# Patient Record
Sex: Female | Born: 1950 | Race: Black or African American | Hispanic: No | Smoking: Never smoker
Health system: Southern US, Community
[De-identification: ages and names within clinical notes are randomized; demographics above are authoritative.]

## PROBLEM LIST (undated history)

## (undated) DIAGNOSIS — E7800 Pure hypercholesterolemia, unspecified: Secondary | ICD-10-CM

## (undated) DIAGNOSIS — D219 Benign neoplasm of connective and other soft tissue, unspecified: Secondary | ICD-10-CM

## (undated) DIAGNOSIS — I10 Essential (primary) hypertension: Secondary | ICD-10-CM

## (undated) DIAGNOSIS — R7303 Prediabetes: Secondary | ICD-10-CM

## (undated) DIAGNOSIS — N950 Postmenopausal bleeding: Secondary | ICD-10-CM

## (undated) DIAGNOSIS — M1990 Unspecified osteoarthritis, unspecified site: Secondary | ICD-10-CM

## (undated) DIAGNOSIS — C801 Malignant (primary) neoplasm, unspecified: Secondary | ICD-10-CM

## (undated) DIAGNOSIS — C539 Malignant neoplasm of cervix uteri, unspecified: Secondary | ICD-10-CM

## (undated) HISTORY — PX: TUBAL LIGATION: SHX77

## (undated) HISTORY — DX: Pure hypercholesterolemia, unspecified: E78.00

## (undated) HISTORY — DX: Benign neoplasm of connective and other soft tissue, unspecified: D21.9

## (undated) HISTORY — DX: Malignant neoplasm of cervix uteri, unspecified: C53.9

## (undated) HISTORY — DX: Essential (primary) hypertension: I10

## (undated) HISTORY — DX: Postmenopausal bleeding: N95.0

## (undated) HISTORY — PX: REPLACEMENT TOTAL KNEE BILATERAL: SUR1225

## (undated) MED FILL — Fosaprepitant Dimeglumine For IV Infusion 150 MG (Base Eq): INTRAVENOUS | Qty: 5 | Status: AC

---

## 2008-12-03 ENCOUNTER — Emergency Department: Payer: Self-pay | Admitting: Unknown Physician Specialty

## 2008-12-04 ENCOUNTER — Ambulatory Visit: Payer: Self-pay

## 2008-12-21 ENCOUNTER — Ambulatory Visit: Payer: Self-pay

## 2009-02-20 ENCOUNTER — Ambulatory Visit: Payer: Self-pay | Admitting: Gastroenterology

## 2010-12-01 IMAGING — CR DG LUMBAR SPINE AP/LAT/OBLIQUES W/ FLEX AND EXT
1 series · 5 of 5 positions shown · non-contrast
Comparison: none

REASON FOR EXAM: low back pain
COMMENTS:

[Series 1: view not recorded · 0.17mm/px · 5 of 5 slices shown]
[im 1/5]
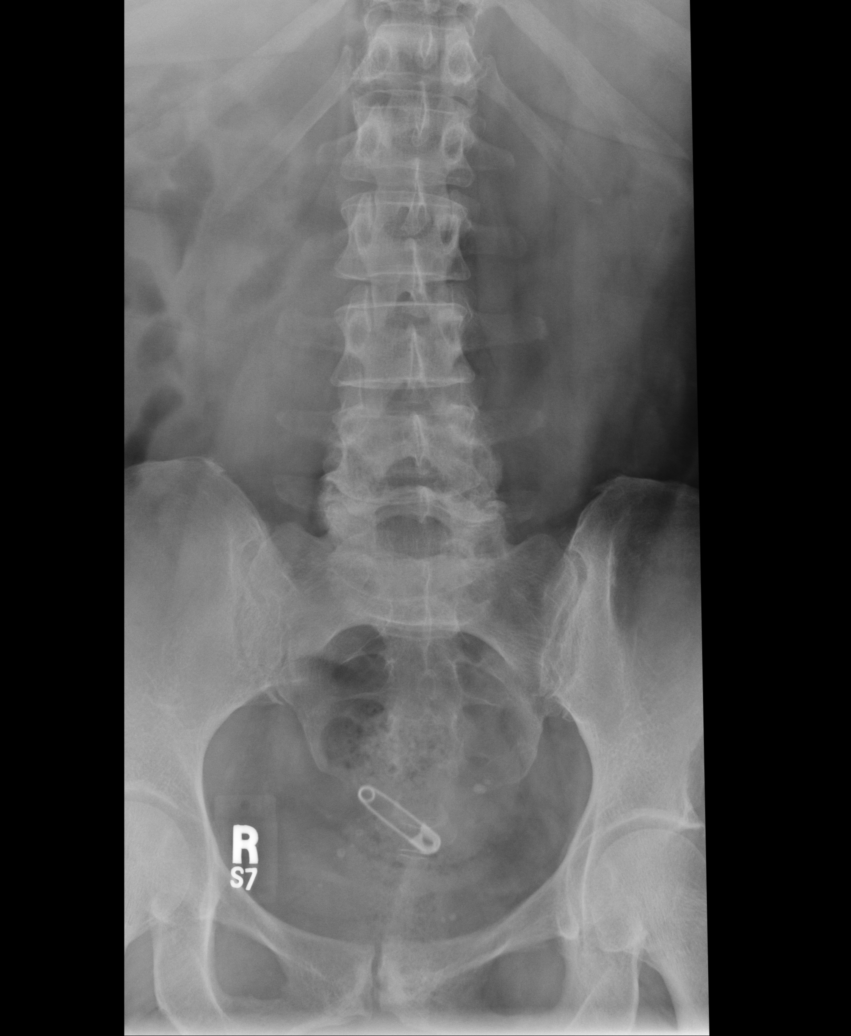
[im 2/5]
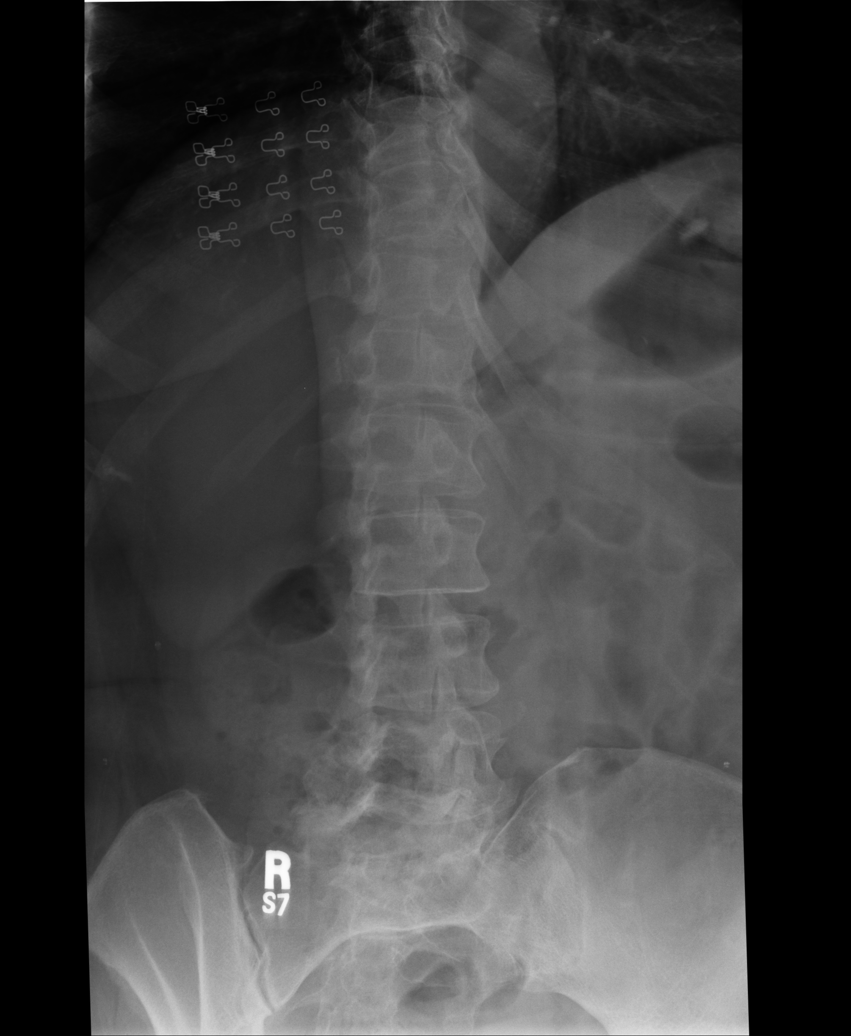
[im 3/5]
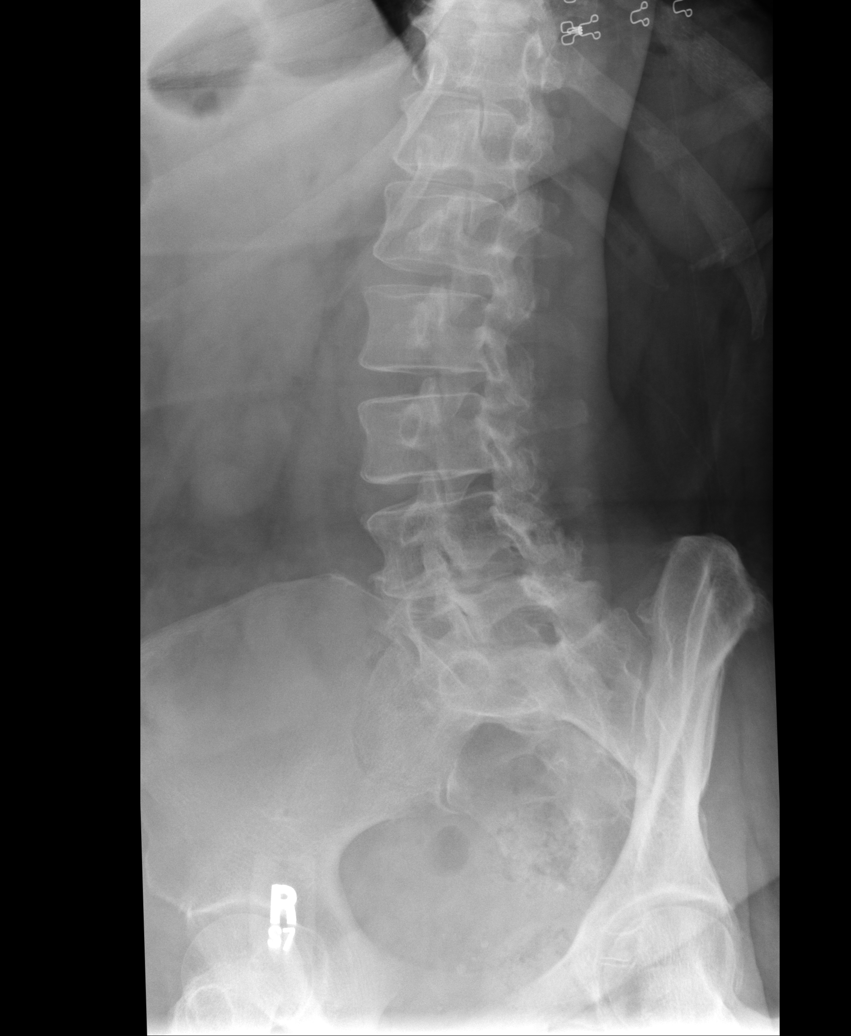
[im 4/5]
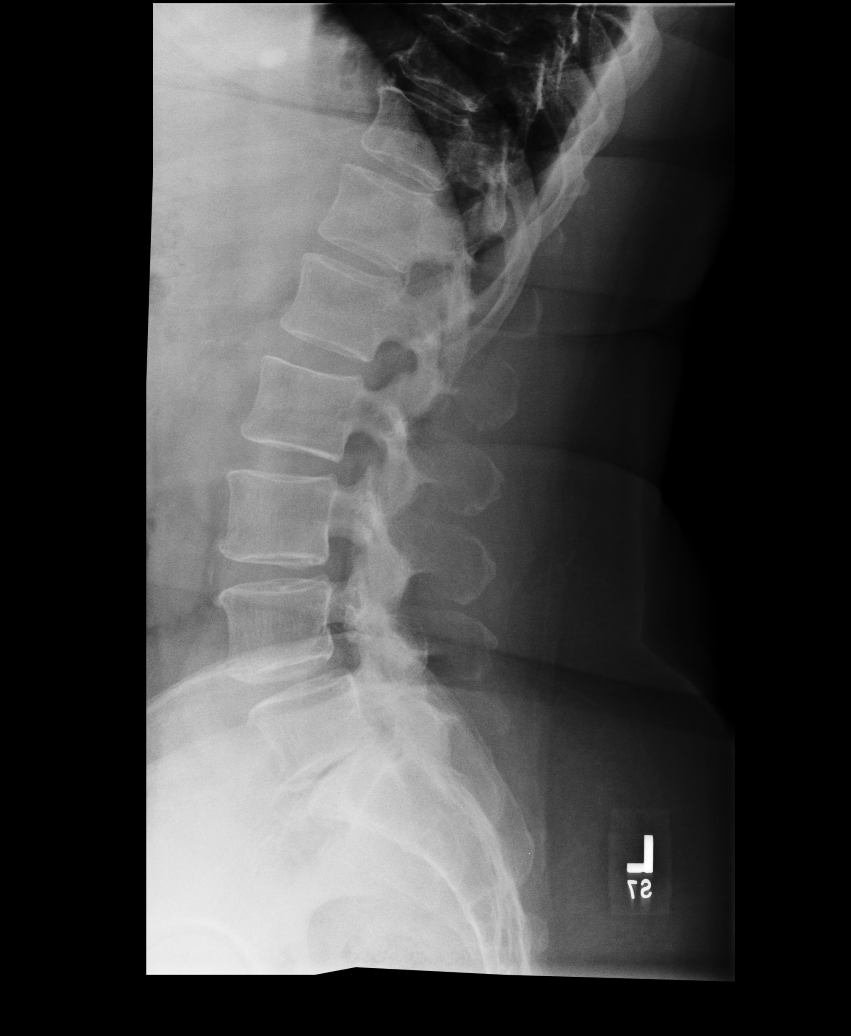
[im 5/5]
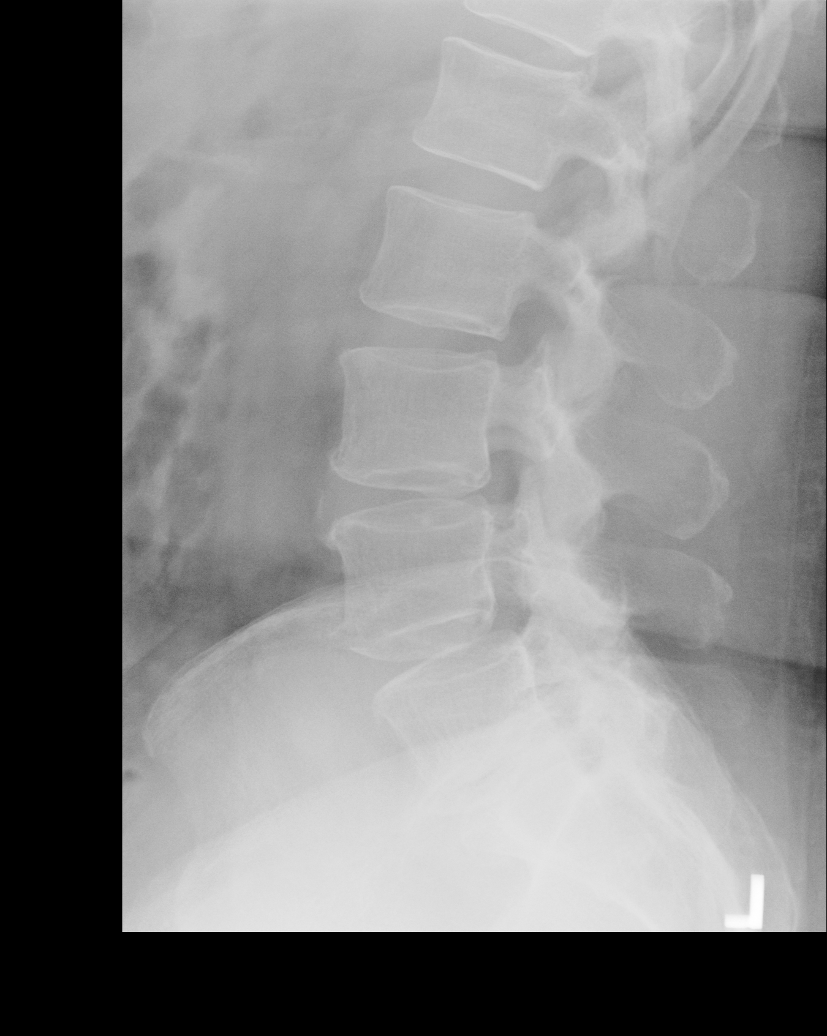

[5 of 5 positions shown; findings below may reference images not displayed]

PROCEDURE:     DXR - DXR LUMBAR SPINE WITH OBLIQUES  - December 04, 2008 [DATE]

RESULT:     The lumbar vertebral bodies are preserved in height. The lumbar
intervertebral disc space heights are reasonably well-maintained. There is
no evidence of spondylolisthesis. The pedicles and transverse processes
appear intact. As best as can be determined no pars defects are present.
Mild degenerative facet joint change at L4-L5 and L5-S1 is present. Mild
narrowing of the L5-S1 disc space is present.
IMPRESSION: I do not see objective evidence of an acute fracture of the
lumbar spine.

## 2017-09-02 DIAGNOSIS — M1711 Unilateral primary osteoarthritis, right knee: Secondary | ICD-10-CM | POA: Insufficient documentation

## 2017-09-14 DIAGNOSIS — T8484XA Pain due to internal orthopedic prosthetic devices, implants and grafts, initial encounter: Secondary | ICD-10-CM | POA: Insufficient documentation

## 2018-03-29 DIAGNOSIS — M1A0710 Idiopathic chronic gout, right ankle and foot, without tophus (tophi): Secondary | ICD-10-CM | POA: Insufficient documentation

## 2018-03-29 DIAGNOSIS — I10 Essential (primary) hypertension: Secondary | ICD-10-CM | POA: Insufficient documentation

## 2020-02-10 DIAGNOSIS — Z9289 Personal history of other medical treatment: Secondary | ICD-10-CM

## 2020-02-10 HISTORY — DX: Personal history of other medical treatment: Z92.89

## 2020-11-08 DIAGNOSIS — E7800 Pure hypercholesterolemia, unspecified: Secondary | ICD-10-CM | POA: Insufficient documentation

## 2020-11-08 DIAGNOSIS — R7303 Prediabetes: Secondary | ICD-10-CM | POA: Insufficient documentation

## 2020-12-23 DIAGNOSIS — Z1211 Encounter for screening for malignant neoplasm of colon: Secondary | ICD-10-CM | POA: Insufficient documentation

## 2021-08-11 HISTORY — PX: HYSTEROSCOPY WITH D & C: SHX1775

## 2021-08-11 HISTORY — PX: EXAMINATION UNDER ANESTHESIA: SHX1540

## 2021-08-19 ENCOUNTER — Telehealth: Payer: Self-pay | Admitting: *Deleted

## 2021-08-19 NOTE — Telephone Encounter (Signed)
Spoke with the patient's daughter and gave the appt date/time for 8/4. Explained that the office will call and move her appt up to either 7/13 or the week of 7/17.    Explained to the patient the the doctor will perform a pelvic exam at this visit. Patient given the policy that no visitors under the 16 yrs are a loud in the [place]. Patient given the address/phone number for the clinic and that the center offers free valet service.

## 2021-08-20 ENCOUNTER — Telehealth: Payer: Self-pay

## 2021-08-20 ENCOUNTER — Encounter: Payer: Self-pay | Admitting: Gynecologic Oncology

## 2021-08-20 NOTE — Telephone Encounter (Signed)
Received phone call from patients daughter confirming her upcoming appointment. Advised her that there is an appointment available at 08:15am tomorrow 08/21/21 with Dr. Berline Lopes. She is in agreement of appointment date and time. She has office address and phone and instructed to call with any needs.

## 2021-08-21 ENCOUNTER — Ambulatory Visit: Payer: Self-pay | Admitting: Gynecologic Oncology

## 2021-08-21 NOTE — H&P (View-Only) (Signed)
GYNECOLOGIC ONCOLOGY NEW PATIENT CONSULTATION   Patient Name: Sarah Wu  Patient Age: 71 y.o. Date of Service: 08/22/2021 Referring Provider: Luanne Bras [address] [place],  [place] [address]   Primary Care Provider: Tilden Fossa, MD Consulting Provider: Jeral Pinch, MD   Assessment/Plan:  Postmenopausal patient with carcinosarcoma.  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  Exam findings today are consistent with a prolapsing mass through the cervix.  We reviewed the nature of endometrial cancer and its recommended surgical staging, including total hysterectomy, bilateral salpingo-oophorectomy, and lymph node assessment. The patient is a suitable candidate for staging via a minimally invasive approach to surgery.  We reviewed that robotic assistance would be used to complete the surgery.   We discussed that most endometrial cancer is detected early, however, we reviewed that adjuvant therapy will likely be recommended based on the patient's biopsy, however, we will defer to final pathology results.    Given her high risk histology, I recommend CT scan preoperatively to rule out metastatic disease.  Given her difficult exam, I have also recommended pelvic ultrasound to better assess the size of the uterus and this prolapsing mass.  We reviewed the sentinel lymph node technique. Risks and benefits of sentinel lymph node biopsy was reviewed. We reviewed the technique and ICG dye. The patient DOES NOT have an iodine allergy or known liver dysfunction. We reviewed the false negative rate (0.4%), and that 3% of patients with metastatic disease will not have it detected by SLN biopsy in endometrial cancer. A low risk of allergic reaction to the dye, <0.2% for ICG, has been reported. We also discussed that in the case of failed mapping, which occurs 40% of the time, a bilateral or unilateral lymphadenectomy will be performed at the  surgeon's discretion.   Potential benefits of sentinel nodes including a higher detection rate for metastasis due to ultrastaging and potential reduction in operative morbidity. However, there remains uncertainty as to the role for treatment of micrometastatic disease. Further, the benefit of operative morbidity associated with the SLN technique in endometrial cancer is not yet completely known. In other patient populations (e.g. the cervical cancer population) there has been observed reductions in morbidity with SLN biopsy compared to pelvic lymphadenectomy. Lymphedema, nerve dysfunction and lymphocysts are all potential risks with the SLN technique as with complete lymphadenectomy. Additional risks to the patient include the risk of damage to an internal organ while operating in an altered view (e.g. the black and white image of the robotic fluorescence imaging mode).   We discussed the plan for a robotic assisted hysterectomy, bilateral salpingo-oophorectomy, sentinel lymph node evaluation, possible lymph node dissection, possible laparotomy. The risks of surgery were discussed in detail and she understands these to include infection; wound separation; hernia; vaginal cuff separation, injury to adjacent organs such as bowel, bladder, blood vessels, ureters and nerves; bleeding which may require blood transfusion; anesthesia risk; thromboembolic events; possible death; unforeseen complications; possible need for re-exploration; medical complications such as heart attack, stroke, pleural effusion and pneumonia; and, if full lymphadenectomy is performed the risk of lymphedema and lymphocyst. The patient will receive DVT and antibiotic prophylaxis as indicated. She voiced a clear understanding. She had the opportunity to ask questions. Perioperative instructions were reviewed with her. Prescriptions for post-op medications were sent to her pharmacy of choice.  A copy of this note was sent to the patient's  referring provider.   65 minutes of total time was spent for this patient encounter,  including preparation, face-to-face counseling with the patient and coordination of care, and documentation of the encounter.   Jeral Pinch, MD  Division of Gynecologic Oncology  Department of Obstetrics and Gynecology  [facility]  ___________________________________________  Chief Complaint: Chief Complaint  Patient presents with   Malignant neoplasm of cervix, unspecified site [facility])    History of Present Illness:  Sarah Wu is a 71 y.o. y.o. female who is seen in consultation at the request of Luanne Bras for an evaluation of carcinosarcoma.  Patient reports that menses stopped at some point for a period of time.  She is unsure when this happened or how long her menses stopped.  About a year ago, she began having bleeding again that she describes as monthly bleeding lasting for 6 or 7 days.  Last month, she bled for almost the entire month.  Her bleeding stopped after the exam under anesthesia.  She wears a medium size pad and changes it twice a day.  Per her report, it is not fully saturated.  She denies any associated cramping.  She does note some intermittent stomach pain which she describes as bloating.  Her appetite has been decreased for the last year and a half since her daughter passed away.  She denies any nausea or emesis.  She denies early satiety.  Reports normal bowel function.  Thinks she has some increased urinary frequency.  Patient initially was seen on 07/30/2021 in the setting of postmenopausal bleeding.  She reported having been seen a year previously and having a biopsy that was negative.  On exam in clinic, she was noted to have a mass present within the vagina.  This was thought to be a prolapsing fibroid and plan was made for exam under anesthesia, removal prolapsing fibroid with hysteroscopy and D&C.  FSH was 11.8 and estradiol  24.1.  Patient was taken to the operating room on 08/11/2021 for pelvic exam under anesthesia and cervical mass biopsy.  Patient was noted to have a large cervical mass extending almost into the vagina.  Mass extended into the endocervix on bimanual exam, uterus noted to be normal in size. Biopsy of cervical mass on 08/11/2021 consistent with carcinosarcoma.  Patient's grandson is currently living with her.  Medical history is notable for hypertension, hyperlipidemia, and prediabetes.  Her last hemoglobin A1c was 5.3%.  She has chronic gout as well as arthritis.  Surgical history notable for tubal ligation.  PAST MEDICAL HISTORY:  Past Medical History:  Diagnosis Date   Fibroid tumor    Hx of mammogram 2022   [place]   Hypercholesteremia    Hypertension    Post-menopausal bleeding      PAST SURGICAL HISTORY:  Past Surgical History:  Procedure Laterality Date   EXAMINATION UNDER ANESTHESIA  08/11/2021   with cervical mass biopsies   REPLACEMENT TOTAL KNEE BILATERAL     TUBAL LIGATION      OB/GYN HISTORY:  OB History  Gravida Para Term Preterm AB Living  '6 6       5  '$ SAB IAB Ectopic Multiple Live Births               # Outcome Date GA Lbr Len/2nd Weight Sex Delivery Anes PTL Lv  6 Para           5 Para           4 Para           3 Para  2 Para           1 Para             No LMP recorded.  Age at menarche: 62 Age at menopause: Unsure, see HPI Hx of HRT: Denies Hx of STDs: Denies Last pap: 2022 - normal per patient History of abnormal pap smears: Denies  SCREENING STUDIES:  Last mammogram: 2022  Last colonoscopy: 2023 Last bone mineral density: 2023  MEDICATIONS: Outpatient Encounter Medications as of 08/22/2021  Medication Sig   allopurinol (ZYLOPRIM) 300 MG tablet Take 300 mg by mouth daily.   amLODipine (NORVASC) 5 MG tablet Take 5 mg by mouth daily.   atenolol (TENORMIN) 50 MG tablet Take 50 mg by mouth daily.   atorvastatin (LIPITOR) 10 MG  tablet Take 10 mg by mouth daily.   cholecalciferol (VITAMIN D3) 25 MCG (1000 UNIT) tablet Take 1,000 Units by mouth daily.   hydrochlorothiazide (HYDRODIURIL) 12.5 MG tablet Take 12.5 mg by mouth daily.   meloxicam (MOBIC) 7.5 MG tablet Take 7.5 mg by mouth daily.   potassium chloride (KLOR-CON) 10 MEQ tablet Take 10 mEq by mouth daily.   No facility-administered encounter medications on file as of 08/22/2021.    ALLERGIES:  No Known Allergies   FAMILY HISTORY:  Family History  Problem Relation Age of Onset   Breast cancer Sister        half-sister     SOCIAL HISTORY:  Social Connections: Not on file    REVIEW OF SYSTEMS:  + Urinary frequency, vaginal bleeding Denies appetite changes, fevers, chills, fatigue, unexplained weight changes. Denies hearing loss, neck lumps or masses, mouth sores, ringing in ears or voice changes. Denies cough or wheezing.  Denies shortness of breath. Denies chest pain or palpitations. Denies leg swelling. Denies abdominal distention, pain, blood in stools, constipation, diarrhea, nausea, vomiting, or early satiety. Denies pain with intercourse, dysuria, hematuria or incontinence. Denies hot flashes, pelvic pain or vaginal discharge.   Denies joint pain, back pain or muscle pain/cramps. Denies itching, rash, or wounds. Denies dizziness, headaches, numbness or seizures. Denies swollen lymph nodes or glands, denies easy bruising or bleeding. Denies anxiety, depression, confusion, or decreased concentration.  Physical Exam:  Vital Signs for this encounter:  Blood pressure 128/85, pulse 81, temperature 97.9 F (36.6 C), temperature source Oral, resp. rate 16, height '5\' 8"'$  (1.727 m), weight 220 lb 6.4 oz (100 kg), SpO2 98 %. Body mass index is 33.51 kg/m. General: Alert, oriented, no acute distress.  HEENT: Normocephalic, atraumatic. Sclera anicteric.  Chest: Clear to auscultation bilaterally. No wheezes, rhonchi, or rales. Cardiovascular: Regular  rate and rhythm, no murmurs, rubs, or gallops.  Abdomen: Obese. Normoactive bowel sounds. Soft, nondistended, nontender to palpation. No masses or hepatosplenomegaly appreciated. No palpable fluid wave.  Extremities: Grossly normal range of motion. Warm, well perfused. No edema bilaterally.  Skin: No rashes or lesions.  Lymphatics: No cervical, supraclavicular, or inguinal adenopathy.  GU:  Normal external female genitalia. No lesions. No discharge or bleeding.             Bladder/urethra:  No lesions or masses, well supported bladder             Vagina: Mildly atrophic, somewhat redundant vaginal tissue.             Cervix: Normal appearing, no lesions.  Visually dilated with prolapsing mass seen within the os.  Bimanual exam, cervix itself is normal, dilated approximately 2 cm with soft tissue appreciated within  the os.             Uterus: Somewhat limited by body habitus, minimally-moderately mobile, 8 cm, no parametrial involvement or nodularity.             Adnexa: No masses appreciated.  Rectal: Deferred.  LABORATORY AND RADIOLOGIC DATA:  Outside medical records were reviewed to synthesize the above history, along with the history and physical obtained during the visit.   No results found for: "WBC", "HGB", "HCT", "PLT", "GLUCOSE", "CHOL", "TRIG", "HDL", "LDLDIRECT", "[place]", "ALT", "AST", "NA", "K", "CL", "CREATININE", "BUN", "CO2", "TSH", "PSA", "INR", "GLUF", "HGBA1C", "MICROALBUR"

## 2021-08-21 NOTE — Progress Notes (Signed)
GYNECOLOGIC ONCOLOGY NEW PATIENT CONSULTATION   Patient Name: Sarah Wu  Patient Age: 71 y.o. Date of Service: 08/22/2021 Referring Provider: Luanne Wu [address] [place],  [place] [address]   Primary Care Provider: Tilden Fossa, MD Consulting Provider: Jeral Pinch, MD   Assessment/Plan:  Postmenopausal patient with carcinosarcoma.  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  Exam findings today are consistent with a prolapsing mass through the cervix.  We reviewed the nature of endometrial cancer and its recommended surgical staging, including total hysterectomy, bilateral salpingo-oophorectomy, and lymph node assessment. The patient is a suitable candidate for staging via a minimally invasive approach to surgery.  We reviewed that robotic assistance would be used to complete the surgery.   We discussed that most endometrial cancer is detected early, however, we reviewed that adjuvant therapy will likely be recommended based on the patient's biopsy, however, we will defer to final pathology results.    Given her high risk histology, I recommend CT scan preoperatively to rule out metastatic disease.  Given her difficult exam, I have also recommended pelvic ultrasound to better assess the size of the uterus and this prolapsing mass.  We reviewed the sentinel lymph node technique. Risks and benefits of sentinel lymph node biopsy was reviewed. We reviewed the technique and ICG dye. The patient DOES NOT have an iodine allergy or known liver dysfunction. We reviewed the false negative rate (0.4%), and that 3% of patients with metastatic disease will not have it detected by SLN biopsy in endometrial cancer. A low risk of allergic reaction to the dye, <0.2% for ICG, has been reported. We also discussed that in the case of failed mapping, which occurs 40% of the time, a bilateral or unilateral lymphadenectomy will be performed at the  surgeon's discretion.   Potential benefits of sentinel nodes including a higher detection rate for metastasis due to ultrastaging and potential reduction in operative morbidity. However, there remains uncertainty as to the role for treatment of micrometastatic disease. Further, the benefit of operative morbidity associated with the SLN technique in endometrial cancer is not yet completely known. In other patient populations (e.g. the cervical cancer population) there has been observed reductions in morbidity with SLN biopsy compared to pelvic lymphadenectomy. Lymphedema, nerve dysfunction and lymphocysts are all potential risks with the SLN technique as with complete lymphadenectomy. Additional risks to the patient include the risk of damage to an internal organ while operating in an altered view (e.g. the black and white image of the robotic fluorescence imaging mode).   We discussed the plan for a robotic assisted hysterectomy, bilateral salpingo-oophorectomy, sentinel lymph node evaluation, possible lymph node dissection, possible laparotomy. The risks of surgery were discussed in detail and she understands these to include infection; wound separation; hernia; vaginal cuff separation, injury to adjacent organs such as bowel, bladder, blood vessels, ureters and nerves; bleeding which may require blood transfusion; anesthesia risk; thromboembolic events; possible death; unforeseen complications; possible need for re-exploration; medical complications such as heart attack, stroke, pleural effusion and pneumonia; and, if full lymphadenectomy is performed the risk of lymphedema and lymphocyst. The patient will receive DVT and antibiotic prophylaxis as indicated. She voiced a clear understanding. She had the opportunity to ask questions. Perioperative instructions were reviewed with her. Prescriptions for post-op medications were sent to her pharmacy of choice.  A copy of this note was sent to the patient's  referring provider.   65 minutes of total time was spent for this patient encounter,  including preparation, face-to-face counseling with the patient and coordination of care, and documentation of the encounter.   Sarah Pinch, MD  Division of Gynecologic Oncology  Department of Obstetrics and Gynecology  [facility]  ___________________________________________  Chief Complaint: Chief Complaint  Patient presents with   Malignant neoplasm of cervix, unspecified site [facility])    History of Present Illness:  Sarah Wu is a 71 y.o. y.o. female who is seen in consultation at the request of Sarah Wu for an evaluation of carcinosarcoma.  Patient reports that menses stopped at some point for a period of time.  She is unsure when this happened or how long her menses stopped.  About a year ago, she began having bleeding again that she describes as monthly bleeding lasting for 6 or 7 days.  Last month, she bled for almost the entire month.  Her bleeding stopped after the exam under anesthesia.  She wears a medium size pad and changes it twice a day.  Per her report, it is not fully saturated.  She denies any associated cramping.  She does note some intermittent stomach pain which she describes as bloating.  Her appetite has been decreased for the last year and a half since her daughter passed away.  She denies any nausea or emesis.  She denies early satiety.  Reports normal bowel function.  Thinks she has some increased urinary frequency.  Patient initially was seen on 07/30/2021 in the setting of postmenopausal bleeding.  She reported having been seen a year previously and having a biopsy that was negative.  On exam in clinic, she was noted to have a mass present within the vagina.  This was thought to be a prolapsing fibroid and plan was made for exam under anesthesia, removal prolapsing fibroid with hysteroscopy and D&C.  FSH was 11.8 and estradiol  24.1.  Patient was taken to the operating room on 08/11/2021 for pelvic exam under anesthesia and cervical mass biopsy.  Patient was noted to have a large cervical mass extending almost into the vagina.  Mass extended into the endocervix on bimanual exam, uterus noted to be normal in size. Biopsy of cervical mass on 08/11/2021 consistent with carcinosarcoma.  Patient's grandson is currently living with her.  Medical history is notable for hypertension, hyperlipidemia, and prediabetes.  Her last hemoglobin A1c was 5.3%.  She has chronic gout as well as arthritis.  Surgical history notable for tubal ligation.  PAST MEDICAL HISTORY:  Past Medical History:  Diagnosis Date   Fibroid tumor    Hx of mammogram 2022   [place]   Hypercholesteremia    Hypertension    Post-menopausal bleeding      PAST SURGICAL HISTORY:  Past Surgical History:  Procedure Laterality Date   EXAMINATION UNDER ANESTHESIA  08/11/2021   with cervical mass biopsies   REPLACEMENT TOTAL KNEE BILATERAL     TUBAL LIGATION      OB/GYN HISTORY:  OB History  Gravida Para Term Preterm AB Living  '6 6       5  '$ SAB IAB Ectopic Multiple Live Births               # Outcome Date GA Lbr Len/2nd Weight Sex Delivery Anes PTL Lv  6 Para           5 Para           4 Para           3 Para  2 Para           1 Para             No LMP recorded.  Age at menarche: 49 Age at menopause: Unsure, see HPI Hx of HRT: Denies Hx of STDs: Denies Last pap: 2022 - normal per patient History of abnormal pap smears: Denies  SCREENING STUDIES:  Last mammogram: 2022  Last colonoscopy: 2023 Last bone mineral density: 2023  MEDICATIONS: Outpatient Encounter Medications as of 08/22/2021  Medication Sig   allopurinol (ZYLOPRIM) 300 MG tablet Take 300 mg by mouth daily.   amLODipine (NORVASC) 5 MG tablet Take 5 mg by mouth daily.   atenolol (TENORMIN) 50 MG tablet Take 50 mg by mouth daily.   atorvastatin (LIPITOR) 10 MG  tablet Take 10 mg by mouth daily.   cholecalciferol (VITAMIN D3) 25 MCG (1000 UNIT) tablet Take 1,000 Units by mouth daily.   hydrochlorothiazide (HYDRODIURIL) 12.5 MG tablet Take 12.5 mg by mouth daily.   meloxicam (MOBIC) 7.5 MG tablet Take 7.5 mg by mouth daily.   potassium chloride (KLOR-CON) 10 MEQ tablet Take 10 mEq by mouth daily.   No facility-administered encounter medications on file as of 08/22/2021.    ALLERGIES:  No Known Allergies   FAMILY HISTORY:  Family History  Problem Relation Age of Onset   Breast cancer Sister        half-sister     SOCIAL HISTORY:  Social Connections: Not on file    REVIEW OF SYSTEMS:  + Urinary frequency, vaginal bleeding Denies appetite changes, fevers, chills, fatigue, unexplained weight changes. Denies hearing loss, neck lumps or masses, mouth sores, ringing in ears or voice changes. Denies cough or wheezing.  Denies shortness of breath. Denies chest pain or palpitations. Denies leg swelling. Denies abdominal distention, pain, blood in stools, constipation, diarrhea, nausea, vomiting, or early satiety. Denies pain with intercourse, dysuria, hematuria or incontinence. Denies hot flashes, pelvic pain or vaginal discharge.   Denies joint pain, back pain or muscle pain/cramps. Denies itching, rash, or wounds. Denies dizziness, headaches, numbness or seizures. Denies swollen lymph nodes or glands, denies easy bruising or bleeding. Denies anxiety, depression, confusion, or decreased concentration.  Physical Exam:  Vital Signs for this encounter:  Blood pressure 128/85, pulse 81, temperature 97.9 F (36.6 C), temperature source Oral, resp. rate 16, height '5\' 8"'$  (1.727 m), weight 220 lb 6.4 oz (100 kg), SpO2 98 %. Body mass index is 33.51 kg/m. General: Alert, oriented, no acute distress.  HEENT: Normocephalic, atraumatic. Sclera anicteric.  Chest: Clear to auscultation bilaterally. No wheezes, rhonchi, or rales. Cardiovascular: Regular  rate and rhythm, no murmurs, rubs, or gallops.  Abdomen: Obese. Normoactive bowel sounds. Soft, nondistended, nontender to palpation. No masses or hepatosplenomegaly appreciated. No palpable fluid wave.  Extremities: Grossly normal range of motion. Warm, well perfused. No edema bilaterally.  Skin: No rashes or lesions.  Lymphatics: No cervical, supraclavicular, or inguinal adenopathy.  GU:  Normal external female genitalia. No lesions. No discharge or bleeding.             Bladder/urethra:  No lesions or masses, well supported bladder             Vagina: Mildly atrophic, somewhat redundant vaginal tissue.             Cervix: Normal appearing, no lesions.  Visually dilated with prolapsing mass seen within the os.  Bimanual exam, cervix itself is normal, dilated approximately 2 cm with soft tissue appreciated within  the os.             Uterus: Somewhat limited by body habitus, minimally-moderately mobile, 8 cm, no parametrial involvement or nodularity.             Adnexa: No masses appreciated.  Rectal: Deferred.  LABORATORY AND RADIOLOGIC DATA:  Outside medical records were reviewed to synthesize the above history, along with the history and physical obtained during the visit.   No results found for: "WBC", "HGB", "HCT", "PLT", "GLUCOSE", "CHOL", "TRIG", "HDL", "LDLDIRECT", "[place]", "ALT", "AST", "NA", "K", "CL", "CREATININE", "BUN", "CO2", "TSH", "PSA", "INR", "GLUF", "HGBA1C", "MICROALBUR"

## 2021-08-22 ENCOUNTER — Encounter: Payer: Self-pay | Admitting: Gynecologic Oncology

## 2021-08-22 ENCOUNTER — Inpatient Hospital Stay: Payer: Medicare Other | Attending: Gynecologic Oncology | Admitting: Gynecologic Oncology

## 2021-08-22 ENCOUNTER — Inpatient Hospital Stay (HOSPITAL_BASED_OUTPATIENT_CLINIC_OR_DEPARTMENT_OTHER): Payer: Medicare Other | Admitting: Gynecologic Oncology

## 2021-08-22 ENCOUNTER — Other Ambulatory Visit: Payer: Self-pay

## 2021-08-22 ENCOUNTER — Inpatient Hospital Stay: Payer: Medicare Other

## 2021-08-22 ENCOUNTER — Telehealth: Payer: Self-pay | Admitting: *Deleted

## 2021-08-22 VITALS — BP 128/85 | HR 81 | Temp 97.9°F | Resp 16 | Ht 68.0 in | Wt 220.4 lb

## 2021-08-22 DIAGNOSIS — Z79899 Other long term (current) drug therapy: Secondary | ICD-10-CM | POA: Diagnosis not present

## 2021-08-22 DIAGNOSIS — I10 Essential (primary) hypertension: Secondary | ICD-10-CM | POA: Diagnosis not present

## 2021-08-22 DIAGNOSIS — C539 Malignant neoplasm of cervix uteri, unspecified: Secondary | ICD-10-CM | POA: Diagnosis not present

## 2021-08-22 DIAGNOSIS — C55 Malignant neoplasm of uterus, part unspecified: Secondary | ICD-10-CM | POA: Diagnosis not present

## 2021-08-22 DIAGNOSIS — C801 Malignant (primary) neoplasm, unspecified: Secondary | ICD-10-CM

## 2021-08-22 DIAGNOSIS — Z791 Long term (current) use of non-steroidal anti-inflammatories (NSAID): Secondary | ICD-10-CM | POA: Insufficient documentation

## 2021-08-22 DIAGNOSIS — M1990 Unspecified osteoarthritis, unspecified site: Secondary | ICD-10-CM | POA: Diagnosis not present

## 2021-08-22 DIAGNOSIS — N939 Abnormal uterine and vaginal bleeding, unspecified: Secondary | ICD-10-CM | POA: Insufficient documentation

## 2021-08-22 DIAGNOSIS — Z6833 Body mass index (BMI) 33.0-33.9, adult: Secondary | ICD-10-CM

## 2021-08-22 DIAGNOSIS — M1A9XX0 Chronic gout, unspecified, without tophus (tophi): Secondary | ICD-10-CM | POA: Insufficient documentation

## 2021-08-22 DIAGNOSIS — E7800 Pure hypercholesterolemia, unspecified: Secondary | ICD-10-CM | POA: Insufficient documentation

## 2021-08-22 DIAGNOSIS — R350 Frequency of micturition: Secondary | ICD-10-CM | POA: Diagnosis not present

## 2021-08-22 DIAGNOSIS — E669 Obesity, unspecified: Secondary | ICD-10-CM | POA: Insufficient documentation

## 2021-08-22 LAB — BASIC METABOLIC PANEL - CANCER CENTER ONLY
Anion gap: 8 (ref 5–15)
BUN: 11 mg/dL (ref 8–23)
CO2: 29 mmol/L (ref 22–32)
Calcium: 9.4 mg/dL (ref 8.9–10.3)
Chloride: 102 mmol/L (ref 98–111)
Creatinine: 0.9 mg/dL (ref 0.44–1.00)
GFR, Estimated: >60 mL/min (ref >60)
Glucose, Bld: 109 mg/dL — ABNORMAL HIGH (ref 70–99)
Potassium: 3.4 mmol/L — ABNORMAL LOW (ref 3.5–5.1)
Sodium: 139 mmol/L (ref 135–145)

## 2021-08-22 MED ORDER — TRAMADOL HCL 50 MG PO TABS: 50.0000 mg | ORAL_TABLET | Freq: Four times a day (QID) | ORAL | 0 refills | Status: DC | PRN

## 2021-08-22 MED ORDER — SENNOSIDES-DOCUSATE SODIUM 8.6-50 MG PO TABS: 2.0000 | ORAL_TABLET | Freq: Every day | ORAL | 0 refills | Status: DC

## 2021-08-22 NOTE — Patient Instructions (Addendum)
Plan to have a CT scan and an ultrasound prior to having surgery. You will be contacted with the results.  Preparing for your Surgery  Plan for surgery on September 10, 2021 with Dr. Jeral Pinch at [place] will be scheduled for robotic assisted total laparoscopic hysterectomy (removal of the uterus and cervix), bilateral salpingo-oophorectomy (removal of both ovaries and fallopian tubes), sentinel lymph node biopsy, possible lymph node dissection, possible laparotomy (larger incision on your abdomen if needed).   If we get a sooner opening, we will contact you about moving your surgery to a closer date but we will need to make sure you have had your scans before surgery.   Pre-operative Testing -You will receive a phone call from presurgical testing at [facility] to arrange for a pre-operative appointment and lab work.  -Bring your insurance card, copy of an advanced directive if applicable, medication list  -At that visit, you will be asked to sign a consent for a possible blood transfusion in case a transfusion becomes necessary during surgery.  The need for a blood transfusion is rare but having consent is a necessary part of your care.     -You should not be taking blood thinners or aspirin at least ten days prior to surgery unless instructed by your surgeon. YOU WILL NEED TO STOP TAKING MOBIC AT LEAST 10 DAYS BEFORE SURGERY. IT IS SAFE TO USE TYLENOL IF NEEDED.  -Do not take supplements such as fish oil (omega 3), red yeast rice, turmeric before your surgery. You want to avoid medications with aspirin in them including headache powders such as BC or Goody's), Excedrin migraine.  Day Before Surgery at [place] will be asked to take in a light diet the day before surgery. You will be advised you can have clear liquids up until 3 hours before your surgery.    Eat a light diet the day before surgery.  Examples including soups, broths, toast, yogurt, mashed  potatoes.  AVOID GAS PRODUCING FOODS. Things to avoid include carbonated beverages (fizzy beverages, sodas), raw fruits and raw vegetables (uncooked), or beans.   If your bowels are filled with gas, your surgeon will have difficulty visualizing your pelvic organs which increases your surgical risks.  Your role in recovery Your role is to become active as soon as directed by your doctor, while still giving yourself time to heal.  Rest when you feel tired. You will be asked to do the following in order to speed your recovery:  - Cough and breathe deeply. This helps to clear and expand your lungs and can prevent pneumonia after surgery.  - [place]. Do mild physical activity. Walking or moving your legs help your circulation and body functions return to normal. Do not try to get up or walk alone the first time after surgery.   -If you develop swelling on one leg or the other, pain in the back of your leg, redness/warmth in one of your legs, please call the office or go to the Emergency Room to have a doppler to rule out a blood clot. For shortness of breath, chest pain-seek care in the Emergency Room as soon as possible. - Actively manage your pain. Managing your pain lets you move in comfort. We will ask you to rate your pain on a scale of zero to 10. It is your responsibility to tell your doctor or nurse where and how much you hurt so your pain can be  treated.  Special Considerations -If you are diabetic, you may be placed on insulin after surgery to have closer control over your blood sugars to promote healing and recovery.  This does not mean that you will be discharged on insulin.  If applicable, your oral antidiabetics will be resumed when you are tolerating a solid diet.  -Your final pathology results from surgery should be available around one week after surgery and the results will be relayed to you when available.  -FMLA forms can be faxed to [phone] and please allow  5-7 business days for completion.  Pain Management After Surgery -You have been prescribed your pain medication and bowel regimen medications before surgery so that you can have these available when you are discharged from the hospital. The pain medication is for use ONLY AFTER surgery and a new prescription will not be given.   -Make sure that you have Tylenol and Ibuprofen (OR MOBIC, DO NOT TAKE TOGETHER) IF YOU ARE ABLE TO TAKE THESE MEDICATIONS at home to use on a regular basis after surgery for pain control. We recommend alternating the medications every hour to six hours since they work differently and are processed in the body differently for pain relief.  -Review the attached handout on narcotic use and their risks and side effects.   Bowel Regimen -You have been prescribed Sennakot-S to take nightly to prevent constipation especially if you are taking the narcotic pain medication intermittently.  It is important to prevent constipation and drink adequate amounts of liquids. You can stop taking this medication when you are not taking pain medication and you are back on your normal bowel routine.  Risks of Surgery Risks of surgery are low but include bleeding, infection, damage to surrounding structures, re-operation, blood clots, and very rarely death.   Blood Transfusion Information (For the consent to be signed before surgery)  We will be checking your blood type before surgery so in case of emergencies, we will know what type of blood you would need.                                            WHAT IS A BLOOD TRANSFUSION?  A transfusion is the replacement of blood or some of its parts. Blood is made up of multiple cells which provide different functions. Red blood cells carry oxygen and are used for blood loss replacement. White blood cells fight against infection. Platelets control bleeding. Plasma helps clot blood. Other blood products are available for specialized needs, such  as hemophilia or other clotting disorders. BEFORE THE TRANSFUSION  Who gives blood for transfusions?  You may be able to donate blood to be used at a later date on yourself (autologous donation). Relatives can be asked to donate blood. This is generally not any safer than if you have received blood from a stranger. The same precautions are taken to ensure safety when a relative's blood is donated. Healthy volunteers who are fully evaluated to make sure their blood is safe. This is blood bank blood. Transfusion therapy is the safest it has ever been in the practice of medicine. Before blood is taken from a donor, a complete history is taken to make sure that person has no history of diseases nor engages in risky social behavior (examples are intravenous drug use or sexual activity with multiple partners). The donor's travel history is screened to  minimize risk of transmitting infections, such as malaria. The donated blood is tested for signs of infectious diseases, such as HIV and hepatitis. The blood is then tested to be sure it is compatible with you in order to minimize the chance of a transfusion reaction. If you or a relative donates blood, this is often done in anticipation of surgery and is not appropriate for emergency situations. It takes many days to process the donated blood. RISKS AND COMPLICATIONS Although transfusion therapy is very safe and saves many lives, the main dangers of transfusion include:  Getting an infectious disease. Developing a transfusion reaction. This is an allergic reaction to something in the blood you were given. Every precaution is taken to prevent this. The decision to have a blood transfusion has been considered carefully by your caregiver before blood is given. Blood is not given unless the benefits outweigh the risks.  AFTER SURGERY INSTRUCTIONS  Return to work: 4-6 weeks if applicable  Activity: 1. Be up and out of the bed during the day.  Take a nap if  needed.  You may walk up steps but be careful and use the hand rail.  Stair climbing will tire you more than you think, you may need to stop part way and rest.   2. No lifting or straining for 6 weeks over 10 pounds. No pushing, pulling, straining for 6 weeks.  3. No driving for around 1 week(s).  Do not drive if you are taking narcotic pain medicine and make sure that your reaction time has returned.   4. You can shower as soon as the next day after surgery. Shower daily.  Use your regular soap and water (not directly on the incision) and pat your incision(s) dry afterwards; don't rub.  No tub baths or submerging your body in water until cleared by your surgeon. If you have the soap that was given to you by pre-surgical testing that was used before surgery, you do not need to use it afterwards because this can irritate your incisions.   5. No sexual activity and nothing in the vagina for 8 weeks.  6. You may experience a small amount of clear drainage from your incisions, which is normal.  If the drainage persists, increases, or changes color please call the office.  7. Do not use creams, lotions, or ointments such as neosporin on your incisions after surgery until advised by your surgeon because they can cause removal of the dermabond glue on your incisions.    8. You may experience vaginal spotting after surgery or around the 6-8 week mark from surgery when the stitches at the top of the vagina begin to dissolve.  The spotting is normal but if you experience heavy bleeding, call our office.  9. Take Tylenol or ibuprofen (OR MOBIC, DO NOT TAKE TOGETHER) first for pain if you are able to take these medications and only use narcotic pain medication for severe pain not relieved by the Tylenol or Ibuprofen.  Monitor your Tylenol intake to a max of 4,000 mg in a 24 hour period. You can alternate these medications after surgery.  Diet: 1. Low sodium Heart Healthy Diet is recommended but you are  cleared to resume your normal (before surgery) diet after your procedure.  2. It is safe to use a laxative, such as Miralax or Colace, if you have difficulty moving your bowels. You have been prescribed Sennakot-S to take at bedtime every evening after surgery to keep bowel movements regular and to  prevent constipation.    Wound Care: 1. Keep clean and dry.  Shower daily.  Reasons to call the Doctor: Fever - Oral temperature greater than 100.4 degrees Fahrenheit Foul-smelling vaginal discharge Difficulty urinating Nausea and vomiting Increased pain at the site of the incision that is unrelieved with pain medicine. Difficulty breathing with or without chest pain New calf pain especially if only on one side Sudden, continuing increased vaginal bleeding with or without clots.   Contacts: For questions or concerns you should contact:  Dr. Jeral Pinch at [phone]  Joylene John, NP at [phone]  After Hours: call [phone] and have the GYN Oncologist paged/contacted (after 5 pm or on the weekends).  Messages sent via mychart are for non-urgent matters and are not responded to after hours so for urgent needs, please call the after hours number.

## 2021-08-22 NOTE — Patient Instructions (Signed)
Plan to have a CT scan and an ultrasound prior to having surgery. You will be contacted with the results.   Preparing for your Surgery   Plan for surgery on September 10, 2021 with Dr. Jeral Pinch at [place] will be scheduled for robotic assisted total laparoscopic hysterectomy (removal of the uterus and cervix), bilateral salpingo-oophorectomy (removal of both ovaries and fallopian tubes), sentinel lymph node biopsy, possible lymph node dissection, possible laparotomy (larger incision on your abdomen if needed).    If we get a sooner opening, we will contact you about moving your surgery to a closer date but we will need to make sure you have had your scans before surgery.    Pre-operative Testing -You will receive a phone call from presurgical testing at [facility] to arrange for a pre-operative appointment and lab work.   -Bring your insurance card, copy of an advanced directive if applicable, medication list   -At that visit, you will be asked to sign a consent for a possible blood transfusion in case a transfusion becomes necessary during surgery.  The need for a blood transfusion is rare but having consent is a necessary part of your care.      -You should not be taking blood thinners or aspirin at least ten days prior to surgery unless instructed by your surgeon. YOU WILL NEED TO STOP TAKING MOBIC AT LEAST 10 DAYS BEFORE SURGERY. IT IS SAFE TO USE TYLENOL IF NEEDED.   -Do not take supplements such as fish oil (omega 3), red yeast rice, turmeric before your surgery. You want to avoid medications with aspirin in them including headache powders such as BC or Goody's), Excedrin migraine.   Day Before Surgery at [place] will be asked to take in a light diet the day before surgery. You will be advised you can have clear liquids up until 3 hours before your surgery.     Eat a light diet the day before surgery.  Examples including soups, broths, toast, yogurt,  mashed potatoes.  AVOID GAS PRODUCING FOODS. Things to avoid include carbonated beverages (fizzy beverages, sodas), raw fruits and raw vegetables (uncooked), or beans.    If your bowels are filled with gas, your surgeon will have difficulty visualizing your pelvic organs which increases your surgical risks.   Your role in recovery Your role is to become active as soon as directed by your doctor, while still giving yourself time to heal.  Rest when you feel tired. You will be asked to do the following in order to speed your recovery:   - Cough and breathe deeply. This helps to clear and expand your lungs and can prevent pneumonia after surgery.  - [place]. Do mild physical activity. Walking or moving your legs help your circulation and body functions return to normal. Do not try to get up or walk alone the first time after surgery.   -If you develop swelling on one leg or the other, pain in the back of your leg, redness/warmth in one of your legs, please call the office or go to the Emergency Room to have a doppler to rule out a blood clot. For shortness of breath, chest pain-seek care in the Emergency Room as soon as possible. - Actively manage your pain. Managing your pain lets you move in comfort. We will ask you to rate your pain on a scale of zero to 10. It is your responsibility to tell your doctor  or nurse where and how much you hurt so your pain can be treated.   Special Considerations -If you are diabetic, you may be placed on insulin after surgery to have closer control over your blood sugars to promote healing and recovery.  This does not mean that you will be discharged on insulin.  If applicable, your oral antidiabetics will be resumed when you are tolerating a solid diet.   -Your final pathology results from surgery should be available around one week after surgery and the results will be relayed to you when available.   -FMLA forms can be faxed to [phone] and  please allow 5-7 business days for completion.   Pain Management After Surgery -You have been prescribed your pain medication and bowel regimen medications before surgery so that you can have these available when you are discharged from the hospital. The pain medication is for use ONLY AFTER surgery and a new prescription will not be given.    -Make sure that you have Tylenol and Ibuprofen (OR MOBIC, DO NOT TAKE TOGETHER) IF YOU ARE ABLE TO TAKE THESE MEDICATIONS at home to use on a regular basis after surgery for pain control. We recommend alternating the medications every hour to six hours since they work differently and are processed in the body differently for pain relief.   -Review the attached handout on narcotic use and their risks and side effects.    Bowel Regimen -You have been prescribed Sennakot-S to take nightly to prevent constipation especially if you are taking the narcotic pain medication intermittently.  It is important to prevent constipation and drink adequate amounts of liquids. You can stop taking this medication when you are not taking pain medication and you are back on your normal bowel routine.   Risks of Surgery Risks of surgery are low but include bleeding, infection, damage to surrounding structures, re-operation, blood clots, and very rarely death.     Blood Transfusion Information (For the consent to be signed before surgery)   We will be checking your blood type before surgery so in case of emergencies, we will know what type of blood you would need.                                             WHAT IS A BLOOD TRANSFUSION?   A transfusion is the replacement of blood or some of its parts. Blood is made up of multiple cells which provide different functions. Red blood cells carry oxygen and are used for blood loss replacement. White blood cells fight against infection. Platelets control bleeding. Plasma helps clot blood. Other blood products are available for  specialized needs, such as hemophilia or other clotting disorders. BEFORE THE TRANSFUSION  Who gives blood for transfusions?  You may be able to donate blood to be used at a later date on yourself (autologous donation). Relatives can be asked to donate blood. This is generally not any safer than if you have received blood from a stranger. The same precautions are taken to ensure safety when a relative's blood is donated. Healthy volunteers who are fully evaluated to make sure their blood is safe. This is blood bank blood. Transfusion therapy is the safest it has ever been in the practice of medicine. Before blood is taken from a donor, a complete history is taken to make sure that person has no history  of diseases nor engages in risky social behavior (examples are intravenous drug use or sexual activity with multiple partners). The donor's travel history is screened to minimize risk of transmitting infections, such as malaria. The donated blood is tested for signs of infectious diseases, such as HIV and hepatitis. The blood is then tested to be sure it is compatible with you in order to minimize the chance of a transfusion reaction. If you or a relative donates blood, this is often done in anticipation of surgery and is not appropriate for emergency situations. It takes many days to process the donated blood. RISKS AND COMPLICATIONS Although transfusion therapy is very safe and saves many lives, the main dangers of transfusion include:  Getting an infectious disease. Developing a transfusion reaction. This is an allergic reaction to something in the blood you were given. Every precaution is taken to prevent this. The decision to have a blood transfusion has been considered carefully by your caregiver before blood is given. Blood is not given unless the benefits outweigh the risks.   AFTER SURGERY INSTRUCTIONS   Return to work: 4-6 weeks if applicable   Activity: 1. Be up and out of the bed during  the day.  Take a nap if needed.  You may walk up steps but be careful and use the hand rail.  Stair climbing will tire you more than you think, you may need to stop part way and rest.    2. No lifting or straining for 6 weeks over 10 pounds. No pushing, pulling, straining for 6 weeks.   3. No driving for around 1 week(s).  Do not drive if you are taking narcotic pain medicine and make sure that your reaction time has returned.    4. You can shower as soon as the next day after surgery. Shower daily.  Use your regular soap and water (not directly on the incision) and pat your incision(s) dry afterwards; don't rub.  No tub baths or submerging your body in water until cleared by your surgeon. If you have the soap that was given to you by pre-surgical testing that was used before surgery, you do not need to use it afterwards because this can irritate your incisions.    5. No sexual activity and nothing in the vagina for 8 weeks.   6. You may experience a small amount of clear drainage from your incisions, which is normal.  If the drainage persists, increases, or changes color please call the office.   7. Do not use creams, lotions, or ointments such as neosporin on your incisions after surgery until advised by your surgeon because they can cause removal of the dermabond glue on your incisions.     8. You may experience vaginal spotting after surgery or around the 6-8 week mark from surgery when the stitches at the top of the vagina begin to dissolve.  The spotting is normal but if you experience heavy bleeding, call our office.   9. Take Tylenol or ibuprofen (OR MOBIC, DO NOT TAKE TOGETHER) first for pain if you are able to take these medications and only use narcotic pain medication for severe pain not relieved by the Tylenol or Ibuprofen.  Monitor your Tylenol intake to a max of 4,000 mg in a 24 hour period. You can alternate these medications after surgery.   Diet: 1. Low sodium Heart Healthy Diet  is recommended but you are cleared to resume your normal (before surgery) diet after your procedure.   2.  It is safe to use a laxative, such as Miralax or Colace, if you have difficulty moving your bowels. You have been prescribed Sennakot-S to take at bedtime every evening after surgery to keep bowel movements regular and to prevent constipation.     Wound Care: 1. Keep clean and dry.  Shower daily.   Reasons to call the Doctor: Fever - Oral temperature greater than 100.4 degrees Fahrenheit Foul-smelling vaginal discharge Difficulty urinating Nausea and vomiting Increased pain at the site of the incision that is unrelieved with pain medicine. Difficulty breathing with or without chest pain New calf pain especially if only on one side Sudden, continuing increased vaginal bleeding with or without clots.   Contacts: For questions or concerns you should contact:   Dr. Jeral Pinch at [phone]   Joylene John, NP at [phone]   After Hours: call [phone] and have the GYN Oncologist paged/contacted (after 5 pm or on the weekends).   Messages sent via mychart are for non-urgent matters and are not responded to after hours so for urgent needs, please call the after hours number.

## 2021-08-22 NOTE — Telephone Encounter (Signed)
Per Dr Berline Lopes fax records to the referring office

## 2021-08-22 NOTE — Progress Notes (Signed)
Patient here for new patient consultation with Dr. Jeral Pinch and for a pre-operative discussion prior to her scheduled surgery on September 10, 2021. She is scheduled for robotic assisted total laparoscopic hysterectomy, bilateral salpingo-oophorectomy, sentinel lymph node biopsy, possible lymph node dissection, possible laparotomy. The surgery was discussed in detail.  See after visit summary for additional details. Visual aids used to discuss items related to surgery including sequential compression stockings, foley catheter, IV pump, multi-modal pain regimen including tylenol, photo of the surgical robot, female reproductive system to discuss surgery in detail.      Discussed post-op pain management in detail including the aspects of the enhanced recovery pathway.  Advised her that a new prescription would be sent in for tramadol and it is only to be used for after her upcoming surgery.  We discussed the use of tylenol post-op and to monitor for a maximum of 4,000 mg in a 24 hour period.  Also prescribed sennakot to be used after surgery and to hold if having loose stools.  Discussed bowel regimen in detail.     Discussed the use of SCDs and measures to take at home to prevent DVT including frequent mobility.  Reportable signs and symptoms of DVT discussed. Post-operative instructions discussed and expectations for after surgery. Incisional care discussed as well including reportable signs and symptoms including erythema, drainage, wound separation.     10 minutes spent with the patient.  Verbalizing understanding of material discussed. No needs or concerns voiced at the end of the visit.   Advised patient and family to call for any needs.  Advised that her post-operative medications had been prescribed and could be picked up at any time. Upcoming appts discussed. Contrast given for CT scan.   This appointment is included in the global surgical bundle as pre-operative teaching and has no charge.

## 2021-08-27 ENCOUNTER — Telehealth: Payer: Self-pay | Admitting: *Deleted

## 2021-08-27 NOTE — Telephone Encounter (Signed)
Fax FMLA paperwork for patient's for both the patient's daughters

## 2021-08-29 NOTE — Patient Instructions (Signed)
DUE TO SPACE LIMITATIONS, ONLY TWO VISITORS  (aged 73 and older) ARE ALLOWED TO COME WITH YOU AND STAY IN THE WAITING ROOM DURING YOUR PRE OP AND PROCEDURE.   **NO VISITORS ARE ALLOWED IN THE SHORT STAY AREA OR RECOVERY ROOM!!**   You are not required to quarantine at this time prior to your surgery. However, you must do this: Hand Hygiene often Do NOT share personal items Notify your provider if you are in close contact with someone who has COVID or you develop fever 100.4 or greater, new onset of sneezing, cough, sore throat, shortness of breath or body aches.       Your procedure is scheduled on:  Wednesday  September 10, 2021  Report to [facility] Main Entrance.  Report to admitting at:  09:15   AM  +++++Call this number if you have any questions or problems the morning of surgery [phone]  Do not eat food :After Midnight the night prior to your surgery/procedure.  After Midnight you may have the following liquids until  08:30 AM DAY OF SURGERY  Clear Liquid Diet Water Black Coffee (sugar ok, NO MILK/CREAM OR CREAMERS)  Tea (sugar ok, NO MILK/CREAM OR CREAMERS) regular and decaf                             Plain Jell-O (NO RED)                                           Fruit ices (not with fruit pulp, NO RED)                                     Popsicles (NO RED)                                                                  Juice: apple, WHITE grape, WHITE cranberry Sports drinks like Gatorade (NO RED)                  FOLLOW BOWEL PREP AND ANY ADDITIONAL PRE OP INSTRUCTIONS YOU RECEIVED FROM YOUR SURGEON'S OFFICE!!!   Oral Hygiene is also important to reduce your risk of infection.        Remember - BRUSH YOUR TEETH THE MORNING OF SURGERY WITH YOUR REGULAR TOOTHPASTE   Take ONLY these medicines the morning of surgery with A SIP OF WATER: Atenolol, Amlodipine and Allopurinol.                 You may not have any metal on your body including hair pins,  jewelry, and body piercing  Do not wear make-up, lotions, powders, perfumes or deodorant  Do not wear nail polish including gel and S&S, artificial / acrylic nails, or any other type of covering on natural nails including finger and toenails. If you have artificial nails, gel coating, etc., that needs to be removed by a nail salon, Please have this removed prior to surgery. Not doing so may mean that your surgery could be cancelled or delayed if  the Surgeon or anesthesia staff feels like they are unable to monitor you safely.   Do not shave 48 hours prior to surgery to avoid nicks in your skin which may contribute to postoperative infections.   Contacts, Hearing Aids, dentures or bridgework may not be worn into surgery.   DO NOT [place]. PHARMACY WILL DISPENSE MEDICATIONS LISTED ON YOUR MEDICATION LIST TO YOU DURING YOUR ADMISSION [place]!   Patients discharged on the day of surgery will not be allowed to drive home.  Someone NEEDS to stay with you for the first 24 hours after anesthesia.  Special Instructions: Bring a copy of your healthcare power of attorney and living will documents the day of surgery, if you wish to have them scanned into your [facility] Records- EPIC  Please read over the following fact sheets you were given: IF YOU HAVE QUESTIONS ABOUT YOUR PRE-OP INSTRUCTIONS, PLEASE CALL [phone]  ([place])   [facility] - Preparing for Surgery Before surgery, you can play an important role.  Because skin is not sterile, your skin needs to be as free of germs as possible.  You can reduce the number of germs on your skin by washing with CHG (chlorahexidine gluconate) soap before surgery.  CHG is an antiseptic cleaner which kills germs and bonds with the skin to continue killing germs even after washing. Please DO NOT use if you have an allergy to CHG or antibacterial soaps.  If your skin becomes reddened/irritated stop using the CHG and  inform your nurse when you arrive at Short Stay. Do not shave (including legs and underarms) for at least 48 hours prior to the first CHG shower.  You may shave your face/neck.  Please follow these instructions carefully:  1.  Shower with CHG Soap the night before surgery and the  morning of surgery.  2.  If you choose to wash your hair, wash your hair first as usual with your normal  shampoo.  3.  After you shampoo, rinse your hair and body thoroughly to remove the shampoo.                             4.  Use CHG as you would any other liquid soap.  You can apply chg directly to the skin and wash.  Gently with a scrungie or clean washcloth.  5.  Apply the CHG Soap to your body ONLY FROM THE NECK DOWN.   Do not use on face/ open                           Wound or open sores. Avoid contact with eyes, ears mouth and genitals (private parts).                       Wash face,  Genitals (private parts) with your normal soap.             6.  Wash thoroughly, paying special attention to the area where your  surgery  will be performed.  7.  Thoroughly rinse your body with warm water from the neck down.  8.  DO NOT shower/wash with your normal soap after using and rinsing off the CHG Soap.            9.  Pat yourself dry with a clean towel.  10.  Wear clean pajamas.            11.  Place clean sheets on your bed the night of your first shower and do not  sleep with pets.  ON THE DAY OF SURGERY : Do not apply any lotions/deodorants the morning of surgery.  Please wear clean clothes to the hospital/surgery center.    FAILURE TO FOLLOW THESE INSTRUCTIONS MAY RESULT IN THE CANCELLATION OF YOUR SURGERY  PATIENT SIGNATURE_________________________________  NURSE SIGNATURE__________________________________  ________________________________________________________________________

## 2021-08-29 NOTE — Progress Notes (Addendum)
COVID Vaccine received:  '[]'$  No '[x]'$  Yes  Date of any COVID positive Test in last 90 days:  PCP - [name], DO  Chest x-ray -  EKG -  09-01-21  at PST appt Stress Test -  ECHO -  Cardiac Cath -   Pacemaker/ICD device last checked: Date:       '[]'$  N/A Spinal Cord Stimulator:'[]'$  No '[]'$  Yes   Other Implants:   Bowel Prep -   History of Sleep Apnea? '[]'$  No '[]'$  Yes  '[]'$  unknown Sleep Study Date:   CPAP used?- '[]'$  No '[]'$  Yes  (Instruct to bring their mask & Tubing)  Does the patient monitor blood sugar? '[]'$  No '[]'$  Yes  '[]'$  N/A Does patient have a [employer] or Dexacom? '[]'$  No '[]'$  Yes   Fasting Blood Sugar Ranges-  Checks Blood Sugar _____ times a day  Blood Thinner Instructions: Aspirin Instructions: Last Dose:  Activity level:  Can go up a flight of stairs and perform activities of daily living without stopping and       without symptoms of chest pain or shortness of breath.'[]'$  No '[]'$  Yes  '[]'$  N/A  Able to do exercises without symptoms '[]'$  No '[]'$  Yes  '[]'$  N/A  Patient able to complete ADLs without assistance '[]'$  No '[]'$  Yes  '[]'$  N/A    Comments:   Anesthesia review:   Patient denies shortness of breath, fever, cough and chest pain at PAT appointment  Patient verbalized understanding and agreement to the Pre-Surgical Instructions that were given to them at this PAT appointment. Patient was also educated of the need to review these PAT instructions again prior to his/her surgery.I reviewed the appropriate phone numbers to call if they have any and questions or concerns.

## 2021-09-01 ENCOUNTER — Ambulatory Visit (HOSPITAL_COMMUNITY)
Admission: RE | Admit: 2021-09-01 | Discharge: 2021-09-01 | Disposition: A | Discharge status: Home / Self Care | Payer: Medicare Other | Source: Ambulatory Visit | Attending: Gynecologic Oncology | Admitting: Gynecologic Oncology

## 2021-09-01 ENCOUNTER — Encounter (HOSPITAL_COMMUNITY)
Admission: RE | Admit: 2021-09-01 | Discharge: 2021-09-01 | Disposition: A | Discharge status: Home / Self Care | Payer: Medicare Other | Source: Ambulatory Visit | Attending: Gynecologic Oncology | Admitting: Gynecologic Oncology

## 2021-09-01 ENCOUNTER — Encounter (HOSPITAL_COMMUNITY): Payer: Self-pay | Admitting: *Deleted

## 2021-09-01 ENCOUNTER — Other Ambulatory Visit: Payer: Self-pay

## 2021-09-01 VITALS — BP 110/66 | HR 72 | Temp 98.6°F | Resp 16 | Ht 68.0 in

## 2021-09-01 DIAGNOSIS — Z01818 Encounter for other preprocedural examination: Secondary | ICD-10-CM

## 2021-09-01 DIAGNOSIS — C801 Malignant (primary) neoplasm, unspecified: Secondary | ICD-10-CM | POA: Diagnosis present

## 2021-09-01 DIAGNOSIS — R590 Localized enlarged lymph nodes: Secondary | ICD-10-CM | POA: Diagnosis not present

## 2021-09-01 DIAGNOSIS — K5730 Diverticulosis of large intestine without perforation or abscess without bleeding: Secondary | ICD-10-CM | POA: Diagnosis not present

## 2021-09-01 DIAGNOSIS — R7303 Prediabetes: Secondary | ICD-10-CM | POA: Diagnosis present

## 2021-09-01 DIAGNOSIS — K8020 Calculus of gallbladder without cholecystitis without obstruction: Secondary | ICD-10-CM | POA: Diagnosis not present

## 2021-09-01 HISTORY — DX: Unspecified osteoarthritis, unspecified site: M19.90

## 2021-09-01 HISTORY — DX: Prediabetes: R73.03

## 2021-09-01 LAB — COMPREHENSIVE METABOLIC PANEL
ALT: 10 U/L (ref 0–44)
AST: 19 U/L (ref 15–41)
Albumin: 3.2 g/dL — ABNORMAL LOW (ref 3.5–5.0)
Alkaline Phosphatase: 59 U/L (ref 38–126)
Anion gap: 9 (ref 5–15)
BUN: 10 mg/dL (ref 8–23)
CO2: 26 mmol/L (ref 22–32)
Calcium: 9.3 mg/dL (ref 8.9–10.3)
Chloride: 102 mmol/L (ref 98–111)
Creatinine, Ser: 1.01 mg/dL — ABNORMAL HIGH (ref 0.44–1.00)
GFR, Estimated: 59 mL/min — ABNORMAL LOW (ref >60)
Glucose, Bld: 104 mg/dL — ABNORMAL HIGH (ref 70–99)
Potassium: 4.3 mmol/L (ref 3.5–5.1)
Sodium: 137 mmol/L (ref 135–145)
Total Bilirubin: 0.7 mg/dL (ref 0.3–1.2)
Total Protein: 7.9 g/dL (ref 6.5–8.1)

## 2021-09-01 LAB — GLUCOSE, CAPILLARY: Glucose-Capillary: 84 mg/dL (ref 70–99)

## 2021-09-01 LAB — CBC
HCT: 31.9 % — ABNORMAL LOW (ref 36.0–46.0)
Hemoglobin: 9.7 g/dL — ABNORMAL LOW (ref 12.0–15.0)
MCH: 26.1 pg (ref 26.0–34.0)
MCHC: 30.4 g/dL (ref 30.0–36.0)
MCV: 86 fL (ref 80.0–100.0)
Platelets: 401 10*3/uL — ABNORMAL HIGH (ref 150–400)
RBC: 3.71 MIL/uL — ABNORMAL LOW (ref 3.87–5.11)
RDW: 14.5 % (ref 11.5–15.5)
WBC: 9.7 10*3/uL (ref 4.0–10.5)
nRBC: 0 % (ref 0.0–0.2)

## 2021-09-01 LAB — HEMOGLOBIN A1C
Hgb A1c MFr Bld: 6.1 % — ABNORMAL HIGH (ref 4.8–5.6)
Mean Plasma Glucose: 128.37 mg/dL

## 2021-09-01 MED ORDER — SODIUM CHLORIDE (PF) 0.9 % IJ SOLN
INTRAMUSCULAR | Status: AC
  Filled 2021-09-01: qty 50

## 2021-09-01 MED ORDER — IOHEXOL 300 MG/ML  SOLN
100.0000 mL | Freq: Once | INTRAMUSCULAR | Status: AC | PRN
  Administered 2021-09-01: 100 mL via INTRAVENOUS

## 2021-09-02 ENCOUNTER — Telehealth: Payer: Self-pay | Admitting: *Deleted

## 2021-09-02 NOTE — Telephone Encounter (Signed)
Patient's daughter called and wanted to know the time length of the surgery; per Dr Charisse March surgery sheet time will be 3 hours. Daughter advised that the time for surgery is 3 hours

## 2021-09-03 ENCOUNTER — Telehealth: Payer: Self-pay

## 2021-09-03 ENCOUNTER — Telehealth: Payer: Self-pay | Admitting: Gynecologic Oncology

## 2021-09-03 NOTE — Telephone Encounter (Signed)
Received call from patients daughter Heath Lark. She reports after discussion they would like to proceed with surgery as scheduled on 09/10/21. Advised that Dr. Berline Lopes will be notified.

## 2021-09-03 NOTE — Telephone Encounter (Signed)
Called patient and her daughter. Reviewed CT images which was concerning for metastatic disease. Discussed two possible options including moving forward with surgery as scheduled (plan for TRH/Bso, mini-lap, lymphadenectomy, and any other indicated procedures including possible bowel surgery) versus starting with NACT with consideration for surgery based on response. Discussed that surgery now will require at least a mini-lap, possible bowel surgery. The family asked appropriate questions, would like some time to discuss options. I've asked them to call the office to let me know if they have further questions and after they'd have time to discuss.  Jeral Pinch MD Gynecologic Oncology

## 2021-09-09 ENCOUNTER — Telehealth: Payer: Self-pay

## 2021-09-09 NOTE — Telephone Encounter (Signed)
Telephone call to check on pre-operative status.  Patient compliant with pre-operative instructions.  Reinforced nothing to eat after midnight. Clear liquids until 0830. Patient to arrive at 0915.  No questions or concerns voiced.  Instructed to call for any needs. 

## 2021-09-10 ENCOUNTER — Encounter (HOSPITAL_COMMUNITY): Payer: Self-pay | Admitting: Gynecologic Oncology

## 2021-09-10 ENCOUNTER — Other Ambulatory Visit: Payer: Self-pay

## 2021-09-10 ENCOUNTER — Ambulatory Visit (HOSPITAL_COMMUNITY): Payer: Medicare Other | Admitting: Physician Assistant

## 2021-09-10 ENCOUNTER — Ambulatory Visit (HOSPITAL_COMMUNITY)
Admission: RE | Admit: 2021-09-10 | Discharge: 2021-09-10 | Disposition: A | Discharge status: Home / Self Care | Payer: Medicare Other | Source: Ambulatory Visit | Attending: Gynecologic Oncology | Admitting: Gynecologic Oncology

## 2021-09-10 ENCOUNTER — Encounter (HOSPITAL_COMMUNITY)
Admission: RE | Disposition: A | Discharge status: Home / Self Care | Payer: Self-pay | Source: Ambulatory Visit | Attending: Gynecologic Oncology

## 2021-09-10 ENCOUNTER — Telehealth: Payer: Self-pay

## 2021-09-10 ENCOUNTER — Ambulatory Visit (HOSPITAL_BASED_OUTPATIENT_CLINIC_OR_DEPARTMENT_OTHER): Payer: Medicare Other | Admitting: Anesthesiology

## 2021-09-10 DIAGNOSIS — I10 Essential (primary) hypertension: Secondary | ICD-10-CM | POA: Insufficient documentation

## 2021-09-10 DIAGNOSIS — C541 Malignant neoplasm of endometrium: Secondary | ICD-10-CM | POA: Insufficient documentation

## 2021-09-10 DIAGNOSIS — R599 Enlarged lymph nodes, unspecified: Secondary | ICD-10-CM | POA: Diagnosis not present

## 2021-09-10 DIAGNOSIS — K660 Peritoneal adhesions (postprocedural) (postinfection): Secondary | ICD-10-CM

## 2021-09-10 DIAGNOSIS — C55 Malignant neoplasm of uterus, part unspecified: Secondary | ICD-10-CM | POA: Diagnosis not present

## 2021-09-10 DIAGNOSIS — C801 Malignant (primary) neoplasm, unspecified: Secondary | ICD-10-CM

## 2021-09-10 DIAGNOSIS — M1990 Unspecified osteoarthritis, unspecified site: Secondary | ICD-10-CM | POA: Diagnosis not present

## 2021-09-10 DIAGNOSIS — Z79899 Other long term (current) drug therapy: Secondary | ICD-10-CM | POA: Insufficient documentation

## 2021-09-10 DIAGNOSIS — R7303 Prediabetes: Secondary | ICD-10-CM

## 2021-09-10 HISTORY — PX: LAPAROSCOPY: SHX197

## 2021-09-10 LAB — TYPE AND SCREEN
ABO/RH(D): A POS
Antibody Screen: NEGATIVE

## 2021-09-10 LAB — ABO/RH: ABO/RH(D): A POS

## 2021-09-10 SURGERY — LAPAROSCOPY, DIAGNOSTIC
Anesthesia: General

## 2021-09-10 MED ORDER — LIDOCAINE HCL (PF) 2 % IJ SOLN
INTRAMUSCULAR | Status: AC
  Filled 2021-09-10: qty 25

## 2021-09-10 MED ORDER — ACETAMINOPHEN 10 MG/ML IV SOLN: 1000.0000 mg | Freq: Once | INTRAVENOUS | Status: DC | PRN

## 2021-09-10 MED ORDER — ROCURONIUM BROMIDE 10 MG/ML (PF) SYRINGE
PREFILLED_SYRINGE | INTRAVENOUS | Status: DC | PRN
  Administered 2021-09-10: 80 mg via INTRAVENOUS

## 2021-09-10 MED ORDER — ACETAMINOPHEN 500 MG PO TABS
1000.0000 mg | ORAL_TABLET | ORAL | Status: AC
  Administered 2021-09-10: 1000 mg via ORAL
  Filled 2021-09-10: qty 2

## 2021-09-10 MED ORDER — TRAMADOL HCL 50 MG PO TABS: 50.0000 mg | ORAL_TABLET | Freq: Once | ORAL | Status: DC | PRN

## 2021-09-10 MED ORDER — PROPOFOL 10 MG/ML IV BOLUS
INTRAVENOUS | Status: DC | PRN
  Administered 2021-09-10: 10 ug/kg/min via INTRAVENOUS
  Administered 2021-09-10: 20 mg via INTRAVENOUS
  Administered 2021-09-10: 120 mg via INTRAVENOUS

## 2021-09-10 MED ORDER — BUPIVACAINE HCL 0.25 % IJ SOLN
INTRAMUSCULAR | Status: AC
  Filled 2021-09-10: qty 1

## 2021-09-10 MED ORDER — SUGAMMADEX SODIUM 500 MG/5ML IV SOLN
INTRAVENOUS | Status: AC
  Filled 2021-09-10: qty 5

## 2021-09-10 MED ORDER — BUPIVACAINE HCL 0.25 % IJ SOLN
INTRAMUSCULAR | Status: DC | PRN
  Administered 2021-09-10: 15 mL

## 2021-09-10 MED ORDER — DEXAMETHASONE SODIUM PHOSPHATE 10 MG/ML IJ SOLN
INTRAMUSCULAR | Status: AC
  Filled 2021-09-10: qty 1

## 2021-09-10 MED ORDER — ROCURONIUM BROMIDE 10 MG/ML (PF) SYRINGE
PREFILLED_SYRINGE | INTRAVENOUS | Status: AC
  Filled 2021-09-10: qty 10

## 2021-09-10 MED ORDER — ONDANSETRON HCL 4 MG/2ML IJ SOLN
INTRAMUSCULAR | Status: AC
Start: 2021-09-10 — End: ?
  Filled 2021-09-10: qty 2

## 2021-09-10 MED ORDER — CEFAZOLIN SODIUM-DEXTROSE 2-4 GM/100ML-% IV SOLN
2.0000 g | INTRAVENOUS | Status: AC
  Administered 2021-09-10: 2 g via INTRAVENOUS
  Filled 2021-09-10: qty 100

## 2021-09-10 MED ORDER — FENTANYL CITRATE (PF) 250 MCG/5ML IJ SOLN
INTRAMUSCULAR | Status: AC
  Filled 2021-09-10: qty 5

## 2021-09-10 MED ORDER — HEPARIN SODIUM (PORCINE) 5000 UNIT/ML IJ SOLN
5000.0000 [IU] | INTRAMUSCULAR | Status: AC
  Administered 2021-09-10: 5000 [IU] via SUBCUTANEOUS
  Filled 2021-09-10: qty 1

## 2021-09-10 MED ORDER — LIDOCAINE 2% (20 MG/ML) 5 ML SYRINGE
INTRAMUSCULAR | Status: DC | PRN
  Administered 2021-09-10: 1.5 mg/kg/h via INTRAVENOUS
  Administered 2021-09-10: 60 mg via INTRAVENOUS

## 2021-09-10 MED ORDER — MIDAZOLAM HCL 2 MG/2ML IJ SOLN
INTRAMUSCULAR | Status: DC | PRN
  Administered 2021-09-10: 2 mg via INTRAVENOUS

## 2021-09-10 MED ORDER — FENTANYL CITRATE (PF) 250 MCG/5ML IJ SOLN
INTRAMUSCULAR | Status: DC | PRN
  Administered 2021-09-10: 100 ug via INTRAVENOUS

## 2021-09-10 MED ORDER — DEXAMETHASONE SODIUM PHOSPHATE 10 MG/ML IJ SOLN
INTRAMUSCULAR | Status: DC | PRN
  Administered 2021-09-10: 10 mg via INTRAVENOUS

## 2021-09-10 MED ORDER — ORAL CARE MOUTH RINSE: 15.0000 mL | Freq: Once | OROMUCOSAL | Status: AC

## 2021-09-10 MED ORDER — FENTANYL CITRATE PF 50 MCG/ML IJ SOSY: 25.0000 ug | PREFILLED_SYRINGE | INTRAMUSCULAR | Status: DC | PRN

## 2021-09-10 MED ORDER — DEXAMETHASONE SODIUM PHOSPHATE 4 MG/ML IJ SOLN: 4.0000 mg | INTRAMUSCULAR | Status: DC

## 2021-09-10 MED ORDER — LACTATED RINGERS IV SOLN: INTRAVENOUS | Status: DC

## 2021-09-10 MED ORDER — ACETAMINOPHEN 160 MG/5ML PO SOLN: 1000.0000 mg | Freq: Once | ORAL | Status: DC | PRN

## 2021-09-10 MED ORDER — PROPOFOL 10 MG/ML IV BOLUS
INTRAVENOUS | Status: AC
  Filled 2021-09-10: qty 20

## 2021-09-10 MED ORDER — MIDAZOLAM HCL 2 MG/2ML IJ SOLN
INTRAMUSCULAR | Status: AC
  Filled 2021-09-10: qty 2

## 2021-09-10 MED ORDER — SUGAMMADEX SODIUM 500 MG/5ML IV SOLN
INTRAVENOUS | Status: DC | PRN
  Administered 2021-09-10: 400 mg via INTRAVENOUS

## 2021-09-10 MED ORDER — STERILE WATER FOR IRRIGATION IR SOLN
Status: DC | PRN
  Administered 2021-09-10: 1000 mL

## 2021-09-10 MED ORDER — LACTATED RINGERS IR SOLN
Status: DC | PRN
  Administered 2021-09-10: 1000 mL

## 2021-09-10 MED ORDER — ACETAMINOPHEN 500 MG PO TABS: 1000.0000 mg | ORAL_TABLET | Freq: Once | ORAL | Status: DC | PRN

## 2021-09-10 MED ORDER — ONDANSETRON HCL 4 MG/2ML IJ SOLN
INTRAMUSCULAR | Status: DC | PRN
  Administered 2021-09-10: 4 mg via INTRAVENOUS

## 2021-09-10 MED ORDER — CHLORHEXIDINE GLUCONATE 0.12 % MT SOLN
15.0000 mL | Freq: Once | OROMUCOSAL | Status: AC
  Administered 2021-09-10: 15 mL via OROMUCOSAL

## 2021-09-10 SURGICAL SUPPLY — 71 items
APPLICATOR SURGIFLO ENDO (HEMOSTASIS) IMPLANT
BAG LAPAROSCOPIC 12 15 PORT 16 (BASKET) IMPLANT
BAG RETRIEVAL 12/15 (BASKET)
BLADE SURG SZ10 CARB STEEL (BLADE) IMPLANT
COVER BACK TABLE 60X90IN (DRAPES) ×3 IMPLANT
COVER TIP SHEARS 8 DVNC (MISCELLANEOUS) ×2 IMPLANT
COVER TIP SHEARS 8MM DA VINCI (MISCELLANEOUS) ×1
DERMABOND ADVANCED (GAUZE/BANDAGES/DRESSINGS) ×1
DERMABOND ADVANCED .7 DNX12 (GAUZE/BANDAGES/DRESSINGS) ×2 IMPLANT
DRAPE ARM DVNC X/XI (DISPOSABLE) ×8 IMPLANT
DRAPE COLUMN DVNC XI (DISPOSABLE) ×2 IMPLANT
DRAPE DA VINCI XI ARM (DISPOSABLE) ×4
DRAPE DA VINCI XI COLUMN (DISPOSABLE) ×1
DRAPE SHEET LG 3/4 BI-LAMINATE (DRAPES) ×3 IMPLANT
DRAPE SURG IRRIG POUCH 19X23 (DRAPES) ×3 IMPLANT
DRSG OPSITE POSTOP 4X6 (GAUZE/BANDAGES/DRESSINGS) IMPLANT
DRSG OPSITE POSTOP 4X8 (GAUZE/BANDAGES/DRESSINGS) IMPLANT
ELECT PENCIL ROCKER SW 15FT (MISCELLANEOUS) IMPLANT
ELECT REM PT RETURN 15FT ADLT (MISCELLANEOUS) ×3 IMPLANT
GAUZE 4X4 16PLY ~~LOC~~+RFID DBL (SPONGE) ×6 IMPLANT
GLOVE BIO SURGEON STRL SZ 6 (GLOVE) ×12 IMPLANT
GLOVE BIO SURGEON STRL SZ 6.5 (GLOVE) IMPLANT
GOWN STRL REUS W/ TWL LRG LVL3 (GOWN DISPOSABLE) ×8 IMPLANT
GOWN STRL REUS W/TWL LRG LVL3 (GOWN DISPOSABLE) ×4
HOLDER FOLEY CATH W/STRAP (MISCELLANEOUS) IMPLANT
IRRIG SUCT STRYKERFLOW 2 WTIP (MISCELLANEOUS) ×3
IRRIGATION SUCT STRKRFLW 2 WTP (MISCELLANEOUS) ×2 IMPLANT
KIT PROCEDURE DA VINCI SI (MISCELLANEOUS)
KIT PROCEDURE DVNC SI (MISCELLANEOUS) IMPLANT
KIT TURNOVER KIT A (KITS) IMPLANT
LIGASURE IMPACT 36 18CM CVD LR (INSTRUMENTS) IMPLANT
MANIPULATOR ADVINCU DEL 3.0 PL (MISCELLANEOUS) IMPLANT
MANIPULATOR ADVINCU DEL 3.5 PL (MISCELLANEOUS) IMPLANT
MANIPULATOR UTERINE 4.5 ZUMI (MISCELLANEOUS) IMPLANT
NDL HYPO 21X1.5 SAFETY (NEEDLE) ×1 IMPLANT
NDL SPNL 18GX3.5 QUINCKE PK (NEEDLE) IMPLANT
NEEDLE HYPO 21X1.5 SAFETY (NEEDLE) ×3 IMPLANT
NEEDLE SPNL 18GX3.5 QUINCKE PK (NEEDLE) IMPLANT
OBTURATOR OPTICAL STANDARD 8MM (TROCAR) ×1
OBTURATOR OPTICAL STND 8 DVNC (TROCAR) ×2
OBTURATOR OPTICALSTD 8 DVNC (TROCAR) ×2 IMPLANT
PACK ROBOT GYN CUSTOM WL (TRAY / TRAY PROCEDURE) ×3 IMPLANT
PAD POSITIONING PINK XL (MISCELLANEOUS) ×3 IMPLANT
PORT ACCESS TROCAR AIRSEAL 12 (TROCAR) ×2 IMPLANT
PORT ACCESS TROCAR AIRSEAL 5M (TROCAR) ×1
SCRUB CHG 4% DYNA-HEX 4OZ (MISCELLANEOUS) ×6 IMPLANT
SEAL CANN UNIV 5-8 DVNC XI (MISCELLANEOUS) ×8 IMPLANT
SEAL XI 5MM-8MM UNIVERSAL (MISCELLANEOUS) ×4
SET TRI-LUMEN FLTR TB AIRSEAL (TUBING) ×3 IMPLANT
SPIKE FLUID TRANSFER (MISCELLANEOUS) ×3 IMPLANT
SPONGE T-LAP 18X18 ~~LOC~~+RFID (SPONGE) IMPLANT
SURGIFLO W/THROMBIN 8M KIT (HEMOSTASIS) IMPLANT
SUT MNCRL AB 4-0 PS2 18 (SUTURE) IMPLANT
SUT PDS AB 1 TP1 96 (SUTURE) IMPLANT
SUT VIC AB 0 CT1 27 (SUTURE)
SUT VIC AB 0 CT1 27XBRD ANTBC (SUTURE) IMPLANT
SUT VIC AB 2-0 CT1 27 (SUTURE)
SUT VIC AB 2-0 CT1 TAPERPNT 27 (SUTURE) IMPLANT
SUT VIC AB 4-0 PS2 18 (SUTURE) ×6 IMPLANT
SYR 10ML LL (SYRINGE) IMPLANT
SYS BAG RETRIEVAL 10MM (BASKET)
SYS WOUND ALEXIS 18CM MED (MISCELLANEOUS)
SYSTEM BAG RETRIEVAL 10MM (BASKET) IMPLANT
SYSTEM WOUND ALEXIS 18CM MED (MISCELLANEOUS) IMPLANT
TOWEL OR NON WOVEN STRL DISP B (DISPOSABLE) IMPLANT
TRAP SPECIMEN MUCUS 40CC (MISCELLANEOUS) IMPLANT
TRAY FOLEY MTR SLVR 16FR STAT (SET/KITS/TRAYS/PACK) ×3 IMPLANT
TROCAR Z-THREAD FIOS 5X100MM (TROCAR) IMPLANT
UNDERPAD 30X36 HEAVY ABSORB (UNDERPADS AND DIAPERS) ×6 IMPLANT
WATER STERILE IRR 1000ML POUR (IV SOLUTION) ×3 IMPLANT
YANKAUER SUCT BULB TIP 10FT TU (MISCELLANEOUS) IMPLANT

## 2021-09-10 NOTE — Op Note (Signed)
OPERATIVE NOTE  Pre-operative Diagnosis: carcinosarcoma of the uterus, adenopathy on imaging  Post-operative Diagnosis: same, stage IVB carcinosarcoma of the uterus  Operation: Diagnostic laparoscopy   Surgeon: Jeral Pinch MD  Assistant Surgeon: Lahoma Crocker MD (an MD assistant was necessary for tissue manipulation, management of robotic instrumentation, retraction and positioning due to the complexity of the case and hospital policies).   Anesthesia: GET  Urine Output: 100cc  Operative Findings: On EUA, cervix 2 cm dilated with tumor noted within the os. Cervix otherwise normal appearing. Uterus enlarged, 12 cm, with limited mobility secondary to weight. Compression of the rectum from uterus although no direct invasion appreciated. On intra-abdominal inspection, adhesions noted between the liver and the anterior abdominal wall. Evidence of carcinomatosis involving liver surface, diaphragm, omentum, anterior abdominal wall. Surface of the uterus with obvious tumor infiltration. Small bowel free from uterus. Posteriorly, sigmoid densely adherent to the posterior uterus. Even with manipulator, unable to move the uterus anteriorly. Minimal ascites.   Given findings of stage IV disease and what would require large bowel resection and end ostomy, decision made to abort surgery in favor of neoadjuvant chemotherapy. I had discussed this as a distinct possibility when we had gotten her CT results as well as this morning prior to the surgery.   Estimated Blood Loss:  75 cc (vaginally)     Total IV Fluids: see I&O flowsheet         Specimens: none         Complications:  None apparent; patient tolerated the procedure well.         Disposition: PACU - hemodynamically stable.  Procedure Details  The patient was seen in the Holding Room. The risks, benefits, complications, treatment options, and expected outcomes were discussed with the patient.  The patient concurred with the proposed  plan, giving informed consent.  The site of surgery properly noted/marked. The patient was identified as Sarah Wu and the procedure verified as a Robotic-assisted hysterectomy with bilateral salpingo oophorectomy, lymph node dissection, other indicated procedures.   After induction of anesthesia, the patient was draped and prepped in the usual sterile manner. Patient was placed in supine position after anesthesia and draped and prepped in the usual sterile manner as follows: Her arms were tucked to her side with all appropriate precautions.  The shoulders were stabilized with padded shoulder blocks applied to the acromium processes.  The patient was placed in the semi-lithotomy position in [place].  The perineum and vagina were prepped with CholoraPrep. The patient was draped after the CholoraPrep had been allowed to dry for 3 minutes.  A Time Out was held and the above information confirmed.  The urethra was prepped with Betadine. Foley catheter was placed.  A sterile speculum was placed in the vagina.  The cervix was grasped with a single-tooth tenaculum. The cervix was dilated with Kennon Rounds dilators.  The ZUMI uterine manipulator with a medium colpotomizer ring was placed without difficulty.  A pneum occluder balloon was placed over the manipulator.  OG tube placement was confirmed and to suction.   Next, a 10 mm skin incision was made 1 cm below the subcostal margin in the midclavicular line.  The 5 mm Optiview port and scope was used for direct entry.  Opening pressure was under 10 mm CO2.  The abdomen was insufflated and the findings were noted as above.   Patient was placed in Trendelenburg for pelvic assessment.  The subcuticular tissue was closed with 4-0 Vicryl and the  skin was closed with 4-0 Monocryl in a subcuticular manner.  Dermabond was applied.    The vagina was packed after the manipulator was removed. Foley catheter was removed. Packing was removed approximately 3 minute  later with no active bleeding noted. All sponge, lap and needle counts were correct x  3.   The patient was transferred to the recovery room in stable condition.  I spoke with the patient's daughters after the procedure. I offered referred to medical oncology here versus in [place]. Their preference is for treatment closer to patient's home.   Jeral Pinch, MD

## 2021-09-10 NOTE — Interval H&P Note (Signed)
History and Physical Interval Note:  09/10/2021 11:06 AM  Sarah Wu  has presented today for surgery, with the diagnosis of CARCINOSARCOMA OF THE UTERUS.  The various methods of treatment have been discussed with the patient and family. After consideration of risks, benefits and other options for treatment, the patient has consented to  Procedure(s): XI ROBOTIC ASSISTED TOTAL HYSTERECTOMY WITH BILATERAL SALPINGO OOPHORECTOMY, POSSIBLE LAPAROTOMY (Bilateral) SENTINEL NODE BIOPSY (N/A) POSSIBLE LYMPH NODE DISSECTION (N/A) as a surgical intervention.  The patient's history has been reviewed, patient examined, no change in status, stable for surgery.  I have reviewed the patient's chart and labs.  Questions were answered to the patient's satisfaction.     Lafonda Mosses

## 2021-09-10 NOTE — Anesthesia Postprocedure Evaluation (Signed)
Anesthesia Post Note  Patient: Sarah Wu  Procedure(s) Performed: DIAGNOSTIC LAPAROSCOPY     Patient location during evaluation: PACU Anesthesia Type: General Level of consciousness: awake and alert Pain management: pain level controlled Vital Signs Assessment: post-procedure vital signs reviewed and stable Respiratory status: spontaneous breathing, nonlabored ventilation, respiratory function stable and patient connected to nasal cannula oxygen Cardiovascular status: blood pressure returned to baseline and stable Postop Assessment: no apparent nausea or vomiting Anesthetic complications: no   No notable events documented.  Last Vitals:  Vitals:   09/10/21 1355 09/10/21 1446  BP: (!) 147/79 131/73  Pulse: 69 60  Resp: 15 15  Temp: 36.6 C 36.7 C  SpO2: 96% 97%    Last Pain:  Vitals:   09/10/21 1446  TempSrc:   PainSc: 0-No pain                 Javiel Canepa

## 2021-09-10 NOTE — Transfer of Care (Signed)
Immediate Anesthesia Transfer of Care Note  Patient: Sarah Wu  Procedure(s) Performed: DIAGNOSTIC LAPAROSCOPY  Patient Location: PACU  Anesthesia Type:General  Level of Consciousness: awake  Airway & Oxygen Therapy: Patient Spontanous Breathing and Patient connected to face mask oxygen  Post-op Assessment: Report given to RN and Post -op Vital signs reviewed and stable  Post vital signs: Reviewed and stable  Last Vitals:  Vitals Value Taken Time  BP 141/81 09/10/21 1315  Temp    Pulse 78 09/10/21 1315  Resp 17 09/10/21 1315  SpO2 100 % 09/10/21 1315  Vitals shown include unvalidated device data.  Last Pain:  Vitals:   09/10/21 0914  TempSrc:   PainSc: 0-No pain         Complications: No notable events documented.

## 2021-09-10 NOTE — Anesthesia Preprocedure Evaluation (Signed)
Anesthesia Evaluation  Patient identified by MRN, date of birth, ID band Patient awake    Reviewed: Allergy & Precautions, NPO status , Patient's Chart, lab work & pertinent test results  History of Anesthesia Complications Negative for: history of anesthetic complications  Airway Mallampati: II  TM Distance: >3 FB Neck ROM: Full    Dental  (+) Edentulous Upper, Missing,    Pulmonary neg pulmonary ROS, neg shortness of breath, neg COPD, neg recent URI,    breath sounds clear to auscultation       Cardiovascular hypertension, Pt. on medications (-) angina(-) Past MI and (-) CHF  Rhythm:Regular     Neuro/Psych negative neurological ROS     GI/Hepatic negative GI ROS, Neg liver ROS,   Endo/Other  Lab Results      Component                Value               Date                      HGBA1C                   6.1 (H)             09/01/2021             Renal/GU negative Renal ROSLab Results      Component                Value               Date                      CREATININE               1.01 (H)            09/01/2021                Musculoskeletal  (+) Arthritis ,   Abdominal   Peds  Hematology  (+) Blood dyscrasia, anemia , Lab Results      Component                Value               Date                      WBC                      9.7                 09/01/2021                HGB                      9.7 (L)             09/01/2021                HCT                      31.9 (L)            09/01/2021                MCV  86.0                09/01/2021                PLT                      401 (H)             09/01/2021              Anesthesia Other Findings   Reproductive/Obstetrics                             Anesthesia Physical Anesthesia Plan  ASA: 2  Anesthesia Plan: General   Post-op Pain Management: Tylenol PO (pre-op)*   Induction:  Intravenous  PONV Risk Score and Plan: 3 and Ondansetron, Dexamethasone and Propofol infusion  Airway Management Planned: Oral ETT  Additional Equipment: None  Intra-op Plan:   Post-operative Plan: Extubation in OR  Informed Consent: I have reviewed the patients History and Physical, chart, labs and discussed the procedure including the risks, benefits and alternatives for the proposed anesthesia with the patient or authorized representative who has indicated his/her understanding and acceptance.     Dental advisory given  Plan Discussed with: CRNA  Anesthesia Plan Comments:         Anesthesia Quick Evaluation

## 2021-09-10 NOTE — Anesthesia Procedure Notes (Signed)
Procedure Name: Intubation Date/Time: 09/10/2021 12:04 PM  Performed by: Sharlette Dense, CRNAPre-anesthesia Checklist: Patient identified, Emergency Drugs available, Suction available and Patient being monitored Patient Re-evaluated:Patient Re-evaluated prior to induction Oxygen Delivery Method: Circle system utilized Preoxygenation: Pre-oxygenation with 100% oxygen Induction Type: IV induction Ventilation: Mask ventilation without difficulty and Oral airway inserted - appropriate to patient size Laryngoscope Size: Sabra Heck and 2 Grade View: Grade I Tube type: Oral Tube size: 7.5 mm Number of attempts: 1 Airway Equipment and Method: Stylet Placement Confirmation: ETT inserted through vocal cords under direct vision, positive ETCO2 and breath sounds checked- equal and bilateral Secured at: 22 cm Tube secured with: Tape Dental Injury: Teeth and Oropharynx as per pre-operative assessment

## 2021-09-10 NOTE — Telephone Encounter (Signed)
Per Dr.Tucker, pt needs referred to [facility] in [place] for a port placement.   Records faxed to medical records, (Fax number: [phone]). I spoke to [place] who states Tameka, referral contact, is out of the office today, but will call us tomorrow once pt is scheduled. Courtney aware this is an urgent request.

## 2021-09-11 ENCOUNTER — Telehealth: Payer: Self-pay

## 2021-09-11 ENCOUNTER — Encounter (HOSPITAL_COMMUNITY): Payer: Self-pay | Admitting: Gynecologic Oncology

## 2021-09-11 NOTE — Telephone Encounter (Signed)
[facility] called. Pt is scheduled for initial appointment tomorrow 09/12/21 @ 1:30.  Dr.Tucker notified

## 2021-09-11 NOTE — Telephone Encounter (Signed)
Spoke with Ms. Gabay and her daughter this afternoon. She states she is eating, drinking and urinating well. She has not had a BM yet and is not passing gas. She is taking senokot as prescribed and encouraged her to drink plenty of water. She denies fever or chills. Incisions are dry and intact. She rates her pain 0/10. She has not needed to take any tylenol or mobic.   Patients daughter reports she has an appointment with [facility] tomorrow 09/12/21 at 1pm.  Instructed to call office with any fever, chills, purulent drainage, uncontrolled pain or any other questions or concerns. Patient verbalizes understanding.   Pt aware of post op appointments as well as the office number [phone] and after hours number [phone] to call if she has any questions or concerns

## 2021-09-11 NOTE — Telephone Encounter (Signed)
Pt is scheduled tomorrow 8/4 @ 1:30 with Med Oncology for continuation of care. She will be seeing Dr. Antionette Poles Dr.Tucker aware

## 2021-09-12 ENCOUNTER — Other Ambulatory Visit: Payer: Self-pay | Admitting: Gynecologic Oncology

## 2021-09-12 ENCOUNTER — Ambulatory Visit: Payer: Self-pay | Admitting: Gynecologic Oncology

## 2021-09-12 ENCOUNTER — Telehealth: Payer: Self-pay | Admitting: *Deleted

## 2021-09-12 DIAGNOSIS — C801 Malignant (primary) neoplasm, unspecified: Secondary | ICD-10-CM

## 2021-09-12 NOTE — Telephone Encounter (Signed)
Patient's daughter Sarah Wu called and stated "We just finished the appt with Dr Antionette Poles. He wanted to a couple of things. The first is does Dr Berline Lopes want the port placed there in [place] since you all have all her information? The second is after the third round of treatment would Dr Berline Lopes want the CT scan done there in [place] so that she can see the images? The last was was there any specific drugs that she wanted her to have?"   Explained that Dr Berline Lopes is clinic today and the message would be given to her the office would call her back later today.

## 2021-09-12 NOTE — Progress Notes (Signed)
I returned the patient's daughter call after they met with medical oncology in [place].  They found out that the patient's insurance will no longer be excepted there at the end of the month.  They would like to plan to have the port placed here and after talking about things, would like to undergo primary treatment here.  Port insertion order placed.  We will reach out to Dr. Alvy Bimler about seeing the patient hopefully next week.  Jeral Pinch MD Gynecologic Oncology

## 2021-09-15 ENCOUNTER — Telehealth: Payer: Self-pay | Admitting: *Deleted

## 2021-09-15 ENCOUNTER — Encounter: Payer: Self-pay | Admitting: Hematology and Oncology

## 2021-09-15 NOTE — Telephone Encounter (Signed)
Spoke with the patient regarding the referral to Med oncology. Patient scheduled as new patient with Dr Alvy Bimler on 8/9 at 10:30 am. Patient given an arrival time of 10 am.  Patient given the policy that no visitors under the 16 yrs are a loud in the [place]. Patient given the address/phone number for the clinic and that the center offers free valet service.

## 2021-09-17 ENCOUNTER — Ambulatory Visit: Payer: Medicare Other | Admitting: Gynecologic Oncology

## 2021-09-17 ENCOUNTER — Encounter: Payer: Self-pay | Admitting: Hematology and Oncology

## 2021-09-17 ENCOUNTER — Other Ambulatory Visit: Payer: Self-pay

## 2021-09-17 ENCOUNTER — Other Ambulatory Visit (HOSPITAL_COMMUNITY): Payer: Self-pay

## 2021-09-17 ENCOUNTER — Inpatient Hospital Stay: Payer: Medicare Other | Admitting: Gynecologic Oncology

## 2021-09-17 ENCOUNTER — Inpatient Hospital Stay: Payer: Medicare Other | Attending: Gynecologic Oncology | Admitting: Hematology and Oncology

## 2021-09-17 ENCOUNTER — Other Ambulatory Visit: Payer: Self-pay | Admitting: Radiology

## 2021-09-17 VITALS — BP 131/76 | HR 74 | Temp 97.7°F | Resp 16 | Ht 68.0 in | Wt 225.4 lb

## 2021-09-17 DIAGNOSIS — K5909 Other constipation: Secondary | ICD-10-CM | POA: Diagnosis not present

## 2021-09-17 DIAGNOSIS — C549 Malignant neoplasm of corpus uteri, unspecified: Secondary | ICD-10-CM

## 2021-09-17 DIAGNOSIS — D638 Anemia in other chronic diseases classified elsewhere: Secondary | ICD-10-CM

## 2021-09-17 DIAGNOSIS — Z79899 Other long term (current) drug therapy: Secondary | ICD-10-CM | POA: Diagnosis not present

## 2021-09-17 DIAGNOSIS — D61818 Other pancytopenia: Secondary | ICD-10-CM | POA: Insufficient documentation

## 2021-09-17 DIAGNOSIS — Z5111 Encounter for antineoplastic chemotherapy: Secondary | ICD-10-CM | POA: Diagnosis not present

## 2021-09-17 DIAGNOSIS — C55 Malignant neoplasm of uterus, part unspecified: Secondary | ICD-10-CM | POA: Diagnosis present

## 2021-09-17 DIAGNOSIS — Z6833 Body mass index (BMI) 33.0-33.9, adult: Secondary | ICD-10-CM | POA: Diagnosis not present

## 2021-09-17 DIAGNOSIS — R600 Localized edema: Secondary | ICD-10-CM | POA: Diagnosis not present

## 2021-09-17 DIAGNOSIS — I10 Essential (primary) hypertension: Secondary | ICD-10-CM | POA: Diagnosis not present

## 2021-09-17 DIAGNOSIS — C801 Malignant (primary) neoplasm, unspecified: Secondary | ICD-10-CM

## 2021-09-17 MED ORDER — DEXAMETHASONE 4 MG PO TABS
ORAL_TABLET | ORAL | 6 refills | Status: DC
  Filled 2021-09-17: qty 36, 18d supply, fill #0

## 2021-09-17 MED ORDER — LIDOCAINE-PRILOCAINE 2.5-2.5 % EX CREA
TOPICAL_CREAM | CUTANEOUS | 3 refills | Status: DC
  Filled 2021-09-17: qty 30, 1d supply, fill #0
  Filled 2021-10-20 – 2021-11-17 (×2): qty 30, 1d supply, fill #1

## 2021-09-17 MED ORDER — ONDANSETRON HCL 8 MG PO TABS
8.0000 mg | ORAL_TABLET | Freq: Three times a day (TID) | ORAL | 1 refills | Status: DC | PRN
  Filled 2021-09-17: qty 30, 10d supply, fill #0

## 2021-09-17 MED ORDER — PROCHLORPERAZINE MALEATE 10 MG PO TABS
10.0000 mg | ORAL_TABLET | Freq: Four times a day (QID) | ORAL | 1 refills | Status: DC | PRN
  Filled 2021-09-17: qty 30, 8d supply, fill #0

## 2021-09-17 NOTE — Progress Notes (Signed)
[name] NOTE  Patient Care Team: Lafonda Mosses, MD as PCP - General (Gynecologic Oncology)  ASSESSMENT & PLAN:  Uterine cancer [name]) I have reviewed imaging studies with the patient and her daughters We discussed the role of chemotherapy  We reviewed the NCCN guidelines I recommend treatment based on publication as follows:  Dostarlimab for Primary Advanced or Recurrent Endometrial Cancer  [place] Reola Mosher, M.D., Edwinna Areola. Cheri Rous, M.D., Virl Cagey. Slomovitz, M.D., Ren Loraine Maple, Ph.D., Suzie Portela, Ph.D., Para Skeans, M.D., Moises Blood, M.D., Annye Rusk, M.D., Verneda Skill, M.D., Vilinda Blanks. Carmin Muskrat, M.D., Ph.D., Aloha Gell. Romero Belling, M.D., Evlyn Clines, M.D., et al., for the [place] [phone] DOI: 10.1056/[id number]  BACKGROUND Dostarlimab is an immune-checkpoint inhibitor that targets the programmed cell death 1 receptor. The combination of chemotherapy and immunotherapy may have synergistic effects in the treatment of endometrial cancer.  METHODS We conducted a phase 3, global, double-blind, randomized, placebo-controlled trial. Eligible patients with primary advanced stage III or IV or first recurrent endometrial cancer were randomly assigned in a 1:1 ratio to receive either dostarlimab (500 mg) or placebo, plus carboplatin (area under the concentration-time curve, 5 mg per milliliter per minute) and paclitaxel (175 mg per square meter of body-surface area), every 3 weeks (six cycles), followed by dostarlimab (1000 mg) or placebo every 6 weeks for up to 3 years. The primary end points were progression-free survival as assessed by the investigator according to Response Evaluation Criteria in Solid Tumors (RECIST), version 1.1, and overall survival. Safety was also assessed.  RESULTS Of the 494 patients who underwent randomization, 118 (23.9%) had mismatch repair-deficient (dMMR), microsatellite  instability-high (MSI-H) tumors. In the dMMR-MSI-H population, estimated progression-free survival at 24 months was 61.4% (95% confidence interval [CI], 46.3 to 73.4) in the dostarlimab group and 15.7% (95% CI, 7.2 to 27.0) in the placebo group (hazard ratio for progression or death, 0.28; 95% CI, 0.16 to 0.50; P<0.001). In the overall population, progression-free survival at 24 months was 36.1% (95% CI, 29.3 to 42.9) in the dostarlimab group and 18.1% (95% CI, 13.0 to 23.9) in the placebo group (hazard ratio, 0.64; 95% CI, 0.51 to 0.80; P<0.001). Overall survival at 24 months was 71.3% (95% CI, 64.5 to 77.1) with dostarlimab and 56.0% (95% CI, 48.9 to 62.5) with placebo (hazard ratio for death, 0.64; 95% CI, 0.46 to 0.87). The most common adverse events that occurred or worsened during treatment were nausea (53.9% of the patients in the dostarlimab group and 45.9% of those in the placebo group), alopecia (53.5% and 50.0%), and fatigue (51.9% and 54.5%). Severe and serious adverse events were more frequent in the dostarlimab group than in the placebo group.  CONCLUSIONS Dostarlimab plus carboplatin-paclitaxel significantly increased progression-free survival among patients with primary advanced or recurrent endometrial cancer, with a substantial benefit in the dMMR-MSI-H population. (Funded by [employer]; [employer].gov number, QIH47425956)  The risks, benefits, side effects of treatment is discussed with the patient and she agreed to proceed with plan of care.  She is scheduled for port placement next week I recommend blood work, chemo education class and treatment to start next week I will see her back the following week for toxicity review I recommend minimum 3 cycles of treatment before we repeat imaging study  Essential hypertension She has poor oral intake I am concerned about risk of dehydration I recommend discontinuation of hydrochlorothiazide and to increase oral fluid as tolerated  BMI  33.0-33.9,adult Patient is  sedentary She is somewhat obese I believe fluid retention in her legs are contributing to her reduced mobility and sedentary lifestyle  Bilateral edema of lower extremity I recommend elastic compression hose, frequent ambulation and leg elevation as much as possible  Anemia, chronic disease This is likely anemia of chronic disease. The patient denies recent history of bleeding such as epistaxis, hematuria or hematochezia. She is asymptomatic from the anemia. We will observe for now.  She does not require transfusion now. I do not recommend any further work-up at this time.    Orders Placed This Encounter  Procedures   CBC with Differential ([place] Only)    Standing Status:   Future    Standing Expiration Date:   09/25/2022   CMP ([place] only)    Standing Status:   Future    Standing Expiration Date:   09/25/2022   T4    Standing Status:   Future    Standing Expiration Date:   09/25/2022   TSH    Standing Status:   Future    Standing Expiration Date:   09/25/2022   CBC with Differential ([facility] Only)    Standing Status:   Future    Standing Expiration Date:   10/17/2022   CMP ([place] only)    Standing Status:   Future    Standing Expiration Date:   10/17/2022   CBC with Differential ([place] Only)    Standing Status:   Future    Standing Expiration Date:   11/07/2022   CMP ([place] only)    Standing Status:   Future    Standing Expiration Date:   11/07/2022   T4    Standing Status:   Future    Standing Expiration Date:   11/07/2022   TSH    Standing Status:   Future    Standing Expiration Date:   11/07/2022   CBC with Differential ([facility] Center Only)    Standing Status:   Future    Standing Expiration Date:   11/28/2022   CMP ([place] only)    Standing Status:   Future    Standing Expiration Date:   11/28/2022   CBC with Differential ([place] Only)    Standing Status:   Future    Standing Expiration  Date:   12/19/2022   CMP ([place] only)    Standing Status:   Future    Standing Expiration Date:   12/19/2022   CBC with Differential ([place] Only)    Standing Status:   Future    Standing Expiration Date:   01/09/2023   CMP ([place] only)    Standing Status:   Future    Standing Expiration Date:   01/09/2023   T4    Standing Status:   Future    Standing Expiration Date:   01/09/2023   TSH    Standing Status:   Future    Standing Expiration Date:   01/09/2023   CBC with Differential (Cancer Center Only)    Standing Status:   Future    Standing Expiration Date:   01/30/2023   CMP ([place] only)    Standing Status:   Future    Standing Expiration Date:   01/30/2023   T4    Standing Status:   Future    Standing Expiration Date:   01/30/2023   TSH    Standing Status:   Future    Standing Expiration Date:   01/30/2023  CBC with Differential (Cancer Center Only)    Standing Status:   Future    Standing Expiration Date:   03/13/2023   CMP (Cancer Center only)    Standing Status:   Future    Standing Expiration Date:   03/13/2023   T4    Standing Status:   Future    Standing Expiration Date:   03/13/2023   TSH    Standing Status:   Future    Standing Expiration Date:   03/13/2023   CBC with Differential ([place] Only)    Standing Status:   Future    Standing Expiration Date:   04/24/2023   CMP ([place] only)    Standing Status:   Future    Standing Expiration Date:   04/24/2023   T4    Standing Status:   Future    Standing Expiration Date:   04/24/2023   TSH    Standing Status:   Future    Standing Expiration Date:   04/24/2023    The total time spent in the appointment was 80 minutes encounter with patients including review of chart and various tests results, discussions about plan of care and coordination of care plan   All questions were answered. The patient knows to call the clinic with any problems, questions or concerns. No barriers to  learning was detected.  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 female is here because of recent diagnosis of uterine cancer She is here accompanied by her daughters She presented with postmenopausal bleeding leading to subsequent diagnosis At baseline, she is somewhat sedentary She has chronic bilateral lower extremity edema Her oral intake is poor  I have reviewed her chart and materials related to her cancer extensively and collaborated history with the patient. Summary of oncologic history is as follows: Oncology History Overview Note  Outside path showed carcinosarcoma, PD-L1 positive, MSI stable   Uterine cancer ([place])  07/30/2021 Initial Diagnosis   She was evaluated for post-menopausal bleeding. She reported having been seen a year previously and having a biopsy that was negative.  On exam in clinic, she was noted to have a mass present within the vagina.  This was thought to be a prolapsing fibroid and plan was made for exam under anesthesia, removal prolapsing fibroid with hysteroscopy and D&C   08/11/2021 Pathology Results   PD-L1 [phone]):                                             Does meet FDA-approval, Combined Positive Score (CPS): >=1   Pan-TRK IHC:                                              Tumor cells are Focally Positive for TRK protein expression.   MICROSATELLITE INSTABILITY:             MS-Stable   TUMOR MUTATION BURDEN:                10.8 Muts/Mb TMB-Low   OTHER BIOMARKERS:  MET amplification PIK3CA amplification MDM2 amplification CCNE1 amplification   Pertinent Negative Biomarkers Evaluated by NGS: ALK ATM BARD1 BRAF BRCA1 BRCA2 CDK12 CHECK1 CHEK2 EGFR  ERBB2 ESR1 FANCL FGFR2 FGFR3 IDH1 IDH2 KIT KRAS NRAS NTRK1 NTRK2 NTRK3 PALB2 PDGFRA RAD51B RAD51C  RAD51D RAD54L  RET ROS1   Comment:  The above studies are performed and reported by [facility] [place]) as part of the [employer] (NGS) panel.  Pertinent results are summarized above.  Please see their separate reports for more detailed information  Addendum electronically signed by Elsworth Soho, MD on 09/03/2021 at  7:37 AM  Final Diagnosis    A.  CERVICAL MASS, EXCISION:       - CONSISTENT WITH CARCINOSARCOMA (HIGH GRADE MALIGNANT BIPHASIC TUMOR)  Electronically signed by Elsworth Soho, MD on 08/15/2021 at  9:53 AM  Comment    The malignant epithelial component exhibits high-grade cytomorphology and immunostaining pattern consistent with serous carcinoma to include strong positive diffuse reactivity for p53, p16, and vimentin, focal subset CK7 and CK AE1/AE3 expression, with high Ki-67 staining (approximately 60%).  Neuroendocrine differentiation is present (CD56 negative but synaptophysin positive).  The stromal component is composed of malignant mitotically active cells and heterologous elements with focal chondroid differentiation (S100 positive).  SMA appears to decorate rare tumor cells and highlights vascular structures.  The tumor cells are non-reactive for CK20, WT1, GATA3, p40 and desmin.  Block A5 is referred to [facility], [place]) to perform molecular characterization.  These findings are shared with Dr. Luanne Bras on [phone] at [phone] hours by telephone conversation. This case received prospective intradepartmental quality assurance review.   Clinical Information    Cervical mass  Gross Description    A. Cervix Received fresh labeled with the patient's name and date of birth and "cervical mass frozen section" per container is an aggregate of red-tan friable soft tissue measuring approximately 5 x 5 x 3 cm in toto.  Representative sections are frozen for an intraoperative consultation.  Representative sections are submitted as follows:  A1-A2: Frozen section remnants A3-A5:  Additional sections  Time of formalin addition: 08/11/2021 8:12 AM    Intraoperative Consultation    A. 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   Results rendered by Dr. Wyatt Portela and reported to Dr. Macarthur Critchley at [facility] on 08/11/2021 at 8:06 AM.  Microscopic Description    Microscopic examination supports the above diagnosis.  Single antibody immunostains are performed to include: S100, p40, p16, p53, PAX8, CK7, CK20, vimentin, CK AE1/3, GATA3, WT1, desmin, SMA, synaptophysin, CD56, and Ki-67.  All controls are appropriately reactive. [id number], [id number], [id number], [id number], [id number], [id number] (15)          08/22/2021 Initial Diagnosis   Carcinosarcoma ([place])   09/01/2021 Imaging   CT abdomen and pelvis There is marked enlargement of the uterus measuring 13.7 x 10 x 8 x 10.1 cm. There is inhomogeneous enhancement in the uterus consistent with malignant neoplasm.   There are enlarged lymph nodes in the retroperitoneum in the para-and paracaval regions measuring up to 12 mm in short axis. There are a few enlarged lymph nodes adjacent to the iliac vessels largest measuring 2.6 x 1.4 cm adjacent to the right external iliac vessels. Findings suggest possible metastatic lymphadenopathy. There is mild prominence of the pelvocaliceal system in the left kidney and proximal left ureter. Possibility of extrinsic compression or infiltrative process related to the uterine neoplasm in the distal course of left ureter causing mild obstruction  is not excluded.   Gallbladder stone.  Diverticulosis of colon.   Other findings as described in the body of the report.   09/01/2021 Imaging   US pelvis Large central uterine ill-defined mass approximately 7.7 cm diameter enlarging uterus likely representing the biopsy-proven neoplasm, likely of endometrial origin.   Questionable intramural leiomyoma posterior mid uterus 3.1 cm diameter.   The mass extend and the adnexal regions are poorly  characterized but may potentially be better evaluated by MR imaging with and without contrast.   09/10/2021 Surgery   Pre-operative Diagnosis: carcinosarcoma of the uterus, adenopathy on imaging   Post-operative Diagnosis: same, stage IVB carcinosarcoma of the uterus   Operation: Diagnostic laparoscopy    Surgeon: Jeral Pinch MD Operative Findings: On EUA, cervix 2 cm dilated with tumor noted within the os. Cervix otherwise normal appearing. Uterus enlarged, 12 cm, with limited mobility secondary to weight. Compression of the rectum from uterus although no direct invasion appreciated. On intra-abdominal inspection, adhesions noted between the liver and the anterior abdominal wall. Evidence of carcinomatosis involving liver surface, diaphragm, omentum, anterior abdominal wall. Surface of the uterus with obvious tumor infiltration. Small bowel free from uterus. Posteriorly, sigmoid densely adherent to the posterior uterus. Even with manipulator, unable to move the uterus anteriorly. Minimal ascites.    Given findings of stage IV disease and what would require large bowel resection and end ostomy, decision made to abort surgery in favor of neoadjuvant chemotherapy. I had discussed this as a distinct possibility when we had gotten her CT results as well as this morning prior to the surgery.    09/15/2021 Cancer Staging   Staging form: Corpus Uteri - Carcinoma and Carcinosarcoma, AJCC 8th Edition - Clinical stage from 09/15/2021: FIGO Stage IVB (cT3, cN2a, pM1) - Signed by Heath Lark, MD on 09/15/2021 Stage prefix: Initial diagnosis   09/24/2021 -  Chemotherapy   Patient is on Treatment Plan : UTERINE ENDOMETRIAL Dostarlimab-gxly (500 mg) + Carboplatin (AUC 5) + Paclitaxel (175 mg/m2) q21d x 6 cycles / Dostarlimab-gxly (1000 mg) q42d x 6 cycles        MEDICAL HISTORY:  Past Medical History:  Diagnosis Date   Arthritis    Fibroid tumor    Hx of mammogram 2022   [place]   Hypercholesteremia     Hypertension    Post-menopausal bleeding    Pre-diabetes     SURGICAL HISTORY: Past Surgical History:  Procedure Laterality Date   EXAMINATION UNDER ANESTHESIA  08/11/2021   with cervical mass biopsies   LAPAROSCOPY N/A 09/10/2021   Procedure: DIAGNOSTIC LAPAROSCOPY;  Surgeon: Lafonda Mosses, MD;  Location: [facility];  Service: Gynecology;  Laterality: N/A;   REPLACEMENT TOTAL KNEE BILATERAL     TUBAL LIGATION      SOCIAL HISTORY: Social History   Socioeconomic History   Marital status: Single    Spouse name: Not on file   Number of children: Not on file   Years of education: Not on file   Highest education level: Not on file  Occupational History   Not on file  Tobacco Use   Smoking status: Never    Passive exposure: Never   Smokeless tobacco: Never  Vaping Use   Vaping Use: Never used  Substance and Sexual Activity   Alcohol use: Never   Drug use: Never   Sexual activity: Not Currently  Other Topics Concern   Not on file  Social History Narrative   Not on file   Social Determinants of  Health   Financial Resource Strain: Not on file  Food Insecurity: Not on file  Transportation Needs: Not on file  Physical Activity: Not on file  Stress: Not on file  Social Connections: Not on file  Intimate Partner Violence: Not on file    FAMILY HISTORY: Family History  Problem Relation Age of Onset   Breast cancer Sister        half-sister    ALLERGIES:  is allergic to hydrocodone-acetaminophen and oxycodone-acetaminophen.  MEDICATIONS:  Current Outpatient Medications  Medication Sig Dispense Refill   dexamethasone (DECADRON) 4 MG tablet Take 2 tablets at the night before and 2 tablets in the morning of chemotherapy, every 3 weeks, for 6 cycles 36 tablet 6   allopurinol (ZYLOPRIM) 300 MG tablet Take 300 mg by mouth daily.     amLODipine (NORVASC) 5 MG tablet Take 5 mg by mouth daily.     atenolol (TENORMIN) 50 MG tablet Take 50 mg by mouth daily.      atorvastatin (LIPITOR) 10 MG tablet Take 10 mg by mouth daily.     cholecalciferol (VITAMIN D3) 25 MCG (1000 UNIT) tablet Take 1,000 Units by mouth daily.     lidocaine-prilocaine (EMLA) cream Apply to affected area once 30 g 3   meloxicam (MOBIC) 7.5 MG tablet Take 7.5 mg by mouth daily.     ondansetron (ZOFRAN) 8 MG tablet Take 1 tablet by mouth every 8 hours as needed for nausea or vomiting. Start on the third day after chemotherapy. 30 tablet 1   prochlorperazine (COMPAZINE) 10 MG tablet Take 1 tablet (10 mg total) by mouth every 6 (six) hours as needed for nausea or vomiting. 30 tablet 1   senna-docusate (SENOKOT-S) 8.6-50 MG tablet Take 2 tablets by mouth at bedtime. For AFTER surgery, do not take if having diarrhea 30 tablet 0   traMADol (ULTRAM) 50 MG tablet Take 1 tablet (50 mg total) by mouth every 6 (six) hours as needed for severe pain. For AFTER surgery only, do not take and drive 10 tablet 0   No current facility-administered medications for this visit.    REVIEW OF SYSTEMS:   Constitutional: Denies fevers, chills or abnormal night sweats Eyes: Denies blurriness of vision, double vision or watery eyes Ears, nose, mouth, throat, and face: Denies mucositis or sore throat Respiratory: Denies cough, dyspnea or wheezes Cardiovascular: Denies palpitation, chest discomfort or lower extremity swelling Gastrointestinal:  Denies nausea, heartburn or change in bowel habits Skin: Denies abnormal skin rashes Lymphatics: Denies new lymphadenopathy or easy bruising Neurological:Denies numbness, tingling or new weaknesses Behavioral/Psych: Mood is stable, no new changes  All other systems were reviewed with the patient and are negative.  PHYSICAL EXAMINATION: ECOG PERFORMANCE STATUS: 2 - Symptomatic, <50% confined to bed  Vitals:   09/17/21 0906  BP: 131/76  Pulse: 74  Resp: 16  Temp: 97.7 F (36.5 C)  SpO2: 100%   Filed Weights   09/17/21 0906  Weight: 225 lb 6.4 oz (102.2 kg)     GENERAL:alert, no distress and comfortable SKIN: skin color, texture, turgor are normal, no rashes or significant lesions EYES: normal, conjunctiva are pink and non-injected, sclera clear OROPHARYNX:no exudate, no erythema and lips, buccal mucosa, and tongue normal  NECK: supple, thyroid normal size, non-tender, without nodularity LYMPH:  no palpable lymphadenopathy in the cervical, axillary or inguinal LUNGS: clear to auscultation and percussion with normal breathing effort HEART: regular rate & rhythm and no murmurs with moderate bilateral lower extremity edema ABDOMEN:abdomen soft,  non-tender and normal bowel sounds Musculoskeletal:no cyanosis of digits and no clubbing  PSYCH: alert & oriented x 3 with fluent speech NEURO: no focal motor/sensory deficits  LABORATORY DATA:  I have reviewed the data as listed Lab Results  Component Value Date   WBC 9.7 09/01/2021   HGB 9.7 (L) 09/01/2021   HCT 31.9 (L) 09/01/2021   MCV 86.0 09/01/2021   PLT 401 (H) 09/01/2021   Recent Labs    08/22/21 1028 09/01/21 1137  NA 139 137  K 3.4* 4.3  CL 102 102  CO2 29 26  GLUCOSE 109* 104*  BUN 11 10  CREATININE 0.90 1.01*  CALCIUM 9.4 9.3  GFRNONAA >60 59*  PROT  --  7.9  ALBUMIN  --  3.2*  AST  --  19  ALT  --  10  ALKPHOS  --  59  BILITOT  --  0.7    RADIOGRAPHIC STUDIES: I have personally reviewed the radiological images as listed and agreed with the findings in the report. CT Abdomen Pelvis W Contrast  Result Date: 09/02/2021 CLINICAL DATA:  Uterine carcinoma, staging EXAM: CT ABDOMEN AND PELVIS WITH CONTRAST TECHNIQUE: Multidetector CT imaging of the abdomen and pelvis was performed using the standard protocol following bolus administration of intravenous contrast. RADIATION DOSE REDUCTION: This exam was performed according to the departmental dose-optimization program which includes automated exposure control, adjustment of the mA and/or kV according to patient size and/or use  of iterative reconstruction technique. CONTRAST:  120m OMNIPAQUE IOHEXOL 300 MG/ML  SOLN COMPARISON:  None Available. FINDINGS: Lower chest: Visualized lower lung fields are unremarkable. Hepatobiliary: No focal abnormalities are seen in the liver. There is no dilation of bile ducts. There is faint increased density in the lumen of gallbladder suggesting gallbladder stone. There is no wall thickening in gallbladder. Pancreas: No focal abnormalities are seen. Spleen: Unremarkable. Adrenals/Urinary Tract: Adrenals are unremarkable. There is no hydronephrosis in right kidney. There is slight prominence of the pelvocaliceal system in the left kidney. There is slight prominence of proximal left ureter. There are no renal or ureteral stones. There is possible subcentimeter cyst in the midportion of right kidney. There is mild stranding in the fat planes adjacent to the distal course of left ureter at the level of pelvic inlet. There is mild wall thickening in the left ureter at the level of pelvic inlet. Left ureter close to the ureterovesical junction is not dilated. Urinary bladder is unremarkable. Stomach/Bowel: Stomach is unremarkable. Small bowel loops are not dilated. Appendix is not dilated. There is no significant wall thickening in colon. Scattered diverticula are seen in colon without signs of focal acute diverticulitis. Vascular/Lymphatic: Vascular structures are unremarkable. There are enlarged lymph nodes and retroperitoneum largest measuring 12 mm in short axis anterior to the proximal course inferior vena cava. There is enlarged lymph node adjacent to the right external iliac vessels measuring 11 mm in short axis. There is 2.6 x 1.4 cm node in right external iliac chain. There is 1.4 x 0.6 cm node in the left external iliac chain. Reproductive: Uterus is enlarged in size measuring approximately 13.7 x 10.8 x 10.1 cm. There is inhomogeneous enhancement in the central portion of the uterus suggesting  possible malignant neoplastic process in the endometrium. There is stranding in the fat planes adjacent to the upper posterior margin of enlarged uterus. Other: There is no ascites or pneumoperitoneum. Umbilical hernia containing fat is seen. There is a 12 mm soft tissue density in the right  lateral margin of the umbilical hernia. Significance of this finding is not clear. There is no stranding in the adjacent fat planes. Musculoskeletal: Degenerative changes are noted in lumbar spine with spinal stenosis and encroachment of neural foramina at multiple levels, particularly severe at L3-L4 and L4-L5 levels. No focal lytic or sclerotic lesions are seen in bony structures. IMPRESSION: There is marked enlargement of the uterus measuring 13.7 x 10 x 8 x 10.1 cm. There is inhomogeneous enhancement in the uterus consistent with malignant neoplasm. There are enlarged lymph nodes in the retroperitoneum in the para-and paracaval regions measuring up to 12 mm in short axis. There are a few enlarged lymph nodes adjacent to the iliac vessels largest measuring 2.6 x 1.4 cm adjacent to the right external iliac vessels. Findings suggest possible metastatic lymphadenopathy. There is mild prominence of the pelvocaliceal system in the left kidney and proximal left ureter. Possibility of extrinsic compression or infiltrative process related to the uterine neoplasm in the distal course of left ureter causing mild obstruction is not excluded. Gallbladder stone.  Diverticulosis of colon. Other findings as described in the body of the report. Electronically Signed   By: Elmer Picker M.D.   On: 09/02/2021 10:09   US PELVIC COMPLETE WITH TRANSVAGINAL  Result Date: 09/02/2021 CLINICAL DATA:  Prolapsing mass question uterine versus endocervical, biopsy-proven carcinosarcoma, vaginal bleeding for 1 month EXAM: TRANSABDOMINAL AND TRANSVAGINAL ULTRASOUND OF PELVIS TECHNIQUE: Both transabdominal and transvaginal ultrasound examinations  of the pelvis were performed. Transabdominal technique was performed for global imaging of the pelvis including uterus, ovaries, adnexal regions, and pelvic cul-de-sac. It was necessary to proceed with endovaginal exam following the transabdominal exam to visualize the endometrium and ovaries. Assessment of ovaries limited by bowel gas in the adnexal regions bilaterally. COMPARISON:  None Available. FINDINGS: Uterus Measurements: 13.3 x 7.9 x 8.9 cm = volume: 492 mL. Anteverted. Heterogeneous appearance. Large central mass, see below. Cervix is not appear enlarged. Hypoechoic mass posterior mid uterus, question 3.1 cm intramural leiomyoma. Endometrium No normal appearing endometrial lining identified. Centrally within the uterus a large mass is seen, heterogeneous, approximately 7.0 x 7.7 x 6.1 cm. Mass and endometrial complex are ill-defined. This likely represents the biopsy-proven neoplasm with epicenter reflecting endometrial rather than cervical origin. Right ovary Not visualized, likely obscured by bowel Left ovary Not visualized, likely obscured by bowel Other findings No free pelvic fluid or adnexal masses. IMPRESSION: Large central uterine ill-defined mass approximately 7.7 cm diameter enlarging uterus likely representing the biopsy-proven neoplasm, likely of endometrial origin. Questionable intramural leiomyoma posterior mid uterus 3.1 cm diameter. The mass extend and the adnexal regions are poorly characterized but may potentially be better evaluated by MR imaging with and without contrast. Electronically Signed   By: Lavonia Dana M.D.   On: 09/02/2021 08:49

## 2021-09-17 NOTE — Assessment & Plan Note (Signed)
I recommend elastic compression hose, frequent ambulation and leg elevation as much as possible

## 2021-09-17 NOTE — Assessment & Plan Note (Signed)
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 The patient denies recent history of bleeding such as epistaxis, hematuria or hematochezia. She is asymptomatic from the anemia. We will observe for now.  She does not require transfusion now. I do not recommend any further work-up at this time.   

## 2021-09-17 NOTE — Assessment & Plan Note (Signed)
I have reviewed imaging studies with the patient and her daughters We discussed the role of chemotherapy  We reviewed the NCCN guidelines I recommend treatment based on publication as follows:  Dostarlimab for Primary Advanced or Recurrent Endometrial Cancer  Mansoor Reola Mosher, M.D., Edwinna Areola. Cheri Rous, M.D., Virl Cagey. Slomovitz, M.D., Ren Loraine Maple, Ph.D., Suzie Portela, Ph.D., Para Skeans, M.D., Moises Blood, M.D., Annye Rusk, M.D., Verneda Skill, M.D., Vilinda Blanks. Carmin Muskrat, M.D., Ph.D., Aloha Gell. Romero Belling, M.D., Evlyn Clines, M.D., et al., for the [place] [phone] DOI: 10.1056/[id number]  BACKGROUND Dostarlimab is an immune-checkpoint inhibitor that targets the programmed cell death 1 receptor. The combination of chemotherapy and immunotherapy may have synergistic effects in the treatment of endometrial cancer.  METHODS We conducted a phase 3, global, double-blind, randomized, placebo-controlled trial. Eligible patients with primary advanced stage III or IV or first recurrent endometrial cancer were randomly assigned in a 1:1 ratio to receive either dostarlimab (500 mg) or placebo, plus carboplatin (area under the concentration-time curve, 5 mg per milliliter per minute) and paclitaxel (175 mg per square meter of body-surface area), every 3 weeks (six cycles), followed by dostarlimab (1000 mg) or placebo every 6 weeks for up to 3 years. The primary end points were progression-free survival as assessed by the investigator according to Response Evaluation Criteria in Solid Tumors (RECIST), version 1.1, and overall survival. Safety was also assessed.  RESULTS Of the 494 patients who underwent randomization, 118 (23.9%) had mismatch repair-deficient (dMMR), microsatellite instability-high (MSI-H) tumors. In the dMMR-MSI-H population, estimated progression-free survival at 24 months was 61.4% (95% confidence interval [CI], 46.3 to 73.4) in the  dostarlimab group and 15.7% (95% CI, 7.2 to 27.0) in the placebo group (hazard ratio for progression or death, 0.28; 95% CI, 0.16 to 0.50; P<0.001). In the overall population, progression-free survival at 24 months was 36.1% (95% CI, 29.3 to 42.9) in the dostarlimab group and 18.1% (95% CI, 13.0 to 23.9) in the placebo group (hazard ratio, 0.64; 95% CI, 0.51 to 0.80; P<0.001). Overall survival at 24 months was 71.3% (95% CI, 64.5 to 77.1) with dostarlimab and 56.0% (95% CI, 48.9 to 62.5) with placebo (hazard ratio for death, 0.64; 95% CI, 0.46 to 0.87). The most common adverse events that occurred or worsened during treatment were nausea (53.9% of the patients in the dostarlimab group and 45.9% of those in the placebo group), alopecia (53.5% and 50.0%), and fatigue (51.9% and 54.5%). Severe and serious adverse events were more frequent in the dostarlimab group than in the placebo group.  CONCLUSIONS Dostarlimab plus carboplatin-paclitaxel significantly increased progression-free survival among patients with primary advanced or recurrent endometrial cancer, with a substantial benefit in the dMMR-MSI-H population. (Funded by [employer]; [employer].gov number, ZSM27078675)  The risks, benefits, side effects of treatment is discussed with the patient and she agreed to proceed with plan of care.  She is scheduled for port placement next week I recommend blood work, chemo education class and treatment to start next week I will see her back the following week for toxicity review I recommend minimum 3 cycles of treatment before we repeat imaging study

## 2021-09-17 NOTE — Assessment & Plan Note (Signed)
She has poor oral intake I am concerned about risk of dehydration I recommend discontinuation of hydrochlorothiazide and to increase oral fluid as tolerated

## 2021-09-17 NOTE — Assessment & Plan Note (Signed)
Patient is sedentary She is somewhat obese I believe fluid retention in her legs are contributing to her reduced mobility and sedentary lifestyle

## 2021-09-17 NOTE — Progress Notes (Signed)
START OFF PATHWAY REGIMEN - Uterine   [id number]:Carboplatin AUC=5 IV D1 + Dostarlimab 500 mg IV D1 + Paclitaxel 175 mg/m2 IV D1 x 6 Cycles Followed by Dostarlimab 1,000 mg IV D1 q42 Days:   Cycles 1 through 6: A cycle is every 21 days:     Dostarlimab-gxly      Paclitaxel      Carboplatin    Cycles 7 and beyond: A cycle is every 42 days:     Dostarlimab-gxly   **Always confirm dose/schedule in your pharmacy ordering system**  Patient Characteristics: Carcinosarcoma, Newly Diagnosed (Clinical Staging), Nonsurgical Candidate, Symptomatic, Stage III/IV, MSS/pMMR Histology: Carcinosarcoma Therapeutic Status: Newly Diagnosed (Clinical Staging) AJCC M Category: pM1 Surgical Candidacy: Nonsurgical Candidate AJCC 8 Stage Grouping: IVB AJCC T Category: cT3 AJCC N Category: cN2 Asymptomatic or Symptomatic<= Symptomatic Microsatellite/Mismatch Repair Status: MSS/pMMR  Intent of Therapy: Curative Intent, Discussed with Patient

## 2021-09-18 ENCOUNTER — Other Ambulatory Visit: Payer: Self-pay

## 2021-09-18 ENCOUNTER — Telehealth: Payer: Self-pay

## 2021-09-18 ENCOUNTER — Telehealth: Payer: Self-pay | Admitting: Radiology

## 2021-09-18 NOTE — Telephone Encounter (Signed)
Returned call to daughter. She is requesting e-mail address of [facility] department. Given address [email or web address]. She verbalized understanding.

## 2021-09-18 NOTE — Telephone Encounter (Signed)
[id number]: A RANDOMIZED TRIAL ADDRESSING CANCER-RELATED FINANCIAL HARDSHIP THROUGH DELIVERY OF A PROACTIVE FINANCIAL NAVIGATION INTERVENTION (CREDIT)   A Randomized pragmatic Chair-Based Home Exercise Intervention for Mitigating Cancer-Related Fatigue in Older Adults Undergoing Chemotherapy for Advanced Disease   09/18/21  PHONE CALL: Spoke with patient's daughter, Heath Lark and Ms. Annamarie Major. Informed patient reason for call was to introduce the above mentioned studies. Gave patient and daughter a brief overview of the above mentioned studies and informed them of the voluntary nature of studies. Patient and daughter are interested in learning more about the studies, primarily the exercise based study. E-mailed consent forms to e-mail provided by daughter. Plan to speak with patient and family next week. Thanked patient and her daughter for their time. I look forward to meeting them next week.   Carol Ada, RT(R)(T) Clinical Research Coordinator

## 2021-09-19 ENCOUNTER — Other Ambulatory Visit: Payer: Self-pay

## 2021-09-21 NOTE — H&P (Signed)
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 female with PMH of fibroid tumor, hypercholesteremia, HTN and prediabetes.  Patient followed by gynecology for recent vaginal bleeding, abdominal pain, abdominal bloating and decrease in appetite.  On exam 07/30/2021 patient was found to have mass present within the vagina.  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  Patient has been referred to IR by Heath Lark, MD for tunneled catheter with port placement to begin chemotherapy.  Past Medical History:  Diagnosis Date   Arthritis    Fibroid tumor    Hx of mammogram 2022   [place]   Hypercholesteremia    Hypertension    Post-menopausal bleeding    Pre-diabetes     Past Surgical History:  Procedure Laterality Date   EXAMINATION UNDER ANESTHESIA  08/11/2021   with cervical mass biopsies   LAPAROSCOPY N/A 09/10/2021   Procedure: DIAGNOSTIC LAPAROSCOPY;  Surgeon: Lafonda Mosses, MD;  Location: [facility] ORS;  Service: Gynecology;  Laterality: N/A;   REPLACEMENT TOTAL KNEE BILATERAL     TUBAL LIGATION      Allergies: Hydrocodone-acetaminophen and Oxycodone-acetaminophen  Medications: Prior to Admission medications   Medication Sig Start Date End Date Taking? Authorizing Provider  allopurinol (ZYLOPRIM) 300 MG tablet Take 300 mg by mouth daily.    [provider]  amLODipine (NORVASC) 5 MG tablet Take 5 mg by mouth daily.    [provider]  atenolol (TENORMIN) 50 MG tablet Take 50 mg by mouth daily.    [provider]  atorvastatin (LIPITOR) 10 MG tablet Take 10 mg by mouth daily.    [provider]  cholecalciferol (VITAMIN D3) 25 MCG (1000 UNIT) tablet  Take 1,000 Units by mouth daily.    [provider]  dexamethasone (DECADRON) 4 MG tablet Take 2 tablets at the night before and 2 tablets in the morning of chemotherapy, every 3 weeks, for 6 cycles 09/17/21   Heath Lark, MD  lidocaine-prilocaine (EMLA) cream Apply to affected area once 09/17/21   Heath Lark, MD  meloxicam (MOBIC) 7.5 MG tablet Take 7.5 mg by mouth daily.    [provider]  ondansetron (ZOFRAN) 8 MG tablet Take 1 tablet by mouth every 8 hours as needed for nausea or vomiting. Start on the third day after chemotherapy. 09/17/21   Heath Lark, MD  prochlorperazine (COMPAZINE) 10 MG tablet Take 1 tablet (10 mg total) by mouth every 6 (six) hours as needed for nausea or vomiting. 09/17/21   Heath Lark, MD  senna-docusate (SENOKOT-S) 8.6-50 MG tablet Take 2 tablets by mouth at bedtime. For AFTER surgery, do not take if having diarrhea 08/22/21   Joylene John D, NP  traMADol (ULTRAM) 50 MG tablet Take 1 tablet (50 mg total) by mouth every 6 (six) hours as needed for severe pain. For AFTER surgery only, do not take and drive 0/09/38   Dorothyann Gibbs, NP     Family History  Problem Relation Age of Onset   Breast cancer Sister        half-sister    Social History   Socioeconomic History   Marital status: Single    Spouse name: Not on file   Number of children: Not on file   Years  of education: Not on file   Highest education level: Not on file  Occupational History   Not on file  Tobacco Use   Smoking status: Never    Passive exposure: Never   Smokeless tobacco: Never  Vaping Use   Vaping Use: Never used  Substance and Sexual Activity   Alcohol use: Never   Drug use: Never   Sexual activity: Not Currently  Other Topics Concern   Not on file  Social History Narrative   Not on file   Social Determinants of Health   Financial Resource Strain: Not on file  Food Insecurity: Not on file  Transportation Needs: Not on file  Physical Activity: Not on file   Stress: Not on file  Social Connections: Not on file    Review of Systems: A 12 point ROS discussed and pertinent positives are indicated in the HPI above.  All other systems are negative.  Review of Systems  Vital Signs: There were no vitals taken for this visit.    Physical Exam  Imaging: CT Abdomen Pelvis W Contrast  Result Date: 09/02/2021 CLINICAL DATA:  Uterine carcinoma, staging EXAM: CT ABDOMEN AND PELVIS WITH CONTRAST TECHNIQUE: Multidetector CT imaging of the abdomen and pelvis was performed using the standard protocol following bolus administration of intravenous contrast. RADIATION DOSE REDUCTION: This exam was performed according to the departmental dose-optimization program which includes automated exposure control, adjustment of the mA and/or kV according to patient size and/or use of iterative reconstruction technique. CONTRAST:  123m OMNIPAQUE IOHEXOL 300 MG/ML  SOLN COMPARISON:  None Available. FINDINGS: Lower chest: Visualized lower lung fields are unremarkable. Hepatobiliary: No focal abnormalities are seen in the liver. There is no dilation of bile ducts. There is faint increased density in the lumen of gallbladder suggesting gallbladder stone. There is no wall thickening in gallbladder. Pancreas: No focal abnormalities are seen. Spleen: Unremarkable. Adrenals/Urinary Tract: Adrenals are unremarkable. There is no hydronephrosis in right kidney. There is slight prominence of the pelvocaliceal system in the left kidney. There is slight prominence of proximal left ureter. There are no renal or ureteral stones. There is possible subcentimeter cyst in the midportion of right kidney. There is mild stranding in the fat planes adjacent to the distal course of left ureter at the level of pelvic inlet. There is mild wall thickening in the left ureter at the level of pelvic inlet. Left ureter close to the ureterovesical junction is not dilated. Urinary bladder is unremarkable.  Stomach/Bowel: Stomach is unremarkable. Small bowel loops are not dilated. Appendix is not dilated. There is no significant wall thickening in colon. Scattered diverticula are seen in colon without signs of focal acute diverticulitis. Vascular/Lymphatic: Vascular structures are unremarkable. There are enlarged lymph nodes and retroperitoneum largest measuring 12 mm in short axis anterior to the proximal course inferior vena cava. There is enlarged lymph node adjacent to the right external iliac vessels measuring 11 mm in short axis. There is 2.6 x 1.4 cm node in right external iliac chain. There is 1.4 x 0.6 cm node in the left external iliac chain. Reproductive: Uterus is enlarged in size measuring approximately 13.7 x 10.8 x 10.1 cm. There is inhomogeneous enhancement in the central portion of the uterus suggesting possible malignant neoplastic process in the endometrium. There is stranding in the fat planes adjacent to the upper posterior margin of enlarged uterus. Other: There is no ascites or pneumoperitoneum. Umbilical hernia containing fat is seen. There is a 12 mm soft tissue density in  the right lateral margin of the umbilical hernia. Significance of this finding is not clear. There is no stranding in the adjacent fat planes. Musculoskeletal: Degenerative changes are noted in lumbar spine with spinal stenosis and encroachment of neural foramina at multiple levels, particularly severe at L3-L4 and L4-L5 levels. No focal lytic or sclerotic lesions are seen in bony structures. IMPRESSION: There is marked enlargement of the uterus measuring 13.7 x 10 x 8 x 10.1 cm. There is inhomogeneous enhancement in the uterus consistent with malignant neoplasm. There are enlarged lymph nodes in the retroperitoneum in the para-and paracaval regions measuring up to 12 mm in short axis. There are a few enlarged lymph nodes adjacent to the iliac vessels largest measuring 2.6 x 1.4 cm adjacent to the right external iliac  vessels. Findings suggest possible metastatic lymphadenopathy. There is mild prominence of the pelvocaliceal system in the left kidney and proximal left ureter. Possibility of extrinsic compression or infiltrative process related to the uterine neoplasm in the distal course of left ureter causing mild obstruction is not excluded. Gallbladder stone.  Diverticulosis of colon. Other findings as described in the body of the report. Electronically Signed   By: Elmer Picker M.D.   On: 09/02/2021 10:09   US PELVIC COMPLETE WITH TRANSVAGINAL  Result Date: 09/02/2021 CLINICAL DATA:  Prolapsing mass question uterine versus endocervical, biopsy-proven carcinosarcoma, vaginal bleeding for 1 month EXAM: TRANSABDOMINAL AND TRANSVAGINAL ULTRASOUND OF PELVIS TECHNIQUE: Both transabdominal and transvaginal ultrasound examinations of the pelvis were performed. Transabdominal technique was performed for global imaging of the pelvis including uterus, ovaries, adnexal regions, and pelvic cul-de-sac. It was necessary to proceed with endovaginal exam following the transabdominal exam to visualize the endometrium and ovaries. Assessment of ovaries limited by bowel gas in the adnexal regions bilaterally. COMPARISON:  None Available. FINDINGS: Uterus Measurements: 13.3 x 7.9 x 8.9 cm = volume: 492 mL. Anteverted. Heterogeneous appearance. Large central mass, see below. Cervix is not appear enlarged. Hypoechoic mass posterior mid uterus, question 3.1 cm intramural leiomyoma. Endometrium No normal appearing endometrial lining identified. Centrally within the uterus a large mass is seen, heterogeneous, approximately 7.0 x 7.7 x 6.1 cm. Mass and endometrial complex are ill-defined. This likely represents the biopsy-proven neoplasm with epicenter reflecting endometrial rather than cervical origin. Right ovary Not visualized, likely obscured by bowel Left ovary Not visualized, likely obscured by bowel Other findings No free pelvic fluid  or adnexal masses. IMPRESSION: Large central uterine ill-defined mass approximately 7.7 cm diameter enlarging uterus likely representing the biopsy-proven neoplasm, likely of endometrial origin. Questionable intramural leiomyoma posterior mid uterus 3.1 cm diameter. The mass extend and the adnexal regions are poorly characterized but may potentially be better evaluated by MR imaging with and without contrast. Electronically Signed   By: Lavonia Dana M.D.   On: 09/02/2021 08:49    Labs:  CBC: Recent Labs    09/01/21 1137  WBC 9.7  HGB 9.7*  HCT 31.9*  PLT 401*    COAGS: No results for input(s): "INR", "APTT" in the last 8760 hours.  BMP: Recent Labs    08/22/21 1028 09/01/21 1137  NA 139 137  K 3.4* 4.3  CL 102 102  CO2 29 26  GLUCOSE 109* 104*  BUN 11 10  CALCIUM 9.4 9.3  CREATININE 0.90 1.01*  GFRNONAA >60 59*    LIVER FUNCTION TESTS: Recent Labs    09/01/21 1137  BILITOT 0.7  AST 19  ALT 10  ALKPHOS 59  PROT 7.9  ALBUMIN  3.2*    TUMOR MARKERS: No results for input(s): "AFPTM", "CEA", "CA199", "CHROMGRNA" in the last 8760 hours.  Assessment and Plan: History of fibroid tumor, hypercholesteremia, HTN and prediabetes.  Patient followed by gynecology for recent vaginal bleeding, abdominal pain, abdominal bloating and decrease in appetite.  On exam 07/30/2021 patient was found to have mass present within the vagina.  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  Patient has been referred to IR by Heath Lark, MD for tunneled catheter with port placement to begin chemotherapy. Risks and benefits of image guided tunneled catheter with port placement with moderate sedation was discussed with the patient including, but not limited to bleeding, infection, pneumothorax, or fibrin sheath development and need for additional procedures.  All of the patient's questions were answered, patient is agreeable to proceed. Consent signed  and in chart.   Thank you for this interesting consult.  I greatly enjoyed meeting Sarah Wu and look forward to participating in their care.  A copy of this report was sent to the requesting provider on this date.  Electronically Signed: Tyson Alias, NP 09/21/2021, 1:51 PM   I spent a total of {New BBCW:[id number]} {New Out-Pt:[id number]}  {Established Out-Pt:[id number]} in face to face in clinical consultation, greater than 50% of which was counseling/coordinating care for uterine carcinosarcoma.

## 2021-09-22 ENCOUNTER — Other Ambulatory Visit: Payer: Self-pay | Admitting: Hematology and Oncology

## 2021-09-22 ENCOUNTER — Other Ambulatory Visit: Payer: Self-pay | Admitting: Radiology

## 2021-09-22 NOTE — Progress Notes (Signed)
The pharmacy team has substituted IV diphenhydramine for IV cetirizine as a premedication. Patient will be monitored for hypersensitivity reaction and adverse reactions to IV cetirizine. Thanks.    Kennith Center, Pharm.D., CPP 09/22/2021'@4'$ :09 PM

## 2021-09-23 ENCOUNTER — Other Ambulatory Visit: Payer: Self-pay

## 2021-09-23 ENCOUNTER — Ambulatory Visit (HOSPITAL_COMMUNITY)
Admission: RE | Admit: 2021-09-23 | Discharge: 2021-09-23 | Disposition: A | Discharge status: Home / Self Care | Payer: Medicare Other | Source: Ambulatory Visit | Attending: Gynecologic Oncology | Admitting: Gynecologic Oncology

## 2021-09-23 ENCOUNTER — Inpatient Hospital Stay: Payer: Medicare Other

## 2021-09-23 ENCOUNTER — Encounter (HOSPITAL_COMMUNITY): Payer: Self-pay

## 2021-09-23 DIAGNOSIS — I10 Essential (primary) hypertension: Secondary | ICD-10-CM | POA: Diagnosis not present

## 2021-09-23 DIAGNOSIS — C801 Malignant (primary) neoplasm, unspecified: Secondary | ICD-10-CM | POA: Insufficient documentation

## 2021-09-23 DIAGNOSIS — R7303 Prediabetes: Secondary | ICD-10-CM | POA: Insufficient documentation

## 2021-09-23 DIAGNOSIS — E7800 Pure hypercholesterolemia, unspecified: Secondary | ICD-10-CM | POA: Insufficient documentation

## 2021-09-23 HISTORY — PX: IR IMAGING GUIDED PORT INSERTION: IMG5740

## 2021-09-23 LAB — COMPREHENSIVE METABOLIC PANEL
ALT: 12 U/L (ref 0–44)
AST: 20 U/L (ref 15–41)
Albumin: 3.6 g/dL (ref 3.5–5.0)
Alkaline Phosphatase: 50 U/L (ref 38–126)
Anion gap: 10 (ref 5–15)
BUN: 18 mg/dL (ref 8–23)
CO2: 23 mmol/L (ref 22–32)
Calcium: 9.4 mg/dL (ref 8.9–10.3)
Chloride: 107 mmol/L (ref 98–111)
Creatinine, Ser: 0.8 mg/dL (ref 0.44–1.00)
GFR, Estimated: >60 mL/min (ref >60)
Glucose, Bld: 157 mg/dL — ABNORMAL HIGH (ref 70–99)
Potassium: 4 mmol/L (ref 3.5–5.1)
Sodium: 140 mmol/L (ref 135–145)
Total Bilirubin: 0.4 mg/dL (ref 0.3–1.2)
Total Protein: 7.6 g/dL (ref 6.5–8.1)

## 2021-09-23 LAB — CBC WITH DIFFERENTIAL/PLATELET
Abs Immature Granulocytes: 0.02 10*3/uL (ref 0.00–0.07)
Basophils Absolute: 0 10*3/uL (ref 0.0–0.1)
Basophils Relative: 0 %
Eosinophils Absolute: 0 10*3/uL (ref 0.0–0.5)
Eosinophils Relative: 0 %
HCT: 31.5 % — ABNORMAL LOW (ref 36.0–46.0)
Hemoglobin: 9.4 g/dL — ABNORMAL LOW (ref 12.0–15.0)
Immature Granulocytes: 0 %
Lymphocytes Relative: 14 %
Lymphs Abs: 0.8 10*3/uL (ref 0.7–4.0)
MCH: 25.3 pg — ABNORMAL LOW (ref 26.0–34.0)
MCHC: 29.8 g/dL — ABNORMAL LOW (ref 30.0–36.0)
MCV: 84.7 fL (ref 80.0–100.0)
Monocytes Absolute: 0 10*3/uL — ABNORMAL LOW (ref 0.1–1.0)
Monocytes Relative: 1 %
Neutro Abs: 4.8 10*3/uL (ref 1.7–7.7)
Neutrophils Relative %: 85 %
Platelets: 323 10*3/uL (ref 150–400)
RBC: 3.72 MIL/uL — ABNORMAL LOW (ref 3.87–5.11)
RDW: 15.2 % (ref 11.5–15.5)
WBC: 5.6 10*3/uL (ref 4.0–10.5)
nRBC: 0 % (ref 0.0–0.2)

## 2021-09-23 MED ORDER — FENTANYL CITRATE (PF) 100 MCG/2ML IJ SOLN
INTRAMUSCULAR | Status: AC | PRN
  Administered 2021-09-23 (×2): 50 ug via INTRAVENOUS

## 2021-09-23 MED ORDER — HEPARIN SOD (PORK) LOCK FLUSH 100 UNIT/ML IV SOLN
INTRAVENOUS | Status: AC
  Filled 2021-09-23: qty 5

## 2021-09-23 MED ORDER — LIDOCAINE HCL 1 % IJ SOLN
INTRAMUSCULAR | Status: AC
  Filled 2021-09-23: qty 20

## 2021-09-23 MED ORDER — HEPARIN SOD (PORK) LOCK FLUSH 100 UNIT/ML IV SOLN
INTRAVENOUS | Status: AC | PRN
  Administered 2021-09-23: 500 [IU] via INTRAVENOUS

## 2021-09-23 MED ORDER — MIDAZOLAM HCL 2 MG/2ML IJ SOLN
INTRAMUSCULAR | Status: AC | PRN
  Administered 2021-09-23 (×3): 1 mg via INTRAVENOUS

## 2021-09-23 MED ORDER — FENTANYL CITRATE (PF) 100 MCG/2ML IJ SOLN
INTRAMUSCULAR | Status: AC
  Filled 2021-09-23: qty 2

## 2021-09-23 MED ORDER — MIDAZOLAM HCL 2 MG/2ML IJ SOLN
INTRAMUSCULAR | Status: AC
  Filled 2021-09-23: qty 2

## 2021-09-23 MED ORDER — LIDOCAINE HCL 1 % IJ SOLN
INTRAMUSCULAR | Status: AC | PRN
  Administered 2021-09-23: 20 mL

## 2021-09-23 MED ORDER — SODIUM CHLORIDE 0.9 % IV SOLN: INTRAVENOUS | Status: DC

## 2021-09-23 MED FILL — Fosaprepitant Dimeglumine For IV Infusion 150 MG (Base Eq): INTRAVENOUS | Qty: 5 | Status: AC

## 2021-09-23 MED FILL — Dexamethasone Sodium Phosphate Inj 100 MG/10ML: INTRAMUSCULAR | Qty: 1 | Status: AC

## 2021-09-23 NOTE — Procedures (Signed)
Interventional Radiology Procedure Note  Procedure: Placement of a right IJ approach single lumen PowerPort.  Tip is positioned at the superior cavoatrial junction and catheter is ready for immediate use.  Complications: None Recommendations:  - Ok to shower tomorrow - Do not submerge for 7 days - Routine line care   Signed,  Jola Critzer S. Mahogany Torrance, DO   

## 2021-09-23 NOTE — Discharge Instructions (Signed)
For questions /concerns may call Interventional Radiology at [phone] or  Interventional Radiology clinic [phone]   You may remove your dressing and shower tomorrow afternoon  DO NOT use EMLA cream for 2 weeks after port placement as the cream will remove surgical glue on your incision.     Implanted Port Insertion, Care After This sheet gives you information about how to care for yourself after your procedure. Your health care provider may also give you more specific instructions. If you have problems or questions, contact your health careprovider. What can I expect after the procedure? After the procedure, it is common to have: Discomfort at the port insertion site. Bruising on the skin over the port. This should improve over 3-4 days. Follow these instructions at home: Port care After your port is placed, you will get a manufacturer's information card. The card has information about your port. Keep this card with you at all times. Take care of the port as told by your health care provider. Ask your health care provider if you or a family member can get training for taking care of the port at home. A home health care nurse may also take care of the port. Make sure to remember what type of port you have. Incision care Follow instructions from your health care provider about how to take care of your port insertion site. Make sure you: Wash your hands with soap and water before and after you change your bandage (dressing). If soap and water are not available, use hand sanitizer. Change your dressing as told by your health care provider. Leave skin glue, or adhesive strips in place. These skin closures may need to stay in place for 2 weeks or longer.  Check your port insertion site every day for signs of infection. Check for:      - Redness, swelling, or pain.                     - Fluid or blood.      - Warmth.      - Pus or a bad smell. Activity Return to your normal  activities as told by your health care provider. Ask your health care provider what activities are safe for you. Do not lift anything that is heavier than 10 lb (4.5 kg), or the limit that you are told, until your health care provider says that it is safe. General instructions Take over-the-counter and prescription medicines only as told by your health care provider. Do not take baths, swim, or use a hot tub until your health care provider approves. Ask your health care provider if you may take showers. You may only be allowed to take sponge baths. Do not drive for 24 hours if you were given a sedative during your procedure. Wear a medical alert bracelet in case of an emergency. This will tell any health care providers that you have a port. Keep all follow-up visits as told by your health care provider. This is important. Contact a health care provider if: You cannot flush your port with saline as directed, or you cannot draw blood from the port. You have a fever or chills. You have redness, swelling, or pain around your port insertion site. You have fluid or blood coming from your port insertion site. Your port insertion site feels warm to the touch. You have pus or a bad smell coming from the port insertion site. Get help right away if: You have chest pain or shortness   of breath. You have bleeding from your port that you cannot control. Summary Take care of the port as told by your health care provider. Keep the manufacturer's information card with you at all times. Change your dressing as told by your health care provider. Contact a health care provider if you have a fever or chills or if you have redness, swelling, or pain around your port insertion site. Keep all follow-up visits as told by your health care provider. This information is not intended to replace advice given to you by your health care provider. Make sure you discuss any questions you have with your healthcare  provider.   Moderate Conscious Sedation, Adult, Care After This sheet gives you information about how to care for yourself after your procedure. Your health care provider may also give you more specific instructions. If you have problems or questions, contact your health careprovider. What can I expect after the procedure? After the procedure, it is common to have: Sleepiness for several hours. Impaired judgment for several hours. Difficulty with balance. Vomiting if you eat too soon. Follow these instructions at home: For the time period you were told by your health care provider: Rest. Do not participate in activities where you could fall or become injured. Do not drive or use machinery. Do not drink alcohol. Do not take sleeping pills or medicines that cause drowsiness. Do not make important decisions or sign legal documents. Do not take care of children on your own. Eating and drinking  Follow the diet recommended by your health care provider. Drink enough fluid to keep your urine pale yellow. If you vomit: Drink water, juice, or soup when you can drink without vomiting. Make sure you have little or no nausea before eating solid foods.  General instructions Take over-the-counter and prescription medicines only as told by your health care provider. Have a responsible adult stay with you for the time you are told. It is important to have someone help care for you until you are awake and alert. Do not smoke. Keep all follow-up visits as told by your health care provider. This is important. Contact a health care provider if: You are still sleepy or having trouble with balance after 24 hours. You feel light-headed. You keep feeling nauseous or you keep vomiting. You develop a rash. You have a fever. You have redness or swelling around the IV site. Get help right away if: You have trouble breathing. You have new-onset confusion at home. Summary After the procedure, it is  common to feel sleepy, have impaired judgment, or feel nauseous if you eat too soon. Rest after you get home. Know the things you should not do after the procedure. Follow the diet recommended by your health care provider and drink enough fluid to keep your urine pale yellow. Get help right away if you have trouble breathing or new-onset confusion at home. This information is not intended to replace advice given to you by your health care provider. Make sure you discuss any questions you have with your healthcare provider. Document Revised: 05/26/2019 Document Reviewed: 12/22/2018 Elsevier Patient Education  2022 Elsevier Inc. 

## 2021-09-24 ENCOUNTER — Encounter: Payer: Self-pay | Admitting: Hematology and Oncology

## 2021-09-24 ENCOUNTER — Inpatient Hospital Stay: Payer: Medicare Other

## 2021-09-24 ENCOUNTER — Encounter: Payer: Self-pay | Admitting: Radiology

## 2021-09-24 ENCOUNTER — Other Ambulatory Visit: Payer: Self-pay

## 2021-09-24 ENCOUNTER — Inpatient Hospital Stay: Payer: Medicare Other | Admitting: Licensed Clinical Social Worker

## 2021-09-24 VITALS — BP 133/78 | HR 71 | Temp 98.1°F | Resp 17 | Wt 219.5 lb

## 2021-09-24 DIAGNOSIS — Z5111 Encounter for antineoplastic chemotherapy: Secondary | ICD-10-CM | POA: Diagnosis not present

## 2021-09-24 DIAGNOSIS — C549 Malignant neoplasm of corpus uteri, unspecified: Secondary | ICD-10-CM

## 2021-09-24 MED ORDER — SODIUM CHLORIDE 0.9 % IV SOLN
150.0000 mg | Freq: Once | INTRAVENOUS | Status: AC
  Administered 2021-09-24: 150 mg via INTRAVENOUS
  Filled 2021-09-24: qty 150

## 2021-09-24 MED ORDER — SODIUM CHLORIDE 0.9 % IV SOLN
535.0000 mg | Freq: Once | INTRAVENOUS | Status: AC
  Administered 2021-09-24: 540 mg via INTRAVENOUS
  Filled 2021-09-24: qty 54

## 2021-09-24 MED ORDER — SODIUM CHLORIDE 0.9 % IV SOLN
10.0000 mg | Freq: Once | INTRAVENOUS | Status: AC
  Administered 2021-09-24: 10 mg via INTRAVENOUS
  Filled 2021-09-24: qty 10

## 2021-09-24 MED ORDER — SODIUM CHLORIDE 0.9 % IV SOLN: Freq: Once | INTRAVENOUS | Status: AC

## 2021-09-24 MED ORDER — FAMOTIDINE IN NACL 20-0.9 MG/50ML-% IV SOLN
20.0000 mg | Freq: Once | INTRAVENOUS | Status: AC
  Administered 2021-09-24: 20 mg via INTRAVENOUS

## 2021-09-24 MED ORDER — SODIUM CHLORIDE 0.9 % IV SOLN
500.0000 mg | Freq: Once | INTRAVENOUS | Status: AC
  Administered 2021-09-24: 500 mg via INTRAVENOUS
  Filled 2021-09-24: qty 10

## 2021-09-24 MED ORDER — CETIRIZINE HCL 10 MG/ML IV SOLN
10.0000 mg | Freq: Once | INTRAVENOUS | Status: AC
  Administered 2021-09-24: 10 mg via INTRAVENOUS
  Filled 2021-09-24: qty 1

## 2021-09-24 MED ORDER — HEPARIN SOD (PORK) LOCK FLUSH 100 UNIT/ML IV SOLN
500.0000 [IU] | Freq: Once | INTRAVENOUS | Status: AC | PRN
  Administered 2021-09-24: 500 [IU]

## 2021-09-24 MED ORDER — PALONOSETRON HCL INJECTION 0.25 MG/5ML
0.2500 mg | Freq: Once | INTRAVENOUS | Status: AC
  Administered 2021-09-24: 0.25 mg via INTRAVENOUS

## 2021-09-24 MED ORDER — SODIUM CHLORIDE 0.9 % IV SOLN
175.0000 mg/m2 | Freq: Once | INTRAVENOUS | Status: AC
  Administered 2021-09-24: 384 mg via INTRAVENOUS
  Filled 2021-09-24: qty 64

## 2021-09-24 MED ORDER — SODIUM CHLORIDE 0.9% FLUSH
10.0000 mL | INTRAVENOUS | Status: DC | PRN
  Administered 2021-09-24: 10 mL

## 2021-09-24 NOTE — Progress Notes (Addendum)
[facility] [place] [facility]  Initial Assessment   Sarah Wu is a 71 y.o. year old female accompanied by daughter, Sarah Wu. Clinical Social Work was referred by new patient protocol for assessment of psychosocial needs.   SDOH (Social Determinants of Health) assessments performed: Yes SDOH Interventions    Flowsheet Row Most Recent Value  SDOH Interventions   Food Insecurity Interventions Other (Comment)  [food pantry information]  Housing Interventions Intervention Not Indicated  Transportation Interventions Intervention Not Indicated       SDOH Screenings   Alcohol Screen: Not on file  Depression (TFT7-3): Not on file  Financial Resource Strain: Not on file  Food Insecurity: Food Insecurity Present (09/24/2021)   Hunger Vital Sign    Worried About Running Out of Food in the Last Year: Sometimes true    Ran Out of Food in the Last Year: Not on file  Housing: Low Risk  (09/24/2021)   Housing    Last Housing Risk Score: 0  Physical Activity: Not on file  Social Connections: Not on file  Stress: Not on file  Tobacco Use: Low Risk  (09/23/2021)   Patient History    Smoking Tobacco Use: Never    Smokeless Tobacco Use: Never    Passive Exposure: Never  Transportation Needs: No Transportation Needs (09/24/2021)   PRAPARE - Transportation    Lack of Transportation (Medical): No    Lack of Transportation (Non-Medical): No     Distress Screen completed: No     No data to display            Family/Social Information:  Housing Arrangement: patient lives with grandson Family members/support persons in your life? Family- 2 adult daughters, grandson Transportation concerns: no  Employment: Retired .  Income source: Paediatric nurse concerns:  potentially depending on costs of treatment Type of concern: Medical bills Food access concerns: hard to get out to grocery shop due to physical issues with legs Religious or spiritual practice: Not  known Services Currently in place:  [facility] Medicare, [employer], working with financial advocate  Coping/ Adjustment to diagnosis: Patient understands treatment plan and what happens next? yes Patient reported stressors: Food, Adjusting to my illness, and medical costs Current coping skills/ strengths: Supportive family/friends     SUMMARY: Current SDOH Barriers:  Limited ability to always get to grocery store due to physical concerns  Clinical Social Work Clinical Goal(s):  Explore community resource options for unmet needs related to:  Food Insecurity   Interventions: Discussed common feeling and emotions when being diagnosed with cancer, and the importance of support during treatment Informed patient of the support team roles and support services at [facility] Provided CSW contact information and encouraged patient to call with any questions or concerns Provided information on food pantries in [place] and local to pt (near [place])   Follow Up Plan: Patient will contact CSW with any support or resource needs Patient verbalizes understanding of plan: Yes    Dawnyel Leven E Akira Perusse, LCSW

## 2021-09-24 NOTE — Research (Signed)
A Randomized pragmatic Chair-Based Home Exercise Intervention for Mitigating Cancer-Related Fatigue in Older Adults Undergoing Chemotherapy for Advanced Disease  This Nurse has reviewed this patient's inclusion and exclusion criteria as a second review and confirms Sarah Wu is eligible for study participation.  Patient may continue with enrollment.  Foye Spurling, BSN, RN, [place] Nurse II 09/24/2021

## 2021-09-24 NOTE — Progress Notes (Signed)
Received call from patient's daughter with patient present regarding visit in treatment room today. Advised her I am working remote and she then realized it was [place] w/ social work.  Patient was given my card yesterday. Introduced myself as Arboriculturist and offered available resources.  Discussed one-time $1000 Radio broadcast assistant to assist with personal expenses while going through treatment. Advised what is needed to apply and she may present on 8/24 at check-in. She will then be provided the grant paperwork packet to complete. Advised to contact me at earliest convenience after appointment to further discuss grant details. She verbalized understanding.  She has my card for any additional financial questions or concerns.

## 2021-09-24 NOTE — Research (Addendum)
A Randomized pragmatic Chair-Based Home Exercise Intervention for Mitigating Cancer-Related Fatigue in Older Adults Undergoing Chemotherapy for Advanced Disease   09/24/2021  ELIGIBILITY:  This Coordinator has reviewed this patient's inclusion and exclusion criteria and confirmed Sarah Wu is eligible for study participation.  Patient will continue with enrollment.  Eligibility confirmed by research nurse and treating investigator, who also agrees that patient should proceed with enrollment.   This coordinator confirmed with patient: patient reports fatigue 7 out of 10 in the past week. Patient reports no exercise in the past 6 months.  CONSENT:[name] was identified by Dr. Alvy Bimler as a potential candidate for the above listed study.  This Clinical Research Coordinator met with Nasiyah Laverdiere, [id number], on 09/24/21 in a manner and location that ensures patient privacy to discuss participation in the above listed research study.  Patient is Accompanied by her daughter .  A copy of the informed consent document and separate HIPAA Authorization was provided to the patient.  Patient reads, speaks, and understands [place].    Patient was provided with the business card of this Coordinator and encouraged to contact the research team with any questions.  Patient was provided the option of taking informed consent documents home to review and was encouraged to review at their convenience with their support network, including other care providers. Patient is comfortable with making a decision regarding study participation today. Patient and family were previously provided consent documents via e-mail.   As outlined in the informed consent form, this Coordinator and Shawntavia Saunders discussed the purpose of the research study, the investigational nature of the study, study procedures and requirements for study participation, potential risks and benefits of study  participation, as well as alternatives to participation. This study is not blinded. The patient understands participation is voluntary and they may withdraw from study participation at any time.  Each study arm was reviewed, and randomization discussed.  This study does not involve an investigational drug or device. This study does not involve a placebo. Patient understands enrollment is pending full eligibility review.   Confidentiality and how the patient's information will be used as part of study participation were discussed.  Patient was informed there is reimbursement provided for their time and effort spent on trial participation.  The patient is encouraged to discuss research study participation with their insurance provider to determine what costs they may incur as part of study participation, including research related injury.    All questions were answered to patient's satisfaction.  The informed consent and separate HIPAA Authorization was reviewed page by page.  The patient's mental and emotional status is appropriate to provide informed consent, and the patient verbalizes an understanding of study participation.  Patient has agreed to participate in the above listed research study and has voluntarily signed the informed consent dated 04/17/20 with valid date until 01/15/22 versionand separate HIPAA Authorization, version dated valid until 01/15/22  on 09/24/21 at 840AM.  The patient was provided with a copy of the signed informed consent form and separate HIPAA Authorization for their reference.  No study specific procedures were obtained prior to the signing of the informed consent document.  Approximately 25 minutes were spent with the patient reviewing the informed consent documents.  Patient was not requested to complete a Release of Information form.    MD agrees patient agrees patients expected life expectancy is greater than or equal to 6 months. Physician deems patient safe/appropriate to  participate in an exercise regimen.   Patient and  her daughter were thanked for their time and support of the above mentioned study.   PATIENT REGISTRATION: After patient completed baseline questionnaires, patient was registered and randomized. Patient was randomized to Chair Exercise arm. Patient and family were informed of randomization group.Due to chemo schedule and distance patient lives, we will complete SPPB and chair exercise education at her next visit on Thursday 8/24. PID [id number].   Candace Smith, RT(R)(T) Clinical Research Coordinator  

## 2021-09-24 NOTE — Patient Instructions (Signed)
[facility]  Discharge Instructions: Thank you for choosing [facility] to provide your oncology and hematology care.   If you have a lab appointment with the [facility], please go directly to the [facility] and check in at the registration area.   Wear comfortable clothing and clothing appropriate for easy access to any Portacath or PICC line.   We strive to give you quality time with your provider. You may need to reschedule your appointment if you arrive late (15 or more minutes).  Arriving late affects you and other patients whose appointments are after yours.  Also, if you miss three or more appointments without notifying the office, you may be dismissed from the clinic at the provider's discretion.      For prescription refill requests, have your pharmacy contact our office and allow 72 hours for refills to be completed.    Today you received the following chemotherapy and/or immunotherapy agents: Paclitaxel, Carboplatin, Jemperli      To help prevent nausea and vomiting after your treatment, we encourage you to take your nausea medication as directed.  BELOW ARE SYMPTOMS THAT SHOULD BE REPORTED IMMEDIATELY: *FEVER GREATER THAN 100.4 F (38 C) OR HIGHER *CHILLS OR SWEATING *NAUSEA AND VOMITING THAT IS NOT CONTROLLED WITH YOUR NAUSEA MEDICATION *UNUSUAL SHORTNESS OF BREATH *UNUSUAL BRUISING OR BLEEDING *URINARY PROBLEMS (pain or burning when urinating, or frequent urination) *BOWEL PROBLEMS (unusual diarrhea, constipation, pain near the anus) TENDERNESS IN MOUTH AND THROAT WITH OR WITHOUT PRESENCE OF ULCERS (sore throat, sores in mouth, or a toothache) UNUSUAL RASH, SWELLING OR PAIN  UNUSUAL VAGINAL DISCHARGE OR ITCHING   Items with * indicate a potential emergency and should be followed up as soon as possible or go to the Emergency Department if any problems should occur.  Please show the CHEMOTHERAPY ALERT CARD or IMMUNOTHERAPY  ALERT CARD at check-in to the Emergency Department and triage nurse.  Should you have questions after your visit or need to cancel or reschedule your appointment, please contact [facility]  Dept: [phone]  and follow the prompts.  Office hours are 8:00 a.m. to 4:30 p.m. Monday - Friday. Please note that voicemails left after 4:00 p.m. may not be returned until the following business day.  We are closed weekends and major holidays. You have access to a nurse at all times for urgent questions. Please call the main number to the clinic Dept: [phone] and follow the prompts.   For any non-urgent questions, you may also contact your provider using MyChart. We now offer e-Visits for anyone 72 and older to request care online for non-urgent symptoms. For details visit [email or web address].   Also download the MyChart app! Go to the app store, search "MyChart", open the app, select [facility], and log in with your MyChart username and password.  Masks are optional in the cancer centers. If you would like for your care team to wear a mask while they are taking care of you, please let them know. You may have one support person who is at least 72 years old accompany you for your appointments.  Paclitaxel Injection What is this medication? PACLITAXEL (PAK li TAX el) treats some types of cancer. It works by slowing down the growth of cancer cells. This medicine may be used for other purposes; ask your health care provider or pharmacist if you have questions. COMMON BRAND NAME(S): Onxol, Taxol What should I tell my care team before I  take this medication? They need to know if you have any of these conditions: Heart disease Liver disease Low white blood cell levels An unusual or allergic reaction to paclitaxel, other medications, foods, dyes, or preservatives If you or your partner are pregnant or trying to get pregnant Breast-feeding How should I use this  medication? This medication is injected into a vein. It is given by your care team in a hospital or clinic setting. Talk to your care team about the use of this medication in children. While it may be given to children for selected conditions, precautions do apply. Overdosage: If you think you have taken too much of this medicine contact a poison control center or emergency room at once. NOTE: This medicine is only for you. Do not share this medicine with others. What if I miss a dose? Keep appointments for follow-up doses. It is important not to miss your dose. Call your care team if you are unable to keep an appointment. What may interact with this medication? Do not take this medication with any of the following: Live virus vaccines Other medications may affect the way this medication works. Talk with your care team about all of the medications you take. They may suggest changes to your treatment plan to lower the risk of side effects and to make sure your medications work as intended. This list may not describe all possible interactions. Give your health care provider a list of all the medicines, herbs, non-prescription drugs, or dietary supplements you use. Also tell them if you smoke, drink alcohol, or use illegal drugs. Some items may interact with your medicine. What should I watch for while using this medication? Your condition will be monitored carefully while you are receiving this medication. You may need blood work while taking this medication. This medication may make you feel generally unwell. This is not uncommon as chemotherapy can affect healthy cells as well as cancer cells. Report any side effects. Continue your course of treatment even though you feel ill unless your care team tells you to stop. This medication can cause serious allergic reactions. To reduce the risk, your care team may give you other medications to take before receiving this one. Be sure to follow the directions  from your care team. This medication may increase your risk of getting an infection. Call your care team for advice if you get a fever, chills, sore throat, or other symptoms of a cold or flu. Do not treat yourself. Try to avoid being around people who are sick. This medication may increase your risk to bruise or bleed. Call your care team if you notice any unusual bleeding. Be careful brushing or flossing your teeth or using a toothpick because you may get an infection or bleed more easily. If you have any dental work done, tell your dentist you are receiving this medication. Talk to your care team if you may be pregnant. Serious birth defects can occur if you take this medication during pregnancy. Talk to your care team before breastfeeding. Changes to your treatment plan may be needed. What side effects may I notice from receiving this medication? Side effects that you should report to your care team as soon as possible: Allergic reactions--skin rash, itching, hives, swelling of the face, lips, tongue, or throat Heart rhythm changes--fast or irregular heartbeat, dizziness, feeling faint or lightheaded, chest pain, trouble breathing Increase in blood pressure Infection--fever, chills, cough, sore throat, wounds that don't heal, pain or trouble when passing urine, general   feeling of discomfort or being unwell Low blood pressure--dizziness, feeling faint or lightheaded, blurry vision Low red blood cell level--unusual weakness or fatigue, dizziness, headache, trouble breathing Painful swelling, warmth, or redness of the skin, blisters or sores at the infusion site Pain, tingling, or numbness in the hands or feet Slow heartbeat--dizziness, feeling faint or lightheaded, confusion, trouble breathing, unusual weakness or fatigue Unusual bruising or bleeding Side effects that usually do not require medical attention (report to your care team if they continue or are bothersome): Diarrhea Hair  loss Joint pain Loss of appetite Muscle pain Nausea Vomiting This list may not describe all possible side effects. Call your doctor for medical advice about side effects. You may report side effects to FDA at 1-800-FDA-1088. Where should I keep my medication? This medication is given in a hospital or clinic. It will not be stored at home. NOTE: This sheet is a summary. It may not cover all possible information. If you have questions about this medicine, talk to your doctor, pharmacist, or health care provider.  2023 Elsevier/Gold Standard (2021-06-12 00:00:00)  Carboplatin Injection What is this medication? CARBOPLATIN (KAR boe pla tin) treats some types of cancer. It works by slowing down the growth of cancer cells. This medicine may be used for other purposes; ask your health care provider or pharmacist if you have questions. COMMON BRAND NAME(S): Paraplatin What should I tell my care team before I take this medication? They need to know if you have any of these conditions: Blood disorders Hearing problems Kidney disease Recent or ongoing radiation therapy An unusual or allergic reaction to carboplatin, cisplatin, other medications, foods, dyes, or preservatives Pregnant or trying to get pregnant Breast-feeding How should I use this medication? This medication is injected into a vein. It is given by your care team in a hospital or clinic setting. Talk to your care team about the use of this medication in children. Special care may be needed. Overdosage: If you think you have taken too much of this medicine contact a poison control center or emergency room at once. NOTE: This medicine is only for you. Do not share this medicine with others. What if I miss a dose? Keep appointments for follow-up doses. It is important not to miss your dose. Call your care team if you are unable to keep an appointment. What may interact with this medication? Medications for seizures Some  antibiotics, such as amikacin, gentamicin, neomycin, streptomycin, tobramycin Vaccines This list may not describe all possible interactions. Give your health care provider a list of all the medicines, herbs, non-prescription drugs, or dietary supplements you use. Also tell them if you smoke, drink alcohol, or use illegal drugs. Some items may interact with your medicine. What should I watch for while using this medication? Your condition will be monitored carefully while you are receiving this medication. You may need blood work while taking this medication. This medication may make you feel generally unwell. This is not uncommon, as chemotherapy can affect healthy cells as well as cancer cells. Report any side effects. Continue your course of treatment even though you feel ill unless your care team tells you to stop. In some cases, you may be given additional medications to help with side effects. Follow all directions for their use. This medication may increase your risk of getting an infection. Call your care team for advice if you get a fever, chills, sore throat, or other symptoms of a cold or flu. Do not treat yourself. Try to  avoid being around people who are sick. Avoid taking medications that contain aspirin, acetaminophen, ibuprofen, naproxen, or ketoprofen unless instructed by your care team. These medications may hide a fever. Be careful brushing or flossing your teeth or using a toothpick because you may get an infection or bleed more easily. If you have any dental work done, tell your dentist you are receiving this medication. Talk to your care team if you wish to become pregnant or think you might be pregnant. This medication can cause serious birth defects. Talk to your care team about effective forms of contraception. Do not breast-feed while taking this medication. What side effects may I notice from receiving this medication? Side effects that you should report to your care team as  soon as possible: Allergic reactions--skin rash, itching, hives, swelling of the face, lips, tongue, or throat Infection--fever, chills, cough, sore throat, wounds that don't heal, pain or trouble when passing urine, general feeling of discomfort or being unwell Low red blood cell level--unusual weakness or fatigue, dizziness, headache, trouble breathing Pain, tingling, or numbness in the hands or feet, muscle weakness, change in vision, confusion or trouble speaking, loss of balance or coordination, trouble walking, seizures Unusual bruising or bleeding Side effects that usually do not require medical attention (report to your care team if they continue or are bothersome): Hair loss Nausea Unusual weakness or fatigue Vomiting This list may not describe all possible side effects. Call your doctor for medical advice about side effects. You may report side effects to FDA at 1-800-FDA-1088. Where should I keep my medication? This medication is given in a hospital or clinic. It will not be stored at home. NOTE: This sheet is a summary. It may not cover all possible information. If you have questions about this medicine, talk to your doctor, pharmacist, or health care provider.  2023 Elsevier/Gold Standard (2021-05-20 00:00:00)  Dostarlimab Injection What is this medication? DOSTARLIMAB (dos tar li mab) treats some types of cancer. It works by helping your immune system slow or stop the spread of cancer cells. It is a monoclonal antibody. This medicine may be used for other purposes; ask your health care provider or pharmacist if you have questions. COMMON BRAND NAME(S): Jemperli What should I tell my care team before I take this medication? They need to know if you have any of these conditions: Allogeneic stem cell transplant (uses someone else's stem cells) Autoimmune diseases, such as Crohn disease, ulcerative colitis, lupus History of chest radiation Nervous system problems, such as  Guillain-Barre syndrome, myasthenia gravis Organ transplant An unusual or allergic reaction to dostarlimab, other medications, foods, dyes, or preservatives Pregnant or trying to get pregnant Breast-feeding How should I use this medication? This medication is injected into a vein. It is given by your care team in a hospital or clinic setting. A special MedGuide will be given to you before each treatment. Be sure to read this information carefully each time. Talk to your care team about the use of this medication in children. Special care may be needed. Overdosage: If you think you have taken too much of this medicine contact a poison control center or emergency room at once. NOTE: This medicine is only for you. Do not share this medicine with others. What if I miss a dose? Keep appointments for follow-up doses. It is important not to miss your dose. Call your care team if you are unable to keep an appointment. What may interact with this medication? Interactions have not been studied.  This list may not describe all possible interactions. Give your health care provider a list of all the medicines, herbs, non-prescription drugs, or dietary supplements you use. Also tell them if you smoke, drink alcohol, or use illegal drugs. Some items may interact with your medicine. What should I watch for while using this medication? Your condition will be monitored carefully while you are receiving this medication. You may need blood work while taking this medication. This medication may cause serious skin reactions. They can happen weeks to months after starting the medication. Contact your care team right away if you notice fevers or flu-like symptoms with a rash. The rash may be red or purple and then turn into blisters or peeling of the skin. You may also notice a red rash with swelling of the face, lips, or lymph nodes in your neck or under your arms. Tell your care team right away if you have any change in  your eyesight. Talk to your care team if you may be pregnant. Serious birth defects can occur if you take this medication during pregnancy and for 4 months after the last dose. You will need a negative pregnancy test before starting this medication. Contraception is recommended while taking this medication and for 4 months after the last dose. Your care team can help you find the option that works for you. Do not breastfeed while taking this medication and for 4 months after the last dose. What side effects may I notice from receiving this medication? Side effects that you should report to your care team as soon as possible: Allergic reactions--skin rash, itching, hives, swelling of the face, lips, tongue, or throat Dry cough, shortness of breath or trouble breathing Eye pain, redness, irritation, or discharge with blurry or decreased vision Heart muscle inflammation--unusual weakness or fatigue, shortness of breath, chest pain, fast or irregular heartbeat, dizziness, swelling of the ankles, feet, or hands Hormone gland problems--headache, sensitivity to light, unusual weakness or fatigue, dizziness, fast or irregular heartbeat, increased sensitivity to cold or heat, excessive sweating, constipation, hair loss, increased thirst or amount of urine, tremors or shaking, irritability Infusion reactions--chest pain, shortness of breath or trouble breathing, feeling faint or lightheaded Kidney injury (glomerulonephritis)--decrease in the amount of urine, red or dark brown urine, foamy or bubbly urine, swelling of the ankles, hands, or feet Liver injury--right upper belly pain, loss of appetite, nausea, light-colored stool, dark yellow or brown urine, yellowing skin or eyes, unusual weakness or fatigue Pain, tingling, or numbness in the hands or feet, muscle weakness, change in vision, confusion or trouble speaking, loss of balance or coordination, trouble walking, seizures Rash, fever, and swollen lymph  nodes Redness, blistering, peeling, or loosening of the skin, including inside the mouth Sudden or severe stomach pain, bloody diarrhea, fever, nausea, vomiting Side effects that usually do not require medical attention (report these to your care team if they continue or are bothersome): Bone, joint, or muscle pain Diarrhea Fatigue Loss of appetite Nausea Skin rash This list may not describe all possible side effects. Call your doctor for medical advice about side effects. You may report side effects to FDA at 1-800-FDA-1088. Where should I keep my medication? This medication is given in a hospital or clinic. It will not be stored at home. NOTE: This sheet is a summary. It may not cover all possible information. If you have questions about this medicine, talk to your doctor, pharmacist, or health care provider.  2023 Elsevier/Gold Standard (2021-06-10 00:00:00)

## 2021-09-25 ENCOUNTER — Telehealth: Payer: Self-pay

## 2021-09-25 NOTE — Telephone Encounter (Signed)
Attempted to call Ms Nakama. No answer and voicemail is full.

## 2021-09-25 NOTE — Telephone Encounter (Signed)
-----   Message from Charleston Poot, RN sent at 09/24/2021  2:47 PM EDT ----- Regarding: First Time/ paclitaxel, carboplatin, dostarlimab/ Dr Alvy Bimler pt Hello,  Pt had first time dostarlimab, paclitaxel, and carboplatin today. Tolerated treatment well.  Thanks, Libbie K.

## 2021-09-25 NOTE — Telephone Encounter (Signed)
Sarah Wu states that she is doing fine. She is eating, drinking, and urinating well. She has the office number [phone] if she has any questions or concerns

## 2021-09-26 ENCOUNTER — Other Ambulatory Visit (HOSPITAL_COMMUNITY): Payer: Self-pay

## 2021-09-26 ENCOUNTER — Telehealth: Payer: Self-pay

## 2021-09-26 ENCOUNTER — Other Ambulatory Visit: Payer: Self-pay | Admitting: Hematology and Oncology

## 2021-09-26 DIAGNOSIS — C801 Malignant (primary) neoplasm, unspecified: Secondary | ICD-10-CM

## 2021-09-26 DIAGNOSIS — C549 Malignant neoplasm of corpus uteri, unspecified: Secondary | ICD-10-CM

## 2021-09-26 MED ORDER — TRAMADOL HCL 50 MG PO TABS
100.0000 mg | ORAL_TABLET | Freq: Four times a day (QID) | ORAL | 0 refills | Status: DC | PRN
  Filled 2021-09-26: qty 60, 8d supply, fill #0

## 2021-09-26 MED ORDER — TRAMADOL HCL 50 MG PO TABS: 100.0000 mg | ORAL_TABLET | Freq: Four times a day (QID) | ORAL | 0 refills | Status: DC | PRN

## 2021-09-26 NOTE — Telephone Encounter (Signed)
She can take tramadol with tylenol I increased the presription to take 2 as needed and sent it to [place] her daughter to watch out for constipation

## 2021-09-26 NOTE — Telephone Encounter (Addendum)
Returned call to daughter. Her Mom started c/o of back and leg pain during the night. She took tylenol and did not get relief. She took a Tramadol and went to sleep. When she woke up she is complaining pain at a 7. Instructed to take Tramadol as needed, she has 8 tablets. Alternate with tylenol every 6 hours as needed.  Daughter verbalized understanding and will call the office back for questions or worsening symptoms. She does not think that she needs a tramadol Rx at this time.  FYI

## 2021-09-26 NOTE — Telephone Encounter (Signed)
Done No guarantee they will have it

## 2021-09-26 NOTE — Telephone Encounter (Signed)
Called daughter and given below message. She appreciated the call and verbalized understanding.  Daughter ask if you could send to [facility] in [place]? Canceled Rx at [facility].

## 2021-09-29 ENCOUNTER — Other Ambulatory Visit: Payer: Self-pay | Admitting: *Deleted

## 2021-09-29 DIAGNOSIS — C549 Malignant neoplasm of corpus uteri, unspecified: Secondary | ICD-10-CM | POA: Insufficient documentation

## 2021-09-29 NOTE — Progress Notes (Signed)
The proposed treatment discussed in conference is for discussion purpose only and is not a binding recommendation.  The patients have not been physically examined, or presented with their treatment options.  Therefore, final treatment plans cannot be decided.  

## 2021-10-02 ENCOUNTER — Inpatient Hospital Stay (HOSPITAL_BASED_OUTPATIENT_CLINIC_OR_DEPARTMENT_OTHER): Payer: Medicare Other | Admitting: Gynecologic Oncology

## 2021-10-02 ENCOUNTER — Inpatient Hospital Stay: Payer: Medicare Other | Admitting: Radiology

## 2021-10-02 ENCOUNTER — Inpatient Hospital Stay: Payer: Medicare Other

## 2021-10-02 ENCOUNTER — Other Ambulatory Visit: Payer: Self-pay

## 2021-10-02 ENCOUNTER — Telehealth: Payer: Self-pay | Admitting: *Deleted

## 2021-10-02 ENCOUNTER — Encounter: Payer: Self-pay | Admitting: Gynecologic Oncology

## 2021-10-02 ENCOUNTER — Inpatient Hospital Stay (HOSPITAL_BASED_OUTPATIENT_CLINIC_OR_DEPARTMENT_OTHER): Payer: Medicare Other | Admitting: Hematology and Oncology

## 2021-10-02 ENCOUNTER — Encounter: Payer: Self-pay | Admitting: Hematology and Oncology

## 2021-10-02 VITALS — BP 122/72 | HR 79 | Temp 98.5°F | Resp 18 | Ht 68.0 in | Wt 215.0 lb

## 2021-10-02 DIAGNOSIS — K5909 Other constipation: Secondary | ICD-10-CM

## 2021-10-02 DIAGNOSIS — Z5111 Encounter for antineoplastic chemotherapy: Secondary | ICD-10-CM | POA: Diagnosis not present

## 2021-10-02 DIAGNOSIS — R638 Other symptoms and signs concerning food and fluid intake: Secondary | ICD-10-CM

## 2021-10-02 DIAGNOSIS — Z7189 Other specified counseling: Secondary | ICD-10-CM

## 2021-10-02 DIAGNOSIS — C55 Malignant neoplasm of uterus, part unspecified: Secondary | ICD-10-CM

## 2021-10-02 DIAGNOSIS — C549 Malignant neoplasm of corpus uteri, unspecified: Secondary | ICD-10-CM

## 2021-10-02 DIAGNOSIS — D61818 Other pancytopenia: Secondary | ICD-10-CM

## 2021-10-02 DIAGNOSIS — I10 Essential (primary) hypertension: Secondary | ICD-10-CM

## 2021-10-02 DIAGNOSIS — C801 Malignant (primary) neoplasm, unspecified: Secondary | ICD-10-CM

## 2021-10-02 LAB — CMP (CANCER CENTER ONLY)
ALT: 15 U/L (ref 0–44)
AST: 20 U/L (ref 15–41)
Albumin: 3.7 g/dL (ref 3.5–5.0)
Alkaline Phosphatase: 48 U/L (ref 38–126)
Anion gap: 7 (ref 5–15)
BUN: 9 mg/dL (ref 8–23)
CO2: 29 mmol/L (ref 22–32)
Calcium: 9.3 mg/dL (ref 8.9–10.3)
Chloride: 100 mmol/L (ref 98–111)
Creatinine: 0.56 mg/dL (ref 0.44–1.00)
GFR, Estimated: >60 mL/min (ref >60)
Glucose, Bld: 105 mg/dL — ABNORMAL HIGH (ref 70–99)
Potassium: 3.8 mmol/L (ref 3.5–5.1)
Sodium: 136 mmol/L (ref 135–145)
Total Bilirubin: 0.8 mg/dL (ref 0.3–1.2)
Total Protein: 6.8 g/dL (ref 6.5–8.1)

## 2021-10-02 LAB — CBC WITH DIFFERENTIAL (CANCER CENTER ONLY)
Abs Immature Granulocytes: 0.02 10*3/uL (ref 0.00–0.07)
Basophils Absolute: 0 10*3/uL (ref 0.0–0.1)
Basophils Relative: 0 %
Eosinophils Absolute: 0.1 10*3/uL (ref 0.0–0.5)
Eosinophils Relative: 7 %
HCT: 25.8 % — ABNORMAL LOW (ref 36.0–46.0)
Hemoglobin: 8.1 g/dL — ABNORMAL LOW (ref 12.0–15.0)
Immature Granulocytes: 2 %
Lymphocytes Relative: 57 %
Lymphs Abs: 0.6 10*3/uL — ABNORMAL LOW (ref 0.7–4.0)
MCH: 24.9 pg — ABNORMAL LOW (ref 26.0–34.0)
MCHC: 31.4 g/dL (ref 30.0–36.0)
MCV: 79.4 fL — ABNORMAL LOW (ref 80.0–100.0)
Monocytes Absolute: 0.2 10*3/uL (ref 0.1–1.0)
Monocytes Relative: 17 %
Neutro Abs: 0.2 10*3/uL — CL (ref 1.7–7.7)
Neutrophils Relative %: 17 %
Platelet Count: 170 10*3/uL (ref 150–400)
RBC: 3.25 MIL/uL — ABNORMAL LOW (ref 3.87–5.11)
RDW: 14.9 % (ref 11.5–15.5)
WBC Count: 1.1 10*3/uL — ABNORMAL LOW (ref 4.0–10.5)
nRBC: 0 % (ref 0.0–0.2)

## 2021-10-02 LAB — TSH: TSH: 0.883 u[IU]/mL (ref 0.350–4.500)

## 2021-10-02 MED ORDER — SODIUM CHLORIDE 0.9% FLUSH
10.0000 mL | Freq: Once | INTRAVENOUS | Status: AC
  Administered 2021-10-02: 10 mL

## 2021-10-02 MED ORDER — SENNOSIDES-DOCUSATE SODIUM 8.6-50 MG PO TABS: 2.0000 | ORAL_TABLET | Freq: Two times a day (BID) | ORAL | 0 refills | Status: DC

## 2021-10-02 NOTE — Assessment & Plan Note (Signed)
I recommend aggressive laxative therapy

## 2021-10-02 NOTE — Assessment & Plan Note (Signed)
Clinically, she is still having some persistent vaginal bleeding but not severe Continue supportive care She will return next week for repeat CBC count and transfusion as needed I will plan to repeat imaging study after 3 cycles of therapy

## 2021-10-02 NOTE — Telephone Encounter (Signed)
Connected with Annamarie Major post research visit in blue sub-waiting area.  Obtained signed [place].  Completed daughter's FMLA paperwork today.  Folder currently placed for collaborative pick up in bin designated for patient assigned provider.  Awaiting return to this nurse upon provider review and signature.

## 2021-10-02 NOTE — Progress Notes (Signed)
CRITICAL VALUE STICKER  CRITICAL VALUE:  ANC 0.2  RECEIVER (on-site recipient of call): Harrel Lemon, RN  DATE & TIME NOTIFIED: 10/02/21 AT 1327  MESSENGER (representative from lab): Hillary Flynt.  MD NOTIFIED:  Dr. Alvy Bimler  TIME OF NOTIFICATION: 1330 10/02/21  RESPONSE: Will review.

## 2021-10-02 NOTE — Assessment & Plan Note (Signed)
She has reduced oral intake I gave her daughter more flow sheet to document her oral intake I recommend the patient emphasizing on good protein intake and increase oral fluid as tolerated

## 2021-10-02 NOTE — Assessment & Plan Note (Signed)
Her blood pressure is normal I emphasized importance of hydration

## 2021-10-02 NOTE — Progress Notes (Signed)
Gynecologic Oncology Return Clinic Visit  10/02/2021  Reason for Visit: Follow-up after surgery, treatment planning  Treatment History: Oncology History Overview Note  Outside path showed carcinosarcoma, PD-L1 positive, MSI stable   Uterine cancer ([place])  07/30/2021 Initial Diagnosis   She was evaluated for post-menopausal bleeding. She reported having been seen a year previously and having a biopsy that was negative.  On exam in clinic, she was noted to have a mass present within the vagina.  This was thought to be a prolapsing fibroid and plan was made for exam under anesthesia, removal prolapsing fibroid with hysteroscopy and D&C   08/11/2021 Pathology Results   PD-L1 [phone]):                                             Does meet FDA-approval, Combined Positive Score (CPS): >=1   Pan-TRK IHC:                                              Tumor cells are Focally Positive for TRK protein expression.   MICROSATELLITE INSTABILITY:             MS-Stable   TUMOR MUTATION BURDEN:                10.8 Muts/Mb TMB-Low   OTHER BIOMARKERS:                              MET amplification PIK3CA amplification MDM2 amplification CCNE1 amplification   Pertinent Negative Biomarkers Evaluated by NGS: ALK ATM BARD1 BRAF BRCA1 BRCA2 CDK12 CHECK1 CHEK2 EGFR  ERBB2 ESR1 FANCL FGFR2 FGFR3 IDH1 IDH2 KIT KRAS NRAS NTRK1 NTRK2 NTRK3 PALB2 PDGFRA RAD51B RAD51C  RAD51D RAD54L RET ROS1   Comment:  The above studies are performed and reported by [facility] [place]) as part of the [facility] (NGS) panel.  Pertinent results are summarized above.  Please see their separate reports for more detailed information  Addendum electronically signed by Elsworth Soho, MD on 09/03/2021 at  7:37 AM  Final Diagnosis    A.  CERVICAL MASS, EXCISION:       - CONSISTENT WITH CARCINOSARCOMA (HIGH GRADE MALIGNANT BIPHASIC TUMOR)  Electronically signed by Elsworth Soho, MD on 08/15/2021 at  9:53 AM  Comment    The malignant  epithelial component exhibits high-grade cytomorphology and immunostaining pattern consistent with serous carcinoma to include strong positive diffuse reactivity for p53, p16, and vimentin, focal subset CK7 and CK AE1/AE3 expression, with high Ki-67 staining (approximately 60%).  Neuroendocrine differentiation is present (CD56 negative but synaptophysin positive).  The stromal component is composed of malignant mitotically active cells and heterologous elements with focal chondroid differentiation (S100 positive).  SMA appears to decorate rare tumor cells and highlights vascular structures.  The tumor cells are non-reactive for CK20, WT1, GATA3, p40 and desmin.  Block A5 is referred to [facility], [place]) to perform molecular characterization.  These findings are shared with Dr. Luanne Bras on [phone] at [phone] hours by telephone conversation. This case received prospective intradepartmental quality assurance review.   Clinical Information    Cervical mass  Gross Description    A. Cervix Received fresh labeled with the patient's name and  date of birth and "cervical mass frozen section" per container is an aggregate of red-tan friable soft tissue measuring approximately 5 x 5 x 3 cm in toto.  Representative sections are frozen for an intraoperative consultation.  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   Results rendered by Dr. Wyatt Portela and reported to Dr. Macarthur Critchley at [facility] on 08/11/2021 at 8:06 AM.  Microscopic Description    Microscopic examination supports the above diagnosis.  Single antibody immunostains are performed to include: S100, p40, p16, p53, PAX8, CK7, CK20, vimentin, CK AE1/3, GATA3, WT1, desmin, SMA, synaptophysin, CD56, and Ki-67.  All controls  are appropriately reactive. [id number], [id number], [id number], [id number], [id number], [id number] (15)          08/22/2021 Initial Diagnosis   Carcinosarcoma ([place])   09/01/2021 Imaging   CT abdomen and pelvis There is marked enlargement of the uterus measuring 13.7 x 10 x 8 x 10.1 cm. There is inhomogeneous enhancement in the uterus consistent with malignant neoplasm.   There are enlarged lymph nodes in the retroperitoneum in the para-and paracaval regions measuring up to 12 mm in short axis. There are a few enlarged lymph nodes adjacent to the iliac vessels largest measuring 2.6 x 1.4 cm adjacent to the right external iliac vessels. Findings suggest possible metastatic lymphadenopathy. There is mild prominence of the pelvocaliceal system in the left kidney and proximal left ureter. Possibility of extrinsic compression or infiltrative process related to the uterine neoplasm in the distal course of left ureter causing mild obstruction is not excluded.   Gallbladder stone.  Diverticulosis of colon.   Other findings as described in the body of the report.   09/01/2021 Imaging   US pelvis Large central uterine ill-defined mass approximately 7.7 cm diameter enlarging uterus likely representing the biopsy-proven neoplasm, likely of endometrial origin.   Questionable intramural leiomyoma posterior mid uterus 3.1 cm diameter.   The mass extend and the adnexal regions are poorly characterized but may potentially be better evaluated by MR imaging with and without contrast.   09/10/2021 Surgery   Pre-operative Diagnosis: carcinosarcoma of the uterus, adenopathy on imaging   Post-operative Diagnosis: same, stage IVB carcinosarcoma of the uterus   Operation: Diagnostic laparoscopy    Surgeon: Jeral Pinch MD Operative Findings: On EUA, cervix 2 cm dilated with tumor noted within the os. Cervix otherwise normal appearing. Uterus enlarged, 12 cm, with limited mobility secondary to weight. Compression of the rectum from uterus  although no direct invasion appreciated. On intra-abdominal inspection, adhesions noted between the liver and the anterior abdominal wall. Evidence of carcinomatosis involving liver surface, diaphragm, omentum, anterior abdominal wall. Surface of the uterus with obvious tumor infiltration. Small bowel free from uterus. Posteriorly, sigmoid densely adherent to the posterior uterus. Even with manipulator, unable to move the uterus anteriorly. Minimal ascites.    Given findings of stage IV disease and what would require large bowel resection and end ostomy, decision made to abort surgery in favor of neoadjuvant chemotherapy. I had discussed this as a distinct possibility when we had gotten her CT results as well as this morning prior to the surgery.    09/15/2021 Cancer Staging   Staging form: Corpus Uteri - Carcinoma and Carcinosarcoma, AJCC 8th Edition - Clinical stage from 09/15/2021: FIGO Stage IVB (cT3, cN2a, pM1) -  Signed by Heath Lark, MD on 09/15/2021 Stage prefix: Initial diagnosis   09/24/2021 -  Chemotherapy   Patient is on Treatment Plan : UTERINE ENDOMETRIAL Dostarlimab-gxly (500 mg) + Carboplatin (AUC 5) + Paclitaxel (175 mg/m2) q21d x 6 cycles / Dostarlimab-gxly (1000 mg) q42d x 6 cycles        Interval History: Patient is doing well.  Denies any pain related to her incisions.  Is continuing to struggle with some constipation, some improvement with starting medications.  Working on increasing p.o. intake, specifically protein.  Denies any urinary symptoms.  Continues to have some vaginal bleeding.  Past Medical/Surgical History: Past Medical History:  Diagnosis Date   Arthritis    Fibroid tumor    Hx of mammogram 2022   [place]   Hypercholesteremia    Hypertension    Post-menopausal bleeding    Pre-diabetes     Past Surgical History:  Procedure Laterality Date   EXAMINATION UNDER ANESTHESIA  08/11/2021   with cervical mass biopsies   IR IMAGING GUIDED PORT INSERTION   09/23/2021   LAPAROSCOPY N/A 09/10/2021   Procedure: DIAGNOSTIC LAPAROSCOPY;  Surgeon: Lafonda Mosses, MD;  Location: [facility];  Service: Gynecology;  Laterality: N/A;   REPLACEMENT TOTAL KNEE BILATERAL     TUBAL LIGATION      Family History  Problem Relation Age of Onset   Breast cancer Sister        half-sister    Social History   Socioeconomic History   Marital status: Single    Spouse name: Not on file   Number of children: Not on file   Years of education: Not on file   Highest education level: Not on file  Occupational History   Not on file  Tobacco Use   Smoking status: Never    Passive exposure: Never   Smokeless tobacco: Never  Vaping Use   Vaping Use: Never used  Substance and Sexual Activity   Alcohol use: Never   Drug use: Never   Sexual activity: Not Currently  Other Topics Concern   Not on file  Social History Narrative   Not on file   Social Determinants of Health   Financial Resource Strain: Not on file  Food Insecurity: Food Insecurity Present (09/24/2021)   Hunger Vital Sign    Worried About Running Out of Food in the Last Year: Sometimes true    Ran Out of Food in the Last Year: Not on file  Transportation Needs: No Transportation Needs (09/24/2021)   PRAPARE - Hydrologist (Medical): No    Lack of Transportation (Non-Medical): No  Physical Activity: Not on file  Stress: Not on file  Social Connections: Not on file    Current Medications:  Current Outpatient Medications:    allopurinol (ZYLOPRIM) 300 MG tablet, Take 300 mg by mouth daily., Disp: , Rfl:    amLODipine (NORVASC) 5 MG tablet, Take 5 mg by mouth daily., Disp: , Rfl:    atenolol (TENORMIN) 50 MG tablet, Take 50 mg by mouth daily., Disp: , Rfl:    atorvastatin (LIPITOR) 10 MG tablet, Take 10 mg by mouth daily., Disp: , Rfl:    cholecalciferol (VITAMIN D3) 25 MCG (1000 UNIT) tablet, Take 1,000 Units by mouth daily., Disp: , Rfl:    dexamethasone  (DECADRON) 4 MG tablet, Take 2 tablets at the night before and 2 tablets in the morning of chemotherapy, every 3 weeks, for 6 cycles, Disp: 36 tablet, Rfl: 6  lidocaine-prilocaine (EMLA) cream, Apply to affected area once, Disp: 30 g, Rfl: 3   meloxicam (MOBIC) 7.5 MG tablet, Take 7.5 mg by mouth daily., Disp: , Rfl:    ondansetron (ZOFRAN) 8 MG tablet, Take 1 tablet by mouth every 8 hours as needed for nausea or vomiting. Start on the third day after chemotherapy., Disp: 30 tablet, Rfl: 1   prochlorperazine (COMPAZINE) 10 MG tablet, Take 1 tablet (10 mg total) by mouth every 6 (six) hours as needed for nausea or vomiting., Disp: 30 tablet, Rfl: 1   traMADol (ULTRAM) 50 MG tablet, Take 2 tablets (100 mg total) by mouth every 6 (six) hours as needed for severe pain., Disp: 60 tablet, Rfl: 0   polyethylene glycol (MIRALAX / GLYCOLAX) 17 g packet, Take 17 g by mouth 2 (two) times daily., Disp: , Rfl:    senna-docusate (SENOKOT-S) 8.6-50 MG tablet, Take 2 tablets by mouth 2 (two) times daily., Disp: 30 tablet, Rfl: 0  Review of Systems: + vaginal bleeding Denies appetite changes, fevers, chills, fatigue, unexplained weight changes. Denies hearing loss, neck lumps or masses, mouth sores, ringing in ears or voice changes. Denies cough or wheezing.  Denies shortness of breath. Denies chest pain or palpitations. Denies leg swelling. Denies abdominal distention, pain, blood in stools, constipation, diarrhea, nausea, vomiting, or early satiety. Denies pain with intercourse, dysuria, hematuria or incontinence. Denies hot flashes, pelvic pain or vaginal discharge.   Denies joint pain, back pain or muscle pain/cramps. Denies itching, rash, or wounds. Denies dizziness, headaches, numbness or seizures. Denies swollen lymph nodes or glands, denies easy bruising or bleeding. Denies anxiety, depression, confusion, or decreased concentration.  Physical Exam: BP 122/72 (BP Location: Right Arm, Patient  Position: Sitting)   Pulse 79   Temp 98.5 F (36.9 C) (Tympanic)   Resp 18   Ht '5\' 8"'  (1.727 m)   Wt 215 lb (97.5 kg)   SpO2 100%   BMI 32.69 kg/m  General: Alert, oriented, no acute distress. HEENT: Normocephalic, atraumatic, sclera anicteric. Chest: Unlabored breathing on room air. Abdomen: Obese, soft, nontender.  Normoactive bowel sounds.  No masses or hepatosplenomegaly appreciated.  Well-healed incisions.  Laboratory & Radiologic Studies: CBC    Component Value Date/Time   WBC 1.1 (L) 10/02/2021 1227   WBC 5.6 09/23/2021 0716   RBC 3.25 (L) 10/02/2021 1227   HGB 8.1 (L) 10/02/2021 1227   HCT 25.8 (L) 10/02/2021 1227   PLT 170 10/02/2021 1227   MCV 79.4 (L) 10/02/2021 1227   MCH 24.9 (L) 10/02/2021 1227   MCHC 31.4 10/02/2021 1227   RDW 14.9 10/02/2021 1227   LYMPHSABS 0.6 (L) 10/02/2021 1227   MONOABS 0.2 10/02/2021 1227   EOSABS 0.1 10/02/2021 1227   BASOSABS 0.0 10/02/2021 1227      Latest Ref Rng & Units 10/02/2021   12:27 PM 09/23/2021    7:16 AM 09/01/2021   11:37 AM  BMP  Glucose 70 - 99 mg/dL 105  157  104   BUN 8 - 23 mg/dL '9  18  10   ' Creatinine 0.44 - 1.00 mg/dL 0.56  0.80  1.01   Sodium 135 - 145 mmol/L 136  140  137   Potassium 3.5 - 5.1 mmol/L 3.8  4.0  4.3   Chloride 98 - 111 mmol/L 100  107  102   CO2 22 - 32 mmol/L '29  23  26   ' Calcium 8.9 - 10.3 mg/dL 9.3  9.4  9.3    Assessment &  Plan: Sarah Wu is a 71 y.o. woman with Stage IV carcinosarcoma of the uterus who presents for follow-up after aborted debulking surgery.  Patient is doing well from a postoperative standpoint, meeting milestones.  Discussed continued restrictions.  Patient has started neoadjuvant chemotherapy.  Plan for 3 cycles and repeat imaging.  I will see her back after repeat imaging.  Plan discussed again with the patient and her daughter.  16 minutes of total time was spent for this patient encounter, including preparation, face-to-face counseling with the patient  and coordination of care, and documentation of the encounter.  Jeral Pinch, MD  Division of Gynecologic Oncology  Department of Obstetrics and Gynecology  [facility]

## 2021-10-02 NOTE — Assessment & Plan Note (Signed)
She has severe pancytopenia due to treatment She does not need transfusion support today or antibiotics coverage She will return next week for repeat blood work and transfusion as needed

## 2021-10-02 NOTE — Progress Notes (Signed)
[facility] OFFICE PROGRESS NOTE  Patient Care Team: [name], [name], DO as PCP - General (Family Medicine)  ASSESSMENT & PLAN:  Uterine cancer ([place]) Clinically, she is still having some persistent vaginal bleeding but not severe Continue supportive care She will return next week for repeat CBC count and transfusion as needed I will plan to repeat imaging study after 3 cycles of therapy  Pancytopenia, acquired ([place]) She has severe pancytopenia due to treatment She does not need transfusion support today or antibiotics coverage She will return next week for repeat blood work and transfusion as needed  Essential hypertension Her blood pressure is normal I emphasized importance of hydration  Decreased oral intake She has reduced oral intake I gave her daughter more flow sheet to document her oral intake I recommend the patient emphasizing on good protein intake and increase oral fluid as tolerated  Other constipation I recommend aggressive laxative therapy  Orders Placed This Encounter  Procedures   CBC with Differential ([place] Only)    Standing Status:   Future    Standing Expiration Date:   10/03/2022   Sample to Blood Bank    Standing Status:   Standing    Number of Occurrences:   33    Standing Expiration Date:   10/03/2022    All questions were answered. The patient knows to call the clinic with any problems, questions or concerns. The total time spent in the appointment was 30 minutes encounter with patients including review of chart and various tests results, discussions about plan of care and coordination of care plan   Heath Lark, MD 10/02/2021 3:44 PM  INTERVAL HISTORY: Please see below for problem oriented charting. she returns for treatment follow-up with her daughter I reviewed her documented flowsheet over the past 2 weeks Her oral intake is not great She has approximately 64 ounces of liquid per day She had intermittent  constipation No recent falls Denies recent fever or chills She continues to have intermittent vaginal bleeding  REVIEW OF SYSTEMS:   Constitutional: Denies fevers, chills or abnormal weight loss Eyes: Denies blurriness of vision Ears, nose, mouth, throat, and face: Denies mucositis or sore throat Respiratory: Denies cough, dyspnea or wheezes Cardiovascular: Denies palpitation, chest discomfort or lower extremity swelling Skin: Denies abnormal skin rashes Lymphatics: Denies new lymphadenopathy or easy bruising Neurological:Denies numbness, tingling or new weaknesses Behavioral/Psych: Mood is stable, no new changes  All other systems were reviewed with the patient and are negative.  I have reviewed the past medical history, past surgical history, social history and family history with the patient and they are unchanged from previous note.  ALLERGIES:  is allergic to hydrocodone-acetaminophen and oxycodone-acetaminophen.  MEDICATIONS:  Current Outpatient Medications  Medication Sig Dispense Refill   polyethylene glycol (MIRALAX / GLYCOLAX) 17 g packet Take 17 g by mouth 2 (two) times daily.     allopurinol (ZYLOPRIM) 300 MG tablet Take 300 mg by mouth daily.     amLODipine (NORVASC) 5 MG tablet Take 5 mg by mouth daily.     atenolol (TENORMIN) 50 MG tablet Take 50 mg by mouth daily.     atorvastatin (LIPITOR) 10 MG tablet Take 10 mg by mouth daily.     cholecalciferol (VITAMIN D3) 25 MCG (1000 UNIT) tablet Take 1,000 Units by mouth daily.     dexamethasone (DECADRON) 4 MG tablet Take 2 tablets at the night before and 2 tablets in the morning of chemotherapy, every 3 weeks, for 6  cycles 36 tablet 6   lidocaine-prilocaine (EMLA) cream Apply to affected area once 30 g 3   meloxicam (MOBIC) 7.5 MG tablet Take 7.5 mg by mouth daily.     ondansetron (ZOFRAN) 8 MG tablet Take 1 tablet by mouth every 8 hours as needed for nausea or vomiting. Start on the third day after chemotherapy. 30 tablet  1   prochlorperazine (COMPAZINE) 10 MG tablet Take 1 tablet (10 mg total) by mouth every 6 (six) hours as needed for nausea or vomiting. 30 tablet 1   senna-docusate (SENOKOT-S) 8.6-50 MG tablet Take 2 tablets by mouth 2 (two) times daily. 30 tablet 0   traMADol (ULTRAM) 50 MG tablet Take 2 tablets (100 mg total) by mouth every 6 (six) hours as needed for severe pain. 60 tablet 0   No current facility-administered medications for this visit.    SUMMARY OF ONCOLOGIC HISTORY: Oncology History Overview Note  Outside path showed carcinosarcoma, PD-L1 positive, MSI stable   Uterine cancer ([place])  07/30/2021 Initial Diagnosis   She was evaluated for post-menopausal bleeding. She reported having been seen a year previously and having a biopsy that was negative.  On exam in clinic, she was noted to have a mass present within the vagina.  This was thought to be a prolapsing fibroid and plan was made for exam under anesthesia, removal prolapsing fibroid with hysteroscopy and D&C   08/11/2021 Pathology Results   PD-L1 [phone]):                                             Does meet FDA-approval, Combined Positive Score (CPS): >=1   Pan-TRK IHC:                                              Tumor cells are Focally Positive for TRK protein expression.   MICROSATELLITE INSTABILITY:             MS-Stable   TUMOR MUTATION BURDEN:                10.8 Muts/Mb TMB-Low   OTHER BIOMARKERS:                              MET amplification PIK3CA amplification MDM2 amplification CCNE1 amplification   Pertinent Negative Biomarkers Evaluated by NGS: ALK ATM BARD1 BRAF BRCA1 BRCA2 CDK12 CHECK1 CHEK2 EGFR  ERBB2 ESR1 FANCL FGFR2 FGFR3 IDH1 IDH2 KIT KRAS NRAS NTRK1 NTRK2 NTRK3 PALB2 PDGFRA RAD51B RAD51C  RAD51D RAD54L RET ROS1   Comment:  The above studies are performed and reported by [facility] [place]) as part of the [facility] (NGS) panel.  Pertinent results are summarized above.  Please see their separate  reports for more detailed information  Addendum electronically signed by Elsworth Soho, MD on 09/03/2021 at  7:37 AM  Final Diagnosis    A.  CERVICAL MASS, EXCISION:       - CONSISTENT WITH CARCINOSARCOMA (HIGH GRADE MALIGNANT BIPHASIC TUMOR)  Electronically signed by Elsworth Soho, MD on 08/15/2021 at  9:53 AM  Comment    The malignant epithelial component exhibits high-grade cytomorphology and immunostaining pattern consistent with serous carcinoma to include strong positive diffuse reactivity for  p53, p16, and vimentin, focal subset CK7 and CK AE1/AE3 expression, with high Ki-67 staining (approximately 60%).  Neuroendocrine differentiation is present (CD56 negative but synaptophysin positive).  The stromal component is composed of malignant mitotically active cells and heterologous elements with focal chondroid differentiation (S100 positive).  SMA appears to decorate rare tumor cells and highlights vascular structures.  The tumor cells are non-reactive for CK20, WT1, GATA3, p40 and desmin.  Block A5 is [facility] to [facility] [place] to perform molecular characterization.  These findings are shared with Dr. Luanne Bras on [phone] at [phone] hours by telephone conversation. This case received prospective intradepartmental quality assurance review.   Clinical Information    Cervical mass  Gross Description    A. Cervix Received fresh labeled with the patient's name and date of birth and "cervical mass frozen section" per container is an aggregate of red-tan friable soft tissue measuring approximately 5 x 5 x 3 cm in toto.  Representative sections are frozen for an intraoperative consultation.  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   Results  rendered by Dr. Wyatt Portela and reported to Dr. Macarthur Critchley at [facility] on 08/11/2021 at 8:06 AM.  Microscopic Description    Microscopic examination supports the above diagnosis.  Single antibody immunostains are performed to include: S100, p40, p16, p53, PAX8, CK7, CK20, vimentin, CK AE1/3, GATA3, WT1, desmin, SMA, synaptophysin, CD56, and Ki-67.  All controls are appropriately reactive. [id number], [id number], [id number], [id number], [id number], [id number] (15)          08/22/2021 Initial Diagnosis   Carcinosarcoma ([place])   09/01/2021 Imaging   CT abdomen and pelvis There is marked enlargement of the uterus measuring 13.7 x 10 x 8 x 10.1 cm. There is inhomogeneous enhancement in the uterus consistent with malignant neoplasm.   There are enlarged lymph nodes in the retroperitoneum in the para-and paracaval regions measuring up to 12 mm in short axis. There are a few enlarged lymph nodes adjacent to the iliac vessels largest measuring 2.6 x 1.4 cm adjacent to the right external iliac vessels. Findings suggest possible metastatic lymphadenopathy. There is mild prominence of the pelvocaliceal system in the left kidney and proximal left ureter. Possibility of extrinsic compression or infiltrative process related to the uterine neoplasm in the distal course of left ureter causing mild obstruction is not excluded.   Gallbladder stone.  Diverticulosis of colon.   Other findings as described in the body of the report.   09/01/2021 Imaging   US pelvis Large central uterine ill-defined mass approximately 7.7 cm diameter enlarging uterus likely representing the biopsy-proven neoplasm, likely of endometrial origin.   Questionable intramural leiomyoma posterior mid uterus 3.1 cm diameter.   The mass extend and the adnexal regions are poorly characterized but may potentially be better evaluated by MR imaging with and without contrast.   09/10/2021 Surgery   Pre-operative Diagnosis: carcinosarcoma of the uterus, adenopathy on imaging    Post-operative Diagnosis: same, stage IVB carcinosarcoma of the uterus   Operation: Diagnostic laparoscopy    Surgeon: Jeral Pinch MD Operative Findings: On EUA, cervix 2 cm dilated with tumor noted within the os. Cervix otherwise normal appearing. Uterus enlarged, 12 cm, with limited mobility secondary to weight. Compression of the rectum from uterus although no direct invasion appreciated. On intra-abdominal inspection, adhesions  noted between the liver and the anterior abdominal wall. Evidence of carcinomatosis involving liver surface, diaphragm, omentum, anterior abdominal wall. Surface of the uterus with obvious tumor infiltration. Small bowel free from uterus. Posteriorly, sigmoid densely adherent to the posterior uterus. Even with manipulator, unable to move the uterus anteriorly. Minimal ascites.    Given findings of stage IV disease and what would require large bowel resection and end ostomy, decision made to abort surgery in favor of neoadjuvant chemotherapy. I had discussed this as a distinct possibility when we had gotten her CT results as well as this morning prior to the surgery.    09/15/2021 Cancer Staging   Staging form: Corpus Uteri - Carcinoma and Carcinosarcoma, AJCC 8th Edition - Clinical stage from 09/15/2021: FIGO Stage IVB (cT3, cN2a, pM1) - Signed by Heath Lark, MD on 09/15/2021 Stage prefix: Initial diagnosis   09/24/2021 -  Chemotherapy   Patient is on Treatment Plan : UTERINE ENDOMETRIAL Dostarlimab-gxly (500 mg) + Carboplatin (AUC 5) + Paclitaxel (175 mg/m2) q21d x 6 cycles / Dostarlimab-gxly (1000 mg) q42d x 6 cycles        PHYSICAL EXAMINATION: ECOG PERFORMANCE STATUS: 1 - Symptomatic but completely ambulatory  Vitals:   10/02/21 1309  BP: 122/72  Pulse: 79  Resp: 18  Temp: 98.5 F (36.9 C)  SpO2: 100%   Filed Weights   10/02/21 1309  Weight: 215 lb 14.4 oz (97.9 kg)    GENERAL:alert, no distress and comfortable NEURO: alert & oriented x 3 with  fluent speech, no focal motor/sensory deficits  LABORATORY DATA:  I have reviewed the data as listed    Component Value Date/Time   NA 136 10/02/2021 1227   K 3.8 10/02/2021 1227   CL 100 10/02/2021 1227   CO2 29 10/02/2021 1227   GLUCOSE 105 (H) 10/02/2021 1227   BUN 9 10/02/2021 1227   CREATININE 0.56 10/02/2021 1227   CALCIUM 9.3 10/02/2021 1227   PROT 6.8 10/02/2021 1227   ALBUMIN 3.7 10/02/2021 1227   AST 20 10/02/2021 1227   ALT 15 10/02/2021 1227   ALKPHOS 48 10/02/2021 1227   BILITOT 0.8 10/02/2021 1227   GFRNONAA >60 10/02/2021 1227    No results found for: "SPEP", "UPEP"  Lab Results  Component Value Date   WBC 1.1 (L) 10/02/2021   NEUTROABS 0.2 (LL) 10/02/2021   HGB 8.1 (L) 10/02/2021   HCT 25.8 (L) 10/02/2021   MCV 79.4 (L) 10/02/2021   PLT 170 10/02/2021      Chemistry      Component Value Date/Time   NA 136 10/02/2021 1227   K 3.8 10/02/2021 1227   CL 100 10/02/2021 1227   CO2 29 10/02/2021 1227   BUN 9 10/02/2021 1227   CREATININE 0.56 10/02/2021 1227      Component Value Date/Time   CALCIUM 9.3 10/02/2021 1227   ALKPHOS 48 10/02/2021 1227   AST 20 10/02/2021 1227   ALT 15 10/02/2021 1227   BILITOT 0.8 10/02/2021 1227       RADIOGRAPHIC STUDIES: I have personally reviewed the radiological images as listed and agreed with the findings in the report. IR IMAGING GUIDED PORT INSERTION  Result Date: 09/23/2021 INDICATION: 71 year old female referred for port catheter placement EXAM: IMAGE GUIDED PORT CATHETER PLACEMENT MEDICATIONS: None ANESTHESIA/SEDATION: Moderate (conscious) sedation was employed during this procedure. A total of Versed 3.0 mg and Fentanyl 100 mcg was administered intravenously. Moderate Sedation Time: 22 minutes. The patient's level of consciousness and vital signs were monitored  continuously by radiology nursing throughout the procedure under my direct supervision. FLUOROSCOPY TIME:  Fluoroscopy Time:  (0 mGy). COMPLICATIONS:  None immediate. PROCEDURE: The procedure, risks, benefits, and alternatives were explained to the patient. Questions regarding the procedure were encouraged and answered. The patient understands and consents to the procedure. Ultrasound survey was performed with images stored and sent to PACs. Right IJ vein documented to be patent. The right neck and chest was prepped with chlorhexidine, and draped in the usual sterile fashion using maximum barrier technique (cap and mask, sterile gown, sterile gloves, large sterile sheet, hand hygiene and cutaneous antiseptic). Local anesthesia was attained by infiltration with 1% lidocaine without epinephrine. Ultrasound demonstrated patency of the right internal jugular vein, and this was documented with an image. Under real-time ultrasound guidance, this vein was accessed with a 21 gauge micropuncture needle and image documentation was performed. A small dermatotomy was made at the access site with an 11 scalpel. A 0.018" wire was advanced into the SVC and used to estimate the length of the internal catheter. The access needle exchanged for a 73F micropuncture vascular sheath. The 0.018" wire was then removed and a 0.035" wire advanced into the IVC. An appropriate location for the subcutaneous reservoir was selected below the clavicle and an incision was made through the skin and underlying soft tissues. The subcutaneous tissues were then dissected using a combination of blunt and sharp surgical technique and a pocket was formed. A single lumen power injectable portacatheter was then tunneled through the subcutaneous tissues from the pocket to the dermatotomy and the port reservoir placed within the subcutaneous pocket. The venous access site was then serially dilated and a peel away vascular sheath placed over the wire. The wire was removed and the port catheter advanced into position under fluoroscopic guidance. The catheter tip is positioned in the cavoatrial junction. This  was documented with a spot image. The portacatheter was then tested and found to flush and aspirate well. The port was flushed with saline followed by 100 units/mL heparinized saline. The pocket was then closed in two layers using first subdermal inverted interrupted absorbable sutures followed by a running subcuticular suture. The epidermis was then sealed with Dermabond. The dermatotomy at the venous access site was also seal with Dermabond. Patient tolerated the procedure well and remained hemodynamically stable throughout. No complications encountered and no significant blood loss encountered IMPRESSION: Status post right IJ port catheter placement. Signed, Dulcy Fanny. Nadene Rubins, RPVI [facility] Rad[facility] Electronically Signed   By: Corrie Mckusick D.O.   On: 09/23/2021 11:15

## 2021-10-02 NOTE — Research (Signed)
A Randomized pragmatic Chair-Based Home Exercise Intervention for Mitigating Cancer-Related Fatigue in Older Adults Undergoing Chemotherapy for Advanced Disease   10/03/22  BASELINE: Patient arrived accompanied by her daughter. Patient unable to complete baseline assessments at last visit due to chemotherapy schedule as well as commute. All baseline assessments, except PROs, were completed today.  SPPB: Patient able to complete all balance tests without any issue. Gait Speed Test was done at 4 meters per protocol. Patient was able to complete this test with a cane as a walking aid. Patient was not able to complete the chair stand test. She did not feel safe doing this at this time.  After completion of the above tests, patient and her daughter were given chair exercise education by this clinical Research officer, political party. Patient was provided [place] as well as weekly exercise logs. Patient was educated on how to complete exercises as well as stretches and complete daily exercise logs. Patient and daughter did not have any questions at this time. Patient was encouraged to contact this coordinator with any questions or concerns.  Informed patient this coordinator would be making periodic phone calls at 1 week, 3 weeks, and 5 weeks. Also informed patient our next in-person visit would be week 6 and we would complete same SPPB as today as well be given more daily exercise logs. Patient and daughter expressed understanding.  After all tasks were complete, patient was provided $25 [employer] gift card [phone]) and Baseline form for gift card was faxed to [phone]. Thanked patient and daughter for their time and continued support of the above mentioned study.   Carol Ada, RT(R)(T) Clinical Research Coordinator

## 2021-10-02 NOTE — Patient Instructions (Signed)
It was good to see you today.  Your incisions are healing well.  Remember no heavy lifting for 6 weeks after surgery.

## 2021-10-04 LAB — T4: T4, Total: 8.5 ug/dL (ref 4.5–12.0)

## 2021-10-06 ENCOUNTER — Other Ambulatory Visit: Payer: Self-pay

## 2021-10-06 ENCOUNTER — Telehealth: Payer: Self-pay

## 2021-10-06 ENCOUNTER — Inpatient Hospital Stay: Payer: Medicare Other

## 2021-10-06 ENCOUNTER — Other Ambulatory Visit: Payer: Self-pay | Admitting: Hematology and Oncology

## 2021-10-06 DIAGNOSIS — C549 Malignant neoplasm of corpus uteri, unspecified: Secondary | ICD-10-CM

## 2021-10-06 DIAGNOSIS — D638 Anemia in other chronic diseases classified elsewhere: Secondary | ICD-10-CM

## 2021-10-06 DIAGNOSIS — C801 Malignant (primary) neoplasm, unspecified: Secondary | ICD-10-CM

## 2021-10-06 DIAGNOSIS — D61818 Other pancytopenia: Secondary | ICD-10-CM

## 2021-10-06 DIAGNOSIS — Z5111 Encounter for antineoplastic chemotherapy: Secondary | ICD-10-CM | POA: Diagnosis not present

## 2021-10-06 LAB — CBC WITH DIFFERENTIAL (CANCER CENTER ONLY)
Abs Immature Granulocytes: 0.25 10*3/uL — ABNORMAL HIGH (ref 0.00–0.07)
Basophils Absolute: 0 10*3/uL (ref 0.0–0.1)
Basophils Relative: 1 %
Eosinophils Absolute: 0 10*3/uL (ref 0.0–0.5)
Eosinophils Relative: 1 %
HCT: 24.3 % — ABNORMAL LOW (ref 36.0–46.0)
Hemoglobin: 7.6 g/dL — ABNORMAL LOW (ref 12.0–15.0)
Immature Granulocytes: 4 %
Lymphocytes Relative: 50 %
Lymphs Abs: 3.3 10*3/uL (ref 0.7–4.0)
MCH: 25.1 pg — ABNORMAL LOW (ref 26.0–34.0)
MCHC: 31.3 g/dL (ref 30.0–36.0)
MCV: 80.2 fL (ref 80.0–100.0)
Monocytes Absolute: 0.1 10*3/uL (ref 0.1–1.0)
Monocytes Relative: 2 %
Neutro Abs: 2.7 10*3/uL (ref 1.7–7.7)
Neutrophils Relative %: 42 %
Platelet Count: 209 10*3/uL (ref 150–400)
RBC: 3.03 MIL/uL — ABNORMAL LOW (ref 3.87–5.11)
RDW: 15.3 % (ref 11.5–15.5)
Smear Review: NORMAL
WBC Count: 6.4 10*3/uL (ref 4.0–10.5)
nRBC: 0.8 % — ABNORMAL HIGH (ref 0.0–0.2)

## 2021-10-06 LAB — SAMPLE TO BLOOD BANK

## 2021-10-06 LAB — PREPARE RBC (CROSSMATCH)

## 2021-10-06 NOTE — Telephone Encounter (Signed)
Called and reminded daughter to leave blood bracelet on for blood transfusion tomorrow. Given hgb results. She verbalized understanding.

## 2021-10-06 NOTE — Telephone Encounter (Signed)
Paperwork returned and received by this nurse today from collaborative/provider.  Successfully returned to [facility] via fax [phone]).   Original copy to alphabetical file folder for patient pick up near registrar number one of [facility] appointment registration work area.   Process completed with copy of form to H.I.M. bin designated for items to be scanned.  No further instructions received, actions required or performed by this nurse.

## 2021-10-07 ENCOUNTER — Inpatient Hospital Stay: Payer: Medicare Other

## 2021-10-07 ENCOUNTER — Other Ambulatory Visit: Payer: Self-pay

## 2021-10-07 DIAGNOSIS — D638 Anemia in other chronic diseases classified elsewhere: Secondary | ICD-10-CM

## 2021-10-07 DIAGNOSIS — C549 Malignant neoplasm of corpus uteri, unspecified: Secondary | ICD-10-CM

## 2021-10-07 DIAGNOSIS — Z5111 Encounter for antineoplastic chemotherapy: Secondary | ICD-10-CM | POA: Diagnosis not present

## 2021-10-07 DIAGNOSIS — C801 Malignant (primary) neoplasm, unspecified: Secondary | ICD-10-CM

## 2021-10-07 MED ORDER — DIPHENHYDRAMINE HCL 25 MG PO CAPS
25.0000 mg | ORAL_CAPSULE | Freq: Once | ORAL | Status: AC
  Administered 2021-10-07: 25 mg via ORAL
  Filled 2021-10-07: qty 1

## 2021-10-07 MED ORDER — SODIUM CHLORIDE 0.9% IV SOLUTION: 250.0000 mL | Freq: Once | INTRAVENOUS | Status: DC

## 2021-10-07 NOTE — Patient Instructions (Signed)

## 2021-10-08 LAB — BPAM RBC
Blood Product Expiration Date: 202309202359
ISSUE DATE / TIME: 202308290900
Unit Type and Rh: 6200

## 2021-10-08 LAB — TYPE AND SCREEN
ABO/RH(D): A POS
Antibody Screen: NEGATIVE
Unit division: 0

## 2021-10-09 ENCOUNTER — Encounter: Payer: Self-pay | Admitting: Radiology

## 2021-10-09 DIAGNOSIS — C549 Malignant neoplasm of corpus uteri, unspecified: Secondary | ICD-10-CM

## 2021-10-09 NOTE — Research (Signed)
A Randomized pragmatic Chair-Based Home Exercise Intervention for Mitigating Cancer-Related Fatigue in Older Adults Undergoing Chemotherapy for Advanced Disease   10/09/2021  ONE WEEK PHONE CALL: Spoke with Sarah Wu and daughter [name] for one week follow-up phone call for the above mentioned study. [name] stated the exercises have been going well and they have been keeping a daily log. At this time the patient is unable to do the hip lengthening and hip flexion exercises. She has has difficulty with the hip abduction exercises as well. All other exercises have been tolerated well. Exercises have been an average of 20 minutes a day with an RPE between a 3-4 each day. There were 2 days where patient was unable to complete exercises: one day due to fatigue and one day due to multiple appts. Patient and her daughter were thanked for their time and continued support of the above mentioned study. Informed Jamal next follow-up phone call will be in about 2 weeks.    Carol Ada, RT(R)(T) Clinical Research Coordinator

## 2021-10-14 ENCOUNTER — Ambulatory Visit (HOSPITAL_COMMUNITY)
Admission: RE | Admit: 2021-10-14 | Discharge: 2021-10-14 | Disposition: A | Discharge status: Home / Self Care | Payer: Medicare Other | Source: Ambulatory Visit | Attending: Hematology and Oncology | Admitting: Hematology and Oncology

## 2021-10-14 ENCOUNTER — Other Ambulatory Visit: Payer: Self-pay | Admitting: Hematology and Oncology

## 2021-10-14 DIAGNOSIS — C801 Malignant (primary) neoplasm, unspecified: Secondary | ICD-10-CM

## 2021-10-14 DIAGNOSIS — C549 Malignant neoplasm of corpus uteri, unspecified: Secondary | ICD-10-CM | POA: Insufficient documentation

## 2021-10-14 DIAGNOSIS — Z452 Encounter for adjustment and management of vascular access device: Secondary | ICD-10-CM | POA: Insufficient documentation

## 2021-10-14 HISTORY — PX: IR CHEST FLUORO: IMG2383

## 2021-10-15 ENCOUNTER — Other Ambulatory Visit: Payer: Self-pay | Admitting: Radiology

## 2021-10-15 MED FILL — Fosaprepitant Dimeglumine For IV Infusion 150 MG (Base Eq): INTRAVENOUS | Qty: 5 | Status: AC

## 2021-10-15 MED FILL — Dexamethasone Sodium Phosphate Inj 100 MG/10ML: INTRAMUSCULAR | Qty: 1 | Status: AC

## 2021-10-16 ENCOUNTER — Inpatient Hospital Stay: Payer: Medicare Other

## 2021-10-16 ENCOUNTER — Other Ambulatory Visit: Payer: Self-pay | Admitting: Internal Medicine

## 2021-10-16 ENCOUNTER — Inpatient Hospital Stay (HOSPITAL_BASED_OUTPATIENT_CLINIC_OR_DEPARTMENT_OTHER): Payer: Medicare Other | Admitting: Hematology and Oncology

## 2021-10-16 ENCOUNTER — Encounter: Payer: Self-pay | Admitting: Hematology and Oncology

## 2021-10-16 ENCOUNTER — Inpatient Hospital Stay: Payer: Medicare Other | Attending: Gynecologic Oncology

## 2021-10-16 ENCOUNTER — Other Ambulatory Visit (HOSPITAL_COMMUNITY): Payer: Self-pay

## 2021-10-16 DIAGNOSIS — C55 Malignant neoplasm of uterus, part unspecified: Secondary | ICD-10-CM | POA: Insufficient documentation

## 2021-10-16 DIAGNOSIS — Z79899 Other long term (current) drug therapy: Secondary | ICD-10-CM | POA: Insufficient documentation

## 2021-10-16 DIAGNOSIS — R600 Localized edema: Secondary | ICD-10-CM | POA: Diagnosis not present

## 2021-10-16 DIAGNOSIS — D638 Anemia in other chronic diseases classified elsewhere: Secondary | ICD-10-CM | POA: Diagnosis not present

## 2021-10-16 DIAGNOSIS — Z5111 Encounter for antineoplastic chemotherapy: Secondary | ICD-10-CM | POA: Insufficient documentation

## 2021-10-16 DIAGNOSIS — D649 Anemia, unspecified: Secondary | ICD-10-CM | POA: Diagnosis not present

## 2021-10-16 DIAGNOSIS — K5909 Other constipation: Secondary | ICD-10-CM | POA: Insufficient documentation

## 2021-10-16 DIAGNOSIS — C549 Malignant neoplasm of corpus uteri, unspecified: Secondary | ICD-10-CM

## 2021-10-16 LAB — CBC WITH DIFFERENTIAL (CANCER CENTER ONLY)
Abs Immature Granulocytes: 0.03 10*3/uL (ref 0.00–0.07)
Basophils Absolute: 0 10*3/uL (ref 0.0–0.1)
Basophils Relative: 0 %
Eosinophils Absolute: 0 10*3/uL (ref 0.0–0.5)
Eosinophils Relative: 0 %
HCT: 29.8 % — ABNORMAL LOW (ref 36.0–46.0)
Hemoglobin: 9.2 g/dL — ABNORMAL LOW (ref 12.0–15.0)
Immature Granulocytes: 0 %
Lymphocytes Relative: 10 %
Lymphs Abs: 0.9 10*3/uL (ref 0.7–4.0)
MCH: 25.8 pg — ABNORMAL LOW (ref 26.0–34.0)
MCHC: 30.9 g/dL (ref 30.0–36.0)
MCV: 83.5 fL (ref 80.0–100.0)
Monocytes Absolute: 0.1 10*3/uL (ref 0.1–1.0)
Monocytes Relative: 2 %
Neutro Abs: 7.4 10*3/uL (ref 1.7–7.7)
Neutrophils Relative %: 88 %
Platelet Count: 371 10*3/uL (ref 150–400)
RBC: 3.57 MIL/uL — ABNORMAL LOW (ref 3.87–5.11)
RDW: 17.8 % — ABNORMAL HIGH (ref 11.5–15.5)
WBC Count: 8.4 10*3/uL (ref 4.0–10.5)
nRBC: 0 % (ref 0.0–0.2)

## 2021-10-16 LAB — CMP (CANCER CENTER ONLY)
ALT: 8 U/L (ref 0–44)
AST: 17 U/L (ref 15–41)
Albumin: 3.7 g/dL (ref 3.5–5.0)
Alkaline Phosphatase: 49 U/L (ref 38–126)
Anion gap: 6 (ref 5–15)
BUN: 12 mg/dL (ref 8–23)
CO2: 28 mmol/L (ref 22–32)
Calcium: 9.3 mg/dL (ref 8.9–10.3)
Chloride: 105 mmol/L (ref 98–111)
Creatinine: 0.7 mg/dL (ref 0.44–1.00)
GFR, Estimated: >60 mL/min (ref >60)
Glucose, Bld: 117 mg/dL — ABNORMAL HIGH (ref 70–99)
Potassium: 3.8 mmol/L (ref 3.5–5.1)
Sodium: 139 mmol/L (ref 135–145)
Total Bilirubin: 0.4 mg/dL (ref 0.3–1.2)
Total Protein: 7.2 g/dL (ref 6.5–8.1)

## 2021-10-16 MED ORDER — SODIUM CHLORIDE 0.9 % IV SOLN
500.0000 mg | Freq: Once | INTRAVENOUS | Status: AC
  Administered 2021-10-16: 500 mg via INTRAVENOUS
  Filled 2021-10-16: qty 10

## 2021-10-16 MED ORDER — PALONOSETRON HCL INJECTION 0.25 MG/5ML
0.2500 mg | Freq: Once | INTRAVENOUS | Status: AC
  Administered 2021-10-16: 0.25 mg via INTRAVENOUS
  Filled 2021-10-16: qty 5

## 2021-10-16 MED ORDER — SODIUM CHLORIDE 0.9 % IV SOLN
535.0000 mg | Freq: Once | INTRAVENOUS | Status: AC
  Administered 2021-10-16: 540 mg via INTRAVENOUS
  Filled 2021-10-16: qty 54

## 2021-10-16 MED ORDER — SODIUM CHLORIDE 0.9 % IV SOLN
175.0000 mg/m2 | Freq: Once | INTRAVENOUS | Status: AC
  Administered 2021-10-16: 384 mg via INTRAVENOUS
  Filled 2021-10-16: qty 64

## 2021-10-16 MED ORDER — CETIRIZINE HCL 10 MG/ML IV SOLN
10.0000 mg | Freq: Once | INTRAVENOUS | Status: AC
  Administered 2021-10-16: 10 mg via INTRAVENOUS
  Filled 2021-10-16: qty 1

## 2021-10-16 MED ORDER — DEXAMETHASONE 4 MG PO TABS
ORAL_TABLET | ORAL | 6 refills | Status: DC
  Filled 2021-10-16: qty 16, 84d supply, fill #0
  Filled 2021-10-20: qty 16, 84d supply, fill #1

## 2021-10-16 MED ORDER — FAMOTIDINE IN NACL 20-0.9 MG/50ML-% IV SOLN
20.0000 mg | Freq: Once | INTRAVENOUS | Status: AC
  Administered 2021-10-16: 20 mg via INTRAVENOUS
  Filled 2021-10-16: qty 50

## 2021-10-16 MED ORDER — SODIUM CHLORIDE 0.9 % IV SOLN
150.0000 mg | Freq: Once | INTRAVENOUS | Status: AC
  Administered 2021-10-16: 150 mg via INTRAVENOUS
  Filled 2021-10-16: qty 150

## 2021-10-16 MED ORDER — SODIUM CHLORIDE 0.9 % IV SOLN: Freq: Once | INTRAVENOUS | Status: AC

## 2021-10-16 MED ORDER — FUROSEMIDE 20 MG PO TABS
20.0000 mg | ORAL_TABLET | Freq: Every day | ORAL | 0 refills | Status: DC
  Filled 2021-10-16: qty 30, 30d supply, fill #0

## 2021-10-16 MED ORDER — SODIUM CHLORIDE 0.9 % IV SOLN
10.0000 mg | Freq: Once | INTRAVENOUS | Status: AC
  Administered 2021-10-16: 10 mg via INTRAVENOUS
  Filled 2021-10-16: qty 10

## 2021-10-16 NOTE — Progress Notes (Signed)
[facility] OFFICE PROGRESS NOTE  Patient Care Team: [name], [name], DO as PCP - General (Family Medicine)  ASSESSMENT & PLAN:  Uterine cancer ([place]) Clinically, she is improving We will proceed with treatment without delay We discussed importance of frequent small meals and avoidance of constipation I plan to repeat imaging study after cycle 3  Anemia, chronic disease This is likely anemia of chronic disease. The patient denies recent history of bleeding such as epistaxis, hematuria or hematochezia. She is asymptomatic from the anemia. We will observe for now.  She does not require transfusion now. I do not recommend any further work-up at this time.    Bilateral edema of lower extremity I recommend elastic compression hose, frequent ambulation and leg elevation as much as possible She is retaining fluid I recommend low-dose furosemide for 4 days Her daughter is instructed to monitor her weight carefully  Other constipation She is prone to get constipated We discussed importance of regular laxatives  No orders of the defined types were placed in this encounter.   All questions were answered. The patient knows to call the clinic with any problems, questions or concerns. The total time spent in the appointment was 30 minutes encounter with patients including review of chart and various tests results, discussions about plan of care and coordination of care plan   Heath Lark, MD 10/16/2021 12:09 PM  INTERVAL HISTORY: Please see below for problem oriented charting. she returns for treatment follow-up seen prior to cycle 2 of treatment Her port is not working and she has port revisions procedure tomorrow She has gained a lot of weight She is noted to have significant fluid retention with leg swelling She is having regular bowel movement now with aggressive laxative She has good oral intake  REVIEW OF SYSTEMS:   Constitutional: Denies fevers, chills  Eyes: Denies  blurriness of vision Ears, nose, mouth, throat, and face: Denies mucositis or sore throat Respiratory: Denies cough, dyspnea or wheezes Cardiovascular: Denies palpitation, chest discomfort  Gastrointestinal:  Denies nausea, heartburn or change in bowel habits Skin: Denies abnormal skin rashes Lymphatics: Denies new lymphadenopathy or easy bruising Neurological:Denies numbness, tingling or new weaknesses Behavioral/Psych: Mood is stable, no new changes  All other systems were reviewed with the patient and are negative.  I have reviewed the past medical history, past surgical history, social history and family history with the patient and they are unchanged from previous note.  ALLERGIES:  is allergic to hydrocodone-acetaminophen and oxycodone-acetaminophen.  MEDICATIONS:  Current Outpatient Medications  Medication Sig Dispense Refill   furosemide (LASIX) 20 MG tablet Take 1 tablet (20 mg total) by mouth daily. 30 tablet 0   allopurinol (ZYLOPRIM) 300 MG tablet Take 300 mg by mouth daily.     amLODipine (NORVASC) 5 MG tablet Take 5 mg by mouth daily.     atenolol (TENORMIN) 50 MG tablet Take 50 mg by mouth daily.     atorvastatin (LIPITOR) 10 MG tablet Take 10 mg by mouth at bedtime.     cholecalciferol (VITAMIN D3) 25 MCG (1000 UNIT) tablet Take 1,000 Units by mouth daily.     dexamethasone (DECADRON) 4 MG tablet Take 2 tablets by mouth the night before and 2 tablets the morning of chemotherapy, every 3 weeks, for 6 cycles 36 tablet 6   ferrous sulfate 325 (65 FE) MG tablet Take 325 mg by mouth daily.     lidocaine-prilocaine (EMLA) cream Apply to affected area once 30 g 3  meloxicam (MOBIC) 7.5 MG tablet Take 7.5 mg by mouth daily.     Multiple Vitamins-Minerals (ADULT GUMMY) CHEW Chew 2 capsules by mouth daily.     ondansetron (ZOFRAN) 8 MG tablet Take 1 tablet by mouth every 8 hours as needed for nausea or vomiting. Start on the third day after chemotherapy. 30 tablet 1    polyethylene glycol (MIRALAX / GLYCOLAX) 17 g packet Take 17 g by mouth 2 (two) times daily.     prochlorperazine (COMPAZINE) 10 MG tablet Take 1 tablet (10 mg total) by mouth every 6 (six) hours as needed for nausea or vomiting. 30 tablet 1   senna-docusate (SENOKOT-S) 8.6-50 MG tablet Take 2 tablets by mouth 2 (two) times daily. (Patient not taking: Reported on 10/15/2021) 30 tablet 0   traMADol (ULTRAM) 50 MG tablet Take 2 tablets (100 mg total) by mouth every 6 (six) hours as needed for severe pain. (Patient not taking: Reported on 10/15/2021) 60 tablet 0   No current facility-administered medications for this visit.   Facility-Administered Medications Ordered in Other Visits  Medication Dose Route Frequency Provider Last Rate Last Admin   CARBOplatin (PARAPLATIN) 540 mg in sodium chloride 0.9 % 250 mL chemo infusion  540 mg Intravenous Once Alvy Bimler, Romie Keeble, MD       dostarlimab-gxly (JEMPERLI) 500 mg in sodium chloride 0.9 % 100 mL (4.5455 mg/mL) chemo infusion  500 mg Intravenous Once Alvy Bimler, Agamjot Kilgallon, MD       PACLitaxel (TAXOL) 384 mg in sodium chloride 0.9 % 500 mL chemo infusion (> 64m/m2)  175 mg/m2 (Treatment Plan Recorded) Intravenous Once GHeath Lark MD        SUMMARY OF ONCOLOGIC HISTORY: Oncology History Overview Note  Outside path showed carcinosarcoma, PD-L1 positive, MSI stable   Uterine cancer ([place]  07/30/2021 Initial Diagnosis   She was evaluated for post-menopausal bleeding. She reported having been seen a year previously and having a biopsy that was negative.  On exam in clinic, she was noted to have a mass present within the vagina.  This was thought to be a prolapsing fibroid and plan was made for exam under anesthesia, removal prolapsing fibroid with hysteroscopy and D&C   08/11/2021 Pathology Results   PD-L1 ([id number]:                                             Does meet FDA-approval, Combined Positive Score (CPS): >=1   Pan-TRK IHC:                                               Tumor cells are Focally Positive for TRK protein expression.   MICROSATELLITE INSTABILITY:             MS-Stable   TUMOR MUTATION BURDEN:                10.8 Muts/Mb TMB-Low   OTHER BIOMARKERS:                              MET amplification PIK3CA amplification MDM2 amplification CCNE1 amplification   Pertinent Negative Biomarkers Evaluated by NGS: ALK ATM BARD1 BRAF BRCA1 BRCA2 CDK12 CHECK1 CHEK2 EGFR  ERBB2 ESR1 FANCL FGFR2 FGFR3 IDH1  IDH2 KIT KRAS NRAS NTRK1 NTRK2 NTRK3 PALB2 PDGFRA RAD51B RAD51C  RAD51D RAD54L RET ROS1   Comment:  The above studies are performed and reported by [facility], [place]) as part of the [facility] (NGS) panel.  Pertinent results are summarized above.  Please see their separate reports for more detailed information  Addendum electronically signed by Elsworth Soho, MD on 09/03/2021 at  7:37 AM  Final Diagnosis    A.  CERVICAL MASS, EXCISION:       - CONSISTENT WITH CARCINOSARCOMA (HIGH GRADE MALIGNANT BIPHASIC TUMOR)  Electronically signed by Elsworth Soho, MD on 08/15/2021 at  9:53 AM  Comment    The malignant epithelial component exhibits high-grade cytomorphology and immunostaining pattern consistent with serous carcinoma to include strong positive diffuse reactivity for p53, p16, and vimentin, focal subset CK7 and CK AE1/AE3 expression, with high Ki-67 staining (approximately 60%).  Neuroendocrine differentiation is present (CD56 negative but synaptophysin positive).  The stromal component is composed of malignant mitotically active cells and heterologous elements with focal chondroid differentiation (S100 positive).  SMA appears to decorate rare tumor cells and highlights vascular structures.  The tumor cells are non-reactive for CK20, WT1, GATA3, p40 and desmin.  Block A5 is referred to [facility], [place] to perform molecular characterization.  These findings are shared with Dr. Luanne Bras on [phone] at [phone] hours by telephone  conversation. This case received prospective intradepartmental quality assurance review.   Clinical Information    Cervical mass  Gross Description    A. Cervix Received fresh labeled with the patient's name and date of birth and "cervical mass frozen section" per container is an aggregate of red-tan friable soft tissue measuring approximately 5 x 5 x 3 cm in toto.  Representative sections are frozen for an intraoperative consultation.  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   Results rendered by Dr. Wyatt Portela and reported to Dr. Macarthur Critchley at [facility] on 08/11/2021 at 8:06 AM.  Microscopic Description    Microscopic examination supports the above diagnosis.  Single antibody immunostains are performed to include: S100, p40, p16, p53, PAX8, CK7, CK20, vimentin, CK AE1/3, GATA3, WT1, desmin, SMA, synaptophysin, CD56, and Ki-67.  All controls are appropriately reactive. [id number], [id number], [id number], [id number], [id number], [id number] (15)          08/22/2021 Initial Diagnosis   Carcinosarcoma ([place])   09/01/2021 Imaging   CT abdomen and pelvis There is marked enlargement of the uterus measuring 13.7 x 10 x 8 x 10.1 cm. There is inhomogeneous enhancement in the uterus consistent with malignant neoplasm.   There are enlarged lymph nodes in the retroperitoneum in the para-and paracaval regions measuring up to 12 mm in short axis. There are a few enlarged lymph nodes adjacent to the iliac vessels largest measuring 2.6 x 1.4 cm adjacent to the right external iliac vessels. Findings suggest possible metastatic lymphadenopathy. There is mild prominence of the pelvocaliceal system in the left kidney and proximal left ureter. Possibility of extrinsic compression or infiltrative process related to the  uterine neoplasm in the distal course of left ureter causing mild obstruction is not excluded.   Gallbladder stone.  Diverticulosis of colon.   Other findings as described in the body of the report.   09/01/2021 Imaging   US pelvis Large central  uterine ill-defined mass approximately 7.7 cm diameter enlarging uterus likely representing the biopsy-proven neoplasm, likely of endometrial origin.   Questionable intramural leiomyoma posterior mid uterus 3.1 cm diameter.   The mass extend and the adnexal regions are poorly characterized but may potentially be better evaluated by MR imaging with and without contrast.   09/10/2021 Surgery   Pre-operative Diagnosis: carcinosarcoma of the uterus, adenopathy on imaging   Post-operative Diagnosis: same, stage IVB carcinosarcoma of the uterus   Operation: Diagnostic laparoscopy    Surgeon: Jeral Pinch MD Operative Findings: On EUA, cervix 2 cm dilated with tumor noted within the os. Cervix otherwise normal appearing. Uterus enlarged, 12 cm, with limited mobility secondary to weight. Compression of the rectum from uterus although no direct invasion appreciated. On intra-abdominal inspection, adhesions noted between the liver and the anterior abdominal wall. Evidence of carcinomatosis involving liver surface, diaphragm, omentum, anterior abdominal wall. Surface of the uterus with obvious tumor infiltration. Small bowel free from uterus. Posteriorly, sigmoid densely adherent to the posterior uterus. Even with manipulator, unable to move the uterus anteriorly. Minimal ascites.    Given findings of stage IV disease and what would require large bowel resection and end ostomy, decision made to abort surgery in favor of neoadjuvant chemotherapy. I had discussed this as a distinct possibility when we had gotten her CT results as well as this morning prior to the surgery.    09/15/2021 Cancer Staging   Staging form: Corpus Uteri - Carcinoma and Carcinosarcoma,  AJCC 8th Edition - Clinical stage from 09/15/2021: FIGO Stage IVB (cT3, cN2a, pM1) - Signed by Heath Lark, MD on 09/15/2021 Stage prefix: Initial diagnosis   09/24/2021 -  Chemotherapy   Patient is on Treatment Plan : UTERINE ENDOMETRIAL Dostarlimab-gxly (500 mg) + Carboplatin (AUC 5) + Paclitaxel (175 mg/m2) q21d x 6 cycles / Dostarlimab-gxly (1000 mg) q42d x 6 cycles        PHYSICAL EXAMINATION: ECOG PERFORMANCE STATUS: 2 - Symptomatic, <50% confined to bed  Vitals:   10/16/21 0915  BP: (!) 144/78  Pulse: 79  Resp: 18  Temp: 98.1 F (36.7 C)  SpO2: 100%   Filed Weights   10/16/21 0915  Weight: 221 lb 14.4 oz (100.7 kg)    GENERAL:alert, no distress and comfortable SKIN: skin color, texture, turgor are normal, no rashes or significant lesions EYES: normal, Conjunctiva are pink and non-injected, sclera clear OROPHARYNX:no exudate, no erythema and lips, buccal mucosa, and tongue normal  NECK: supple, thyroid normal size, non-tender, without nodularity LYMPH:  no palpable lymphadenopathy in the cervical, axillary or inguinal LUNGS: clear to auscultation and percussion with normal breathing effort HEART: regular rate & rhythm and no murmurs with moderate bilateral lower extremity edema ABDOMEN:abdomen soft, non-tender and normal bowel sounds Musculoskeletal:no cyanosis of digits and no clubbing  NEURO: alert & oriented x 3 with fluent speech, no focal motor/sensory deficits  LABORATORY DATA:  I have reviewed the data as listed    Component Value Date/Time   NA 139 10/16/2021 0839   K 3.8 10/16/2021 0839   CL 105 10/16/2021 0839   CO2 28 10/16/2021 0839   GLUCOSE 117 (H) 10/16/2021 0839   BUN 12 10/16/2021 0839   CREATININE 0.70 10/16/2021 0839   CALCIUM 9.3 10/16/2021 0839   PROT 7.2 10/16/2021 0839   ALBUMIN 3.7 10/16/2021 0839   AST 17 10/16/2021 0839   ALT 8 10/16/2021 0839   ALKPHOS 49 10/16/2021 0839   BILITOT 0.4 10/16/2021 0839   GFRNONAA >60  10/16/2021 0839     No results found for: "SPEP", "UPEP"  Lab Results  Component Value Date   WBC 8.4 10/16/2021   NEUTROABS 7.4 10/16/2021   HGB 9.2 (L) 10/16/2021   HCT 29.8 (L) 10/16/2021   MCV 83.5 10/16/2021   PLT 371 10/16/2021      Chemistry      Component Value Date/Time   NA 139 10/16/2021 0839   K 3.8 10/16/2021 0839   CL 105 10/16/2021 0839   CO2 28 10/16/2021 0839   BUN 12 10/16/2021 0839   CREATININE 0.70 10/16/2021 0839      Component Value Date/Time   CALCIUM 9.3 10/16/2021 0839   ALKPHOS 49 10/16/2021 0839   AST 17 10/16/2021 0839   ALT 8 10/16/2021 0839   BILITOT 0.4 10/16/2021 0839       RADIOGRAPHIC STUDIES: I have personally reviewed the radiological images as listed and agreed with the findings in the report. IR Chest Fluoro  Result Date: 10/14/2021 INDICATION: 71 year old female with difficulty accessing port. EXAM: FLUOROSCOPIC GUIDED PORT A CATHETER CHECK MEDICATIONS: None. CONTRAST:  None FLUOROSCOPY TIME:  0 mGy COMPLICATIONS: None immediate. TECHNIQUE: The patient was placed supine on the fluoroscopy table. A preprocedural spot fluoroscopic image was obtained of the chest in existing port a catheter. FINDINGS: The indwelling Port-A-Cath is flipped 180 degrees, with the access site of the reservoir facing posteriorly. There is possible separation of the Port-A-Cath in the catheter at its hub. IMPRESSION: Flipped Port-A-Cath reservoir with possible separation of the catheter hub from the reservoir. PLAN: The patient is being scheduled for Port-A-Cath revision as soon as possible. Ruthann Cancer, MD [facility] Radiology Electronically Signed   By: Ruthann Cancer M.D.   On: 10/14/2021 16:59   IR IMAGING GUIDED PORT INSERTION  Result Date: 09/23/2021 INDICATION: 71 year old female referred for port catheter placement EXAM: IMAGE GUIDED PORT CATHETER PLACEMENT MEDICATIONS: None ANESTHESIA/SEDATION: Moderate (conscious)  sedation was employed during this procedure. A total of Versed 3.0 mg and Fentanyl 100 mcg was administered intravenously. Moderate Sedation Time: 22 minutes. The patient's level of consciousness and vital signs were monitored continuously by radiology nursing throughout the procedure under my direct supervision. FLUOROSCOPY TIME:  Fluoroscopy Time:  (0 mGy). COMPLICATIONS: None immediate. PROCEDURE: The procedure, risks, benefits, and alternatives were explained to the patient. Questions regarding the procedure were encouraged and answered. The patient understands and consents to the procedure. Ultrasound survey was performed with images stored and sent to PACs. Right IJ vein documented to be patent. The right neck and chest was prepped with chlorhexidine, and draped in the usual sterile fashion using maximum barrier technique (cap and mask, sterile gown, sterile gloves, large sterile sheet, hand hygiene and cutaneous antiseptic). Local anesthesia was attained by infiltration with 1% lidocaine without epinephrine. Ultrasound demonstrated patency of the right internal jugular vein, and this was documented with an image. Under real-time ultrasound guidance, this vein was accessed with a 21 gauge micropuncture needle and image documentation was performed. A small dermatotomy was made at the access site with an 11 scalpel. A 0.018" wire was advanced into the SVC and used to estimate the length of the internal catheter. The access needle exchanged for a 44F micropuncture vascular sheath. The 0.018" wire was then removed and a 0.035" wire advanced into the IVC. An appropriate location for the subcutaneous reservoir was selected below the clavicle and an incision was made through the skin and underlying soft tissues. The subcutaneous tissues  were then dissected using a combination of blunt and sharp surgical technique and a pocket was formed. A single lumen power injectable portacatheter was then tunneled through the  subcutaneous tissues from the pocket to the dermatotomy and the port reservoir placed within the subcutaneous pocket. The venous access site was then serially dilated and a peel away vascular sheath placed over the wire. The wire was removed and the port catheter advanced into position under fluoroscopic guidance. The catheter tip is positioned in the cavoatrial junction. This was documented with a spot image. The portacatheter was then tested and found to flush and aspirate well. The port was flushed with saline followed by 100 units/mL heparinized saline. The pocket was then closed in two layers using first subdermal inverted interrupted absorbable sutures followed by a running subcuticular suture. The epidermis was then sealed with Dermabond. The dermatotomy at the venous access site was also seal with Dermabond. Patient tolerated the procedure well and remained hemodynamically stable throughout. No complications encountered and no significant blood loss encountered IMPRESSION: Status post right IJ port catheter placement. Signed, Dulcy Fanny. Nadene Rubins, RPVI [facility] Radiology Electronically Signed   By: Corrie Mckusick D.O.   On: 09/23/2021 11:15

## 2021-10-16 NOTE — Assessment & Plan Note (Signed)
Clinically, she is improving We will proceed with treatment without delay We discussed importance of frequent small meals and avoidance of constipation I plan to repeat imaging study after cycle 3

## 2021-10-16 NOTE — Assessment & Plan Note (Signed)
She is prone to get constipated We discussed importance of regular laxatives 

## 2021-10-16 NOTE — Assessment & Plan Note (Signed)
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 The patient denies recent history of bleeding such as epistaxis, hematuria or hematochezia. She is asymptomatic from the anemia. We will observe for now.  She does not require transfusion now. I do not recommend any further work-up at this time.   

## 2021-10-16 NOTE — Assessment & Plan Note (Signed)
I recommend elastic compression hose, frequent ambulation and leg elevation as much as possible She is retaining fluid I recommend low-dose furosemide for 4 days Her daughter is instructed to monitor her weight carefully

## 2021-10-16 NOTE — Patient Instructions (Addendum)
[facility]  Discharge Instructions: Thank you for choosing [facility] to provide your oncology and hematology care.   If you have a lab appointment with the [facility], please go directly to the [facility] and check in at the registration area.   Wear comfortable clothing and clothing appropriate for easy access to any Portacath or PICC line.   We strive to give you quality time with your provider. You may need to reschedule your appointment if you arrive late (15 or more minutes).  Arriving late affects you and other patients whose appointments are after yours.  Also, if you miss three or more appointments without notifying the office, you may be dismissed from the clinic at the provider's discretion.      For prescription refill requests, have your pharmacy contact our office and allow 72 hours for refills to be completed.    Today you received the following chemotherapy and/or immunotherapy agents: Jempreli, Paclitaxel, and Carboplatin   To help prevent nausea and vomiting after your treatment, we encourage you to take your nausea medication as directed.  BELOW ARE SYMPTOMS THAT SHOULD BE REPORTED IMMEDIATELY: *FEVER GREATER THAN 100.4 F (38 C) OR HIGHER *CHILLS OR SWEATING *NAUSEA AND VOMITING THAT IS NOT CONTROLLED WITH YOUR NAUSEA MEDICATION *UNUSUAL SHORTNESS OF BREATH *UNUSUAL BRUISING OR BLEEDING *URINARY PROBLEMS (pain or burning when urinating, or frequent urination) *BOWEL PROBLEMS (unusual diarrhea, constipation, pain near the anus) TENDERNESS IN MOUTH AND THROAT WITH OR WITHOUT PRESENCE OF ULCERS (sore throat, sores in mouth, or a toothache) UNUSUAL RASH, SWELLING OR PAIN  UNUSUAL VAGINAL DISCHARGE OR ITCHING   Items with * indicate a potential emergency and should be followed up as soon as possible or go to the Emergency Department if any problems should occur.  Please show the CHEMOTHERAPY ALERT CARD or IMMUNOTHERAPY  ALERT CARD at check-in to the Emergency Department and triage nurse.  Should you have questions after your visit or need to cancel or reschedule your appointment, please contact [facility]  Dept: [phone]  and follow the prompts.  Office hours are 8:00 a.m. to 4:30 p.m. Monday - Friday. Please note that voicemails left after 4:00 p.m. may not be returned until the following business day.  We are closed weekends and major holidays. You have access to a nurse at all times for urgent questions. Please call the main number to the clinic Dept: [phone] and follow the prompts.   For any non-urgent questions, you may also contact your provider using MyChart. We now offer e-Visits for anyone 31 and older to request care online for non-urgent symptoms. For details visit [email or web address].   Also download the MyChart app! Go to the app store, search "MyChart", open the app, select [facility], and log in with your MyChart username and password.  Masks are optional in the cancer centers. If you would like for your care team to wear a mask while they are taking care of you, please let them know. You may have one support person who is at least 72 years old accompany you for your appointments. Dostarlimab Injection What is this medication? DOSTARLIMAB (dos tar li mab) treats some types of cancer. It works by helping your immune system slow or stop the spread of cancer cells. It is a monoclonal antibody. This medicine may be used for other purposes; ask your health care provider or pharmacist if you have questions. COMMON BRAND NAME(S): Jemperli What should I  tell my care team before I take this medication? They need to know if you have any of these conditions: Allogeneic stem cell transplant (uses someone else's stem cells) Autoimmune diseases, such as Crohn disease, ulcerative colitis, lupus History of chest radiation Nervous system problems, such as Guillain-Barre  syndrome, myasthenia gravis Organ transplant An unusual or allergic reaction to dostarlimab, other medications, foods, dyes, or preservatives Pregnant or trying to get pregnant Breast-feeding How should I use this medication? This medication is injected into a vein. It is given by your care team in a hospital or clinic setting. A special MedGuide will be given to you before each treatment. Be sure to read this information carefully each time. Talk to your care team about the use of this medication in children. Special care may be needed. Overdosage: If you think you have taken too much of this medicine contact a poison control center or emergency room at once. NOTE: This medicine is only for you. Do not share this medicine with others. What if I miss a dose? Keep appointments for follow-up doses. It is important not to miss your dose. Call your care team if you are unable to keep an appointment. What may interact with this medication? Interactions have not been studied. This list may not describe all possible interactions. Give your health care provider a list of all the medicines, herbs, non-prescription drugs, or dietary supplements you use. Also tell them if you smoke, drink alcohol, or use illegal drugs. Some items may interact with your medicine. What should I watch for while using this medication? Your condition will be monitored carefully while you are receiving this medication. You may need blood work while taking this medication. This medication may cause serious skin reactions. They can happen weeks to months after starting the medication. Contact your care team right away if you notice fevers or flu-like symptoms with a rash. The rash may be red or purple and then turn into blisters or peeling of the skin. You may also notice a red rash with swelling of the face, lips, or lymph nodes in your neck or under your arms. Tell your care team right away if you have any change in your  eyesight. Talk to your care team if you may be pregnant. Serious birth defects can occur if you take this medication during pregnancy and for 4 months after the last dose. You will need a negative pregnancy test before starting this medication. Contraception is recommended while taking this medication and for 4 months after the last dose. Your care team can help you find the option that works for you. Do not breastfeed while taking this medication and for 4 months after the last dose. What side effects may I notice from receiving this medication? Side effects that you should report to your care team as soon as possible: Allergic reactions--skin rash, itching, hives, swelling of the face, lips, tongue, or throat Dry cough, shortness of breath or trouble breathing Eye pain, redness, irritation, or discharge with blurry or decreased vision Heart muscle inflammation--unusual weakness or fatigue, shortness of breath, chest pain, fast or irregular heartbeat, dizziness, swelling of the ankles, feet, or hands Hormone gland problems--headache, sensitivity to light, unusual weakness or fatigue, dizziness, fast or irregular heartbeat, increased sensitivity to cold or heat, excessive sweating, constipation, hair loss, increased thirst or amount of urine, tremors or shaking, irritability Infusion reactions--chest pain, shortness of breath or trouble breathing, feeling faint or lightheaded Kidney injury (glomerulonephritis)--decrease in the amount of urine,  red or dark brown urine, foamy or bubbly urine, swelling of the ankles, hands, or feet Liver injury--right upper belly pain, loss of appetite, nausea, light-colored stool, dark yellow or brown urine, yellowing skin or eyes, unusual weakness or fatigue Pain, tingling, or numbness in the hands or feet, muscle weakness, change in vision, confusion or trouble speaking, loss of balance or coordination, trouble walking, seizures Rash, fever, and swollen lymph  nodes Redness, blistering, peeling, or loosening of the skin, including inside the mouth Sudden or severe stomach pain, bloody diarrhea, fever, nausea, vomiting Side effects that usually do not require medical attention (report these to your care team if they continue or are bothersome): Bone, joint, or muscle pain Diarrhea Fatigue Loss of appetite Nausea Skin rash This list may not describe all possible side effects. Call your doctor for medical advice about side effects. You may report side effects to FDA at 1-800-FDA-1088. Where should I keep my medication? This medication is given in a hospital or clinic. It will not be stored at home. NOTE: This sheet is a summary. It may not cover all possible information. If you have questions about this medicine, talk to your doctor, pharmacist, or health care provider.  2023 Elsevier/Gold Standard (2021-06-10 00:00:00) Paclitaxel Injection What is this medication? PACLITAXEL (PAK li TAX el) treats some types of cancer. It works by slowing down the growth of cancer cells. This medicine may be used for other purposes; ask your health care provider or pharmacist if you have questions. COMMON BRAND NAME(S): Onxol, Taxol What should I tell my care team before I take this medication? They need to know if you have any of these conditions: Heart disease Liver disease Low white blood cell levels An unusual or allergic reaction to paclitaxel, other medications, foods, dyes, or preservatives If you or your partner are pregnant or trying to get pregnant Breast-feeding How should I use this medication? This medication is injected into a vein. It is given by your care team in a hospital or clinic setting. Talk to your care team about the use of this medication in children. While it may be given to children for selected conditions, precautions do apply. Overdosage: If you think you have taken too much of this medicine contact a poison control center or  emergency room at once. NOTE: This medicine is only for you. Do not share this medicine with others. What if I miss a dose? Keep appointments for follow-up doses. It is important not to miss your dose. Call your care team if you are unable to keep an appointment. What may interact with this medication? Do not take this medication with any of the following: Live virus vaccines Other medications may affect the way this medication works. Talk with your care team about all of the medications you take. They may suggest changes to your treatment plan to lower the risk of side effects and to make sure your medications work as intended. This list may not describe all possible interactions. Give your health care provider a list of all the medicines, herbs, non-prescription drugs, or dietary supplements you use. Also tell them if you smoke, drink alcohol, or use illegal drugs. Some items may interact with your medicine. What should I watch for while using this medication? Your condition will be monitored carefully while you are receiving this medication. You may need blood work while taking this medication. This medication may make you feel generally unwell. This is not uncommon as chemotherapy can affect healthy cells as  well as cancer cells. Report any side effects. Continue your course of treatment even though you feel ill unless your care team tells you to stop. This medication can cause serious allergic reactions. To reduce the risk, your care team may give you other medications to take before receiving this one. Be sure to follow the directions from your care team. This medication may increase your risk of getting an infection. Call your care team for advice if you get a fever, chills, sore throat, or other symptoms of a cold or flu. Do not treat yourself. Try to avoid being around people who are sick. This medication may increase your risk to bruise or bleed. Call your care team if you notice any unusual  bleeding. Be careful brushing or flossing your teeth or using a toothpick because you may get an infection or bleed more easily. If you have any dental work done, tell your dentist you are receiving this medication. Talk to your care team if you may be pregnant. Serious birth defects can occur if you take this medication during pregnancy. Talk to your care team before breastfeeding. Changes to your treatment plan may be needed. What side effects may I notice from receiving this medication? Side effects that you should report to your care team as soon as possible: Allergic reactions--skin rash, itching, hives, swelling of the face, lips, tongue, or throat Heart rhythm changes--fast or irregular heartbeat, dizziness, feeling faint or lightheaded, chest pain, trouble breathing Increase in blood pressure Infection--fever, chills, cough, sore throat, wounds that don't heal, pain or trouble when passing urine, general feeling of discomfort or being unwell Low blood pressure--dizziness, feeling faint or lightheaded, blurry vision Low red blood cell level--unusual weakness or fatigue, dizziness, headache, trouble breathing Painful swelling, warmth, or redness of the skin, blisters or sores at the infusion site Pain, tingling, or numbness in the hands or feet Slow heartbeat--dizziness, feeling faint or lightheaded, confusion, trouble breathing, unusual weakness or fatigue Unusual bruising or bleeding Side effects that usually do not require medical attention (report to your care team if they continue or are bothersome): Diarrhea Hair loss Joint pain Loss of appetite Muscle pain Nausea Vomiting This list may not describe all possible side effects. Call your doctor for medical advice about side effects. You may report side effects to FDA at 1-800-FDA-1088. Where should I keep my medication? This medication is given in a hospital or clinic. It will not be stored at home. NOTE: This sheet is a summary.  It may not cover all possible information. If you have questions about this medicine, talk to your doctor, pharmacist, or health care provider.  2023 Elsevier/Gold Standard (2021-06-12 00:00:00) Carboplatin Injection What is this medication? CARBOPLATIN (KAR boe pla tin) treats some types of cancer. It works by slowing down the growth of cancer cells. This medicine may be used for other purposes; ask your health care provider or pharmacist if you have questions. COMMON BRAND NAME(S): Paraplatin What should I tell my care team before I take this medication? They need to know if you have any of these conditions: Blood disorders Hearing problems Kidney disease Recent or ongoing radiation therapy An unusual or allergic reaction to carboplatin, cisplatin, other medications, foods, dyes, or preservatives Pregnant or trying to get pregnant Breast-feeding How should I use this medication? This medication is injected into a vein. It is given by your care team in a hospital or clinic setting. Talk to your care team about the use of this medication in children.  Special care may be needed. Overdosage: If you think you have taken too much of this medicine contact a poison control center or emergency room at once. NOTE: This medicine is only for you. Do not share this medicine with others. What if I miss a dose? Keep appointments for follow-up doses. It is important not to miss your dose. Call your care team if you are unable to keep an appointment. What may interact with this medication? Medications for seizures Some antibiotics, such as amikacin, gentamicin, neomycin, streptomycin, tobramycin Vaccines This list may not describe all possible interactions. Give your health care provider a list of all the medicines, herbs, non-prescription drugs, or dietary supplements you use. Also tell them if you smoke, drink alcohol, or use illegal drugs. Some items may interact with your medicine. What should I  watch for while using this medication? Your condition will be monitored carefully while you are receiving this medication. You may need blood work while taking this medication. This medication may make you feel generally unwell. This is not uncommon, as chemotherapy can affect healthy cells as well as cancer cells. Report any side effects. Continue your course of treatment even though you feel ill unless your care team tells you to stop. In some cases, you may be given additional medications to help with side effects. Follow all directions for their use. This medication may increase your risk of getting an infection. Call your care team for advice if you get a fever, chills, sore throat, or other symptoms of a cold or flu. Do not treat yourself. Try to avoid being around people who are sick. Avoid taking medications that contain aspirin, acetaminophen, ibuprofen, naproxen, or ketoprofen unless instructed by your care team. These medications may hide a fever. Be careful brushing or flossing your teeth or using a toothpick because you may get an infection or bleed more easily. If you have any dental work done, tell your dentist you are receiving this medication. Talk to your care team if you wish to become pregnant or think you might be pregnant. This medication can cause serious birth defects. Talk to your care team about effective forms of contraception. Do not breast-feed while taking this medication. What side effects may I notice from receiving this medication? Side effects that you should report to your care team as soon as possible: Allergic reactions--skin rash, itching, hives, swelling of the face, lips, tongue, or throat Infection--fever, chills, cough, sore throat, wounds that don't heal, pain or trouble when passing urine, general feeling of discomfort or being unwell Low red blood cell level--unusual weakness or fatigue, dizziness, headache, trouble breathing Pain, tingling, or numbness in  the hands or feet, muscle weakness, change in vision, confusion or trouble speaking, loss of balance or coordination, trouble walking, seizures Unusual bruising or bleeding Side effects that usually do not require medical attention (report to your care team if they continue or are bothersome): Hair loss Nausea Unusual weakness or fatigue Vomiting This list may not describe all possible side effects. Call your doctor for medical advice about side effects. You may report side effects to FDA at 1-800-FDA-1088. Where should I keep my medication? This medication is given in a hospital or clinic. It will not be stored at home. NOTE: This sheet is a summary. It may not cover all possible information. If you have questions about this medicine, talk to your doctor, pharmacist, or health care provider.  2023 Elsevier/Gold Standard (2021-05-20 00:00:00)

## 2021-10-17 ENCOUNTER — Other Ambulatory Visit: Payer: Self-pay | Admitting: Hematology and Oncology

## 2021-10-17 ENCOUNTER — Ambulatory Visit (HOSPITAL_COMMUNITY)
Admission: RE | Admit: 2021-10-17 | Discharge: 2021-10-17 | Disposition: A | Discharge status: Home / Self Care | Payer: Medicare Other | Source: Ambulatory Visit | Attending: Hematology and Oncology | Admitting: Hematology and Oncology

## 2021-10-17 ENCOUNTER — Other Ambulatory Visit: Payer: Self-pay

## 2021-10-17 DIAGNOSIS — C801 Malignant (primary) neoplasm, unspecified: Secondary | ICD-10-CM | POA: Diagnosis not present

## 2021-10-17 DIAGNOSIS — T82514A Breakdown (mechanical) of infusion catheter, initial encounter: Secondary | ICD-10-CM | POA: Insufficient documentation

## 2021-10-17 DIAGNOSIS — I10 Essential (primary) hypertension: Secondary | ICD-10-CM | POA: Diagnosis not present

## 2021-10-17 DIAGNOSIS — C549 Malignant neoplasm of corpus uteri, unspecified: Secondary | ICD-10-CM | POA: Diagnosis not present

## 2021-10-17 DIAGNOSIS — Y848 Other medical procedures as the cause of abnormal reaction of the patient, or of later complication, without mention of misadventure at the time of the procedure: Secondary | ICD-10-CM | POA: Insufficient documentation

## 2021-10-17 HISTORY — PX: IR IMAGING GUIDED PORT INSERTION: IMG5740

## 2021-10-17 MED ORDER — LIDOCAINE HCL 1 % IJ SOLN
INTRAMUSCULAR | Status: AC | PRN
  Administered 2021-10-17: 15 mL

## 2021-10-17 MED ORDER — SODIUM CHLORIDE 0.9 % IV SOLN: INTRAVENOUS | Status: DC

## 2021-10-17 MED ORDER — MIDAZOLAM HCL 2 MG/2ML IJ SOLN
INTRAMUSCULAR | Status: AC
  Filled 2021-10-17: qty 4

## 2021-10-17 MED ORDER — LIDOCAINE HCL 1 % IJ SOLN
INTRAMUSCULAR | Status: AC
  Filled 2021-10-17: qty 20

## 2021-10-17 MED ORDER — MIDAZOLAM HCL 2 MG/2ML IJ SOLN
INTRAMUSCULAR | Status: AC | PRN
  Administered 2021-10-17: 1 mg via INTRAVENOUS
  Administered 2021-10-17 (×2): .5 mg via INTRAVENOUS

## 2021-10-17 MED ORDER — FENTANYL CITRATE (PF) 100 MCG/2ML IJ SOLN
INTRAMUSCULAR | Status: AC | PRN
  Administered 2021-10-17 (×2): 25 ug via INTRAVENOUS
  Administered 2021-10-17: 50 ug via INTRAVENOUS

## 2021-10-17 MED ORDER — FENTANYL CITRATE (PF) 100 MCG/2ML IJ SOLN
INTRAMUSCULAR | Status: AC
  Filled 2021-10-17: qty 4

## 2021-10-17 MED ORDER — HEPARIN SOD (PORK) LOCK FLUSH 100 UNIT/ML IV SOLN
INTRAVENOUS | Status: AC
  Filled 2021-10-17: qty 5

## 2021-10-17 NOTE — H&P (Signed)
Chief Complaint: Patient was seen in consultation today for port revision  Referring Physician(s): [place]  Supervising Physician: Corrie Mckusick  Patient Status: [facility] - Out-pt  History of Present Illness: Sarah Wu is a 71 y.o. female with a medical history significant for fibroids, HTN and a recent diagnosis of uterine carcinosarcoma. She underwent port-a-catheter placement in IR 09/23/21. Recently, the oncology team had difficulty accessing the port and on 10/14/21 IR performed a port check. This demonstrated a 180 degree rotation of the port with the access site of the reservoir facing posteriorly. This also showed a possible separation of the port catheter at the hub.   The patient presents today for a port revision. She is accompanied by her daughter and is feeling well. She denies any pain or discomfort.   Past Medical History:  Diagnosis Date   Arthritis    Fibroid tumor    Hx of mammogram 2022   [place]   Hypercholesteremia    Hypertension    Post-menopausal bleeding    Pre-diabetes     Past Surgical History:  Procedure Laterality Date   EXAMINATION UNDER ANESTHESIA  08/11/2021   with cervical mass biopsies   IR CHEST FLUORO  10/14/2021   IR IMAGING GUIDED PORT INSERTION  09/23/2021   LAPAROSCOPY N/A 09/10/2021   Procedure: DIAGNOSTIC LAPAROSCOPY;  Surgeon: Lafonda Mosses, MD;  Location: [facility] ORS;  Service: Gynecology;  Laterality: N/A;   REPLACEMENT TOTAL KNEE BILATERAL     TUBAL LIGATION      Allergies: Hydrocodone-acetaminophen and Oxycodone-acetaminophen  Medications: Prior to Admission medications   Medication Sig Start Date End Date Taking? Authorizing Provider  allopurinol (ZYLOPRIM) 300 MG tablet Take 300 mg by mouth daily.   Yes [provider]  amLODipine (NORVASC) 5 MG tablet Take 5 mg by mouth daily.   Yes [provider]  atenolol (TENORMIN) 50 MG tablet Take 50 mg by mouth daily.   Yes [provider]   atorvastatin (LIPITOR) 10 MG tablet Take 10 mg by mouth at bedtime.   Yes [provider]  cholecalciferol (VITAMIN D3) 25 MCG (1000 UNIT) tablet Take 1,000 Units by mouth daily.   Yes [provider]  dexamethasone (DECADRON) 4 MG tablet Take 2 tablets by mouth the night before and 2 tablets the morning of chemotherapy, every 3 weeks, for 6 cycles 10/16/21  Yes Gorsuch, Ni, MD  ferrous sulfate 325 (65 FE) MG tablet Take 325 mg by mouth daily.   Yes [provider]  lidocaine-prilocaine (EMLA) cream Apply to affected area once 09/17/21  Yes Gorsuch, Ni, MD  meloxicam (MOBIC) 7.5 MG tablet Take 7.5 mg by mouth daily.   Yes [provider]  Multiple Vitamins-Minerals (ADULT GUMMY) CHEW Chew 2 capsules by mouth daily.   Yes [provider]  polyethylene glycol (MIRALAX / GLYCOLAX) 17 g packet Take 17 g by mouth 2 (two) times daily.   Yes [provider]  senna-docusate (SENOKOT-S) 8.6-50 MG tablet Take 2 tablets by mouth 2 (two) times daily. 10/02/21  Yes Heath Lark, MD  traMADol (ULTRAM) 50 MG tablet Take 2 tablets (100 mg total) by mouth every 6 (six) hours as needed for severe pain. 09/26/21  Yes Gorsuch, Ni, MD  furosemide (LASIX) 20 MG tablet Take 1 tablet (20 mg total) by mouth daily. 10/16/21   Heath Lark, MD  ondansetron (ZOFRAN) 8 MG tablet Take 1 tablet by mouth every 8 hours as needed for nausea or vomiting. Start on the third day  after chemotherapy. 09/17/21   Heath Lark, MD  prochlorperazine (COMPAZINE) 10 MG tablet Take 1 tablet (10 mg total) by mouth every 6 (six) hours as needed for nausea or vomiting. 09/17/21   Heath Lark, MD     Family History  Problem Relation Age of Onset   Breast cancer Sister        half-sister    Social History   Socioeconomic History   Marital status: Single    Spouse name: Not on file   Number of children: Not on file   Years of education: Not on file   Highest education level: Not on file   Occupational History   Not on file  Tobacco Use   Smoking status: Never    Passive exposure: Never   Smokeless tobacco: Never  Vaping Use   Vaping Use: Never used  Substance and Sexual Activity   Alcohol use: Never   Drug use: Never   Sexual activity: Not Currently  Other Topics Concern   Not on file  Social History Narrative   Not on file   Social Determinants of Health   Financial Resource Strain: Not on file  Food Insecurity: Food Insecurity Present (09/24/2021)   Hunger Vital Sign    Worried About Running Out of Food in the Last Year: Sometimes true    Ran Out of Food in the Last Year: Not on file  Transportation Needs: No Transportation Needs (09/24/2021)   PRAPARE - Hydrologist (Medical): No    Lack of Transportation (Non-Medical): No  Physical Activity: Not on file  Stress: Not on file  Social Connections: Not on file    Review of Systems: A 12 point ROS discussed and pertinent positives are indicated in the HPI above.  All other systems are negative.  Review of Systems  Constitutional:  Negative for appetite change and fatigue.  Respiratory:  Negative for cough and shortness of breath.   Cardiovascular:  Negative for chest pain and leg swelling.  Gastrointestinal:  Negative for abdominal pain, diarrhea, nausea and vomiting.  Neurological:  Negative for dizziness and headaches.    Vital Signs: BP 129/84   Pulse 82   Temp 97.8 F (36.6 C) (Temporal)   Resp 18   Ht '5\' 8"'$  (1.727 m)   Wt 219 lb (99.3 kg)   SpO2 99%   BMI 33.30 kg/m   Physical Exam Constitutional:      General: She is not in acute distress.    Appearance: She is not ill-appearing.  HENT:     Mouth/Throat:     Mouth: Mucous membranes are moist.     Pharynx: Oropharynx is clear.  Cardiovascular:     Rate and Rhythm: Normal rate and regular rhythm.     Pulses: Normal pulses.     Heart sounds: Normal heart sounds.     Comments: Right IJ port-a-catheter site  with a well-healed incision. Site is not accessed and is unremarkable in appearance. There are two steri-strips at the puncture site above the collar bone. The patient's daughter placed these after the procedure and they have not yet fallen off.  Pulmonary:     Effort: Pulmonary effort is normal.     Breath sounds: Normal breath sounds.  Abdominal:     General: Bowel sounds are normal.     Palpations: Abdomen is soft.  Skin:    General: Skin is warm and dry.  Neurological:     Mental Status: She is alert  and oriented to person, place, and time.     Imaging: IR Chest Fluoro  Result Date: 10/14/2021 INDICATION: 71 year old female with difficulty accessing port. EXAM: FLUOROSCOPIC GUIDED PORT A CATHETER CHECK MEDICATIONS: None. CONTRAST:  None FLUOROSCOPY TIME:  0 mGy COMPLICATIONS: None immediate. TECHNIQUE: The patient was placed supine on the fluoroscopy table. A preprocedural spot fluoroscopic image was obtained of the chest in existing port a catheter. FINDINGS: The indwelling Port-A-Cath is flipped 180 degrees, with the access site of the reservoir facing posteriorly. There is possible separation of the Port-A-Cath in the catheter at its hub. IMPRESSION: Flipped Port-A-Cath reservoir with possible separation of the catheter hub from the reservoir. PLAN: The patient is being scheduled for Port-A-Cath revision as soon as possible. Ruthann Cancer, MD [facility] Radiology Electronically Signed   By: Ruthann Cancer M.D.   On: 10/14/2021 16:59   IR IMAGING GUIDED PORT INSERTION  Result Date: 09/23/2021 INDICATION: 71 year old female referred for port catheter placement EXAM: IMAGE GUIDED PORT CATHETER PLACEMENT MEDICATIONS: None ANESTHESIA/SEDATION: Moderate (conscious) sedation was employed during this procedure. A total of Versed 3.0 mg and Fentanyl 100 mcg was administered intravenously. Moderate Sedation Time: 22 minutes. The patient's level of  consciousness and vital signs were monitored continuously by radiology nursing throughout the procedure under my direct supervision. FLUOROSCOPY TIME:  Fluoroscopy Time:  (0 mGy). COMPLICATIONS: None immediate. PROCEDURE: The procedure, risks, benefits, and alternatives were explained to the patient. Questions regarding the procedure were encouraged and answered. The patient understands and consents to the procedure. Ultrasound survey was performed with images stored and sent to PACs. Right IJ vein documented to be patent. The right neck and chest was prepped with chlorhexidine, and draped in the usual sterile fashion using maximum barrier technique (cap and mask, sterile gown, sterile gloves, large sterile sheet, hand hygiene and cutaneous antiseptic). Local anesthesia was attained by infiltration with 1% lidocaine without epinephrine. Ultrasound demonstrated patency of the right internal jugular vein, and this was documented with an image. Under real-time ultrasound guidance, this vein was accessed with a 21 gauge micropuncture needle and image documentation was performed. A small dermatotomy was made at the access site with an 11 scalpel. A 0.018" wire was advanced into the SVC and used to estimate the length of the internal catheter. The access needle exchanged for a 26F micropuncture vascular sheath. The 0.018" wire was then removed and a 0.035" wire advanced into the IVC. An appropriate location for the subcutaneous reservoir was selected below the clavicle and an incision was made through the skin and underlying soft tissues. The subcutaneous tissues were then dissected using a combination of blunt and sharp surgical technique and a pocket was formed. A single lumen power injectable portacatheter was then tunneled through the subcutaneous tissues from the pocket to the dermatotomy and the port reservoir placed within the subcutaneous pocket. The venous access site was then serially dilated and a peel away  vascular sheath placed over the wire. The wire was removed and the port catheter advanced into position under fluoroscopic guidance. The catheter tip is positioned in the cavoatrial junction. This was documented with a spot image. The portacatheter was then tested and found to flush and aspirate well. The port was flushed with saline followed by 100 units/mL heparinized saline. The pocket was then closed in two layers using first subdermal inverted interrupted absorbable sutures followed by a running subcuticular suture. The epidermis was then sealed with Dermabond. The dermatotomy at the venous access  site was also seal with Dermabond. Patient tolerated the procedure well and remained hemodynamically stable throughout. No complications encountered and no significant blood loss encountered IMPRESSION: Status post right IJ port catheter placement. Signed, Dulcy Fanny. Nadene Rubins, RPVI [facility] Radiology Electronically Signed   By: Corrie Mckusick D.O.   On: 09/23/2021 11:15    Labs:  CBC: Recent Labs    09/23/21 0716 10/02/21 1227 10/06/21 0927 10/16/21 0839  WBC 5.6 1.1* 6.4 8.4  HGB 9.4* 8.1* 7.6* 9.2*  HCT 31.5* 25.8* 24.3* 29.8*  PLT 323 170 209 371    COAGS: No results for input(s): "INR", "APTT" in the last 8760 hours.  BMP: Recent Labs    09/01/21 1137 09/23/21 0716 10/02/21 1227 10/16/21 0839  NA 137 140 136 139  K 4.3 4.0 3.8 3.8  CL 102 107 100 105  CO2 '26 23 29 28  '$ GLUCOSE 104* 157* 105* 117*  BUN '10 18 9 12  '$ CALCIUM 9.3 9.4 9.3 9.3  CREATININE 1.01* 0.80 0.56 0.70  GFRNONAA 59* >60 >60 >60    LIVER FUNCTION TESTS: Recent Labs    09/01/21 1137 09/23/21 0716 10/02/21 1227 10/16/21 0839  BILITOT 0.7 0.4 0.8 0.4  AST '19 20 20 17  '$ ALT '10 12 15 8  '$ ALKPHOS 59 50 48 49  PROT 7.9 7.6 6.8 7.2  ALBUMIN 3.2* 3.6 3.7 3.7    TUMOR MARKERS: No results for input(s): "AFPTM", "CEA", "CA199", "CHROMGRNA" in the last  8760 hours.  Assessment and Plan:  Uterine Carcinosarcoma with port-a-catheter in place; port-a-catheter now malrotated and inaccessible/non-functioning: Annamarie Major, 71 year old female, presents today to the [facility] department for an image-guided port-a-catheter revision.  Risks and benefits of image-guided Port-a-catheter revision were discussed with the patient including, but not limited to bleeding, infection, pneumothorax, or fibrin sheath development and need for additional procedures.  All of the patient's questions were answered, patient is agreeable to proceed. She has been NPO.   Consent signed and in chart.  Thank you for this interesting consult.  I greatly enjoyed meeting Sarah Wu and look forward to participating in their care.  A copy of this report was sent to the requesting provider on this date.  Electronically Signed: Veria Stradley, AGACNP-BC [phone] 10/17/2021, 10:12 AM   I spent a total of  30 Minutes   in face to face in clinical consultation, greater than 50% of which was counseling/coordinating care for port repair/replacement

## 2021-10-17 NOTE — Procedures (Signed)
Interventional Radiology Procedure Note  Procedure:  Revision of right IJ port, non-functional.  Replacement of a right IJ approach single lumen PowerPort.  Tip is positioned at the superior cavoatrial junction and catheter is ready for immediate use.  Findings: previous was rotated 180degrees. New port secured with stay suture.  Complications: None  Recommendations:  - 30-60 minute recovery - OK to use - Ok to shower tomorrow - Do not submerge for 7 days - Routine line care   Signed,  Dulcy Fanny. Earleen Newport, DO

## 2021-10-20 ENCOUNTER — Other Ambulatory Visit: Payer: Self-pay | Admitting: Hematology and Oncology

## 2021-10-20 ENCOUNTER — Other Ambulatory Visit (HOSPITAL_COMMUNITY): Payer: Self-pay

## 2021-10-20 ENCOUNTER — Other Ambulatory Visit: Payer: Self-pay

## 2021-10-21 ENCOUNTER — Other Ambulatory Visit (HOSPITAL_COMMUNITY): Payer: Self-pay

## 2021-10-22 ENCOUNTER — Other Ambulatory Visit (HOSPITAL_COMMUNITY): Payer: Self-pay

## 2021-10-23 ENCOUNTER — Telehealth: Payer: Self-pay | Admitting: Radiology

## 2021-10-23 ENCOUNTER — Telehealth: Payer: Self-pay | Admitting: *Deleted

## 2021-10-23 NOTE — Telephone Encounter (Signed)
"  Samira Acero daughter Heron Sabins [phone]) calling to verify paperwork filled out for renewal of leave?"  Confirmed receipt 10/14/2021 of [employer] leave form in queue to process.  We ask to allow 7-10 business days (14-calendar) to process.  A signed [place] release is required.  Offered to e-mail form as patient lives [place]-hours away.  Confirmed 10/23/2021 start date for intermittent leave.  Apologized for inconvenience.  Daughter's voice elevated during conversation.  "When in office I asked if she needed to sign and was told no because I already have access to her health care.  No, I am not her power of attorney.  Why does one need to be signed when one was signed in July?  On my lunch break now so e-mail to me to go downtown to complete this because I need the forms returned.  Planning for continuous leave when she has surgery."    Searched [employer], patient files and documents.   No form or ROI located for this daughter.  Connected with GYN who report form release was signed but not a [facility] release.  July form not available currently for this nurse to use

## 2021-10-23 NOTE — Telephone Encounter (Signed)
A Randomized pragmatic Chair-Based Home Exercise Intervention for Mitigating Cancer-Related Fatigue in Older Adults Undergoing Chemotherapy for Advanced Disease   10/23/21  WEEK 3 PHONE CALL: Confirmed I was speaking with Sarah Wu . Informed patient reason for call is to check in on how the exercise program was going. She stated she continues to do the exercises and the difficulty and tiredness is similar to last call. Encouraged patient to continue doing exercises and let her know we would coordinator a 6 week visit at next follow-up phone call. Patient expressed understanding. Thanked patient for her continued support of the above mentioned study and look forward to speaking with her again soon. Next follow-up will be in 2 weeks for 5 week phone call.   Carol Ada, RT(R)(T) Clinical Research Coordinator

## 2021-10-28 ENCOUNTER — Other Ambulatory Visit (HOSPITAL_COMMUNITY): Payer: Self-pay

## 2021-10-28 ENCOUNTER — Telehealth: Payer: Self-pay

## 2021-10-28 NOTE — Telephone Encounter (Signed)
Called patient to schedule appointment for either 10/17 or 10/20.  Patients mailbox was full and couldn't leave a message.

## 2021-10-29 ENCOUNTER — Other Ambulatory Visit (HOSPITAL_COMMUNITY): Payer: Self-pay

## 2021-10-30 ENCOUNTER — Encounter: Payer: Self-pay | Admitting: Hematology and Oncology

## 2021-10-30 NOTE — Telephone Encounter (Signed)
Spoke with patient's daughter Heath Lark.  Setup appointment for 10/17 at 3:00pm.  Patient confirmed and understood.

## 2021-10-31 ENCOUNTER — Other Ambulatory Visit: Payer: Self-pay

## 2021-11-05 MED FILL — Dexamethasone Sodium Phosphate Inj 100 MG/10ML: INTRAMUSCULAR | Qty: 1 | Status: AC

## 2021-11-05 MED FILL — Fosaprepitant Dimeglumine For IV Infusion 150 MG (Base Eq): INTRAVENOUS | Qty: 5 | Status: AC

## 2021-11-06 ENCOUNTER — Encounter: Payer: Self-pay | Admitting: Hematology and Oncology

## 2021-11-06 ENCOUNTER — Inpatient Hospital Stay: Payer: Medicare Other

## 2021-11-06 ENCOUNTER — Inpatient Hospital Stay: Payer: Medicare Other | Admitting: Hematology and Oncology

## 2021-11-06 ENCOUNTER — Other Ambulatory Visit: Payer: Self-pay

## 2021-11-06 ENCOUNTER — Encounter: Payer: Self-pay | Admitting: Radiology

## 2021-11-06 DIAGNOSIS — C549 Malignant neoplasm of corpus uteri, unspecified: Secondary | ICD-10-CM

## 2021-11-06 DIAGNOSIS — Z5111 Encounter for antineoplastic chemotherapy: Secondary | ICD-10-CM | POA: Diagnosis not present

## 2021-11-06 DIAGNOSIS — R600 Localized edema: Secondary | ICD-10-CM

## 2021-11-06 DIAGNOSIS — I10 Essential (primary) hypertension: Secondary | ICD-10-CM | POA: Diagnosis not present

## 2021-11-06 DIAGNOSIS — D638 Anemia in other chronic diseases classified elsewhere: Secondary | ICD-10-CM | POA: Diagnosis not present

## 2021-11-06 LAB — CBC WITH DIFFERENTIAL (CANCER CENTER ONLY)
Abs Immature Granulocytes: 0.03 10*3/uL (ref 0.00–0.07)
Basophils Absolute: 0 10*3/uL (ref 0.0–0.1)
Basophils Relative: 0 %
Eosinophils Absolute: 0 10*3/uL (ref 0.0–0.5)
Eosinophils Relative: 0 %
HCT: 31.1 % — ABNORMAL LOW (ref 36.0–46.0)
Hemoglobin: 9.7 g/dL — ABNORMAL LOW (ref 12.0–15.0)
Immature Granulocytes: 0 %
Lymphocytes Relative: 12 %
Lymphs Abs: 0.8 10*3/uL (ref 0.7–4.0)
MCH: 25.9 pg — ABNORMAL LOW (ref 26.0–34.0)
MCHC: 31.2 g/dL (ref 30.0–36.0)
MCV: 83.2 fL (ref 80.0–100.0)
Monocytes Absolute: 0.1 10*3/uL (ref 0.1–1.0)
Monocytes Relative: 1 %
Neutro Abs: 6.3 10*3/uL (ref 1.7–7.7)
Neutrophils Relative %: 87 %
Platelet Count: 266 10*3/uL (ref 150–400)
RBC: 3.74 MIL/uL — ABNORMAL LOW (ref 3.87–5.11)
RDW: 19.6 % — ABNORMAL HIGH (ref 11.5–15.5)
WBC Count: 7.3 10*3/uL (ref 4.0–10.5)
nRBC: 0 % (ref 0.0–0.2)

## 2021-11-06 LAB — CMP (CANCER CENTER ONLY)
ALT: 7 U/L (ref 0–44)
AST: 16 U/L (ref 15–41)
Albumin: 4 g/dL (ref 3.5–5.0)
Alkaline Phosphatase: 55 U/L (ref 38–126)
Anion gap: 12 (ref 5–15)
BUN: 12 mg/dL (ref 8–23)
CO2: 28 mmol/L (ref 22–32)
Calcium: 9.4 mg/dL (ref 8.9–10.3)
Chloride: 100 mmol/L (ref 98–111)
Creatinine: 0.56 mg/dL (ref 0.44–1.00)
GFR, Estimated: >60 mL/min (ref >60)
Glucose, Bld: 150 mg/dL — ABNORMAL HIGH (ref 70–99)
Potassium: 3.5 mmol/L (ref 3.5–5.1)
Sodium: 140 mmol/L (ref 135–145)
Total Bilirubin: 0.5 mg/dL (ref 0.3–1.2)
Total Protein: 7.5 g/dL (ref 6.5–8.1)

## 2021-11-06 LAB — TSH: TSH: 0.27 u[IU]/mL — ABNORMAL LOW (ref 0.350–4.500)

## 2021-11-06 MED ORDER — SODIUM CHLORIDE 0.9 % IV SOLN
10.0000 mg | Freq: Once | INTRAVENOUS | Status: AC
  Administered 2021-11-06: 10 mg via INTRAVENOUS
  Filled 2021-11-06: qty 10

## 2021-11-06 MED ORDER — FAMOTIDINE IN NACL 20-0.9 MG/50ML-% IV SOLN
20.0000 mg | Freq: Once | INTRAVENOUS | Status: AC
  Administered 2021-11-06: 20 mg via INTRAVENOUS
  Filled 2021-11-06: qty 50

## 2021-11-06 MED ORDER — SODIUM CHLORIDE 0.9 % IV SOLN
500.0000 mg | Freq: Once | INTRAVENOUS | Status: AC
  Administered 2021-11-06: 500 mg via INTRAVENOUS
  Filled 2021-11-06: qty 10

## 2021-11-06 MED ORDER — HEPARIN SOD (PORK) LOCK FLUSH 100 UNIT/ML IV SOLN
500.0000 [IU] | Freq: Once | INTRAVENOUS | Status: AC | PRN
  Administered 2021-11-06: 500 [IU]

## 2021-11-06 MED ORDER — CETIRIZINE HCL 10 MG/ML IV SOLN
10.0000 mg | Freq: Once | INTRAVENOUS | Status: AC
  Administered 2021-11-06: 10 mg via INTRAVENOUS
  Filled 2021-11-06: qty 1

## 2021-11-06 MED ORDER — SODIUM CHLORIDE 0.9 % IV SOLN
175.0000 mg/m2 | Freq: Once | INTRAVENOUS | Status: AC
  Administered 2021-11-06: 378 mg via INTRAVENOUS
  Filled 2021-11-06: qty 63

## 2021-11-06 MED ORDER — SODIUM CHLORIDE 0.9 % IV SOLN
150.0000 mg | Freq: Once | INTRAVENOUS | Status: AC
  Administered 2021-11-06: 150 mg via INTRAVENOUS
  Filled 2021-11-06: qty 150

## 2021-11-06 MED ORDER — SODIUM CHLORIDE 0.9% FLUSH
10.0000 mL | INTRAVENOUS | Status: DC | PRN
  Administered 2021-11-06: 10 mL

## 2021-11-06 MED ORDER — PALONOSETRON HCL INJECTION 0.25 MG/5ML
0.2500 mg | Freq: Once | INTRAVENOUS | Status: AC
  Administered 2021-11-06: 0.25 mg via INTRAVENOUS
  Filled 2021-11-06: qty 5

## 2021-11-06 MED ORDER — SODIUM CHLORIDE 0.9 % IV SOLN: Freq: Once | INTRAVENOUS | Status: AC

## 2021-11-06 MED ORDER — SODIUM CHLORIDE 0.9% FLUSH
10.0000 mL | Freq: Once | INTRAVENOUS | Status: AC
  Administered 2021-11-06: 10 mL

## 2021-11-06 MED ORDER — SODIUM CHLORIDE 0.9 % IV SOLN
517.5000 mg | Freq: Once | INTRAVENOUS | Status: AC
  Administered 2021-11-06: 520 mg via INTRAVENOUS
  Filled 2021-11-06: qty 52

## 2021-11-06 NOTE — Patient Instructions (Signed)
[facility]COL[facility]  Discharge Instructions: Thank you for choosing [facility] to provide your oncology and hematology care.   If you have a lab appointment with the [facility], please go directly to the [facility] and check in at the registration area.   Wear comfortable clothing and clothing appropriate for easy access to any Portacath or PICC line.   We strive to give you quality time with your provider. You may need to reschedule your appointment if you arrive late (15 or more minutes).  Arriving late affects you and other patients whose appointments are after yours.  Also, if you miss three or more appointments without notifying the office, you may be dismissed from the clinic at the provider's discretion.      For prescription refill requests, have your pharmacy contact our office and allow 72 hours for refills to be completed.    Today you received the following chemotherapy and/or immunotherapy agents; Carboplatin, Paclitaxel, & Jemperli      To help prevent nausea and vomiting after your treatment, we encourage you to take your nausea medication as directed.  BELOW ARE SYMPTOMS THAT SHOULD BE REPORTED IMMEDIATELY: *FEVER GREATER THAN 100.4 F (38 C) OR HIGHER *CHILLS OR SWEATING *NAUSEA AND VOMITING THAT IS NOT CONTROLLED WITH YOUR NAUSEA MEDICATION *UNUSUAL SHORTNESS OF BREATH *UNUSUAL BRUISING OR BLEEDING *URINARY PROBLEMS (pain or burning when urinating, or frequent urination) *BOWEL PROBLEMS (unusual diarrhea, constipation, pain near the anus) TENDERNESS IN MOUTH AND THROAT WITH OR WITHOUT PRESENCE OF ULCERS (sore throat, sores in mouth, or a toothache) UNUSUAL RASH, SWELLING OR PAIN  UNUSUAL VAGINAL DISCHARGE OR ITCHING   Items with * indicate a potential emergency and should be followed up as soon as possible or go to the Emergency Department if any problems should occur.  Please show the CHEMOTHERAPY ALERT CARD or IMMUNOTHERAPY  ALERT CARD at check-in to the Emergency Department and triage nurse.  Should you have questions after your visit or need to cancel or reschedule your appointment, please contact [facility]  Dept: [phone]  and follow the prompts.  Office hours are 8:00 a.m. to 4:30 p.m. Monday - Friday. Please note that voicemails left after 4:00 p.m. may not be returned until the following business day.  We are closed weekends and major holidays. You have access to a nurse at all times for urgent questions. Please call the main number to the clinic Dept: [phone] and follow the prompts.   For any non-urgent questions, you may also contact your provider using MyChart. We now offer e-Visits for anyone 49 and older to request care online for non-urgent symptoms. For details visit [email or web address].   Also download the MyChart app! Go to the app store, search "MyChart", open the app, select [facility], and log in with your MyChart username and password.  Masks are optional in the cancer centers. If you would like for your care team to wear a mask while they are taking care of you, please let them know. You may have one support Tarron Krolak who is at least 72 years old accompany you for your appointments.

## 2021-11-06 NOTE — Assessment & Plan Note (Signed)
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 The patient denies recent history of bleeding such as epistaxis, hematuria or hematochezia. She is asymptomatic from the anemia. We will observe for now.  She does not require transfusion now. I do not recommend any further work-up at this time.   

## 2021-11-06 NOTE — Research (Signed)
A Randomized pragmatic Chair-Based Home Exercise Intervention for Mitigating Cancer-Related Fatigue in Older Adults Undergoing Chemotherapy for Advanced Disease   11/06/2021  WEEK 5: Met with patient during infusion. Patient states she is continuing her exercises and is doing well overall. Encouraged patient to continue exercises and will remind patient prior to week 6 visit to bring back completed diaries. Thanked patient for her time and continued support of the above mentioned study.   Week 6 will occur 10/17 due to scheduling.   Carol Ada, RT(R)(T) Clinical Research Coordinator

## 2021-11-06 NOTE — Progress Notes (Signed)
[facility] OFFICE PROGRESS NOTE  Patient Care Team: [name], [name], DO as PCP - General (Family Medicine)  ASSESSMENT & PLAN:  Uterine cancer ([place]) Clinically, she is improving We will proceed with treatment without delay We discussed importance of frequent small meals and avoidance of constipation I plan to repeat imaging study after cycle 3  Anemia, chronic disease This is likely anemia of chronic disease. The patient denies recent history of bleeding such as epistaxis, hematuria or hematochezia. She is asymptomatic from the anemia. We will observe for now.  She does not require transfusion now. I do not recommend any further work-up at this time.    Bilateral edema of lower extremity I recommend elastic compression hose, frequent ambulation and leg elevation as much as possible She is retaining fluid I recommend low-dose furosemide Her daughter is instructed to monitor her weight carefully  Essential hypertension Her blood pressure is intermittently elevated She will continue current prescribed medications  I emphasized importance of hydration  Orders Placed This Encounter  Procedures   CT CHEST ABDOMEN PELVIS W CONTRAST    Standing Status:   Future    Standing Expiration Date:   11/07/2022    Order Specific Question:   Preferred imaging location?    Answer:   [facility]    Order Specific Question:   Radiology Contrast Protocol - do NOT remove file path    Answer:   [email or web address]    All questions were answered. The patient knows to call the clinic with any problems, questions or concerns. The total time spent in the appointment was 30 minutes encounter with patients including review of chart and various tests results, discussions about plan of care and coordination of care plan   Heath Lark, MD 11/06/2021 5:57 PM  INTERVAL HISTORY: Please see below for problem oriented charting. she returns for  treatment follow-up with her daughter She has no further vaginal bleeding She is eating better and attempt to ambulate around the house She had difficulties with mobility due to knee pain I reviewed her documented oral intake with flowsheet which appears that she is eating better and hydrating adequately She gets better control on her constipation with regular laxatives  REVIEW OF SYSTEMS:   Constitutional: Denies fevers, chills or abnormal weight loss Eyes: Denies blurriness of vision Ears, nose, mouth, throat, and face: Denies mucositis or sore throat Respiratory: Denies cough, dyspnea or wheezes Cardiovascular: Denies palpitation, chest discomfort  Gastrointestinal:  Denies nausea, heartburn or change in bowel habits Skin: Denies abnormal skin rashes Lymphatics: Denies new lymphadenopathy or easy bruising Neurological:Denies numbness, tingling or new weaknesses Behavioral/Psych: Mood is stable, no new changes  All other systems were reviewed with the patient and are negative.  I have reviewed the past medical history, past surgical history, social history and family history with the patient and they are unchanged from previous note.  ALLERGIES:  is allergic to hydrocodone-acetaminophen and oxycodone-acetaminophen.  MEDICATIONS:  Current Outpatient Medications  Medication Sig Dispense Refill   allopurinol (ZYLOPRIM) 300 MG tablet Take 300 mg by mouth daily.     amLODipine (NORVASC) 5 MG tablet Take 5 mg by mouth daily.     atenolol (TENORMIN) 50 MG tablet Take 50 mg by mouth daily.     atorvastatin (LIPITOR) 10 MG tablet Take 10 mg by mouth at bedtime.     cholecalciferol (VITAMIN D3) 25 MCG (1000 UNIT) tablet Take 1,000 Units by mouth daily.     dexamethasone (DECADRON) 4 MG  tablet Take 2 tablets by mouth the night before and 2 tablets the morning of chemotherapy, every 3 weeks, for 6 cycles 36 tablet 6   ferrous sulfate 325 (65 FE) MG tablet Take 325 mg by mouth daily.      furosemide (LASIX) 20 MG tablet Take 1 tablet (20 mg total) by mouth daily. 30 tablet 0   lidocaine-prilocaine (EMLA) cream Apply to affected area once 30 g 3   meloxicam (MOBIC) 7.5 MG tablet Take 7.5 mg by mouth daily.     Multiple Vitamins-Minerals (ADULT GUMMY) CHEW Chew 2 capsules by mouth daily.     ondansetron (ZOFRAN) 8 MG tablet Take 1 tablet by mouth every 8 hours as needed for nausea or vomiting. Start on the third day after chemotherapy. 30 tablet 1   polyethylene glycol (MIRALAX / GLYCOLAX) 17 g packet Take 17 g by mouth 2 (two) times daily.     prochlorperazine (COMPAZINE) 10 MG tablet Take 1 tablet (10 mg total) by mouth every 6 (six) hours as needed for nausea or vomiting. 30 tablet 1   senna-docusate (SENOKOT-S) 8.6-50 MG tablet Take 2 tablets by mouth 2 (two) times daily. 30 tablet 0   traMADol (ULTRAM) 50 MG tablet Take 2 tablets (100 mg total) by mouth every 6 (six) hours as needed for severe pain. 60 tablet 0   No current facility-administered medications for this visit.   Facility-Administered Medications Ordered in Other Visits  Medication Dose Route Frequency Provider Last Rate Last Admin   sodium chloride flush (NS) 0.9 % injection 10 mL  10 mL Intracatheter PRN Alvy Bimler, Dorie Ohms, MD   10 mL at 11/06/21 1527    SUMMARY OF ONCOLOGIC HISTORY: Oncology History Overview Note  Outside path showed carcinosarcoma, PD-L1 positive, MSI stable   Uterine cancer ([place])  07/30/2021 Initial Diagnosis   She was evaluated for post-menopausal bleeding. She reported having been seen a year previously and having a biopsy that was negative.  On exam in clinic, she was noted to have a mass present within the vagina.  This was thought to be a prolapsing fibroid and plan was made for exam under anesthesia, removal prolapsing fibroid with hysteroscopy and D&C   08/11/2021 Pathology Results   PD-L1 [phone]):                                             Does meet FDA-approval, Combined Positive Score  (CPS): >=1   Pan-TRK IHC:                                              Tumor cells are Focally Positive for TRK protein expression.   MICROSATELLITE INSTABILITY:             MS-Stable   TUMOR MUTATION BURDEN:                10.8 Muts/Mb TMB-Low   OTHER BIOMARKERS:                              MET amplification PIK3CA amplification MDM2 amplification CCNE1 amplification   Pertinent Negative Biomarkers Evaluated by NGS: ALK ATM BARD1 BRAF BRCA1 BRCA2 CDK12 CHECK1 CHEK2 EGFR  ERBB2 ESR1 FANCL FGFR2  FGFR3 IDH1 IDH2 KIT KRAS NRAS NTRK1 NTRK2 NTRK3 PALB2 PDGFRA RAD51B RAD51C  RAD51D RAD54L RET ROS1   Comment:  The above studies are performed and reported by [facility], [place]) as part of the [facility] (NGS) panel.  Pertinent results are summarized above.  Please see their separate reports for more detailed information  Addendum electronically signed by Elsworth Soho, MD on 09/03/2021 at  7:37 AM  Final Diagnosis    A.  CERVICAL MASS, EXCISION:       - CONSISTENT WITH CARCINOSARCOMA (HIGH GRADE MALIGNANT BIPHASIC TUMOR)  Electronically signed by Elsworth Soho, MD on 08/15/2021 at  9:53 AM  Comment    The malignant epithelial component exhibits high-grade cytomorphology and immunostaining pattern consistent with serous carcinoma to include strong positive diffuse reactivity for p53, p16, and vimentin, focal subset CK7 and CK AE1/AE3 expression, with high Ki-67 staining (approximately 60%).  Neuroendocrine differentiation is present (CD56 negative but synaptophysin positive).  The stromal component is composed of malignant mitotically active cells and heterologous elements with focal chondroid differentiation (S100 positive).  SMA appears to decorate rare tumor cells and highlights vascular structures.  The tumor cells are non-reactive for CK20, WT1, GATA3, p40 and desmin.  Block A5 is referred to [facility], [place]) to perform molecular characterization.  These findings are shared with  Dr. Luanne Bras on [phone] at [phone] hours by telephone conversation. This case received prospective intradepartmental quality assurance review.   Clinical Information    Cervical mass  Gross Description    A. Cervix Received fresh labeled with the patient's name and date of birth and "cervical mass frozen section" per container is an aggregate of red-tan friable soft tissue measuring approximately 5 x 5 x 3 cm in toto.  Representative sections are frozen for an intraoperative consultation.  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   Results rendered by Dr. Wyatt Portela and reported to Dr. Macarthur Critchley at [facility] on 08/11/2021 at 8:06 AM.  Microscopic Description    Microscopic examination supports the above diagnosis.  Single antibody immunostains are performed to include: S100, p40, p16, p53, PAX8, CK7, CK20, vimentin, CK AE1/3, GATA3, WT1, desmin, SMA, synaptophysin, CD56, and Ki-67.  All controls are appropriately reactive. 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 There is inhomogeneous enhancement in the uterus consistent with malignant neoplasm.   There are enlarged lymph nodes in the retroperitoneum in the para-and paracaval regions measuring up to 12 mm in short axis. There are a few enlarged lymph nodes adjacent to the iliac vessels largest measuring 2.6 x 1.4 cm adjacent to the right external iliac vessels. Findings suggest possible metastatic lymphadenopathy. There is mild prominence of the pelvocaliceal system in the left kidney and proximal left ureter. Possibility of  extrinsic compression or infiltrative process related to the uterine neoplasm in the distal course of left ureter causing mild obstruction is not excluded.   Gallbladder stone.  Diverticulosis of colon.   Other findings as described in the body of the report.   09/01/2021 Imaging   US pelvis  Large central uterine ill-defined mass approximately 7.7 cm diameter enlarging uterus likely representing the biopsy-proven neoplasm, likely of endometrial origin.   Questionable intramural leiomyoma posterior mid uterus 3.1 cm diameter.   The mass extend and the adnexal regions are poorly characterized but may potentially be better evaluated by MR imaging with and without contrast.   09/10/2021 Surgery   Pre-operative Diagnosis: carcinosarcoma of the uterus, adenopathy on imaging   Post-operative Diagnosis: same, stage IVB carcinosarcoma of the uterus   Operation: Diagnostic laparoscopy    Surgeon: Jeral Pinch MD Operative Findings: On EUA, cervix 2 cm dilated with tumor noted within the os. Cervix otherwise normal appearing. Uterus enlarged, 12 cm, with limited mobility secondary to weight. Compression of the rectum from uterus although no direct invasion appreciated. On intra-abdominal inspection, adhesions noted between the liver and the anterior abdominal wall. Evidence of carcinomatosis involving liver surface, diaphragm, omentum, anterior abdominal wall. Surface of the uterus with obvious tumor infiltration. Small bowel free from uterus. Posteriorly, sigmoid densely adherent to the posterior uterus. Even with manipulator, unable to move the uterus anteriorly. Minimal ascites.    Given findings of stage IV disease and what would require large bowel resection and end ostomy, decision made to abort surgery in favor of neoadjuvant chemotherapy. I had discussed this as a distinct possibility when we had gotten her CT results as well as this morning prior to the surgery.    09/15/2021 Cancer Staging    Staging form: Corpus Uteri - Carcinoma and Carcinosarcoma, AJCC 8th Edition - Clinical stage from 09/15/2021: FIGO Stage IVB (cT3, cN2a, pM1) - Signed by Heath Lark, MD on 09/15/2021 Stage prefix: Initial diagnosis   09/24/2021 -  Chemotherapy   Patient is on Treatment Plan : UTERINE ENDOMETRIAL Dostarlimab-gxly (500 mg) + Carboplatin (AUC 5) + Paclitaxel (175 mg/m2) q21d x 6 cycles / Dostarlimab-gxly (1000 mg) q42d x 6 cycles      10/17/2021 Procedure   Status post revision of nonfunctional right IJ port catheter, with new right IJ port catheter placed. Port is ready for use.       PHYSICAL EXAMINATION: ECOG PERFORMANCE STATUS: 1 - Symptomatic but completely ambulatory  Vitals:   11/06/21 0905  BP: 138/88  Pulse: 91  Resp: 18  Temp: (!) 97.4 F (36.3 C)  SpO2: 100%   Filed Weights   11/06/21 0905  Weight: 215 lb 8 oz (97.8 kg)    GENERAL:alert, no distress and comfortable HEART: She has moderate bilateral lower extremity edema ABDOMEN:abdomen soft, non-tender and normal bowel sounds Musculoskeletal:no cyanosis of digits and no clubbing  NEURO: alert & oriented x 3 with fluent speech, no focal motor/sensory deficits  LABORATORY DATA:  I have reviewed the data as listed    Component Value Date/Time   NA 140 11/06/2021 0846   K 3.5 11/06/2021 0846   CL 100 11/06/2021 0846   CO2 28 11/06/2021 0846   GLUCOSE 150 (H) 11/06/2021 0846   BUN 12 11/06/2021 0846   CREATININE 0.56 11/06/2021 0846   CALCIUM 9.4 11/06/2021 0846   PROT 7.5 11/06/2021 0846   ALBUMIN 4.0 11/06/2021 0846   AST 16 11/06/2021 0846   ALT 7 11/06/2021 0846   ALKPHOS 55 11/06/2021 0846   BILITOT 0.5 11/06/2021 0846   GFRNONAA >60 11/06/2021 0846    No results found for: "SPEP", "UPEP"  Lab Results  Component Value Date   WBC 7.3 11/06/2021   NEUTROABS 6.3 11/06/2021   HGB 9.7 (L) 11/06/2021   HCT 31.1 (  L) 11/06/2021   MCV 83.2 11/06/2021   PLT 266 11/06/2021      Chemistry      Component  Value Date/Time   NA 140 11/06/2021 0846   K 3.5 11/06/2021 0846   CL 100 11/06/2021 0846   CO2 28 11/06/2021 0846   BUN 12 11/06/2021 0846   CREATININE 0.56 11/06/2021 0846      Component Value Date/Time   CALCIUM 9.4 11/06/2021 0846   ALKPHOS 55 11/06/2021 0846   AST 16 11/06/2021 0846   ALT 7 11/06/2021 0846   BILITOT 0.5 11/06/2021 0846

## 2021-11-06 NOTE — Assessment & Plan Note (Signed)
Clinically, she is improving We will proceed with treatment without delay We discussed importance of frequent small meals and avoidance of constipation I plan to repeat imaging study after cycle 3

## 2021-11-06 NOTE — Assessment & Plan Note (Signed)
I recommend elastic compression hose, frequent ambulation and leg elevation as much as possible She is retaining fluid I recommend low-dose furosemide Her daughter is instructed to monitor her weight carefully

## 2021-11-06 NOTE — Assessment & Plan Note (Signed)
Her blood pressure is intermittently elevated She will continue current prescribed medications  I emphasized importance of hydration

## 2021-11-07 ENCOUNTER — Other Ambulatory Visit: Payer: Self-pay

## 2021-11-08 ENCOUNTER — Other Ambulatory Visit (HOSPITAL_COMMUNITY): Payer: Self-pay

## 2021-11-08 LAB — T4: T4, Total: 9.5 ug/dL (ref 4.5–12.0)

## 2021-11-10 ENCOUNTER — Other Ambulatory Visit: Payer: Self-pay

## 2021-11-11 DIAGNOSIS — E042 Nontoxic multinodular goiter: Secondary | ICD-10-CM | POA: Insufficient documentation

## 2021-11-14 ENCOUNTER — Telehealth: Payer: Self-pay

## 2021-11-14 NOTE — Telephone Encounter (Signed)
64 ounces of water is just the bare minimum, her dark urine could be related to dehydration As long as she has BM, the color is irrelevant I recommend increase oral fluids as tolerated

## 2021-11-14 NOTE — Telephone Encounter (Signed)
Returned call to daughter. Her Mom for 2-3 days has been having dark brown urine, then it may clear up. Denies vaginal bleeding, pain and burning.  Denies constipation. Yesterday when she had bm the stool was a dark gray color. She is taking laxatives. She eating with no problems and drinking at least 64 ounces daily.  Her daughter is asking if you have any thoughts?

## 2021-11-14 NOTE — Telephone Encounter (Signed)
Called and given below message. Daughter verbalized understanding and will call the office back for concerns.

## 2021-11-17 ENCOUNTER — Other Ambulatory Visit: Payer: Self-pay | Admitting: Hematology and Oncology

## 2021-11-17 ENCOUNTER — Other Ambulatory Visit (HOSPITAL_COMMUNITY): Payer: Self-pay

## 2021-11-17 MED ORDER — FUROSEMIDE 20 MG PO TABS
20.0000 mg | ORAL_TABLET | Freq: Every day | ORAL | 0 refills | Status: DC
  Filled 2021-11-17: qty 30, 30d supply, fill #0

## 2021-11-19 ENCOUNTER — Telehealth: Payer: Self-pay

## 2021-11-19 NOTE — Telephone Encounter (Signed)
Per Joylene John NP, I reached out to [place], pt's daughter. Heath Lark is aware Lenna Sciara has    tentatively held a spot for Ms. Yasuda for surgery on October 26 so the spot does not get taken. This may change based on her CT scan results and the surgery date can always be adjusted. She sees [place] on the 17th of Oct where Dr. Darene Lamer will discuss the scan and next steps.    Heath Lark voiced an understanding and states she will call her mom as soon as she hangs up with me.

## 2021-11-21 ENCOUNTER — Ambulatory Visit (HOSPITAL_COMMUNITY)
Admission: RE | Admit: 2021-11-21 | Discharge: 2021-11-21 | Disposition: A | Discharge status: Home / Self Care | Payer: Medicare Other | Source: Ambulatory Visit | Attending: Hematology and Oncology | Admitting: Hematology and Oncology

## 2021-11-21 ENCOUNTER — Telehealth: Payer: Self-pay | Admitting: Radiology

## 2021-11-21 DIAGNOSIS — C549 Malignant neoplasm of corpus uteri, unspecified: Secondary | ICD-10-CM | POA: Diagnosis not present

## 2021-11-21 MED ORDER — IOHEXOL 300 MG/ML  SOLN
100.0000 mL | Freq: Once | INTRAMUSCULAR | Status: AC | PRN
  Administered 2021-11-21: 100 mL via INTRAVENOUS

## 2021-11-21 MED ORDER — SODIUM CHLORIDE (PF) 0.9 % IJ SOLN
INTRAMUSCULAR | Status: AC
  Filled 2021-11-21: qty 50

## 2021-11-21 NOTE — Telephone Encounter (Signed)
A Randomized pragmatic Chair-Based Home Exercise Intervention for Mitigating Cancer-Related Fatigue in Older Adults Undergoing Chemotherapy for Advanced Disease   11/21/2021  7 WEEK PHONE CALL: Confirmed I was speaking with Sarah Wu . Informed patient reason for call is to check in on how she is doing with her chair exercises on the above mentioned study. Patient stated she continues to do the exercises and is doing well overall. Due to scheduling and other appointments, we will complete 6 week in person assessment next Tuesday. Requested patient return Home Exercise Logs. Patient verbalized understanding. This coordinator thanked patient for her time and continued support of the above mentioned study and encouraged patient to continue chair exercises.   Carol Ada, RT(R)(T) Clinical Research Coordinator

## 2021-11-21 NOTE — Progress Notes (Signed)
Sent message, via epic in basket, requesting orders in epic from surgeon.  

## 2021-11-24 ENCOUNTER — Encounter: Payer: Self-pay | Admitting: Gynecologic Oncology

## 2021-11-25 ENCOUNTER — Inpatient Hospital Stay: Payer: Medicare Other | Attending: Gynecologic Oncology | Admitting: Gynecologic Oncology

## 2021-11-25 ENCOUNTER — Telehealth: Payer: Self-pay

## 2021-11-25 ENCOUNTER — Encounter: Payer: Self-pay | Admitting: Gynecologic Oncology

## 2021-11-25 ENCOUNTER — Inpatient Hospital Stay: Payer: Medicare Other | Admitting: Radiology

## 2021-11-25 DIAGNOSIS — Z5111 Encounter for antineoplastic chemotherapy: Secondary | ICD-10-CM | POA: Insufficient documentation

## 2021-11-25 DIAGNOSIS — C801 Malignant (primary) neoplasm, unspecified: Secondary | ICD-10-CM | POA: Diagnosis not present

## 2021-11-25 DIAGNOSIS — C55 Malignant neoplasm of uterus, part unspecified: Secondary | ICD-10-CM | POA: Insufficient documentation

## 2021-11-25 DIAGNOSIS — I10 Essential (primary) hypertension: Secondary | ICD-10-CM | POA: Insufficient documentation

## 2021-11-25 DIAGNOSIS — Z7189 Other specified counseling: Secondary | ICD-10-CM | POA: Diagnosis not present

## 2021-11-25 DIAGNOSIS — R600 Localized edema: Secondary | ICD-10-CM | POA: Insufficient documentation

## 2021-11-25 DIAGNOSIS — D649 Anemia, unspecified: Secondary | ICD-10-CM | POA: Insufficient documentation

## 2021-11-25 DIAGNOSIS — Z79899 Other long term (current) drug therapy: Secondary | ICD-10-CM | POA: Insufficient documentation

## 2021-11-25 NOTE — Patient Instructions (Signed)
SURGICAL WAITING ROOM VISITATION Patients having surgery or a procedure may have no more than 2 support people in the waiting area - these visitors may rotate.   Children under the age of 70 must have an adult with them who is not the patient. If the patient needs to stay at the hospital during part of their recovery, the visitor guidelines for inpatient rooms apply. Pre-op nurse will coordinate an appropriate time for 1 support person to accompany patient in pre-op.  This support person may not rotate.    Please refer to the [facility] website for the visitor guidelines for Inpatients (after your surgery is over and you are in a regular room).      Your procedure is scheduled on: 12-04-21   Report to [facility] Main Entrance    Report to admitting at 8:45 AM   Call this number if you have problems the morning of surgery [phone]   Do not eat food :After Midnight.   After Midnight you may have the following liquids until 8:00 AM DAY OF SURGERY  Water Non-Citrus Juices (without pulp, NO RED) Carbonated Beverages Black Coffee (NO MILK/CREAM OR CREAMERS, sugar ok)  Clear Tea (NO MILK/CREAM OR CREAMERS, sugar ok) regular and decaf                             Plain Jell-O (NO RED)                                           Fruit ices (not with fruit pulp, NO RED)                                     Popsicles (NO RED)                                                               Sports drinks like Gatorade (NO RED)                   The day of surgery:  Drink ONE (1) Pre-Surgery Clear Ensure or G2 at 8:00 AM the morning of surgery. Drink in one sitting. Do not sip.  This drink was given to you during your hospital  pre-op appointment visit. Nothing else to drink after completing the Pre-Surgery Clear Ensure or G2.          If you have questions, please contact your surgeon's office.   FOLLOW BOWEL PREP AND ANY ADDITIONAL PRE OP INSTRUCTIONS YOU RECEIVED FROM YOUR  SURGEON'S OFFICE!!!     Oral Hygiene is also important to reduce your risk of infection.                                    Remember - BRUSH YOUR TEETH THE MORNING OF SURGERY WITH YOUR REGULAR TOOTHPASTE   Do NOT smoke after Midnight   Take these medicines the morning of surgery with A SIP OF WATER:   Allopurinol  Amlodipine  Atenolol  Compazine if neeeded  Tramadol if needed                              You may not have any metal on your body including hair pins, jewelry, and body piercing             Do not wear make-up, lotions, powders, perfumes or deodorant  Do not wear nail polish including gel and S&S, artificial/acrylic nails, or any other type of covering on natural nails including finger and toenails. If you have artificial nails, gel coating, etc. that needs to be removed by a nail salon please have this removed prior to surgery or surgery may need to be canceled/ delayed if the surgeon/ anesthesia feels like they are unable to be safely monitored.   Do not shave  48 hours prior to surgery.    Do not bring valuables to the hospital. [place].   Contacts, dentures or bridgework may not be worn into surgery.   Bring small overnight bag day of surgery.   DO NOT [place]. PHARMACY WILL DISPENSE MEDICATIONS LISTED ON YOUR MEDICATION LIST TO YOU DURING YOUR ADMISSION [place]!    Special Instructions: Bring a copy of your healthcare power of attorney and living will documents the day of surgery if you haven't scanned them before.              Please read over the following fact sheets you were given: IF [place]  If you received a COVID test during your pre-op visit  it is requested that you wear a mask when out in public, stay away from anyone that may not be feeling well and notify your surgeon if you develop symptoms. If you  test positive for Covid or have been in contact with anyone that has tested positive in the last 10 days please notify you surgeon.  [facility] [place] - Preparing for Surgery Before surgery, you can play an important role.  Because skin is not sterile, your skin needs to be as free of germs as possible.  You can reduce the number of germs on your skin by washing with CHG (chlorahexidine gluconate) soap before surgery.  CHG is an antiseptic cleaner which kills germs and bonds with the skin to continue killing germs even after washing. Please DO NOT use if you have an allergy to CHG or antibacterial soaps.  If your skin becomes reddened/irritated stop using the CHG and inform your nurse when you arrive at Short Stay. Do not shave (including legs and underarms) for at least 48 hours prior to the first CHG shower.  You may shave your face/neck.  Please follow these instructions carefully:  1.  Shower with CHG Soap the night before surgery and the  morning of surgery.  2.  If you choose to wash your hair, wash your hair first as usual with your normal  shampoo.  3.  After you shampoo, rinse your hair and body thoroughly to remove the shampoo.                             4.  Use CHG as you would any other liquid soap.  You can apply chg directly to the skin and wash.  Gently with  a scrungie or clean washcloth.  5.  Apply the CHG Soap to your body ONLY FROM THE NECK DOWN.   Do   not use on face/ open                           Wound or open sores. Avoid contact with eyes, ears mouth and   genitals (private parts).                       Wash face,  Genitals (private parts) with your normal soap.             6.  Wash thoroughly, paying special attention to the area where your    surgery  will be performed.  7.  Thoroughly rinse your body with warm water from the neck down.  8.  DO NOT shower/wash with your normal soap after using and rinsing off the CHG Soap.                9.  Pat yourself dry with a clean  towel.            10.  Wear clean pajamas.            11.  Place clean sheets on your bed the night of your first shower and do not  sleep with pets. Day of Surgery : Do not apply any lotions/deodorants the morning of surgery.  Please wear clean clothes to the hospital/surgery center.  FAILURE TO FOLLOW THESE INSTRUCTIONS MAY RESULT IN THE CANCELLATION OF YOUR SURGERY  PATIENT SIGNATURE_________________________________  NURSE SIGNATURE__________________________________  ________________________________________________________________________    WHAT IS A BLOOD TRANSFUSION? Blood Transfusion Information  A transfusion is the replacement of blood or some of its parts. Blood is made up of multiple cells which provide different functions. Red blood cells carry oxygen and are used for blood loss replacement. White blood cells fight against infection. Platelets control bleeding. Plasma helps clot blood. Other blood products are available for specialized needs, such as hemophilia or other clotting disorders. BEFORE THE TRANSFUSION  Who gives blood for transfusions?  Healthy volunteers who are fully evaluated to make sure their blood is safe. This is blood bank blood. Transfusion therapy is the safest it has ever been in the practice of medicine. Before blood is taken from a donor, a complete history is taken to make sure that person has no history of diseases nor engages in risky social behavior (examples are intravenous drug use or sexual activity with multiple partners). The donor's travel history is screened to minimize risk of transmitting infections, such as malaria. The donated blood is tested for signs of infectious diseases, such as HIV and hepatitis. The blood is then tested to be sure it is compatible with you in order to minimize the chance of a transfusion reaction. If you or a relative donates blood, this is often done in anticipation of surgery and is not appropriate for emergency  situations. It takes many days to process the donated blood. RISKS AND COMPLICATIONS Although transfusion therapy is very safe and saves many lives, the main dangers of transfusion include:  Getting an infectious disease. Developing a transfusion reaction. This is an allergic reaction to something in the blood you were given. Every precaution is taken to prevent this. The decision to have a blood transfusion has been considered carefully by your caregiver before blood is given. Blood is not given unless the benefits outweigh  the risks. AFTER THE TRANSFUSION Right after receiving a blood transfusion, you will usually feel much better and more energetic. This is especially true if your red blood cells have gotten low (anemic). The transfusion raises the level of the red blood cells which carry oxygen, and this usually causes an energy increase. The nurse administering the transfusion will monitor you carefully for complications. HOME CARE INSTRUCTIONS  No special instructions are needed after a transfusion. You may find your energy is better. Speak with your caregiver about any limitations on activity for underlying diseases you may have. SEEK MEDICAL CARE IF:  Your condition is not improving after your transfusion. You develop redness or irritation at the intravenous (IV) site. SEEK IMMEDIATE MEDICAL CARE IF:  Any of the following symptoms occur over the next 12 hours: Shaking chills. You have a temperature by mouth above 102 F (38.9 C), not controlled by medicine. Chest, back, or muscle pain. People around you feel you are not acting correctly or are confused. Shortness of breath or difficulty breathing. Dizziness and fainting. You get a rash or develop hives. You have a decrease in urine output. Your urine turns a dark color or changes to pink, red, or brown. Any of the following symptoms occur over the next 10 days: You have a temperature by mouth above 102 F (38.9 C), not controlled  by medicine. Shortness of breath. Weakness after normal activity. The white part of the eye turns yellow (jaundice). You have a decrease in the amount of urine or are urinating less often. Your urine turns a dark color or changes to pink, red, or brown. Document Released: 01/24/2000 Document Revised: 04/20/2011 Document Reviewed: 09/12/2007 [facility] Patient Information 2014 [place], [place].  _______________________________________________________________________

## 2021-11-25 NOTE — Progress Notes (Signed)
Gynecologic Oncology Telehealth Note: Gyn-Onc  I connected with Sarah Wu on 11/25/21 at  3:00 PM EDT by telephone and verified that I am speaking with the correct person using two identifiers.  I discussed the limitations, risks, security and privacy concerns of performing an evaluation and management service by telemedicine and the availability of in-person appointments. I also discussed with the patient that there may be a patient responsible charge related to this service. The patient expressed understanding and agreed to proceed.  Other persons participating in the visit and their role in the encounter: patient's daughter.  Patient's location: home Provider's location: [facility]  Reason for Visit: follow-up after recent imaging  Treatment History: Oncology History Overview Note  Outside path showed carcinosarcoma, PD-L1 positive, MSI stable   Uterine cancer ([place])  07/30/2021 Initial Diagnosis   She was evaluated for post-menopausal bleeding. She reported having been seen a year previously and having a biopsy that was negative.  On exam in clinic, she was noted to have a mass present within the vagina.  This was thought to be a prolapsing fibroid and plan was made for exam under anesthesia, removal prolapsing fibroid with hysteroscopy and D&C   08/11/2021 Pathology Results   PD-L1 [id number]):                                             Does meet FDA-approval, Combined Positive Score (CPS): >=1   Pan-TRK IHC:                                              Tumor cells are Focally Positive for TRK protein expression.   MICROSATELLITE INSTABILITY:             MS-Stable   TUMOR MUTATION BURDEN:                10.8 Muts/Mb TMB-Low   OTHER BIOMARKERS:                              MET amplification PIK3CA amplification MDM2 amplification CCNE1 amplification   Pertinent Negative Biomarkers Evaluated by NGS: ALK ATM BARD1 BRAF BRCA1 BRCA2 CDK12 CHECK1 CHEK2 EGFR  ERBB2 ESR1 FANCL FGFR2 FGFR3  IDH1 IDH2 KIT KRAS NRAS NTRK1 NTRK2 NTRK3 PALB2 PDGFRA RAD51B RAD51C  RAD51D RAD54L RET ROS1   Comment:  The above studies are performed and reported by [facility], [place]) as part of the [employer] (NGS) panel.  Pertinent results are summarized above.  Please see their separate reports for more detailed information  Addendum electronically signed by Elsworth Soho, MD on 09/03/2021 at  7:37 AM  Final Diagnosis    A.  CERVICAL MASS, EXCISION:       - CONSISTENT WITH CARCINOSARCOMA (HIGH GRADE MALIGNANT BIPHASIC TUMOR)  Electronically signed by Elsworth Soho, MD on 08/15/2021 at  9:53 AM  Comment    The malignant epithelial component exhibits high-grade cytomorphology and immunostaining pattern consistent with serous carcinoma to include strong positive diffuse reactivity for p53, p16, and vimentin, focal subset CK7 and CK AE1/AE3 expression, with high Ki-67 staining (approximately 60%).  Neuroendocrine differentiation is present (CD56 negative but synaptophysin positive).  The stromal component is composed of malignant mitotically active  cells and heterologous elements with focal chondroid differentiation (S100 positive).  SMA appears to decorate rare tumor cells and highlights vascular structures.  The tumor cells are non-reactive for CK20, WT1, GATA3, p40 and desmin.  Block A5 is referred to [facility], [place]) to perform molecular characterization.  These findings are shared with Dr. Luanne Bras on [phone] at [phone] hours by telephone conversation. This case received prospective intradepartmental quality assurance review.   Clinical Information    Cervical mass  Gross Description    A. Cervix Received fresh labeled with the patient's name and date of birth and "cervical mass frozen section" per container is an aggregate of red-tan friable soft tissue measuring approximately 5 x 5 x 3 cm in toto.  Representative sections are frozen for an intraoperative consultation.   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   Results rendered by Dr. Wyatt Portela and reported to Dr. Macarthur Critchley at [facility] on 08/11/2021 at 8:06 AM.  Microscopic Description    Microscopic examination supports the above diagnosis.  Single antibody immunostains are performed to include: S100, p40, p16, p53, PAX8, CK7, CK20, vimentin, CK AE1/3, GATA3, WT1, desmin, SMA, synaptophysin, CD56, and Ki-67.  All controls are appropriately reactive. [id number], [id number], [id number], [id number], [id number], [id number] (15)          08/22/2021 Initial Diagnosis   Carcinosarcoma ([place])   09/01/2021 Imaging   CT abdomen and pelvis There is marked enlargement of the uterus measuring 13.7 x 10 x 8 x 10.1 cm. There is inhomogeneous enhancement in the uterus consistent with malignant neoplasm.   There are enlarged lymph nodes in the retroperitoneum in the para-and paracaval regions measuring up to 12 mm in short axis. There are a few enlarged lymph nodes adjacent to the iliac vessels largest measuring 2.6 x 1.4 cm adjacent to the right external iliac vessels. Findings suggest possible metastatic lymphadenopathy. There is mild prominence of the pelvocaliceal system in the left kidney and proximal left ureter. Possibility of extrinsic compression or infiltrative process related to the uterine neoplasm in the distal course of left ureter causing mild obstruction is not excluded.   Gallbladder stone.  Diverticulosis of colon.   Other findings as described in the body of the report.   09/01/2021 Imaging   US pelvis Large central uterine ill-defined mass approximately 7.7 cm diameter enlarging uterus likely representing the biopsy-proven neoplasm, likely of endometrial origin.   Questionable intramural leiomyoma  posterior mid uterus 3.1 cm diameter.   The mass extend and the adnexal regions are poorly characterized but may potentially be better evaluated by MR imaging with and without contrast.   09/10/2021 Surgery   Pre-operative Diagnosis: carcinosarcoma of the uterus, adenopathy on imaging   Post-operative Diagnosis: same, stage IVB carcinosarcoma of the uterus   Operation: Diagnostic laparoscopy    Surgeon: Jeral Pinch MD Operative Findings: On EUA, cervix 2 cm dilated with tumor noted within the os. Cervix otherwise normal appearing. Uterus enlarged, 12 cm, with limited mobility secondary to weight. Compression of the rectum from uterus although no direct invasion appreciated. On intra-abdominal inspection, adhesions noted between the liver and the anterior abdominal wall. Evidence of carcinomatosis involving liver surface, diaphragm, omentum, anterior abdominal wall. Surface of the uterus with obvious tumor infiltration. Small bowel free from uterus. Posteriorly, sigmoid densely adherent  to the posterior uterus. Even with manipulator, unable to move the uterus anteriorly. Minimal ascites.    Given findings of stage IV disease and what would require large bowel resection and end ostomy, decision made to abort surgery in favor of neoadjuvant chemotherapy. I had discussed this as a distinct possibility when we had gotten her CT results as well as this morning prior to the surgery.    09/15/2021 Cancer Staging   Staging form: Corpus Uteri - Carcinoma and Carcinosarcoma, AJCC 8th Edition - Clinical stage from 09/15/2021: FIGO Stage IVB (cT3, cN2a, pM1) - Signed by Heath Lark, MD on 09/15/2021 Stage prefix: Initial diagnosis   09/24/2021 -  Chemotherapy   Patient is on Treatment Plan : UTERINE ENDOMETRIAL Dostarlimab-gxly (500 mg) + Carboplatin (AUC 5) + Paclitaxel (175 mg/m2) q21d x 6 cycles / Dostarlimab-gxly (1000 mg) q42d x 6 cycles      10/17/2021 Procedure   Status post revision of nonfunctional  right IJ port catheter, with new right IJ port catheter placed. Port is ready for use.     11/24/2021 Imaging   1. Today's study demonstrates a positive response to therapy with partial involution of large malignant appearing uterine mass. 2. Multiple prominent borderline enlarged and mildly enlarged retroperitoneal and pelvic lymph nodes are stable to minimally decreased in size compared to the prior study, potentially metastatic. No new lymphadenopathy is noted elsewhere in the chest, abdomen or pelvis. 3. Multiple small pulmonary nodules measuring 5 mm or less in the lungs. These were incidentally imaged on the prior CT of the abdomen and pelvis 09/01/2021, and at this time are stable. The possibility of metastatic disease is not excluded, and close attention on follow-up studies is recommended. 4. Colonic diverticulosis without evidence of acute diverticulitis at this time. 5. Additional incidental findings, as above.      Interval History: Doing well. Denies nausea. Appetite improved; consuming 80-90 grams of protein/day. Strength and energy improved after first cycle.  Bowels moving most days. Denies any urinary symptoms.  Denies vaginal bleeding.   Past Medical/Surgical History: Past Medical History:  Diagnosis Date   Arthritis    Fibroid tumor    Hx of mammogram 2022   [place]   Hypercholesteremia    Hypertension    Post-menopausal bleeding    Pre-diabetes     Past Surgical History:  Procedure Laterality Date   EXAMINATION UNDER ANESTHESIA  08/11/2021   with cervical mass biopsies   IR CHEST FLUORO  10/14/2021   IR IMAGING GUIDED PORT INSERTION  09/23/2021   IR IMAGING GUIDED PORT INSERTION  10/17/2021   LAPAROSCOPY N/A 09/10/2021   Procedure: DIAGNOSTIC LAPAROSCOPY;  Surgeon: Lafonda Mosses, MD;  Location: [facility];  Service: Gynecology;  Laterality: N/A;   REPLACEMENT TOTAL KNEE BILATERAL     TUBAL LIGATION      Family History  Problem Relation Age of Onset    Breast cancer Sister        half-sister   Colon cancer Neg Hx    Ovarian cancer Neg Hx    Endometrial cancer Neg Hx    Pancreatic cancer Neg Hx    Prostate cancer Neg Hx     Social History   Socioeconomic History   Marital status: Single    Spouse name: Not on file   Number of children: Not on file   Years of education: Not on file   Highest education level: Not on file  Occupational History   Not on file  Tobacco Use  Smoking status: Never    Passive exposure: Never   Smokeless tobacco: Never  Vaping Use   Vaping Use: Never used  Substance and Sexual Activity   Alcohol use: Never   Drug use: Never   Sexual activity: Not Currently  Other Topics Concern   Not on file  Social History Narrative   Not on file   Social Determinants of Health   Financial Resource Strain: Not on file  Food Insecurity: Food Insecurity Present (09/24/2021)   Hunger Vital Sign    Worried About Running Out of Food in the Last Year: Sometimes true    Ran Out of Food in the Last Year: Not on file  Transportation Needs: No Transportation Needs (09/24/2021)   PRAPARE - Hydrologist (Medical): No    Lack of Transportation (Non-Medical): No  Physical Activity: Not on file  Stress: Not on file  Social Connections: Not on file    Current Medications:  Current Outpatient Medications:    allopurinol (ZYLOPRIM) 300 MG tablet, Take 300 mg by mouth daily., Disp: , Rfl:    amLODipine (NORVASC) 5 MG tablet, Take 5 mg by mouth daily., Disp: , Rfl:    atenolol (TENORMIN) 50 MG tablet, Take 50 mg by mouth daily., Disp: , Rfl:    atorvastatin (LIPITOR) 10 MG tablet, Take 10 mg by mouth at bedtime., Disp: , Rfl:    dexamethasone (DECADRON) 4 MG tablet, Take 2 tablets by mouth the night before and 2 tablets the morning of chemotherapy, every 3 weeks, for 6 cycles, Disp: 36 tablet, Rfl: 6   ferrous sulfate 325 (65 FE) MG tablet, Take 325 mg by mouth daily., Disp: , Rfl:     furosemide (LASIX) 20 MG tablet, Take 1 tablet (20 mg total) by mouth daily., Disp: 30 tablet, Rfl: 0   lidocaine-prilocaine (EMLA) cream, Apply to affected area once, Disp: 30 g, Rfl: 3   ondansetron (ZOFRAN) 8 MG tablet, Take 1 tablet by mouth every 8 hours as needed for nausea or vomiting. Start on the third day after chemotherapy., Disp: 30 tablet, Rfl: 1   polyethylene glycol (MIRALAX / GLYCOLAX) 17 g packet, Take 17 g by mouth 2 (two) times daily., Disp: , Rfl:    prochlorperazine (COMPAZINE) 10 MG tablet, Take 1 tablet (10 mg total) by mouth every 6 (six) hours as needed for nausea or vomiting., Disp: 30 tablet, Rfl: 1   senna-docusate (SENOKOT-S) 8.6-50 MG tablet, Take 2 tablets by mouth 2 (two) times daily., Disp: 30 tablet, Rfl: 0   traMADol (ULTRAM) 50 MG tablet, Take 2 tablets (100 mg total) by mouth every 6 (six) hours as needed for severe pain., Disp: 60 tablet, Rfl: 0   cholecalciferol (VITAMIN D3) 25 MCG (1000 UNIT) tablet, Take 1,000 Units by mouth daily., Disp: , Rfl:    meloxicam (MOBIC) 7.5 MG tablet, Take 7.5 mg by mouth daily., Disp: , Rfl:    Multiple Vitamins-Minerals (ADULT GUMMY) CHEW, Chew 2 capsules by mouth daily., Disp: , Rfl:   Review of Symptoms: Pertinent positives as per HPI.  Physical Exam: Deferred given limitations of phone visit.  Laboratory & Radiologic Studies: CT C/A/P on 10/13: 1. Today's study demonstrates a positive response to therapy with partial involution of large malignant appearing uterine mass. 2. Multiple prominent borderline enlarged and mildly enlarged retroperitoneal and pelvic lymph nodes are stable to minimally decreased in size compared to the prior study, potentially metastatic. No new lymphadenopathy is noted elsewhere in the  chest, abdomen or pelvis. 3. Multiple small pulmonary nodules measuring 5 mm or less in the lungs. These were incidentally imaged on the prior CT of the abdomen and pelvis 09/01/2021, and at this time are stable.  The possibility of metastatic disease is not excluded, and close attention on follow-up studies is recommended. 4. Colonic diverticulosis without evidence of acute diverticulitis at this time. 5. Additional incidental findings, as above.  Assessment & Plan: Sarah Wu is a 71 y.o. woman with Stage IV carcinosarcoma of the uterus who presents for follow-up after 3 cycles of NACT.  The patient is doing well on neoadjuvant chemotherapy with minimal side effects.  Has overall had increase in her appetite, strength, and energy level.  Discussed with the patient and her daughter recent CT scan which show improvement of metastatic disease.  I have recommended 1 additional cycle of chemotherapy prior to attempted debulking in the hopes of decreasing morbidity related to her surgery.  We will have the patient return for in person preoperative visit and pelvic exam.  Surgery is tentatively scheduled at the end of November.  We will plan for at least an overnight stay if the patient has minimally invasive surgery.  We discussed that this may be a longer stay with either an open surgery or if she requires bowel resection.  I discussed the assessment and treatment plan with the patient. The patient was provided with an opportunity to ask questions and all were answered. The patient agreed with the plan and demonstrated an understanding of the instructions.   The patient was advised to call back or see an in-person evaluation if the symptoms worsen or if the condition fails to improve as anticipated.   12 minutes of total time was spent for this patient encounter, including preparation, phone counseling with the patient and coordination of care, and documentation of the encounter.   Jeral Pinch, MD  Division of Gynecologic Oncology  Department of Obstetrics and Gynecology  [facility]

## 2021-11-25 NOTE — Telephone Encounter (Signed)
Sarah Wu daughter called stating she missed a call. She did say she received a call regarding her appointment today being switched from in person visit to a phone visit. She states she will be waiting for a call around 2:45.

## 2021-11-25 NOTE — Telephone Encounter (Signed)
Per Joylene John NP, Dr. Berline Lopes just spoke with her. Her surgery is scheduled for Nov 22. She will need to come in to the office to see Dr. Berline Lopes and myself for preop before that date.    Pt's daughter Heath Lark, aware of pre-op appointment scheduled for 11/17 @ 2:45

## 2021-11-25 NOTE — Progress Notes (Signed)
COVID Vaccine Completed:  Yes  Date of COVID positive in last 90 days:  PCP - [name], DO Cardiologist -   Chest x-ray - CT chest 11-21-21 Epic EKG - 09-01-21 Epic Stress Test -  ECHO -  Cardiac Cath -  Pacemaker/ICD device last checked: Spinal Cord Stimulator:  Bowel Prep -   Sleep Study -  CPAP -   Fasting Blood Sugar -  Checks Blood Sugar _____ times a day  Blood Thinner Instructions: Aspirin Instructions: Last Dose:  Activity level:  Can go up a flight of stairs and perform activities of daily living without stopping and without symptoms of chest pain or shortness of breath.  Able to exercise without symptoms  Unable to go up a flight of stairs without symptoms of     Anesthesia review:  Current cancer treatment  Patient denies shortness of breath, fever, cough and chest pain at PAT appointment  Patient verbalized understanding of instructions that were given to them at the PAT appointment. Patient was also instructed that they will need to review over the PAT instructions again at home before surgery.

## 2021-11-26 ENCOUNTER — Telehealth: Payer: Self-pay | Admitting: *Deleted

## 2021-11-26 ENCOUNTER — Other Ambulatory Visit: Payer: Self-pay

## 2021-11-26 MED FILL — Dexamethasone Sodium Phosphate Inj 100 MG/10ML: INTRAMUSCULAR | Qty: 1 | Status: AC

## 2021-11-26 MED FILL — Fosaprepitant Dimeglumine For IV Infusion 150 MG (Base Eq): INTRAVENOUS | Qty: 5 | Status: AC

## 2021-11-26 NOTE — Telephone Encounter (Signed)
Called and left Heath Lark the new date and time for the hospital pre op appt

## 2021-11-27 ENCOUNTER — Inpatient Hospital Stay: Payer: Medicare Other

## 2021-11-27 ENCOUNTER — Inpatient Hospital Stay (HOSPITAL_BASED_OUTPATIENT_CLINIC_OR_DEPARTMENT_OTHER): Payer: Medicare Other | Admitting: Hematology and Oncology

## 2021-11-27 ENCOUNTER — Encounter: Payer: Self-pay | Admitting: Hematology and Oncology

## 2021-11-27 ENCOUNTER — Inpatient Hospital Stay: Payer: Medicare Other | Admitting: Radiology

## 2021-11-27 VITALS — BP 153/87 | HR 77 | Temp 97.9°F | Resp 18 | Ht 68.0 in | Wt 212.0 lb

## 2021-11-27 DIAGNOSIS — D638 Anemia in other chronic diseases classified elsewhere: Secondary | ICD-10-CM

## 2021-11-27 DIAGNOSIS — R600 Localized edema: Secondary | ICD-10-CM

## 2021-11-27 DIAGNOSIS — Z79899 Other long term (current) drug therapy: Secondary | ICD-10-CM | POA: Diagnosis not present

## 2021-11-27 DIAGNOSIS — C549 Malignant neoplasm of corpus uteri, unspecified: Secondary | ICD-10-CM

## 2021-11-27 DIAGNOSIS — Z5111 Encounter for antineoplastic chemotherapy: Secondary | ICD-10-CM | POA: Diagnosis not present

## 2021-11-27 DIAGNOSIS — I10 Essential (primary) hypertension: Secondary | ICD-10-CM

## 2021-11-27 DIAGNOSIS — D649 Anemia, unspecified: Secondary | ICD-10-CM | POA: Diagnosis not present

## 2021-11-27 DIAGNOSIS — C55 Malignant neoplasm of uterus, part unspecified: Secondary | ICD-10-CM | POA: Diagnosis present

## 2021-11-27 LAB — CBC WITH DIFFERENTIAL (CANCER CENTER ONLY)
Abs Immature Granulocytes: 0.02 10*3/uL (ref 0.00–0.07)
Basophils Absolute: 0 10*3/uL (ref 0.0–0.1)
Basophils Relative: 0 %
Eosinophils Absolute: 0 10*3/uL (ref 0.0–0.5)
Eosinophils Relative: 0 %
HCT: 31 % — ABNORMAL LOW (ref 36.0–46.0)
Hemoglobin: 9.7 g/dL — ABNORMAL LOW (ref 12.0–15.0)
Immature Granulocytes: 0 %
Lymphocytes Relative: 13 %
Lymphs Abs: 0.9 10*3/uL (ref 0.7–4.0)
MCH: 26.5 pg (ref 26.0–34.0)
MCHC: 31.3 g/dL (ref 30.0–36.0)
MCV: 84.7 fL (ref 80.0–100.0)
Monocytes Absolute: 0.2 10*3/uL (ref 0.1–1.0)
Monocytes Relative: 3 %
Neutro Abs: 6 10*3/uL (ref 1.7–7.7)
Neutrophils Relative %: 84 %
Platelet Count: 274 10*3/uL (ref 150–400)
RBC: 3.66 MIL/uL — ABNORMAL LOW (ref 3.87–5.11)
RDW: 20.2 % — ABNORMAL HIGH (ref 11.5–15.5)
WBC Count: 7.1 10*3/uL (ref 4.0–10.5)
nRBC: 0 % (ref 0.0–0.2)

## 2021-11-27 LAB — CMP (CANCER CENTER ONLY)
ALT: 8 U/L (ref 0–44)
AST: 17 U/L (ref 15–41)
Albumin: 4 g/dL (ref 3.5–5.0)
Alkaline Phosphatase: 53 U/L (ref 38–126)
Anion gap: 9 (ref 5–15)
BUN: 11 mg/dL (ref 8–23)
CO2: 26 mmol/L (ref 22–32)
Calcium: 9.6 mg/dL (ref 8.9–10.3)
Chloride: 104 mmol/L (ref 98–111)
Creatinine: 0.64 mg/dL (ref 0.44–1.00)
GFR, Estimated: >60 mL/min (ref >60)
Glucose, Bld: 118 mg/dL — ABNORMAL HIGH (ref 70–99)
Potassium: 3.9 mmol/L (ref 3.5–5.1)
Sodium: 139 mmol/L (ref 135–145)
Total Bilirubin: 0.5 mg/dL (ref 0.3–1.2)
Total Protein: 7.2 g/dL (ref 6.5–8.1)

## 2021-11-27 MED ORDER — SODIUM CHLORIDE 0.9 % IV SOLN
10.0000 mg | Freq: Once | INTRAVENOUS | Status: AC
  Administered 2021-11-27: 10 mg via INTRAVENOUS
  Filled 2021-11-27: qty 10

## 2021-11-27 MED ORDER — HEPARIN SOD (PORK) LOCK FLUSH 100 UNIT/ML IV SOLN
500.0000 [IU] | Freq: Once | INTRAVENOUS | Status: AC | PRN
  Administered 2021-11-27: 500 [IU]

## 2021-11-27 MED ORDER — SODIUM CHLORIDE 0.9% FLUSH
10.0000 mL | Freq: Once | INTRAVENOUS | Status: AC
  Administered 2021-11-27: 10 mL

## 2021-11-27 MED ORDER — SODIUM CHLORIDE 0.9% FLUSH
10.0000 mL | INTRAVENOUS | Status: DC | PRN
  Administered 2021-11-27: 10 mL

## 2021-11-27 MED ORDER — CETIRIZINE HCL 10 MG/ML IV SOLN
10.0000 mg | Freq: Once | INTRAVENOUS | Status: AC
  Administered 2021-11-27: 10 mg via INTRAVENOUS
  Filled 2021-11-27: qty 1

## 2021-11-27 MED ORDER — FAMOTIDINE IN NACL 20-0.9 MG/50ML-% IV SOLN
20.0000 mg | Freq: Once | INTRAVENOUS | Status: AC
  Administered 2021-11-27: 20 mg via INTRAVENOUS
  Filled 2021-11-27: qty 50

## 2021-11-27 MED ORDER — SODIUM CHLORIDE 0.9 % IV SOLN
150.0000 mg | Freq: Once | INTRAVENOUS | Status: AC
  Administered 2021-11-27: 150 mg via INTRAVENOUS
  Filled 2021-11-27: qty 150

## 2021-11-27 MED ORDER — SODIUM CHLORIDE 0.9 % IV SOLN: Freq: Once | INTRAVENOUS | Status: AC

## 2021-11-27 MED ORDER — SODIUM CHLORIDE 0.9 % IV SOLN
500.0000 mg | Freq: Once | INTRAVENOUS | Status: AC
  Administered 2021-11-27: 500 mg via INTRAVENOUS
  Filled 2021-11-27: qty 10

## 2021-11-27 MED ORDER — PALONOSETRON HCL INJECTION 0.25 MG/5ML
0.2500 mg | Freq: Once | INTRAVENOUS | Status: AC
  Administered 2021-11-27: 0.25 mg via INTRAVENOUS
  Filled 2021-11-27: qty 5

## 2021-11-27 MED ORDER — SODIUM CHLORIDE 0.9 % IV SOLN
517.5000 mg | Freq: Once | INTRAVENOUS | Status: AC
  Administered 2021-11-27: 520 mg via INTRAVENOUS
  Filled 2021-11-27: qty 52

## 2021-11-27 MED ORDER — SODIUM CHLORIDE 0.9 % IV SOLN
175.0000 mg/m2 | Freq: Once | INTRAVENOUS | Status: AC
  Administered 2021-11-27: 378 mg via INTRAVENOUS
  Filled 2021-11-27: qty 63

## 2021-11-27 NOTE — Research (Signed)
A Randomized pragmatic Chair-Based Home Exercise Intervention for Mitigating Cancer-Related Fatigue in Older Adults Undergoing Chemotherapy for Advanced Disease   11/27/2021  WEEK 6: Met with Sarah Wu and her daughter in the research department for week 6 visit for the above mentioned study.    PROs: Per study protocol, all PROs required for this visit were completed prior to other study activities and completeness has been verified.     PHYSICAL TESTING: Physical tests are completed by this clinical research Coordinator. Patient was able to complete all standing exercises as well as 3-meter walk with a cane as a walking aid, but was didn't feel safe completing chair stand exercises.  Chair Exercise Log: Patient does return weeks 1-7 exercise logs today. Per study logs, patient is exercising between 25-30 minutes daily. RPE averaged around 5. Patient does not record RPE score that warrants moving to progressive exercises. Patient will continue standard exercises provided at baseline. Patient's daughter mentioned patient has difficulty with hip exercises and knee extension exercises as well. Per patients daughter, her left leg is tight and it is difficult for that knee to extend, but it has improved. Patient unable to complete hip abduction exercises, stretch #2 or figure 4 stretch.   GIFT CARD: This study does provide visit compensation. $25 [employer] gift card was provided and patient completed Receipt for Participation. This receipt was sent to Nemiah Commander at [facility] via e-mail.  DISPOSITION: Upon completion off all study requirements, patient was escorted to her next appointment.   The patient was thanked for their time and continued voluntary participation in this study. [name] has been provided direct contact information and is encouraged to contact this Coordinator for any needs or questions. Informed patient I will continue to have phone check-ins and our next  visit will be in about 4 weeks. This coordinator will plan around future appointments. Patient is scheduled for surgery on 11/22.   Carol Ada, RT(R)(T) Clinical Research Coordinator

## 2021-11-27 NOTE — Patient Instructions (Signed)
[facility] ONCOLOG[facility]   Discharge Instructions: Thank you for choosing [facility] to provide your oncology and hematology care.   If you have a lab appointment with the [facility], please go directly to the [facility] and check in at the registration area.   Wear comfortable clothing and clothing appropriate for easy access to any Portacath or PICC line.   We strive to give you quality time with your provider. You may need to reschedule your appointment if you arrive late (15 or more minutes).  Arriving late affects you and other patients whose appointments are after yours.  Also, if you miss three or more appointments without notifying the office, you may be dismissed from the clinic at the provider's discretion.      For prescription refill requests, have your pharmacy contact our office and allow 72 hours for refills to be completed.    Today you received the following chemotherapy and/or immunotherapy agents: dostarlimab-gxly, paclitaxel, and carboplatin      To help prevent nausea and vomiting after your treatment, we encourage you to take your nausea medication as directed.  BELOW ARE SYMPTOMS THAT SHOULD BE REPORTED IMMEDIATELY: *FEVER GREATER THAN 100.4 F (38 C) OR HIGHER *CHILLS OR SWEATING *NAUSEA AND VOMITING THAT IS NOT CONTROLLED WITH YOUR NAUSEA MEDICATION *UNUSUAL SHORTNESS OF BREATH *UNUSUAL BRUISING OR BLEEDING *URINARY PROBLEMS (pain or burning when urinating, or frequent urination) *BOWEL PROBLEMS (unusual diarrhea, constipation, pain near the anus) TENDERNESS IN MOUTH AND THROAT WITH OR WITHOUT PRESENCE OF ULCERS (sore throat, sores in mouth, or a toothache) UNUSUAL RASH, SWELLING OR PAIN  UNUSUAL VAGINAL DISCHARGE OR ITCHING   Items with * indicate a potential emergency and should be followed up as soon as possible or go to the Emergency Department if any problems should occur.  Please show the CHEMOTHERAPY ALERT CARD or  IMMUNOTHERAPY ALERT CARD at check-in to the Emergency Department and triage nurse.  Should you have questions after your visit or need to cancel or reschedule your appointment, please contact [facility]  Dept: [phone]  and follow the prompts.  Office hours are 8:00 a.m. to 4:30 p.m. Monday - Friday. Please note that voicemails left after 4:00 p.m. may not be returned until the following business day.  We are closed weekends and major holidays. You have access to a nurse at all times for urgent questions. Please call the main number to the clinic Dept: [phone] and follow the prompts.   For any non-urgent questions, you may also contact your provider using MyChart. We now offer e-Visits for anyone 60 and older to request care online for non-urgent symptoms. For details visit [email or web address].   Also download the MyChart app! Go to the app store, search "MyChart", open the app, select [facility], and log in with your MyChart username and password.  Masks are optional in the cancer centers. If you would like for your care team to wear a mask while they are taking care of you, please let them know. You may have one support person who is at least 72 years old accompany you for your appointments.

## 2021-11-27 NOTE — Assessment & Plan Note (Signed)
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 The patient denies recent history of bleeding such as epistaxis, hematuria or hematochezia. She is asymptomatic from the anemia. We will observe for now.  She does not require transfusion now. I do not recommend any further work-up at this time.   

## 2021-11-27 NOTE — Assessment & Plan Note (Signed)
I recommend elastic compression hose, frequent ambulation and leg elevation as much as possible She is retaining fluid I recommend low-dose furosemide and this is helpful We will continue the same

## 2021-11-27 NOTE — Assessment & Plan Note (Signed)
Her blood pressure is intermittently elevated She will continue current prescribed medications  I emphasized importance of hydration

## 2021-11-27 NOTE — Assessment & Plan Note (Signed)
I have reviewed her CT imaging She has excellent response to treatment After today's chemotherapy, she will proceed with interval surgery in November I plan to see her within the month after surgery to resume chemotherapy

## 2021-11-27 NOTE — Progress Notes (Signed)
[place] OFFICE PROGRESS NOTE  Patient Care Team: Levin Erp, DO as PCP - General (Family Medicine)  ASSESSMENT & PLAN:  Uterine cancer [facility] I have reviewed her CT imaging She has excellent response to treatment After today's chemotherapy, she will proceed with interval surgery in November I plan to see her within the month after surgery to resume chemotherapy  Anemia, chronic disease This is likely anemia of chronic disease. The patient denies recent history of bleeding such as epistaxis, hematuria or hematochezia. She is asymptomatic from the anemia. We will observe for now.  She does not require transfusion now. I do not recommend any further work-up at this time.    Bilateral edema of lower extremity I recommend elastic compression hose, frequent ambulation and leg elevation as much as possible She is retaining fluid I recommend low-dose furosemide and this is helpful We will continue the same  Essential hypertension Her blood pressure is intermittently elevated She will continue current prescribed medications  I emphasized importance of hydration  No orders of the defined types were placed in this encounter.   All questions were answered. The patient knows to call the clinic with any problems, questions or concerns. The total time spent in the appointment was 30 minutes encounter with patients including review of chart and various tests results, discussions about plan of care and coordination of care plan   Heath Lark, MD 11/27/2021 10:25 AM  INTERVAL HISTORY: Please see below for problem oriented charting. she returns for treatment follow-up seen prior to chemotherapy treatment #4 with her daughter Since last time I saw her, her fluid retention is stable Her bowel habits has improved; on average, she would have bowel movement 5 days in the week No recent nausea or progression of neuropathy  REVIEW OF SYSTEMS:   Constitutional: Denies fevers,  chills or abnormal weight loss Eyes: Denies blurriness of vision Ears, nose, mouth, throat, and face: Denies mucositis or sore throat Respiratory: Denies cough, dyspnea or wheezes Cardiovascular: Denies palpitation, chest discomfort  GSkin: Denies abnormal skin rashes Lymphatics: Denies new lymphadenopathy or easy bruising Neurological:Denies numbness, tingling or new weaknesses Behavioral/Psych: Mood is stable, no new changes  All other systems were reviewed with the patient and are negative.  I have reviewed the past medical history, past surgical history, social history and family history with the patient and they are unchanged from previous note.  ALLERGIES:  is allergic to hydrocodone-acetaminophen and oxycodone-acetaminophen.  MEDICATIONS:  Current Outpatient Medications  Medication Sig Dispense Refill   allopurinol (ZYLOPRIM) 300 MG tablet Take 300 mg by mouth daily.     amLODipine (NORVASC) 5 MG tablet Take 5 mg by mouth daily.     atenolol (TENORMIN) 50 MG tablet Take 50 mg by mouth daily.     atorvastatin (LIPITOR) 10 MG tablet Take 10 mg by mouth at bedtime.     dexamethasone (DECADRON) 4 MG tablet Take 2 tablets by mouth the night before and 2 tablets the morning of chemotherapy, every 3 weeks, for 6 cycles 36 tablet 6   ferrous sulfate 325 (65 FE) MG tablet Take 325 mg by mouth daily.     furosemide (LASIX) 20 MG tablet Take 1 tablet (20 mg total) by mouth daily. 30 tablet 0   lidocaine-prilocaine (EMLA) cream Apply to affected area once 30 g 3   ondansetron (ZOFRAN) 8 MG tablet Take 1 tablet by mouth every 8 hours as needed for nausea or vomiting. Start on the third day after  chemotherapy. 30 tablet 1   polyethylene glycol (MIRALAX / GLYCOLAX) 17 g packet Take 17 g by mouth 2 (two) times daily.     prochlorperazine (COMPAZINE) 10 MG tablet Take 1 tablet (10 mg total) by mouth every 6 (six) hours as needed for nausea or vomiting. 30 tablet 1   senna-docusate (SENOKOT-S)  8.6-50 MG tablet Take 2 tablets by mouth 2 (two) times daily. 30 tablet 0   traMADol (ULTRAM) 50 MG tablet Take 2 tablets (100 mg total) by mouth every 6 (six) hours as needed for severe pain. 60 tablet 0   No current facility-administered medications for this visit.    SUMMARY OF ONCOLOGIC HISTORY: Oncology History Overview Note  Outside path showed carcinosarcoma, PD-L1 positive, MSI stable   Uterine cancer ([place])  07/30/2021 Initial Diagnosis   She was evaluated for post-menopausal bleeding. She reported having been seen a year previously and having a biopsy that was negative.  On exam in clinic, she was noted to have a mass present within the vagina.  This was thought to be a prolapsing fibroid and plan was made for exam under anesthesia, removal prolapsing fibroid with hysteroscopy and D&C   08/11/2021 Pathology Results   PD-L1 [phone]):                                             Does meet FDA-approval, Combined Positive Score (CPS): >=1   Pan-TRK IHC:                                              Tumor cells are Focally Positive for TRK protein expression.   MICROSATELLITE INSTABILITY:             MS-Stable   TUMOR MUTATION BURDEN:                10.8 Muts/Mb TMB-Low   OTHER BIOMARKERS:                              MET amplification PIK3CA amplification MDM2 amplification CCNE1 amplification   Pertinent Negative Biomarkers Evaluated by NGS: ALK ATM BARD1 BRAF BRCA1 BRCA2 CDK12 CHECK1 CHEK2 EGFR  ERBB2 ESR1 FANCL FGFR2 FGFR3 IDH1 IDH2 KIT KRAS NRAS NTRK1 NTRK2 NTRK3 PALB2 PDGFRA RAD51B RAD51C  RAD51D RAD54L RET ROS1   Comment:  The above studies are performed and reported by [facility], [place]) as part of the ENDE[facility]OR (NGS) panel.  Pertinent results are summarized above.  Please see their separate reports for more detailed information  Addendum electronically signed by Elsworth Soho, MD on 09/03/2021 at  7:37 AM  Final Diagnosis    A.  CERVICAL MASS, EXCISION:       -  CONSISTENT WITH CARCINOSARCOMA (HIGH GRADE MALIGNANT BIPHASIC TUMOR)  Electronically signed by Elsworth Soho, MD on 08/15/2021 at  9:53 AM  Comment    The malignant epithelial component exhibits high-grade cytomorphology and immunostaining pattern consistent with serous carcinoma to include strong positive diffuse reactivity for p53, p16, and vimentin, focal subset CK7 and CK AE1/AE3 expression, with high Ki-67 staining (approximately 60%).  Neuroendocrine differentiation is present (CD56 negative but synaptophysin positive).  The stromal component is composed of malignant mitotically active cells  and heterologous elements with focal chondroid differentiation (S100 positive).  SMA appears to decorate rare tumor cells and highlights vascular structures.  The tumor cells are non-reactive for CK20, WT1, GATA3, p40 and desmin.  Block A5 is referred to [facility], [place]) to perform molecular characterization.  These findings are shared with Dr. Luanne Bras on [phone] at [phone] hours by telephone conversation. This case received prospective intradepartmental quality assurance review.   Clinical Information    Cervical mass  Gross Description    A. Cervix Received fresh labeled with the patient's name and date of birth and "cervical mass frozen section" per container is an aggregate of red-tan friable soft tissue measuring approximately 5 x 5 x 3 cm in toto.  Representative sections are frozen for an intraoperative consultation.  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   Results rendered by Dr. Wyatt Portela and reported to Dr. Macarthur Critchley at [facility] on 08/11/2021 at 8:06 AM.  Microscopic Description    Microscopic examination supports the above diagnosis.  Single  antibody immunostains are performed to include: S100, p40, p16, p53, PAX8, CK7, CK20, vimentin, CK AE1/3, GATA3, WT1, desmin, SMA, synaptophysin, CD56, and Ki-67.  All controls are appropriately reactive. [id number], [id number], [id number], [id number], [id number], [id number] (15)          08/22/2021 Initial Diagnosis   Carcinosarcoma ([place])   09/01/2021 Imaging   CT abdomen and pelvis There is marked enlargement of the uterus measuring 13.7 x 10 x 8 x 10.1 cm. There is inhomogeneous enhancement in the uterus consistent with malignant neoplasm.   There are enlarged lymph nodes in the retroperitoneum in the para-and paracaval regions measuring up to 12 mm in short axis. There are a few enlarged lymph nodes adjacent to the iliac vessels largest measuring 2.6 x 1.4 cm adjacent to the right external iliac vessels. Findings suggest possible metastatic lymphadenopathy. There is mild prominence of the pelvocaliceal system in the left kidney and proximal left ureter. Possibility of extrinsic compression or infiltrative process related to the uterine neoplasm in the distal course of left ureter causing mild obstruction is not excluded.   Gallbladder stone.  Diverticulosis of colon.   Other findings as described in the body of the report.   09/01/2021 Imaging   US pelvis Large central uterine ill-defined mass approximately 7.7 cm diameter enlarging uterus likely representing the biopsy-proven neoplasm, likely of endometrial origin.   Questionable intramural leiomyoma posterior mid uterus 3.1 cm diameter.   The mass extend and the adnexal regions are poorly characterized but may potentially be better evaluated by MR imaging with and without contrast.   09/10/2021 Surgery   Pre-operative Diagnosis: carcinosarcoma of the uterus, adenopathy on imaging   Post-operative Diagnosis: same, stage IVB carcinosarcoma of the uterus   Operation: Diagnostic laparoscopy    Surgeon: Jeral Pinch MD Operative Findings: On EUA, cervix 2 cm  dilated with tumor noted within the os. Cervix otherwise normal appearing. Uterus enlarged, 12 cm, with limited mobility secondary to weight. Compression of the rectum from uterus although no direct invasion appreciated. On intra-abdominal inspection, adhesions noted between the liver and the anterior abdominal wall. Evidence of carcinomatosis involving liver surface, diaphragm, omentum, anterior abdominal wall. Surface of the uterus with obvious tumor infiltration. Small bowel free from uterus. Posteriorly, sigmoid densely adherent to  the posterior uterus. Even with manipulator, unable to move the uterus anteriorly. Minimal ascites.    Given findings of stage IV disease and what would require large bowel resection and end ostomy, decision made to abort surgery in favor of neoadjuvant chemotherapy. I had discussed this as a distinct possibility when we had gotten her CT results as well as this morning prior to the surgery.    09/15/2021 Cancer Staging   Staging form: Corpus Uteri - Carcinoma and Carcinosarcoma, AJCC 8th Edition - Clinical stage from 09/15/2021: FIGO Stage IVB (cT3, cN2a, pM1) - Signed by Heath Lark, MD on 09/15/2021 Stage prefix: Initial diagnosis   09/24/2021 -  Chemotherapy   Patient is on Treatment Plan : UTERINE ENDOMETRIAL Dostarlimab-gxly (500 mg) + Carboplatin (AUC 5) + Paclitaxel (175 mg/m2) q21d x 6 cycles / Dostarlimab-gxly (1000 mg) q42d x 6 cycles      10/17/2021 Procedure   Status post revision of nonfunctional right IJ port catheter, with new right IJ port catheter placed. Port is ready for use.     11/24/2021 Imaging   1. Today's study demonstrates a positive response to therapy with partial involution of large malignant appearing uterine mass. 2. Multiple prominent borderline enlarged and mildly enlarged retroperitoneal and pelvic lymph nodes are stable to minimally decreased in size compared to the prior study, potentially metastatic. No new lymphadenopathy is noted  elsewhere in the chest, abdomen or pelvis. 3. Multiple small pulmonary nodules measuring 5 mm or less in the lungs. These were incidentally imaged on the prior CT of the abdomen and pelvis 09/01/2021, and at this time are stable. The possibility of metastatic disease is not excluded, and close attention on follow-up studies is recommended. 4. Colonic diverticulosis without evidence of acute diverticulitis at this time. 5. Additional incidental findings, as above.       PHYSICAL EXAMINATION: ECOG PERFORMANCE STATUS: 2 - Symptomatic, <50% confined to bed  Vitals:   11/27/21 1003  BP: (!) 153/87  Pulse: 77  Resp: 18  Temp: 97.9 F (36.6 C)  SpO2: 100%   Filed Weights   11/27/21 1003  Weight: 212 lb (96.2 kg)    GENERAL:alert, no distress and comfortable SKIN: skin color, texture, turgor are normal, no rashes or significant lesions EYES: normal, Conjunctiva are pink and non-injected, sclera clear OROPHARYNX:no exudate, no erythema and lips, buccal mucosa, and tongue normal  NECK: supple, thyroid normal size, non-tender, without nodularity LYMPH:  no palpable lymphadenopathy in the cervical, axillary or inguinal LUNGS: clear to auscultation and percussion with normal breathing effort HEART: regular rate & rhythm and no murmurs with mild bilateral lower extremity edema ABDOMEN:abdomen soft, non-tender and normal bowel sounds Musculoskeletal:no cyanosis of digits and no clubbing  NEURO: alert & oriented x 3 with fluent speech, no focal motor/sensory deficits  LABORATORY DATA:  I have reviewed the data as listed    Component Value Date/Time   NA 139 11/27/2021 0930   K 3.9 11/27/2021 0930   CL 104 11/27/2021 0930   CO2 26 11/27/2021 0930   GLUCOSE 118 (H) 11/27/2021 0930   BUN 11 11/27/2021 0930   CREATININE 0.64 11/27/2021 0930   CALCIUM 9.6 11/27/2021 0930   PROT 7.2 11/27/2021 0930   ALBUMIN 4.0 11/27/2021 0930   AST 17 11/27/2021 0930   ALT 8 11/27/2021 0930   ALKPHOS  53 11/27/2021 0930   BILITOT 0.5 11/27/2021 0930   GFRNONAA >60 11/27/2021 0930    No results found for: "SPEP", "UPEP"  Lab Results  Component Value Date   WBC 7.1 11/27/2021   NEUTROABS 6.0 11/27/2021   HGB 9.7 (L) 11/27/2021   HCT 31.0 (L) 11/27/2021   MCV 84.7 11/27/2021   PLT 274 11/27/2021      Chemistry      Component Value Date/Time   NA 139 11/27/2021 0930   K 3.9 11/27/2021 0930   CL 104 11/27/2021 0930   CO2 26 11/27/2021 0930   BUN 11 11/27/2021 0930   CREATININE 0.64 11/27/2021 0930      Component Value Date/Time   CALCIUM 9.6 11/27/2021 0930   ALKPHOS 53 11/27/2021 0930   AST 17 11/27/2021 0930   ALT 8 11/27/2021 0930   BILITOT 0.5 11/27/2021 0930       RADIOGRAPHIC STUDIES: I have personally reviewed the radiological images as listed and agreed with the findings in the report. CT CHEST ABDOMEN PELVIS W CONTRAST  Result Date: 11/23/2021 CLINICAL DATA:  71 year old female with history of uterine/cervical cancer. Evaluate for treatment response. * Tracking Code: BO * EXAM: CT CHEST, ABDOMEN, AND PELVIS WITH CONTRAST TECHNIQUE: Multidetector CT imaging of the chest, abdomen and pelvis was performed following the standard protocol during bolus administration of intravenous contrast. RADIATION DOSE REDUCTION: This exam was performed according to the departmental dose-optimization program which includes automated exposure control, adjustment of the mA and/or kV according to patient size and/or use of iterative reconstruction technique. CONTRAST:  161m OMNIPAQUE IOHEXOL 300 MG/ML  SOLN COMPARISON:  CT of the abdomen and pelvis 09/01/2021. FINDINGS: CT CHEST FINDINGS Cardiovascular: Heart size is normal. There is no significant pericardial fluid, thickening or pericardial calcification. Minimal atherosclerosis in the thoracic aorta. No definite coronary artery calcifications. Right internal jugular single-lumen Port-A-Cath with tip terminating at the superior  cavoatrial junction. Mediastinum/Nodes: No pathologically enlarged mediastinal or hilar lymph nodes. Esophagus is unremarkable in appearance. No axillary lymphadenopathy. Lungs/Pleura: Some clustered micronodularity with 2-3 mm pulmonary nodules in the right middle lobe, stable compared to the prior CT of the abdomen and pelvis 09/01/2021. Several other small pulmonary nodules are also noted elsewhere in the lungs measuring 5 mm or less in size, with the largest lesion in the medial aspect of the right lower lobe (axial image 69 of series 8) also unchanged compared to the prior CT of the abdomen and pelvis. In the upper lungs (regions not imaged on prior CT of the abdomen and pelvis), no pulmonary nodules or masses are noted. No acute consolidative airspace disease. No pleural effusions. Musculoskeletal: There are no aggressive appearing lytic or blastic lesions noted in the visualized portions of the skeleton. CT ABDOMEN PELVIS FINDINGS Hepatobiliary: No suspicious cystic or solid hepatic lesions. No intra or extrahepatic biliary ductal dilatation. Noncalcified gallstone measuring 1.6 cm is noted lying dependently in the fundus of the gallbladder. Gallbladder is not distended. No gallbladder wall thickening. No pericholecystic fluid or surrounding inflammatory changes. Pancreas: No pancreatic mass. No pancreatic ductal dilatation. No pancreatic or peripancreatic fluid collections or inflammatory changes. Spleen: Unremarkable. Adrenals/Urinary Tract: Subcentimeter low-attenuation lesion in the lateral aspect of the interpolar region of the right kidney, too small to characterize, but statistically likely a tiny cyst. Small parapelvic cyst also noted in the lower pole of the left kidney. No imaging follow-up is recommended for either of these lesions. No aggressive appearing renal lesions. No hydroureteronephrosis. Bilateral adrenal glands are normal in appearance. Urinary bladder is unremarkable in appearance.  Stomach/Bowel: The appearance of the stomach is normal. There is no pathologic dilatation of small bowel or  colon. A few scattered colonic diverticula are noted, without surrounding inflammatory changes to indicate an acute diverticulitis at this time. Normal appendix. Vascular/Lymphatic: Atherosclerosis in the abdominal aorta and pelvic vasculature, without evidence of aneurysm or dissection. Multiple prominent pelvic and retroperitoneal lymph nodes are again noted. Specific examples include a right obturator lymph node (axial image 101 of series 3) measuring 11 mm in short axis (previously 14 mm), right common iliac lymph node (axial image 80 of series 3) measuring 11 mm in short axis (previously 11 mm) and a retrocaval lymph node (axial image 68 of series 3) measuring 9 mm in short axis (unchanged). Reproductive: Uterus remains enlarged and again demonstrates heterogeneous malignant appearing areas of enhancement, but is overall improved compared to the prior study, currently measuring 10.6 x 7.0 x 7.8 cm. Ovaries are unremarkable in appearance. Other: Tiny umbilical hernia containing only omental fat. No significant volume of ascites. No pneumoperitoneum. Musculoskeletal: There are no aggressive appearing lytic or blastic lesions noted in the visualized portions of the skeleton. IMPRESSION: 1. Today's study demonstrates a positive response to therapy with partial involution of large malignant appearing uterine mass. 2. Multiple prominent borderline enlarged and mildly enlarged retroperitoneal and pelvic lymph nodes are stable to minimally decreased in size compared to the prior study, potentially metastatic. No new lymphadenopathy is noted elsewhere in the chest, abdomen or pelvis. 3. Multiple small pulmonary nodules measuring 5 mm or less in the lungs. These were incidentally imaged on the prior CT of the abdomen and pelvis 09/01/2021, and at this time are stable. The possibility of metastatic disease is not  excluded, and close attention on follow-up studies is recommended. 4. Colonic diverticulosis without evidence of acute diverticulitis at this time. 5. Additional incidental findings, as above. Electronically Signed   By: Vinnie Langton M.D.   On: 11/23/2021 10:54

## 2021-11-28 ENCOUNTER — Encounter (HOSPITAL_COMMUNITY)
Admission: RE | Admit: 2021-11-28 | Discharge: 2021-11-28 | Disposition: A | Discharge status: Home / Self Care | Payer: Medicare Other | Source: Ambulatory Visit | Attending: Family Medicine | Admitting: Family Medicine

## 2021-11-28 ENCOUNTER — Other Ambulatory Visit: Payer: Self-pay

## 2021-12-04 ENCOUNTER — Telehealth: Payer: Self-pay | Admitting: Radiology

## 2021-12-04 NOTE — Telephone Encounter (Signed)
A Randomized pragmatic Chair-Based Home Exercise Intervention for Mitigating Cancer-Related Fatigue in Older Adults Undergoing Chemotherapy for Advanced Disease   12/04/2021  WEEK 9 CHECK-IN PHONE CALL: Confirmed I was speaking with Sarah Wu . Informed patient reason for call is to check on status of the chair exercises for the above mentioned study. Patient states she is continuing exercises and things are going well overall. Encouraged patient and to continue and informed patient this coordinator will call in a couple of weeks to check in again and to schedule final 12 week visit.   Carol Ada, RT(R)(T) Clinical Research Coordinator

## 2021-12-16 ENCOUNTER — Other Ambulatory Visit: Payer: Self-pay

## 2021-12-18 ENCOUNTER — Other Ambulatory Visit: Payer: Medicare Other

## 2021-12-18 ENCOUNTER — Ambulatory Visit: Payer: Medicare Other | Admitting: Hematology and Oncology

## 2021-12-18 ENCOUNTER — Other Ambulatory Visit: Payer: Self-pay

## 2021-12-18 ENCOUNTER — Ambulatory Visit: Payer: Medicare Other

## 2021-12-18 ENCOUNTER — Telehealth: Payer: Self-pay | Admitting: *Deleted

## 2021-12-18 NOTE — Telephone Encounter (Signed)
A Randomized pragmatic Chair-Based Home Exercise Intervention for Mitigating Cancer-Related Fatigue in Older Adults Undergoing Chemotherapy for Advanced Disease   12/18/2021  WEEK 11 CHECK-IN PHONE CALL: Confirmed I was speaking with Sarah Wu . Informed patient reason for call is to check on status of the chair exercises for the above mentioned study. Patient states she is continuing exercises and things are going well overall. She says she is recording her exercise in the log. Encouraged patient to continue and informed patient 12 week study assessments are due next week.  Patient has appointment to see Dr. Berline Lopes next week on Friday 11/17.  Informed patient that research will provide questionnaires for her to complete when she checks in to clinic and we can complete the physical assessments after she sees the doctor. Patient verbalized understanding and agreed with this plan.  Asked patient to contact Candace if any questions before next week.   Foye Spurling, BSN, RN, [place] Nurse II [phone] 12/18/2021 1:14 PM

## 2021-12-19 ENCOUNTER — Telehealth: Payer: Self-pay | Admitting: Radiology

## 2021-12-19 NOTE — Telephone Encounter (Signed)
A Randomized pragmatic Chair-Based Home Exercise Intervention for Mitigating Cancer-Related Fatigue in Older Adults Undergoing Chemotherapy for Advanced Disease   12/19/2021  PHONE CALL: LVM for patients daughter to return call to schedule 12 week final assessment for the above mentioned study.   Carol Ada, RT(R)(T) Clinical Research Coordinator

## 2021-12-22 ENCOUNTER — Encounter: Payer: Self-pay | Admitting: Hematology and Oncology

## 2021-12-26 ENCOUNTER — Encounter: Payer: Self-pay | Admitting: Gynecologic Oncology

## 2021-12-26 ENCOUNTER — Inpatient Hospital Stay (HOSPITAL_BASED_OUTPATIENT_CLINIC_OR_DEPARTMENT_OTHER): Payer: Medicare Other | Admitting: Gynecologic Oncology

## 2021-12-26 ENCOUNTER — Inpatient Hospital Stay: Payer: Medicare Other | Admitting: Radiology

## 2021-12-26 ENCOUNTER — Inpatient Hospital Stay: Payer: Medicare Other | Attending: Gynecologic Oncology | Admitting: Gynecologic Oncology

## 2021-12-26 VITALS — BP 133/81 | HR 83 | Temp 99.3°F | Resp 18 | Wt 198.8 lb

## 2021-12-26 DIAGNOSIS — Z9221 Personal history of antineoplastic chemotherapy: Secondary | ICD-10-CM | POA: Diagnosis not present

## 2021-12-26 DIAGNOSIS — R918 Other nonspecific abnormal finding of lung field: Secondary | ICD-10-CM | POA: Diagnosis not present

## 2021-12-26 DIAGNOSIS — Z7189 Other specified counseling: Secondary | ICD-10-CM | POA: Diagnosis not present

## 2021-12-26 DIAGNOSIS — C801 Malignant (primary) neoplasm, unspecified: Secondary | ICD-10-CM

## 2021-12-26 DIAGNOSIS — K5900 Constipation, unspecified: Secondary | ICD-10-CM | POA: Insufficient documentation

## 2021-12-26 DIAGNOSIS — C549 Malignant neoplasm of corpus uteri, unspecified: Secondary | ICD-10-CM

## 2021-12-26 DIAGNOSIS — C539 Malignant neoplasm of cervix uteri, unspecified: Secondary | ICD-10-CM

## 2021-12-26 DIAGNOSIS — C55 Malignant neoplasm of uterus, part unspecified: Secondary | ICD-10-CM | POA: Insufficient documentation

## 2021-12-26 MED ORDER — BISACODYL 5 MG PO TBEC: DELAYED_RELEASE_TABLET | ORAL | 0 refills | Status: DC

## 2021-12-26 MED ORDER — TRAMADOL HCL 50 MG PO TABS: 50.0000 mg | ORAL_TABLET | Freq: Four times a day (QID) | ORAL | 0 refills | Status: DC | PRN

## 2021-12-26 MED ORDER — POLYETHYLENE GLYCOL 3350 17 GM/SCOOP PO POWD: 1.0000 | Freq: Once | ORAL | 0 refills | Status: AC

## 2021-12-26 MED ORDER — NEOMYCIN SULFATE 500 MG PO TABS: ORAL_TABLET | ORAL | 0 refills | Status: DC

## 2021-12-26 MED ORDER — SENNOSIDES-DOCUSATE SODIUM 8.6-50 MG PO TABS: 2.0000 | ORAL_TABLET | Freq: Two times a day (BID) | ORAL | 0 refills | Status: DC

## 2021-12-26 MED ORDER — ERYTHROMYCIN BASE 500 MG PO TABS: ORAL_TABLET | ORAL | 0 refills | Status: DC

## 2021-12-26 NOTE — Research (Unsigned)
A Randomized pragmatic Chair-Based Home Exercise Intervention for Mitigating Cancer-Related Fatigue in Older Adults Undergoing Chemotherapy for Advanced Disease   12/26/2021  12 WEEK VISIT: Met with patient and daughter in the research area.    Patient arrivestoday@ Accompanied by her daughter  for the 12 week visit.    PROs: Per study protocol, all PROs required for this visit were completed prior to other study activities and completeness has been verified.    PHYSICAL TESTING: Physical tests are completed by this clinical research Coordinator.  EXERCISE DIARIES: Patient did not have diaries with her, but they plan on returning them on Tuesday.    GIFT CARD: This study does provide visit compensation. 2, $25 [employer] gift cards were provided to patient. Patient signed confirmation of gift card receipt.  DISPOSITION: Upon completion off all study requirements, patient was escorted to her next appt.   The patient was thanked for their time and continued voluntary participation in this study. [name] has been provided direct contact information and is encouraged to contact this Coordinator for any needs or questions.  Carol Ada, RT(R)(T) Clinical Research Coordinator

## 2021-12-26 NOTE — H&P (View-Only) (Signed)
Gynecologic Oncology Return Clinic Visit  12/26/21  Reason for Visit: Treatment planning  Treatment History: Oncology History Overview Note  Outside path showed carcinosarcoma, PD-L1 positive, MSI stable   Uterine cancer (HCC)  07/30/2021 Initial Diagnosis   She was evaluated for post-menopausal bleeding. She reported having been seen a year previously and having a biopsy that was negative.  On exam in clinic, she was noted to have a mass present within the vagina.  This was thought to be a prolapsing fibroid and plan was made for exam under anesthesia, removal prolapsing fibroid with hysteroscopy and D&C   08/11/2021 Pathology Results   PD-L1 (22C3):                                             Does meet FDA-approval, Combined Positive Score (CPS): >=1   Pan-TRK IHC:                                              Tumor cells are Focally Positive for TRK protein expression.   MICROSATELLITE INSTABILITY:             MS-Stable   TUMOR MUTATION BURDEN:                10.8 Muts/Mb TMB-Low   OTHER BIOMARKERS:                              MET amplification PIK3CA amplification MDM2 amplification CCNE1 amplification   Pertinent Negative Biomarkers Evaluated by NGS: ALK ATM BARD1 BRAF BRCA1 BRCA2 CDK12 CHECK1 CHEK2 EGFR  ERBB2 ESR1 FANCL FGFR2 FGFR3 IDH1 IDH2 KIT KRAS NRAS NTRK1 NTRK2 NTRK3 PALB2 PDGFRA RAD51B RAD51C  RAD51D RAD54L RET ROS1   Comment:  The above studies are performed and reported by [facility] ([place], [place]) as part of the [facility] (NGS) panel.  Pertinent results are summarized above.  Please see their separate reports for more detailed information  Addendum electronically signed by Steven C Cordero, MD on 09/03/2021 at  7:37 AM  Final Diagnosis    A.  CERVICAL MASS, EXCISION:       - CONSISTENT WITH CARCINOSARCOMA (HIGH GRADE MALIGNANT BIPHASIC TUMOR)  Electronically signed by Steven C Cordero, MD on 08/15/2021 at  9:53 AM  Comment    The malignant epithelial component  exhibits high-grade cytomorphology and immunostaining pattern consistent with serous carcinoma to include strong positive diffuse reactivity for p53, p16, and vimentin, focal subset CK7 and CK AE1/AE3 expression, with high Ki-67 staining (approximately 60%).  Neuroendocrine differentiation is present (CD56 negative but synaptophysin positive).  The stromal component is composed of malignant mitotically active cells and heterologous elements with focal chondroid differentiation (S100 positive).  SMA appears to decorate rare tumor cells and highlights vascular structures.  The tumor cells are non-reactive for CK20, WT1, GATA3, p40 and desmin.  Block A5 is referred to [facility] ([place], [place]) to perform molecular characterization.  These findings are shared with Dr. Brian Kaltenecker on [id number] at 0948 hours by telephone conversation. This case received prospective intradepartmental quality assurance review.   Clinical Information    Cervical mass  Gross Description    A. Cervix Received fresh labeled with the patient's name and date of birth   and "cervical mass frozen section" per container is an aggregate of red-tan friable soft tissue measuring approximately 5 x 5 x 3 cm in toto.  Representative sections are frozen for an intraoperative consultation.  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   Results rendered by Dr. Cordero and reported to Dr. Kaltenecker at [facility] on 08/11/2021 at 8:06 AM.  Microscopic Description    Microscopic examination supports the above diagnosis.  Single antibody immunostains are performed to include: S100, p40, p16, p53, PAX8, CK7, CK20, vimentin, CK AE1/3, GATA3, WT1, desmin, SMA, synaptophysin, CD56, and Ki-67.  All controls are appropriately  reactive. 88305, 88332, 88331, 88360, 8342, 88341 (15)          08/22/2021 Initial Diagnosis   Carcinosarcoma (HCC)   09/01/2021 Imaging   CT abdomen and pelvis There is marked enlargement of the uterus measuring 13.7 x 10 x 8 x 10.1 cm. There is inhomogeneous enhancement in the uterus consistent with malignant neoplasm.   There are enlarged lymph nodes in the retroperitoneum in the para-and paracaval regions measuring up to 12 mm in short axis. There are a few enlarged lymph nodes adjacent to the iliac vessels largest measuring 2.6 x 1.4 cm adjacent to the right external iliac vessels. Findings suggest possible metastatic lymphadenopathy. There is mild prominence of the pelvocaliceal system in the left kidney and proximal left ureter. Possibility of extrinsic compression or infiltrative process related to the uterine neoplasm in the distal course of left ureter causing mild obstruction is not excluded.   Gallbladder stone.  Diverticulosis of colon.   Other findings as described in the body of the report.   09/01/2021 Imaging   US pelvis Large central uterine ill-defined mass approximately 7.7 cm diameter enlarging uterus likely representing the biopsy-proven neoplasm, likely of endometrial origin.   Questionable intramural leiomyoma posterior mid uterus 3.1 cm diameter.   The mass extend and the adnexal regions are poorly characterized but may potentially be better evaluated by MR imaging with and without contrast.   09/10/2021 Surgery   Pre-operative Diagnosis: carcinosarcoma of the uterus, adenopathy on imaging   Post-operative Diagnosis: same, stage IVB carcinosarcoma of the uterus   Operation: Diagnostic laparoscopy    Surgeon: Ramani Riva MD Operative Findings: On EUA, cervix 2 cm dilated with tumor noted within the os. Cervix otherwise normal appearing. Uterus enlarged, 12 cm, with limited mobility secondary to weight. Compression of the rectum from uterus although no  direct invasion appreciated. On intra-abdominal inspection, adhesions noted between the liver and the anterior abdominal wall. Evidence of carcinomatosis involving liver surface, diaphragm, omentum, anterior abdominal wall. Surface of the uterus with obvious tumor infiltration. Small bowel free from uterus. Posteriorly, sigmoid densely adherent to the posterior uterus. Even with manipulator, unable to move the uterus anteriorly. Minimal ascites.    Given findings of stage IV disease and what would require large bowel resection and end ostomy, decision made to abort surgery in favor of neoadjuvant chemotherapy. I had discussed this as a distinct possibility when we had gotten her CT results as well as this morning prior to the surgery.    09/15/2021 Cancer Staging   Staging form: Corpus Uteri - Carcinoma and Carcinosarcoma, AJCC 8th Edition - Clinical stage from 09/15/2021: FIGO Stage IVB (cT3, cN2a, pM1) - Signed by   Heath Lark, MD on 09/15/2021 Stage prefix: Initial diagnosis   09/24/2021 -  Chemotherapy   Patient is on Treatment Plan : UTERINE ENDOMETRIAL Dostarlimab-gxly (500 mg) + Carboplatin (AUC 5) + Paclitaxel (175 mg/m2) q21d x 6 cycles / Dostarlimab-gxly (1000 mg) q42d x 6 cycles      10/17/2021 Procedure   Status post revision of nonfunctional right IJ port catheter, with new right IJ port catheter placed. Port is ready for use.     11/24/2021 Imaging   1. Today's study demonstrates a positive response to therapy with partial involution of large malignant appearing uterine mass. 2. Multiple prominent borderline enlarged and mildly enlarged retroperitoneal and pelvic lymph nodes are stable to minimally decreased in size compared to the prior study, potentially metastatic. No new lymphadenopathy is noted elsewhere in the chest, abdomen or pelvis. 3. Multiple small pulmonary nodules measuring 5 mm or less in the lungs. These were incidentally imaged on the prior CT of the abdomen and pelvis  09/01/2021, and at this time are stable. The possibility of metastatic disease is not excluded, and close attention on follow-up studies is recommended. 4. Colonic diverticulosis without evidence of acute diverticulitis at this time. 5. Additional incidental findings, as above.      Interval History: Doing well.  Denies any abdominal or pelvic pain.  Denies any vaginal bleeding or discharge.  Endorses increased appetite.  She is now eating 3 meals a day and snacks.  Denies any nausea or emesis.  Using MiraLAX and stool softener with regular bowel function.  Denies any urinary symptoms.  Reports improvement in her energy.  Past Medical/Surgical History: Past Medical History:  Diagnosis Date   Arthritis    Fibroid tumor    Hx of mammogram 2022   [place]   Hypercholesteremia    Hypertension    Post-menopausal bleeding    Pre-diabetes     Past Surgical History:  Procedure Laterality Date   EXAMINATION UNDER ANESTHESIA  08/11/2021   with cervical mass biopsies   IR CHEST FLUORO  10/14/2021   IR IMAGING GUIDED PORT INSERTION  09/23/2021   IR IMAGING GUIDED PORT INSERTION  10/17/2021   LAPAROSCOPY N/A 09/10/2021   Procedure: DIAGNOSTIC LAPAROSCOPY;  Surgeon: Lafonda Mosses, MD;  Location: [facility];  Service: Gynecology;  Laterality: N/A;   REPLACEMENT TOTAL KNEE BILATERAL     TUBAL LIGATION      Family History  Problem Relation Age of Onset   Breast cancer Sister        half-sister   Colon cancer Neg Hx    Ovarian cancer Neg Hx    Endometrial cancer Neg Hx    Pancreatic cancer Neg Hx    Prostate cancer Neg Hx     Social History   Socioeconomic History   Marital status: Single    Spouse name: Not on file   Number of children: Not on file   Years of education: Not on file   Highest education level: Not on file  Occupational History   Not on file  Tobacco Use   Smoking status: Never    Passive exposure: Never   Smokeless tobacco: Never  Vaping Use   Vaping Use:  Never used  Substance and Sexual Activity   Alcohol use: Never   Drug use: Never   Sexual activity: Not Currently  Other Topics Concern   Not on file  Social History Narrative   Not on file   Social Determinants of Health   Financial Resource Strain:  Not on file  Food Insecurity: Food Insecurity Present (09/24/2021)   Hunger Vital Sign    Worried About Running Out of Food in the Last Year: Sometimes true    Ran Out of Food in the Last Year: Not on file  Transportation Needs: No Transportation Needs (09/24/2021)   PRAPARE - Transportation    Lack of Transportation (Medical): No    Lack of Transportation (Non-Medical): No  Physical Activity: Not on file  Stress: Not on file  Social Connections: Not on file    Current Medications:  Current Outpatient Medications:    allopurinol (ZYLOPRIM) 300 MG tablet, Take 300 mg by mouth daily., Disp: , Rfl:    amLODipine (NORVASC) 5 MG tablet, Take 5 mg by mouth at bedtime., Disp: , Rfl:    atenolol (TENORMIN) 50 MG tablet, Take 50 mg by mouth daily., Disp: , Rfl:    atorvastatin (LIPITOR) 10 MG tablet, Take 10 mg by mouth at bedtime., Disp: , Rfl:    [START ON 12/30/2021] bisacodyl (DULCOLAX) 5 MG EC tablet, At 7:00 am the day BEFORE surgery: Swallow 4 dulcolax tablets with some water, Disp: 4 tablet, Rfl: 0   dexamethasone (DECADRON) 4 MG tablet, Take 2 tablets by mouth the night before and 2 tablets the morning of chemotherapy, every 3 weeks, for 6 cycles, Disp: 36 tablet, Rfl: 6   [START ON 12/30/2021] erythromycin base (E-MYCIN) 500 MG tablet, Take 2 tablets (1000 mg total) the day BEFORE surgery at 2 pm, 3 pm, and 10 pm, Disp: 6 tablet, Rfl: 0   ferrous sulfate 325 (65 FE) MG tablet, Take 325 mg by mouth daily., Disp: , Rfl:    furosemide (LASIX) 20 MG tablet, Take 1 tablet (20 mg total) by mouth daily., Disp: 30 tablet, Rfl: 0   lidocaine-prilocaine (EMLA) cream, Apply to affected area once, Disp: 30 g, Rfl: 3   Multiple Vitamins-Minerals  (MULTIVITAMIN WITH MINERALS) tablet, Take 1 tablet by mouth at bedtime. Centrum, Disp: , Rfl:    [START ON 12/30/2021] neomycin (MYCIFRADIN) 500 MG tablet, Take 2 tablets (1000 mg total) the day BEFORE surgery at 2 pm, 3 pm, and 10 pm, Disp: 6 tablet, Rfl: 0   ondansetron (ZOFRAN) 8 MG tablet, Take 1 tablet by mouth every 8 hours as needed for nausea or vomiting. Start on the third day after chemotherapy., Disp: 30 tablet, Rfl: 1   polyethylene glycol (MIRALAX / GLYCOLAX) 17 g packet, Take 17 g by mouth daily., Disp: , Rfl:    [START ON 12/30/2021] polyethylene glycol powder (MIRALAX) 17 GM/SCOOP powder, Take 255 g by mouth once for 1 dose. For the day BEFORE surgery. Mix the entire bottle of Miralax with the 64 ounces of Gatorade. Drink this gradually (8 oz glass every 15 minutes) until gone. (You should finish in 4 hours), Disp: 255 g, Rfl: 0   prochlorperazine (COMPAZINE) 10 MG tablet, Take 1 tablet (10 mg total) by mouth every 6 (six) hours as needed for nausea or vomiting., Disp: 30 tablet, Rfl: 1   Turmeric 500 MG CAPS, Take 500 mg by mouth daily., Disp: , Rfl:    zinc gluconate 50 MG tablet, Take 50 mg by mouth daily., Disp: , Rfl:    senna-docusate (SENOKOT-S) 8.6-50 MG tablet, Take 2 tablets by mouth 2 (two) times daily. For AFTER surgery, do not take if having loose stools, Disp: 30 tablet, Rfl: 0   traMADol (ULTRAM) 50 MG tablet, Take 1-2 tablets (50-100 mg total) by mouth every 6 (six)   hours as needed for severe pain. For AFTER surgery, do not take and drive, Disp: 15 tablet, Rfl: 0  Review of Systems: + leg swelling, problem with walking Denies appetite changes, fevers, chills, fatigue, unexplained weight changes. Denies hearing loss, neck lumps or masses, mouth sores, ringing in ears or voice changes. Denies cough or wheezing.  Denies shortness of breath. Denies chest pain or palpitations.  Denies abdominal distention, pain, blood in stools, constipation, diarrhea, nausea, vomiting, or  early satiety. Denies pain with intercourse, dysuria, frequency, hematuria or incontinence. Denies hot flashes, pelvic pain, vaginal bleeding or vaginal discharge.   Denies joint pain, back pain or muscle pain/cramps. Denies itching, rash, or wounds. Denies dizziness, headaches, numbness or seizures. Denies swollen lymph nodes or glands, denies easy bruising or bleeding. Denies anxiety, depression, confusion, or decreased concentration.  Physical Exam: BP 133/81 (BP Location: Left Arm, Patient Position: Sitting)   Pulse 83   Temp 99.3 F (37.4 C)   Resp 18   Wt 198 lb 12.8 oz (90.2 kg)   SpO2 96%   BMI 30.23 kg/m  General: Alert, oriented, no acute distress.  HEENT: Normocephalic, atraumatic. Sclera anicteric.  Chest: Clear to auscultation bilaterally. No wheezes, rhonchi, or rales. Cardiovascular: Regular rate and rhythm, no murmurs, rubs, or gallops.  Abdomen: Obese. Normoactive bowel sounds. Soft, nondistended, nontender to palpation. No masses or hepatosplenomegaly appreciated. No palpable fluid wave.  Extremities: Grossly normal range of motion. Warm, well perfused. 1+ edema bilaterally.  Skin: No rashes or lesions.  Lymphatics: No cervical, supraclavicular, or inguinal adenopathy. Pelvic: Normal-appearing external female genitalia.  Mildly atrophic vaginal mucosa.  Cervix normal in appearance.  No blood or discharge within the vaginal vault.  On bimanual exam, moderately mobile uterus, significantly decreased in size, now approximately 10 cm.  Laboratory & Radiologic Studies: CT C/A/P on 10/13: IMPRESSION: 1. Today's study demonstrates a positive response to therapy with partial involution of large malignant appearing uterine mass. 2. Multiple prominent borderline enlarged and mildly enlarged retroperitoneal and pelvic lymph nodes are stable to minimally decreased in size compared to the prior study, potentially metastatic. No new lymphadenopathy is noted elsewhere in the  chest, abdomen or pelvis. 3. Multiple small pulmonary nodules measuring 5 mm or less in the lungs. These were incidentally imaged on the prior CT of the abdomen and pelvis 09/01/2021, and at this time are stable. The possibility of metastatic disease is not excluded, and close attention on follow-up studies is recommended. 4. Colonic diverticulosis without evidence of acute diverticulitis at this time. 5. Additional incidental findings, as above.  Assessment & Plan: Sarah Wu is a 71 y.o. woman with Stage IV carcinosarcoma of the uterus who presents for follow-up after 4 cycles of NACT.   Overall doing well.  Has tolerated treatment quite well.  Is feeling much better than when I initially met her.  I reviewed the findings with the patient and her daughter who accompanied her today.  We discussed positive response on CT scan.  On exam, her uterus is much smaller and more mobile.  Given findings at the time of her surgery as well as my exam today, I am recommending that we do a bowel prep before surgery in the event that she needs colon resection.  Plan for surgery is to start with diagnostic laparoscopy.  Based on intra-abdominal evaluation, we will decide on robotic versus open approach.  Surgery will include total hysterectomy, bilateral salpingo-oophorectomy, and tumor debulking.  Discussed plan for postoperative prophylactic anticoagulation.  If   done in a minimally invasive manner, plan will be for 2 weeks of anticoagulation.  If the patient requires an ex lap, we will plan on 4 weeks.  We discussed postoperative plan.  If no bowel surgery and surgery can be accomplished robotically, we will plan for observation overnight and hopefully discharge home on postoperative day #1.  If ex lap is required, the patient will likely stay at least 2-3 days.  In the setting of bowel surgery, we reviewed that her hospital stay would be longer.  We discussed the plan for a diagnostic  laparoscopy, open versus robotic assisted hysterectomy, bilateral salpingo-oophorectomy, tumor debulking, possible bowel surgery. The risks of surgery were discussed in detail and she understands these to include infection; wound separation; hernia; vaginal cuff separation, injury to adjacent organs such as bowel, bladder, blood vessels, ureters and nerves; bleeding which may require blood transfusion; anesthesia risk; thromboembolic events; possible death; unforeseen complications; possible need for re-exploration; medical complications such as heart attack, stroke, pleural effusion and pneumonia. The patient will receive DVT and antibiotic prophylaxis as indicated. She voiced a clear understanding. She had the opportunity to ask questions. Perioperative instructions were reviewed with her. Prescriptions for post-op medications were sent to her pharmacy of choice.  34 minutes of total time was spent for this patient encounter, including preparation, face-to-face counseling with the patient and coordination of care, and documentation of the encounter.  Jeral Pinch, MD  Division of Gynecologic Oncology  Department of Obstetrics and Gynecology  [facility]

## 2021-12-26 NOTE — Progress Notes (Signed)
Gynecologic Oncology Return Clinic Visit  12/26/21  Reason for Visit: Treatment planning  Treatment History: Oncology History Overview Note  Outside path showed carcinosarcoma, PD-L1 positive, MSI stable   Uterine cancer ([place])  07/30/2021 Initial Diagnosis   She was evaluated for post-menopausal bleeding. She reported having been seen a year previously and having a biopsy that was negative.  On exam in clinic, she was noted to have a mass present within the vagina.  This was thought to be a prolapsing fibroid and plan was made for exam under anesthesia, removal prolapsing fibroid with hysteroscopy and D&C   08/11/2021 Pathology Results   PD-L1 [phone]):                                             Does meet FDA-approval, Combined Positive Score (CPS): >=1   Pan-TRK IHC:                                              Tumor cells are Focally Positive for TRK protein expression.   MICROSATELLITE INSTABILITY:             MS-Stable   TUMOR MUTATION BURDEN:                10.8 Muts/Mb TMB-Low   OTHER BIOMARKERS:                              MET amplification PIK3CA amplification MDM2 amplification CCNE1 amplification   Pertinent Negative Biomarkers Evaluated by NGS: ALK ATM BARD1 BRAF BRCA1 BRCA2 CDK12 CHECK1 CHEK2 EGFR  ERBB2 ESR1 FANCL FGFR2 FGFR3 IDH1 IDH2 KIT KRAS NRAS NTRK1 NTRK2 NTRK3 PALB2 PDGFRA RAD51B RAD51C  RAD51D RAD54L RET ROS1   Comment:  The above studies are performed and reported by [facility], [place]) as part of the [employer] (NGS) panel.  Pertinent results are summarized above.  Please see their separate reports for more detailed information  Addendum electronically signed by Elsworth Soho, MD on 09/03/2021 at  7:37 AM  Final Diagnosis    A.  CERVICAL MASS, EXCISION:       - CONSISTENT WITH CARCINOSARCOMA (HIGH GRADE MALIGNANT BIPHASIC TUMOR)  Electronically signed by Elsworth Soho, MD on 08/15/2021 at  9:53 AM  Comment    The malignant epithelial component  exhibits high-grade cytomorphology and immunostaining pattern consistent with serous carcinoma to include strong positive diffuse reactivity for p53, p16, and vimentin, focal subset CK7 and CK AE1/AE3 expression, with high Ki-67 staining (approximately 60%).  Neuroendocrine differentiation is present (CD56 negative but synaptophysin positive).  The stromal component is composed of malignant mitotically active cells and heterologous elements with focal chondroid differentiation (S100 positive).  SMA appears to decorate rare tumor cells and highlights vascular structures.  The tumor cells are non-reactive for CK20, WT1, GATA3, p40 and desmin.  Block A5 is referred to [facility], [place]) to perform molecular characterization.  These findings are shared with Dr. Luanne Bras on [phone] at [phone] hours by telephone conversation. This case received prospective intradepartmental quality assurance review.   Clinical Information    Cervical mass  Gross Description    A. Cervix Received fresh labeled with the patient's name and date of birth  and "cervical mass frozen section" per container is an aggregate of red-tan friable soft tissue measuring approximately 5 x 5 x 3 cm in toto.  Representative sections are frozen for an intraoperative consultation.  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   Results rendered by Dr. Wyatt Portela and reported to Dr. Macarthur Critchley at [facility] on 08/11/2021 at 8:06 AM.  Microscopic Description    Microscopic examination supports the above diagnosis.  Single antibody immunostains are performed to include: S100, p40, p16, p53, PAX8, CK7, CK20, vimentin, CK AE1/3, GATA3, WT1, desmin, SMA, synaptophysin, CD56, and Ki-67.  All controls are appropriately  reactive. [id number], [id number], [id number], [id number], [id number], [id number] (15)          08/22/2021 Initial Diagnosis   Carcinosarcoma ([place])   09/01/2021 Imaging   CT abdomen and pelvis There is marked enlargement of the uterus measuring 13.7 x 10 x 8 x 10.1 cm. There is inhomogeneous enhancement in the uterus consistent with malignant neoplasm.   There are enlarged lymph nodes in the retroperitoneum in the para-and paracaval regions measuring up to 12 mm in short axis. There are a few enlarged lymph nodes adjacent to the iliac vessels largest measuring 2.6 x 1.4 cm adjacent to the right external iliac vessels. Findings suggest possible metastatic lymphadenopathy. There is mild prominence of the pelvocaliceal system in the left kidney and proximal left ureter. Possibility of extrinsic compression or infiltrative process related to the uterine neoplasm in the distal course of left ureter causing mild obstruction is not excluded.   Gallbladder stone.  Diverticulosis of colon.   Other findings as described in the body of the report.   09/01/2021 Imaging   US pelvis Large central uterine ill-defined mass approximately 7.7 cm diameter enlarging uterus likely representing the biopsy-proven neoplasm, likely of endometrial origin.   Questionable intramural leiomyoma posterior mid uterus 3.1 cm diameter.   The mass extend and the adnexal regions are poorly characterized but may potentially be better evaluated by MR imaging with and without contrast.   09/10/2021 Surgery   Pre-operative Diagnosis: carcinosarcoma of the uterus, adenopathy on imaging   Post-operative Diagnosis: same, stage IVB carcinosarcoma of the uterus   Operation: Diagnostic laparoscopy    Surgeon: Jeral Pinch MD Operative Findings: On EUA, cervix 2 cm dilated with tumor noted within the os. Cervix otherwise normal appearing. Uterus enlarged, 12 cm, with limited mobility secondary to weight. Compression of the rectum from uterus although no  direct invasion appreciated. On intra-abdominal inspection, adhesions noted between the liver and the anterior abdominal wall. Evidence of carcinomatosis involving liver surface, diaphragm, omentum, anterior abdominal wall. Surface of the uterus with obvious tumor infiltration. Small bowel free from uterus. Posteriorly, sigmoid densely adherent to the posterior uterus. Even with manipulator, unable to move the uterus anteriorly. Minimal ascites.    Given findings of stage IV disease and what would require large bowel resection and end ostomy, decision made to abort surgery in favor of neoadjuvant chemotherapy. I had discussed this as a distinct possibility when we had gotten her CT results as well as this morning prior to the surgery.    09/15/2021 Cancer Staging   Staging form: Corpus Uteri - Carcinoma and Carcinosarcoma, AJCC 8th Edition - Clinical stage from 09/15/2021: FIGO Stage IVB (cT3, cN2a, pM1) - Signed by  Heath Lark, MD on 09/15/2021 Stage prefix: Initial diagnosis   09/24/2021 -  Chemotherapy   Patient is on Treatment Plan : UTERINE ENDOMETRIAL Dostarlimab-gxly (500 mg) + Carboplatin (AUC 5) + Paclitaxel (175 mg/m2) q21d x 6 cycles / Dostarlimab-gxly (1000 mg) q42d x 6 cycles      10/17/2021 Procedure   Status post revision of nonfunctional right IJ port catheter, with new right IJ port catheter placed. Port is ready for use.     11/24/2021 Imaging   1. Today's study demonstrates a positive response to therapy with partial involution of large malignant appearing uterine mass. 2. Multiple prominent borderline enlarged and mildly enlarged retroperitoneal and pelvic lymph nodes are stable to minimally decreased in size compared to the prior study, potentially metastatic. No new lymphadenopathy is noted elsewhere in the chest, abdomen or pelvis. 3. Multiple small pulmonary nodules measuring 5 mm or less in the lungs. These were incidentally imaged on the prior CT of the abdomen and pelvis  09/01/2021, and at this time are stable. The possibility of metastatic disease is not excluded, and close attention on follow-up studies is recommended. 4. Colonic diverticulosis without evidence of acute diverticulitis at this time. 5. Additional incidental findings, as above.      Interval History: Doing well.  Denies any abdominal or pelvic pain.  Denies any vaginal bleeding or discharge.  Endorses increased appetite.  She is now eating 3 meals a day and snacks.  Denies any nausea or emesis.  Using MiraLAX and stool softener with regular bowel function.  Denies any urinary symptoms.  Reports improvement in her energy.  Past Medical/Surgical History: Past Medical History:  Diagnosis Date   Arthritis    Fibroid tumor    Hx of mammogram 2022   [place]   Hypercholesteremia    Hypertension    Post-menopausal bleeding    Pre-diabetes     Past Surgical History:  Procedure Laterality Date   EXAMINATION UNDER ANESTHESIA  08/11/2021   with cervical mass biopsies   IR CHEST FLUORO  10/14/2021   IR IMAGING GUIDED PORT INSERTION  09/23/2021   IR IMAGING GUIDED PORT INSERTION  10/17/2021   LAPAROSCOPY N/A 09/10/2021   Procedure: DIAGNOSTIC LAPAROSCOPY;  Surgeon: Lafonda Mosses, MD;  Location: [facility];  Service: Gynecology;  Laterality: N/A;   REPLACEMENT TOTAL KNEE BILATERAL     TUBAL LIGATION      Family History  Problem Relation Age of Onset   Breast cancer Sister        half-sister   Colon cancer Neg Hx    Ovarian cancer Neg Hx    Endometrial cancer Neg Hx    Pancreatic cancer Neg Hx    Prostate cancer Neg Hx     Social History   Socioeconomic History   Marital status: Single    Spouse name: Not on file   Number of children: Not on file   Years of education: Not on file   Highest education level: Not on file  Occupational History   Not on file  Tobacco Use   Smoking status: Never    Passive exposure: Never   Smokeless tobacco: Never  Vaping Use   Vaping Use:  Never used  Substance and Sexual Activity   Alcohol use: Never   Drug use: Never   Sexual activity: Not Currently  Other Topics Concern   Not on file  Social History Narrative   Not on file   Social Determinants of Health   Financial Resource Strain:  Not on file  Food Insecurity: Food Insecurity Present (09/24/2021)   Hunger Vital Sign    Worried About Running Out of Food in the Last Year: Sometimes true    Ran Out of Food in the Last Year: Not on file  Transportation Needs: No Transportation Needs (09/24/2021)   PRAPARE - Hydrologist (Medical): No    Lack of Transportation (Non-Medical): No  Physical Activity: Not on file  Stress: Not on file  Social Connections: Not on file    Current Medications:  Current Outpatient Medications:    allopurinol (ZYLOPRIM) 300 MG tablet, Take 300 mg by mouth daily., Disp: , Rfl:    amLODipine (NORVASC) 5 MG tablet, Take 5 mg by mouth at bedtime., Disp: , Rfl:    atenolol (TENORMIN) 50 MG tablet, Take 50 mg by mouth daily., Disp: , Rfl:    atorvastatin (LIPITOR) 10 MG tablet, Take 10 mg by mouth at bedtime., Disp: , Rfl:    [START ON 12/30/2021] bisacodyl (DULCOLAX) 5 MG EC tablet, At 7:00 am the day BEFORE surgery: Swallow 4 dulcolax tablets with some water, Disp: 4 tablet, Rfl: 0   dexamethasone (DECADRON) 4 MG tablet, Take 2 tablets by mouth the night before and 2 tablets the morning of chemotherapy, every 3 weeks, for 6 cycles, Disp: 36 tablet, Rfl: 6   [START ON 12/30/2021] erythromycin base (E-MYCIN) 500 MG tablet, Take 2 tablets (1000 mg total) the day BEFORE surgery at 2 pm, 3 pm, and 10 pm, Disp: 6 tablet, Rfl: 0   ferrous sulfate 325 (65 FE) MG tablet, Take 325 mg by mouth daily., Disp: , Rfl:    furosemide (LASIX) 20 MG tablet, Take 1 tablet (20 mg total) by mouth daily., Disp: 30 tablet, Rfl: 0   lidocaine-prilocaine (EMLA) cream, Apply to affected area once, Disp: 30 g, Rfl: 3   Multiple Vitamins-Minerals  (MULTIVITAMIN WITH MINERALS) tablet, Take 1 tablet by mouth at bedtime. Centrum, Disp: , Rfl:    [START ON 12/30/2021] neomycin (MYCIFRADIN) 500 MG tablet, Take 2 tablets (1000 mg total) the day BEFORE surgery at 2 pm, 3 pm, and 10 pm, Disp: 6 tablet, Rfl: 0   ondansetron (ZOFRAN) 8 MG tablet, Take 1 tablet by mouth every 8 hours as needed for nausea or vomiting. Start on the third day after chemotherapy., Disp: 30 tablet, Rfl: 1   polyethylene glycol (MIRALAX / GLYCOLAX) 17 g packet, Take 17 g by mouth daily., Disp: , Rfl:    [START ON 12/30/2021] polyethylene glycol powder (MIRALAX) 17 GM/SCOOP powder, Take 255 g by mouth once for 1 dose. For the day BEFORE surgery. Mix the entire bottle of Miralax with the 64 ounces of Gatorade. Drink this gradually (8 oz glass every 15 minutes) until gone. (You should finish in 4 hours), Disp: 255 g, Rfl: 0   prochlorperazine (COMPAZINE) 10 MG tablet, Take 1 tablet (10 mg total) by mouth every 6 (six) hours as needed for nausea or vomiting., Disp: 30 tablet, Rfl: 1   Turmeric 500 MG CAPS, Take 500 mg by mouth daily., Disp: , Rfl:    zinc gluconate 50 MG tablet, Take 50 mg by mouth daily., Disp: , Rfl:    senna-docusate (SENOKOT-S) 8.6-50 MG tablet, Take 2 tablets by mouth 2 (two) times daily. For AFTER surgery, do not take if having loose stools, Disp: 30 tablet, Rfl: 0   traMADol (ULTRAM) 50 MG tablet, Take 1-2 tablets (50-100 mg total) by mouth every 6 (six)  hours as needed for severe pain. For AFTER surgery, do not take and drive, Disp: 15 tablet, Rfl: 0  Review of Systems: + leg swelling, problem with walking Denies appetite changes, fevers, chills, fatigue, unexplained weight changes. Denies hearing loss, neck lumps or masses, mouth sores, ringing in ears or voice changes. Denies cough or wheezing.  Denies shortness of breath. Denies chest pain or palpitations.  Denies abdominal distention, pain, blood in stools, constipation, diarrhea, nausea, vomiting, or  early satiety. Denies pain with intercourse, dysuria, frequency, hematuria or incontinence. Denies hot flashes, pelvic pain, vaginal bleeding or vaginal discharge.   Denies joint pain, back pain or muscle pain/cramps. Denies itching, rash, or wounds. Denies dizziness, headaches, numbness or seizures. Denies swollen lymph nodes or glands, denies easy bruising or bleeding. Denies anxiety, depression, confusion, or decreased concentration.  Physical Exam: BP 133/81 (BP Location: Left Arm, Patient Position: Sitting)   Pulse 83   Temp 99.3 F (37.4 C)   Resp 18   Wt 198 lb 12.8 oz (90.2 kg)   SpO2 96%   BMI 30.23 kg/m  General: Alert, oriented, no acute distress.  HEENT: Normocephalic, atraumatic. Sclera anicteric.  Chest: Clear to auscultation bilaterally. No wheezes, rhonchi, or rales. Cardiovascular: Regular rate and rhythm, no murmurs, rubs, or gallops.  Abdomen: Obese. Normoactive bowel sounds. Soft, nondistended, nontender to palpation. No masses or hepatosplenomegaly appreciated. No palpable fluid wave.  Extremities: Grossly normal range of motion. Warm, well perfused. 1+ edema bilaterally.  Skin: No rashes or lesions.  Lymphatics: No cervical, supraclavicular, or inguinal adenopathy. Pelvic: Normal-appearing external female genitalia.  Mildly atrophic vaginal mucosa.  Cervix normal in appearance.  No blood or discharge within the vaginal vault.  On bimanual exam, moderately mobile uterus, significantly decreased in size, now approximately 10 cm.  Laboratory & Radiologic Studies: CT C/A/P on 10/13: IMPRESSION: 1. Today's study demonstrates a positive response to therapy with partial involution of large malignant appearing uterine mass. 2. Multiple prominent borderline enlarged and mildly enlarged retroperitoneal and pelvic lymph nodes are stable to minimally decreased in size compared to the prior study, potentially metastatic. No new lymphadenopathy is noted elsewhere in the  chest, abdomen or pelvis. 3. Multiple small pulmonary nodules measuring 5 mm or less in the lungs. These were incidentally imaged on the prior CT of the abdomen and pelvis 09/01/2021, and at this time are stable. The possibility of metastatic disease is not excluded, and close attention on follow-up studies is recommended. 4. Colonic diverticulosis without evidence of acute diverticulitis at this time. 5. Additional incidental findings, as above.  Assessment & Plan: Sarah Wu is a 71 y.o. woman with Stage IV carcinosarcoma of the uterus who presents for follow-up after 4 cycles of NACT.   Overall doing well.  Has tolerated treatment quite well.  Is feeling much better than when I initially met her.  I reviewed the findings with the patient and her daughter who accompanied her today.  We discussed positive response on CT scan.  On exam, her uterus is much smaller and more mobile.  Given findings at the time of her surgery as well as my exam today, I am recommending that we do a bowel prep before surgery in the event that she needs colon resection.  Plan for surgery is to start with diagnostic laparoscopy.  Based on intra-abdominal evaluation, we will decide on robotic versus open approach.  Surgery will include total hysterectomy, bilateral salpingo-oophorectomy, and tumor debulking.  Discussed plan for postoperative prophylactic anticoagulation.  If  done in a minimally invasive manner, plan will be for 2 weeks of anticoagulation.  If the patient requires an ex lap, we will plan on 4 weeks.  We discussed postoperative plan.  If no bowel surgery and surgery can be accomplished robotically, we will plan for observation overnight and hopefully discharge home on postoperative day #1.  If ex lap is required, the patient will likely stay at least 2-3 days.  In the setting of bowel surgery, we reviewed that her hospital stay would be longer.  We discussed the plan for a diagnostic  laparoscopy, open versus robotic assisted hysterectomy, bilateral salpingo-oophorectomy, tumor debulking, possible bowel surgery. The risks of surgery were discussed in detail and she understands these to include infection; wound separation; hernia; vaginal cuff separation, injury to adjacent organs such as bowel, bladder, blood vessels, ureters and nerves; bleeding which may require blood transfusion; anesthesia risk; thromboembolic events; possible death; unforeseen complications; possible need for re-exploration; medical complications such as heart attack, stroke, pleural effusion and pneumonia. The patient will receive DVT and antibiotic prophylaxis as indicated. She voiced a clear understanding. She had the opportunity to ask questions. Perioperative instructions were reviewed with her. Prescriptions for post-op medications were sent to her pharmacy of choice.  34 minutes of total time was spent for this patient encounter, including preparation, face-to-face counseling with the patient and coordination of care, and documentation of the encounter.  Jeral Pinch, MD  Division of Gynecologic Oncology  Department of Obstetrics and Gynecology  [facility]

## 2021-12-26 NOTE — Patient Instructions (Addendum)
Preparing for your Surgery  Plan for surgery on December 31, 2021 with Dr. Jeral Pinch at [facility] will be scheduled for diagnostic laparoscopy (looking into the abdomen with a camera through small incision), possible robotic assisted laparoscopic (through small incisions) versus open (through larger incision) total hysterectomy (removal of the uterus and cervix), bilateral salpingo-oophorectomy (removal of the ovaries and fallopian tubes), tumor debulking (removing any visible tumor that can safely be removed), possible bowel resection.   Pre-operative Testing -(Done on 11/21) You will receive a phone call from presurgical testing at [facility] to arrange for a pre-operative appointment and lab work.  -Bring your insurance card, copy of an advanced directive if applicable, medication list  -At that visit, you will be asked to sign a consent for a possible blood transfusion in case a transfusion becomes necessary during surgery.  The need for a blood transfusion is rare but having consent is a necessary part of your care.     -You should not be taking blood thinners or aspirin at least ten days prior to surgery unless instructed by your surgeon.  -YOU WILL NEED TO STOP TUMERIC NOW. Do not take supplements such as fish oil (omega 3), red yeast rice, turmeric before your surgery. You want to avoid medications with aspirin in them including headache powders such as BC or Goody's), Excedrin migraine.  Day Before Surgery at [facility] will have a bowel prep to follow the day before surgery. You will be advised you can have clear liquids up until 3 hours before your surgery.    AVOID GAS PRODUCING FOODS AND DRINKS including carbonated beverages (fizzy beverages, sodas).   If your bowels are filled with gas, your surgeon will have difficulty visualizing your pelvic organs which increases your surgical risks.  Your role in recovery  Plan will be for an overnight stay in  the hospital if done through small incisions. If a larger incision is made, you can anticipate a several day stay in the hospital.  Your role is to become active as soon as directed by your doctor, while still giving yourself time to heal.  Rest when you feel tired. You will be asked to do the following in order to speed your recovery:  - Cough and breathe deeply. This helps to clear and expand your lungs and can prevent pneumonia after surgery.  - [place]. Do mild physical activity. Walking or moving your legs help your circulation and body functions return to normal. Do not try to get up or walk alone the first time after surgery.   -If you develop swelling on one leg or the other, pain in the back of your leg, redness/warmth in one of your legs, please call the office or go to the Emergency Room to have a doppler to rule out a blood clot. For shortness of breath, chest pain-seek care in the Emergency Room as soon as possible. - Actively manage your pain. Managing your pain lets you move in comfort. We will ask you to rate your pain on a scale of zero to 10. It is your responsibility to tell your doctor or nurse where and how much you hurt so your pain can be treated.  Special Considerations -If you are diabetic, you may be placed on insulin after surgery to have closer control over your blood sugars to promote healing and recovery.  This does not mean that you will be discharged on insulin.  If applicable,  your oral antidiabetics will be resumed when you are tolerating a solid diet.  -Your final pathology results from surgery should be available around one week after surgery and the results will be relayed to you when available.  -Dr. Lahoma Crocker is the surgeon that assists your GYN Oncologist with surgery.  If you end up staying the night, the next day after your surgery you will either see Dr. Berline Lopes, Dr. Ernestina Patches, or Dr. Lahoma Crocker.  -FMLA forms can be faxed  to [phone] and please allow 5-7 business days for completion.  Pain Management After Surgery -You have been prescribed your pain medication and bowel regimen medications before surgery so that you can have these available when you are discharged from the hospital. The pain medication is for use ONLY AFTER surgery and a new prescription will not be given.   -Make sure that you have Tylenol IF YOU ARE ABLE TO TAKE THESE MEDICATION at home to use on a regular basis after surgery for pain control.   -Review the attached handout on narcotic use and their risks and side effects.   Bowel Regimen -You have been prescribed Sennakot-S to take nightly to prevent constipation especially if you are taking the narcotic pain medication intermittently.  It is important to prevent constipation and drink adequate amounts of liquids. You can stop taking this medication when you are not taking pain medication and you are back on your normal bowel routine.  Risks of Surgery Risks of surgery are low but include bleeding, infection, damage to surrounding structures, re-operation, blood clots, and very rarely death.   Blood Transfusion Information (For the consent to be signed before surgery)  We will be checking your blood type before surgery so in case of emergencies, we will know what type of blood you would need.                                            WHAT IS A BLOOD TRANSFUSION?  A transfusion is the replacement of blood or some of its parts. Blood is made up of multiple cells which provide different functions. Red blood cells carry oxygen and are used for blood loss replacement. White blood cells fight against infection. Platelets control bleeding. Plasma helps clot blood. Other blood products are available for specialized needs, such as hemophilia or other clotting disorders. BEFORE THE TRANSFUSION  Who gives blood for transfusions?  You may be able to donate blood to be used at a later date on  yourself (autologous donation). Relatives can be asked to donate blood. This is generally not any safer than if you have received blood from a stranger. The same precautions are taken to ensure safety when a relative's blood is donated. Healthy volunteers who are fully evaluated to make sure their blood is safe. This is blood bank blood. Transfusion therapy is the safest it has ever been in the practice of medicine. Before blood is taken from a donor, a complete history is taken to make sure that person has no history of diseases nor engages in risky social behavior (examples are intravenous drug use or sexual activity with multiple partners). The donor's travel history is screened to minimize risk of transmitting infections, such as malaria. The donated blood is tested for signs of infectious diseases, such as HIV and hepatitis. The blood is then tested to be sure it is compatible with  you in order to minimize the chance of a transfusion reaction. If you or a relative donates blood, this is often done in anticipation of surgery and is not appropriate for emergency situations. It takes many days to process the donated blood. RISKS AND COMPLICATIONS Although transfusion therapy is very safe and saves many lives, the main dangers of transfusion include:  Getting an infectious disease. Developing a transfusion reaction. This is an allergic reaction to something in the blood you were given. Every precaution is taken to prevent this. The decision to have a blood transfusion has been considered carefully by your caregiver before blood is given. Blood is not given unless the benefits outweigh the risks.  AFTER SURGERY INSTRUCTIONS  Return to work: 4-6 weeks if applicable  You may have a white honeycomb dressing over your larger incision if this is done. This dressing can be removed 5 days after surgery and you do not need to reapply a new dressing. Once you remove the dressing, you will notice that you have  the surgical glue (dermabond) on the incision and this will peel off on its own. You can get this dressing wet in the shower the days after surgery prior to removal on the 5th day.   You will also be recommended to take a blood thinner after surgery for 2 weeks if done through small incisions and 4 weeks if surgery is done through large incision. We will send in this prescription based on what type of surgery you have. THIS WOULD BE FOR AFTER SURGERY ONLY AND TO BE GIVEN AT THE SAME TIME EACH DAY. WE WILL LET YOU KNOW WHEN TO START THIS.  Activity: 1. Be up and out of the bed during the day.  Take a nap if needed.  You may walk up steps but be careful and use the hand rail.  Stair climbing will tire you more than you think, you may need to stop part way and rest.   2. No lifting or straining for 6 weeks over 10 pounds. No pushing, pulling, straining for 6 weeks.  3. No driving for 7-61 days if you were cleared to drive before surgery.  Do not drive if you are taking narcotic pain medicine and make sure that your reaction time has returned.   4. You can shower as soon as the next day after surgery. Shower daily.  Use your regular soap and water (not directly on the incision) and pat your incision(s) dry afterwards; don't rub.  No tub baths or submerging your body in water until cleared by your surgeon. If you have the soap that was given to you by pre-surgical testing that was used before surgery, you do not need to use it afterwards because this can irritate your incisions.   5. No sexual activity and nothing in the vagina for 8-10 weeks.  6. You may experience a small amount of clear drainage from your incisions, which is normal.  If the drainage persists, increases, or changes color please call the office.  7. Do not use creams, lotions, or ointments such as neosporin on your incisions after surgery until advised by your surgeon because they can cause removal of the dermabond glue on your  incisions.    8. You may experience vaginal spotting after surgery or around the 6-8 week mark from surgery when the stitches at the top of the vagina begin to dissolve.  The spotting is normal but if you experience heavy bleeding, call our office.  9.  Take Tylenol first for pain if you are able to take these medications and only use narcotic pain medication for severe pain not relieved by the Tylenol.  Monitor your Tylenol intake to a max of 4,000 mg in a 24 hour period.   Diet: 1. Low sodium Heart Healthy Diet is recommended but you are cleared to resume your normal (before surgery) diet after your procedure.  2. It is safe to use a laxative, such as Miralax or Colace, if you have difficulty moving your bowels. You have been prescribed Sennakot-S to take at bedtime every evening after surgery to keep bowel movements regular and to prevent constipation.    Wound Care: 1. Keep clean and dry.  Shower daily.  Reasons to call the Doctor: Fever - Oral temperature greater than 100.4 degrees Fahrenheit Foul-smelling vaginal discharge Difficulty urinating Nausea and vomiting Increased pain at the site of the incision that is unrelieved with pain medicine. Difficulty breathing with or without chest pain New calf pain especially if only on one side Sudden, continuing increased vaginal bleeding with or without clots.   Contacts: For questions or concerns you should contact:  Dr. Jeral Pinch at [phone]  Joylene John, NP at [phone]  After Hours: call [phone] and have the GYN Oncologist paged/contacted (after 5 pm or on the weekends).  Messages sent via mychart are for non-urgent matters and are not responded to after hours so for urgent needs, please call the after hours number.  [facility] BOWEL PREP   FIVE DAYS PRIOR TO YOUR SURGERY  Obtain supplies for the bowel prep at a pharmacy of your choice: Office sent prescription for your antibiotic pills (Neomycin and  Erythromycin)    A bottle of Miralax (238 g)- prescription sent A large bottle of Gatorade/Powerade (64 oz), avoid red/purple coloring- no prescription required Dulcolax tablets (4 tablets)- prescription sent   Change your diet to make the bowel prep go more easily: Switch to a bland, low fiber diet Stop eating any nuts, popcorn, or fruit with seeds.  Stop all fiber supplements such as Metamucil, Miralax, etc.    Improve nutrition: Consider drinking 2-3 nutritional shakes (Ex: Ensure Surgery) every day, starting 5 days prior to surgery   DAY PRIOR TO SURGERY   DO NOT TAKE LIPITOR THE DAY BEFORE SURGERY SINCE THIS CAN INTERACT WITH THE PREOPERATIVE ANTIBIOTICS  Switch to a full liquid diet the day before surgery Drink plenty of liquids all day to avoid getting dehydrated    7:00 am Swallow 4 dulcolax tablets with some water   10:00 am Mix the bottle of Miralax with the 64 ounces of Gatorade Drink the Gatorade/Miralax mixture gradually (8 oz glass every 15 minutes) until gone. (You should finish in 4 hours)  2:00pm Take 2 Neomycin '500mg'$  tablets & 2 Erythromycin '500mg'$  tablets  3:00pm Take 2 Neomycin '500mg'$  tablets & 2 Erythromycin '500mg'$  tablets  Drink plenty of clear liquids all evening to avoid getting dehydrated  10:00pm Take 2 Neomycin '500mg'$  tablets & 2 Erythromycin '500mg'$  tablets  Drink 2 Carbohydrate loading nutrition drinks (ex: Ensure Presurgery). These will be given at pre-op appointment. Do not eat anything solid after bedtime (midnight) the night before your surgery.   BUT DO drink plenty of clear liquids (Water, Gatorade, juice, soda, coffee, tea, broths, etc.) up to 3 hours prior to surgery to avoid getting dehydrated.    MORNING OF SURGERY  Remember to not to eat anything solid that morning  Drink one final carbohydrate loading nutritional drink (ex:  Ensure Presurgery) upon waking up in the morning (needs to be 3 hours before your surgery). Hold or take  medications as recommended by the hospital staff at your Preoperative visit Stop drinking liquids before you leave the house (>3 hours prior to surgery)    If you have questions or concerns, please call GYN Oncology Office at [phone] during business hours to speak to the clinical staff for advice.    WHAT DO I NEED TO KNOW ABOUT A FULL LIQUID DIET? -You may have any liquid. -You may have any food that becomes a liquid at room temperature. The food is considered a liquid if it can be poured off a spoon at room temperature. WHAT FOODS CAN I EAT? -Grains: Any grain food that can be pureed in soup (such as crackers, pasta, and rice). Hot cereal (such as farina or oatmeal) that has been blended.  -Fruits: Fruit juice, including nectars and juices. -Meats and Other Protein Sources: Eggs in custard, eggnog mix, and eggs used in ice cream or pudding. Strained meats, like in baby food, may be allowed. Consult your health care provider.  -Dairy: Milk and milk-based beverages, including milk shakes and instant breakfast mixes. Smooth yogurt. Pureed cottage cheese. Avoid these foods if they are not well tolerated. -Beverages: All beverages, including liquid nutritional supplements. AVOID CARBONATED BEVERAGES. -Condiments: Iodized salt, pepper, spices, and flavorings. Cocoa powder. Vinegar, ketchup, yellow mustard, smooth sauces (such as hollandaise, cheese sauce, or white sauce), and soy sauce. -Sweets and Desserts: Custard, smooth pudding. Flavored gelatin. Tapioca, junket. Plain ice cream, sherbet, fruit ices. Frozen ice pops, frozen fudge pops, pudding pops, and other frozen bars with cream. Syrups, including chocolate syrup. Sugar, honey, jelly.  -Fats and Oils: Margarine, butter, cream, sour cream, and oils. -Other: Broth and cream soups. Strained, broth-based soups. The items listed above may not be a complete list of recommended foods or beverages.  WHAT FOODS CAN I NOT EAT? Grains: All  breads. Grains are not allowed unless they are pureed into soup. Vegetables: No raw vegetables. Vegetables are not allowed unless they are juiced, or cooked and pureed into soup. Fruits: Fruits are not allowed unless they are juiced. Meats and Other Protein Sources: Any meat or fish. Cooked or raw eggs. Nut butters.  Dairy: Cheese.  Condiments: Stone ground mustards. Fats and Oils: Fats that are coarse or chunky. Sweets and Desserts: Ice cream or other frozen desserts that have any solids in them or on top, such as nuts, chocolate chips, and pieces of cookies. Cakes. Cookies. Candy. Others: Soups with chunks or pieces in them.

## 2021-12-29 ENCOUNTER — Encounter: Payer: Self-pay | Admitting: Hematology and Oncology

## 2021-12-29 NOTE — Patient Instructions (Addendum)
DUE TO COVID-19 ONLY TWO VISITORS  (aged 72 and older)  ARE ALLOWED TO COME WITH YOU AND STAY IN THE WAITING ROOM ONLY DURING PRE OP AND PROCEDURE.   **NO VISITORS ARE ALLOWED IN THE SHORT STAY AREA OR RECOVERY ROOM!!**  IF YOU WILL BE ADMITTED INTO THE HOSPITAL YOU ARE ALLOWED ONLY FOUR SUPPORT PEOPLE DURING VISITATION HOURS ONLY (7 AM -8PM)   The support person(s) must pass our screening, gel in and out, and wear a mask at all times, including in the patient's room. Patients must also wear a mask when staff or their support person are in the room. Visitors GUEST BADGE MUST BE WORN VISIBLY  One adult visitor may remain with you overnight and MUST be in the room by 8 P.M.     Your procedure is scheduled on: 12/31/21   Report to [facility] Main Entrance    Report to admitting at : 11:45 AM   Call this number if you have problems the morning of surgery [phone]   Do not eat food :After Midnight.   Clear liquids starting the day before surgery until : 11:00 AM DAY OF SURGERY. Drink plenty clear liquids the day before surgery.  Water Black Coffee (sugar ok, NO MILK/CREAM OR CREAMERS)  Tea (sugar ok, NO MILK/CREAM OR CREAMERS) regular and decaf                             Plain Jell-O (NO RED)                                           Fruit ices (not with fruit pulp, NO RED)                                     Popsicles (NO RED)                                                                  Juice: apple, WHITE grape, WHITE cranberry Sports drinks like Gatorade (NO RED)                The day of surgery:  Drink ONE (1) Pre-Surgery Clear Ensure or G2 at: 11:00 AM the morning of surgery. Drink in one sitting. Do not sip.  This drink was given to you during your hospital  pre-op appointment visit. Nothing else to drink after completing the  Pre-Surgery Clear Ensure or G2.          If you have questions, please contact your surgeon's office.  FOLLOW BOWEL PREP AND ANY  ADDITIONAL PRE OP INSTRUCTIONS YOU RECEIVED FROM YOUR SURGEON'S OFFICE!!!   Routine, Once, On Wed 11/26/21 at 0936, For 1 occurrence SEE BOWEL PREP INSTRUCTIONS: Clear liquids after midnight until 3 hours before surgery. Finish prescribed drink (Pre-Surgery Ensure or G2 if patient is diabetic) by 3 hours before surgery. NPO except meds with a sip of water starting 3 hours before surgery. COLON BOWEL PREP FIVE DAYS PRIOR TO YOUR SURGERY Obtain supplies for the bowel prep  at a pharmacy of your choice: Office sent prescriptions for your antibiotic pills (Neomycin & Erythromycin), Miralax, and Dulcolax 64 oz of Gatorade or Powerade- avoid red coloring Change your diet to make the bowel prep go more easily: Switch to a bland, low fiber diet Stop eating any nuts, popcorn, or fruit with seeds. Stop all fiber supplements such as Metamucil, Miralax, etc. Improve nutrition: Consider drinking 2-3 nutritional shakes (Ex: Ensure Surgery) every day, starting 5 days prior to surgery DAY PRIOR TO SURGERY Switch to a full liquid diet the day before surgery Drink plenty of liquids all day to avoid getting dehydrated 7:00 am Swallow 4 dulcolax tablets with some water   10:00 am Mix the bottle of Miralax with the 64 ounces of Gatorade Drink the Gatorade/Miralax mixture gradually (8 oz glass every 15 minutes) until gone. (You should finish in 4 hours) 2:00pm Take 2 Neomycin '500mg'$  tablets & 2 Erythromycin '500mg'$  tablets 3:00pm Take 2 Neomycin '500mg'$  tablets & 2 Erythromycin '500mg'$  tablets Drink plenty of clear liquids all evening to avoid getting dehydrated 10:00pm Take 2 Neomycin '500mg'$  tablets & 2 Erythromycin '500mg'$  tablets Drink 2 Carbohydrate loading nutrition drinks (ex: Ensure Presurgery). These will be given at pre-op appointment. Do not eat anything solid after bedtime (midnight) the night before your surgery. BUT DO drink plenty of clear liquids (Water, Gatorade, juice, soda, coffee, tea, broths, etc.) up to 3 hours prior to  surgery to avoid getting dehydrated. MORNING OF SURGERY Remember to not to eat anything solid that morning Drink one final carbohydrate loading nutritional drink (ex: Ensure Presurgery) upon waking up in the morning (needs to be 3 hours before your surgery). Hold or take medications as recommended by the hospital staff at your Preoperative visit Stop drinking liquids before you leave the house (>3 hours prior to surgery) If you have questions or concerns, please call GYN Oncology Office at [phone] during business hours to speak to the clinical staff for advice., Pre-op   Oral Hygiene is also important to reduce your risk of infection.                                    Remember - BRUSH YOUR TEETH THE MORNING OF SURGERY WITH YOUR REGULAR TOOTHPASTE   Do NOT smoke after Midnight   Take these medicines the morning of surgery with A SIP OF WATER:atenolol,amlodipine,allopurinol.                               You may not have any metal on your body including hair pins, jewelry, and body piercing             Do not wear make-up, lotions, powders, perfumes/cologne, or deodorant  Do not wear nail polish including gel and S&S, artificial/acrylic nails, or any other type of covering on natural nails including finger and toenails. If you have artificial nails, gel coating, etc. that needs to be removed by a nail salon please have this removed prior to surgery or surgery may need to be canceled/ delayed if the surgeon/ anesthesia feels like they are unable to be safely monitored.   Do not shave  48 hours prior to surgery.    Do not bring valuables to the hospital. [place].  Contacts, dentures or bridgework may not be worn into surgery.   Bring small overnight bag day of surgery.   DO NOT [place]. PHARMACY WILL DISPENSE MEDICATIONS LISTED ON YOUR MEDICATION LIST TO YOU DURING YOUR ADMISSION [place]!    Patients discharged on the day of surgery will not be allowed to drive home.  Someone NEEDS to stay with you for the first 24 hours after anesthesia.   Special Instructions: Bring a copy of your healthcare power of attorney and living will documents         the day of surgery if you haven't scanned them before.              Please read over the following fact sheets you were given: IF YOU HAVE QUESTIONS ABOUT YOUR PRE-OP INSTRUCTIONS PLEASE CALL [phone]    [facility] - Preparing for Surgery Before surgery, you can play an important role.  Because skin is not sterile, your skin needs to be as free of germs as possible.  You can reduce the number of germs on your skin by washing with CHG (chlorahexidine gluconate) soap before surgery.  CHG is an antiseptic cleaner which kills germs and bonds with the skin to continue killing germs even after washing. Please DO NOT use if you have an allergy to CHG or antibacterial soaps.  If your skin becomes reddened/irritated stop using the CHG and inform your nurse when you arrive at Short Stay. Do not shave (including legs and underarms) for at least 48 hours prior to the first CHG shower.  You may shave your face/neck. Please follow these instructions carefully:  1.  Shower with CHG Soap the night before surgery and the  morning of Surgery.  2.  If you choose to wash your hair, wash your hair first as usual with your  normal  shampoo.  3.  After you shampoo, rinse your hair and body thoroughly to remove the  shampoo.                           4.  Use CHG as you would any other liquid soap.  You can apply chg directly  to the skin and wash                       Gently with a scrungie or clean washcloth.  5.  Apply the CHG Soap to your body ONLY FROM THE NECK DOWN.   Do not use on face/ open                           Wound or open sores. Avoid contact with eyes, ears mouth and genitals (private parts).                       Wash face,  Genitals  (private parts) with your normal soap.             6.  Wash thoroughly, paying special attention to the area where your surgery  will be performed.  7.  Thoroughly rinse your body with warm water from the neck down.  8.  DO NOT shower/wash with your normal soap after using and rinsing off  the CHG Soap.                9.  Pat yourself dry with a  clean towel.            10.  Wear clean pajamas.            11.  Place clean sheets on your bed the night of your first shower and do not  sleep with pets. Day of Surgery : Do not apply any lotions/deodorants the morning of surgery.  Please wear clean clothes to the hospital/surgery center.  FAILURE TO FOLLOW THESE INSTRUCTIONS MAY RESULT IN THE CANCELLATION OF YOUR SURGERY PATIENT SIGNATURE_________________________________  NURSE SIGNATURE__________________________________  ________________________________________________________________________

## 2021-12-30 ENCOUNTER — Encounter (HOSPITAL_COMMUNITY): Payer: Self-pay

## 2021-12-30 ENCOUNTER — Other Ambulatory Visit: Payer: Self-pay

## 2021-12-30 ENCOUNTER — Encounter (HOSPITAL_COMMUNITY)
Admission: RE | Admit: 2021-12-30 | Discharge: 2021-12-30 | Disposition: A | Discharge status: Home / Self Care | Payer: Medicare Other | Source: Ambulatory Visit | Attending: Gynecologic Oncology | Admitting: Gynecologic Oncology

## 2021-12-30 ENCOUNTER — Telehealth: Payer: Self-pay

## 2021-12-30 ENCOUNTER — Encounter: Payer: Self-pay | Admitting: Hematology and Oncology

## 2021-12-30 VITALS — BP 140/90 | HR 75 | Temp 98.4°F | Ht 68.0 in | Wt 200.0 lb

## 2021-12-30 DIAGNOSIS — C549 Malignant neoplasm of corpus uteri, unspecified: Secondary | ICD-10-CM | POA: Insufficient documentation

## 2021-12-30 DIAGNOSIS — R7303 Prediabetes: Secondary | ICD-10-CM | POA: Insufficient documentation

## 2021-12-30 DIAGNOSIS — Z01812 Encounter for preprocedural laboratory examination: Secondary | ICD-10-CM | POA: Insufficient documentation

## 2021-12-30 HISTORY — DX: Malignant (primary) neoplasm, unspecified: C80.1

## 2021-12-30 LAB — CBC
HCT: 34.9 % — ABNORMAL LOW (ref 36.0–46.0)
Hemoglobin: 10.6 g/dL — ABNORMAL LOW (ref 12.0–15.0)
MCH: 27.7 pg (ref 26.0–34.0)
MCHC: 30.4 g/dL (ref 30.0–36.0)
MCV: 91.4 fL (ref 80.0–100.0)
Platelets: 287 10*3/uL (ref 150–400)
RBC: 3.82 MIL/uL — ABNORMAL LOW (ref 3.87–5.11)
RDW: 20.2 % — ABNORMAL HIGH (ref 11.5–15.5)
WBC: 5.6 10*3/uL (ref 4.0–10.5)
nRBC: 0 % (ref 0.0–0.2)

## 2021-12-30 LAB — COMPREHENSIVE METABOLIC PANEL
ALT: 8 U/L (ref 0–44)
AST: 21 U/L (ref 15–41)
Albumin: 3.8 g/dL (ref 3.5–5.0)
Alkaline Phosphatase: 52 U/L (ref 38–126)
Anion gap: 9 (ref 5–15)
BUN: 13 mg/dL (ref 8–23)
CO2: 28 mmol/L (ref 22–32)
Calcium: 9.3 mg/dL (ref 8.9–10.3)
Chloride: 101 mmol/L (ref 98–111)
Creatinine, Ser: 0.64 mg/dL (ref 0.44–1.00)
GFR, Estimated: >60 mL/min (ref >60)
Glucose, Bld: 99 mg/dL (ref 70–99)
Potassium: 3.4 mmol/L — ABNORMAL LOW (ref 3.5–5.1)
Sodium: 138 mmol/L (ref 135–145)
Total Bilirubin: 0.6 mg/dL (ref 0.3–1.2)
Total Protein: 7.5 g/dL (ref 6.5–8.1)

## 2021-12-30 LAB — GLUCOSE, CAPILLARY: Glucose-Capillary: 89 mg/dL (ref 70–99)

## 2021-12-30 LAB — HEMOGLOBIN A1C
Hgb A1c MFr Bld: 5.4 % (ref 4.8–5.6)
Mean Plasma Glucose: 108.28 mg/dL

## 2021-12-30 NOTE — Telephone Encounter (Signed)
Telephone call to check on pre-operative status.  I spoke to Ms.Enslow's daughter Heath Lark. Patient compliant with pre-operative instructions.  Reinforced nothing to eat after midnight. Clear liquids until 11:00. Patient to arrive at 11:45.  No questions or concerns voiced.  Instructed to call for any needs.  Robbie aware, per Joylene John NP, pt's potassium was a little low (3.4), can add a banana or two to diet. She voiced an understanding and will let her mom know.

## 2021-12-30 NOTE — Progress Notes (Signed)
For Short Stay: [place] appointment date:  Bowel Prep reminder:   For Anesthesia: PCP - DO: [name] Cardiologist -   Chest x-ray - CT Chest: 11/23/21 EKG - 09/01/21 Stress Test -  ECHO -  Cardiac Cath -  Pacemaker/ICD device last checked: Pacemaker orders received: Device Rep notified:  Spinal Cord Stimulator:  Sleep Study -  CPAP -   Fasting Blood Sugar - N/A Checks Blood Sugar __0___ times a day Date and result of last Hgb A1c-  Last dose of GLP1 agonist-  GLP1 instructions:   Last dose of SGLT-2 inhibitors-  SGLT-2 instructions:   Blood Thinner Instructions: Aspirin Instructions: Last Dose:  Activity level: Can go up a flight of stairs and activities of daily living without stopping and without chest pain and/or shortness of breath   Able to exercise without chest pain and/or shortness of breath   Unable to go up a flight of stairs without chest pain and/or shortness of breath     Anesthesia review: HTN,Pre-DIA.  Patient denies shortness of breath, fever, cough and chest pain at PAT appointment   Patient verbalized understanding of instructions that were given to them at the PAT appointment. Patient was also instructed that they will need to review over the PAT instructions again at home before surgery.

## 2021-12-30 NOTE — Progress Notes (Signed)
Patient here with her daughter for follow up with Dr. Berline Lopes and for a pre-operative appointment prior to her scheduled surgery on December 31, 2021. She is scheduled for diagnostic laparoscopy, possible robotic assisted laparoscopic versus open total hysterectomy, bilateral salpingo-oophorectomy, tumor debulking, possible bowel resection.  She has her pre-admission testing appointment on 11/21 at [facility].  The surgery was discussed in detail.  See after visit summary for additional details. Visual aids used to discuss items related to surgery.   Discussed post-op pain management in detail including the aspects of the enhanced recovery pathway.  Advised her that a new prescription would be sent in for tramadol and it is only to be used for after her upcoming surgery.  We discussed the use of tylenol post-op and to monitor for a maximum of 4,000 mg in a 24 hour period.  Also prescribed sennakot to be used after surgery and to hold if having loose stools.  Discussed bowel regimen in detail.     Discussed measures to take at home to prevent DVT including frequent mobility.  Reportable signs and symptoms of DVT discussed. Post-operative instructions discussed and expectations for after surgery. Incisional care discussed as well including reportable signs and symptoms including erythema, drainage, wound separation.     10 minutes spent with the patient.  Verbalizing understanding of material discussed. No needs or concerns voiced at the end of the visit.   Advised patient and family to call for any needs.  Advised that her pre-op bowel prep and post-operative medications had been prescribed and could be picked up at any time.    This appointment is included in the global surgical bundle as pre-operative teaching and has no charge.

## 2021-12-30 NOTE — Patient Instructions (Signed)
Preparing for your Surgery   Plan for surgery on December 31, 2021 with Dr. Jeral Pinch at [place] will be scheduled for diagnostic laparoscopy (looking into the abdomen with a camera through small incision), possible robotic assisted laparoscopic (through small incisions) versus open (through larger incision) total hysterectomy (removal of the uterus and cervix), bilateral salpingo-oophorectomy (removal of the ovaries and fallopian tubes), tumor debulking (removing any visible tumor that can safely be removed), possible bowel resection.    Pre-operative Testing -(Done on 11/21) You will receive a phone call from presurgical testing at [facility] to arrange for a pre-operative appointment and lab work.   -Bring your insurance card, copy of an advanced directive if applicable, medication list   -At that visit, you will be asked to sign a consent for a possible blood transfusion in case a transfusion becomes necessary during surgery.  The need for a blood transfusion is rare but having consent is a necessary part of your care.      -You should not be taking blood thinners or aspirin at least ten days prior to surgery unless instructed by your surgeon.   -YOU WILL NEED TO STOP TUMERIC NOW. Do not take supplements such as fish oil (omega 3), red yeast rice, turmeric before your surgery. You want to avoid medications with aspirin in them including headache powders such as BC or Goody's), Excedrin migraine.   Day Before Surgery at [place] will have a bowel prep to follow the day before surgery. You will be advised you can have clear liquids up until 3 hours before your surgery.     AVOID GAS PRODUCING FOODS AND DRINKS including carbonated beverages (fizzy beverages, sodas).    If your bowels are filled with gas, your surgeon will have difficulty visualizing your pelvic organs which increases your surgical risks.   Your role in recovery   Plan will be for an  overnight stay in the hospital if done through small incisions. If a larger incision is made, you can anticipate a several day stay in the hospital.   Your role is to become active as soon as directed by your doctor, while still giving yourself time to heal.  Rest when you feel tired. You will be asked to do the following in order to speed your recovery:   - Cough and breathe deeply. This helps to clear and expand your lungs and can prevent pneumonia after surgery.  - [place]. Do mild physical activity. Walking or moving your legs help your circulation and body functions return to normal. Do not try to get up or walk alone the first time after surgery.   -If you develop swelling on one leg or the other, pain in the back of your leg, redness/warmth in one of your legs, please call the office or go to the Emergency Room to have a doppler to rule out a blood clot. For shortness of breath, chest pain-seek care in the Emergency Room as soon as possible. - Actively manage your pain. Managing your pain lets you move in comfort. We will ask you to rate your pain on a scale of zero to 10. It is your responsibility to tell your doctor or nurse where and how much you hurt so your pain can be treated.   Special Considerations -If you are diabetic, you may be placed on insulin after surgery to have closer control over your blood sugars to promote healing and recovery.  This does not mean that you will be discharged on insulin.  If applicable, your oral antidiabetics will be resumed when you are tolerating a solid diet.   -Your final pathology results from surgery should be available around one week after surgery and the results will be relayed to you when available.   -Dr. Lahoma Crocker is the surgeon that assists your GYN Oncologist with surgery.  If you end up staying the night, the next day after your surgery you will either see Dr. Berline Lopes, Dr. Ernestina Patches, or Dr. Lahoma Crocker.    -FMLA forms can be faxed to [phone] and please allow 5-7 business days for completion.   Pain Management After Surgery -You have been prescribed your pain medication and bowel regimen medications before surgery so that you can have these available when you are discharged from the hospital. The pain medication is for use ONLY AFTER surgery and a new prescription will not be given.    -Make sure that you have Tylenol IF YOU ARE ABLE TO TAKE THESE MEDICATION at home to use on a regular basis after surgery for pain control.    -Review the attached handout on narcotic use and their risks and side effects.    Bowel Regimen -You have been prescribed Sennakot-S to take nightly to prevent constipation especially if you are taking the narcotic pain medication intermittently.  It is important to prevent constipation and drink adequate amounts of liquids. You can stop taking this medication when you are not taking pain medication and you are back on your normal bowel routine.   Risks of Surgery Risks of surgery are low but include bleeding, infection, damage to surrounding structures, re-operation, blood clots, and very rarely death.     Blood Transfusion Information (For the consent to be signed before surgery)   We will be checking your blood type before surgery so in case of emergencies, we will know what type of blood you would need.                                             WHAT IS A BLOOD TRANSFUSION?   A transfusion is the replacement of blood or some of its parts. Blood is made up of multiple cells which provide different functions. Red blood cells carry oxygen and are used for blood loss replacement. White blood cells fight against infection. Platelets control bleeding. Plasma helps clot blood. Other blood products are available for specialized needs, such as hemophilia or other clotting disorders. BEFORE THE TRANSFUSION  Who gives blood for transfusions?  You may be able to  donate blood to be used at a later date on yourself (autologous donation). Relatives can be asked to donate blood. This is generally not any safer than if you have received blood from a stranger. The same precautions are taken to ensure safety when a relative's blood is donated. Healthy volunteers who are fully evaluated to make sure their blood is safe. This is blood bank blood. Transfusion therapy is the safest it has ever been in the practice of medicine. Before blood is taken from a donor, a complete history is taken to make sure that person has no history of diseases nor engages in risky social behavior (examples are intravenous drug use or sexual activity with multiple partners). The donor's travel history is screened to minimize risk of transmitting infections, such as malaria.  The donated blood is tested for signs of infectious diseases, such as HIV and hepatitis. The blood is then tested to be sure it is compatible with you in order to minimize the chance of a transfusion reaction. If you or a relative donates blood, this is often done in anticipation of surgery and is not appropriate for emergency situations. It takes many days to process the donated blood. RISKS AND COMPLICATIONS Although transfusion therapy is very safe and saves many lives, the main dangers of transfusion include:  Getting an infectious disease. Developing a transfusion reaction. This is an allergic reaction to something in the blood you were given. Every precaution is taken to prevent this. The decision to have a blood transfusion has been considered carefully by your caregiver before blood is given. Blood is not given unless the benefits outweigh the risks.   AFTER SURGERY INSTRUCTIONS   Return to work: 4-6 weeks if applicable   You may have a white honeycomb dressing over your larger incision if this is done. This dressing can be removed 5 days after surgery and you do not need to reapply a new dressing. Once you remove  the dressing, you will notice that you have the surgical glue (dermabond) on the incision and this will peel off on its own. You can get this dressing wet in the shower the days after surgery prior to removal on the 5th day.    You will also be recommended to take a blood thinner after surgery for 2 weeks if done through small incisions and 4 weeks if surgery is done through large incision. We will send in this prescription based on what type of surgery you have. THIS WOULD BE FOR AFTER SURGERY ONLY AND TO BE GIVEN AT THE SAME TIME EACH DAY. WE WILL LET YOU KNOW WHEN TO START THIS.   Activity: 1. Be up and out of the bed during the day.  Take a nap if needed.  You may walk up steps but be careful and use the hand rail.  Stair climbing will tire you more than you think, you may need to stop part way and rest.    2. No lifting or straining for 6 weeks over 10 pounds. No pushing, pulling, straining for 6 weeks.   3. No driving for 6-07 days if you were cleared to drive before surgery.  Do not drive if you are taking narcotic pain medicine and make sure that your reaction time has returned.    4. You can shower as soon as the next day after surgery. Shower daily.  Use your regular soap and water (not directly on the incision) and pat your incision(s) dry afterwards; don't rub.  No tub baths or submerging your body in water until cleared by your surgeon. If you have the soap that was given to you by pre-surgical testing that was used before surgery, you do not need to use it afterwards because this can irritate your incisions.    5. No sexual activity and nothing in the vagina for 8-10 weeks.   6. You may experience a small amount of clear drainage from your incisions, which is normal.  If the drainage persists, increases, or changes color please call the office.   7. Do not use creams, lotions, or ointments such as neosporin on your incisions after surgery until advised by your surgeon because they can  cause removal of the dermabond glue on your incisions.     8. You may experience vaginal  spotting after surgery or around the 6-8 week mark from surgery when the stitches at the top of the vagina begin to dissolve.  The spotting is normal but if you experience heavy bleeding, call our office.   9. Take Tylenol first for pain if you are able to take these medications and only use narcotic pain medication for severe pain not relieved by the Tylenol.  Monitor your Tylenol intake to a max of 4,000 mg in a 24 hour period.    Diet: 1. Low sodium Heart Healthy Diet is recommended but you are cleared to resume your normal (before surgery) diet after your procedure.   2. It is safe to use a laxative, such as Miralax or Colace, if you have difficulty moving your bowels. You have been prescribed Sennakot-S to take at bedtime every evening after surgery to keep bowel movements regular and to prevent constipation.     Wound Care: 1. Keep clean and dry.  Shower daily.   Reasons to call the Doctor: Fever - Oral temperature greater than 100.4 degrees Fahrenheit Foul-smelling vaginal discharge Difficulty urinating Nausea and vomiting Increased pain at the site of the incision that is unrelieved with pain medicine. Difficulty breathing with or without chest pain New calf pain especially if only on one side Sudden, continuing increased vaginal bleeding with or without clots.   Contacts: For questions or concerns you should contact:   Dr. Jeral Pinch at [phone]   Joylene John, NP at [phone]   After Hours: call [phone] and have the GYN Oncologist paged/contacted (after 5 pm or on the weekends).   Messages sent via mychart are for non-urgent matters and are not responded to after hours so for urgent needs, please call the after hours number.   [facility] BOWEL PREP     FIVE DAYS PRIOR TO YOUR SURGERY   Obtain supplies for the bowel prep at a pharmacy of your choice: Office sent  prescription for your antibiotic pills (Neomycin and Erythromycin)              A bottle of Miralax (238 g)- prescription sent A large bottle of Gatorade/Powerade (64 oz), avoid red/purple coloring- no prescription required Dulcolax tablets (4 tablets)- prescription sent     Change your diet to make the bowel prep go more easily: Switch to a bland, low fiber diet Stop eating any nuts, popcorn, or fruit with seeds.  Stop all fiber supplements such as Metamucil, Miralax, etc.     Improve nutrition: Consider drinking 2-3 nutritional shakes (Ex: Ensure Surgery) every day, starting 5 days prior to surgery     DAY PRIOR TO SURGERY    DO NOT TAKE LIPITOR THE DAY BEFORE SURGERY SINCE THIS CAN INTERACT WITH THE PREOPERATIVE ANTIBIOTICS   Switch to a full liquid diet the day before surgery Drink plenty of liquids all day to avoid getting dehydrated      7:00 am Swallow 4 dulcolax tablets with some water   10:00 am Mix the bottle of Miralax with the 64 ounces of Gatorade Drink the Gatorade/Miralax mixture gradually (8 oz glass every 15 minutes) until gone. (You should finish in 4 hours)   2:00pm Take 2 Neomycin '500mg'$  tablets & 2 Erythromycin '500mg'$  tablets   3:00pm Take 2 Neomycin '500mg'$  tablets & 2 Erythromycin '500mg'$  tablets  Drink plenty of clear liquids all evening to avoid getting dehydrated   10:00pm Take 2 Neomycin '500mg'$  tablets & 2 Erythromycin '500mg'$  tablets  Drink 2 Carbohydrate loading nutrition drinks (ex: Ensure  Presurgery). These will be given at pre-op appointment. Do not eat anything solid after bedtime (midnight) the night before your surgery.   BUT DO drink plenty of clear liquids (Water, Gatorade, juice, soda, coffee, tea, broths, etc.) up to 3 hours prior to surgery to avoid getting dehydrated.      MORNING OF SURGERY   Remember to not to eat anything solid that morning  Drink one final carbohydrate loading nutritional drink (ex: Ensure Presurgery) upon waking up  in the morning (needs to be 3 hours before your surgery). Hold or take medications as recommended by the hospital staff at your Preoperative visit Stop drinking liquids before you leave the house (>3 hours prior to surgery)       If you have questions or concerns, please call GYN Oncology Office at [phone] during business hours to speak to the clinical staff for advice.       WHAT DO I NEED TO KNOW ABOUT A FULL LIQUID DIET? -You may have any liquid. -You may have any food that becomes a liquid at room temperature. The food is considered a liquid if it can be poured off a spoon at room temperature. WHAT FOODS CAN I EAT? -Grains: Any grain food that can be pureed in soup (such as crackers, pasta, and rice). Hot cereal (such as farina or oatmeal) that has been blended.  -Fruits: Fruit juice, including nectars and juices. -Meats and Other Protein Sources: Eggs in custard, eggnog mix, and eggs used in ice cream or pudding. Strained meats, like in baby food, may be allowed. Consult your health care provider.  -Dairy: Milk and milk-based beverages, including milk shakes and instant breakfast mixes. Smooth yogurt. Pureed cottage cheese. Avoid these foods if they are not well tolerated. -Beverages: All beverages, including liquid nutritional supplements. AVOID CARBONATED BEVERAGES. -Condiments: Iodized salt, pepper, spices, and flavorings. Cocoa powder. Vinegar, ketchup, yellow mustard, smooth sauces (such as hollandaise, cheese sauce, or white sauce), and soy sauce. -Sweets and Desserts: Custard, smooth pudding. Flavored gelatin. Tapioca, junket. Plain ice cream, sherbet, fruit ices. Frozen ice pops, frozen fudge pops, pudding pops, and other frozen bars with cream. Syrups, including chocolate syrup. Sugar, honey, jelly.  -Fats and Oils: Margarine, butter, cream, sour cream, and oils. -Other: Broth and cream soups. Strained, broth-based soups. The items listed above may not be a complete list  of recommended foods or beverages.  WHAT FOODS CAN I NOT EAT? Grains: All breads. Grains are not allowed unless they are pureed into soup. Vegetables: No raw vegetables. Vegetables are not allowed unless they are juiced, or cooked and pureed into soup. Fruits: Fruits are not allowed unless they are juiced. Meats and Other Protein Sources: Any meat or fish. Cooked or raw eggs. Nut butters.  Dairy: Cheese.  Condiments: Stone ground mustards. Fats and Oils: Fats that are coarse or chunky. Sweets and Desserts: Ice cream or other frozen desserts that have any solids in them or on top, such as nuts, chocolate chips, and pieces of cookies. Cakes. Cookies. Candy. Others: Soups with chunks or pieces in them.

## 2021-12-31 ENCOUNTER — Encounter (HOSPITAL_COMMUNITY): Payer: Self-pay | Admitting: Gynecologic Oncology

## 2021-12-31 ENCOUNTER — Other Ambulatory Visit: Payer: Self-pay

## 2021-12-31 ENCOUNTER — Inpatient Hospital Stay (HOSPITAL_COMMUNITY): Payer: Medicare Other | Admitting: Certified Registered"

## 2021-12-31 ENCOUNTER — Inpatient Hospital Stay (HOSPITAL_COMMUNITY)
Admission: RE | Admit: 2021-12-31 | Discharge: 2022-01-01 | DRG: 739 | Disposition: A | Discharge status: Home / Self Care | Payer: Medicare Other | Attending: Gynecologic Oncology | Admitting: Gynecologic Oncology

## 2021-12-31 ENCOUNTER — Encounter (HOSPITAL_COMMUNITY)
Admission: RE | Disposition: A | Discharge status: Home / Self Care | Payer: Self-pay | Source: Home / Self Care | Attending: Gynecologic Oncology

## 2021-12-31 DIAGNOSIS — N852 Hypertrophy of uterus: Secondary | ICD-10-CM | POA: Diagnosis present

## 2021-12-31 DIAGNOSIS — I10 Essential (primary) hypertension: Secondary | ICD-10-CM | POA: Diagnosis not present

## 2021-12-31 DIAGNOSIS — M1990 Unspecified osteoarthritis, unspecified site: Secondary | ICD-10-CM | POA: Diagnosis present

## 2021-12-31 DIAGNOSIS — Z885 Allergy status to narcotic agent status: Secondary | ICD-10-CM

## 2021-12-31 DIAGNOSIS — Z96653 Presence of artificial knee joint, bilateral: Secondary | ICD-10-CM | POA: Diagnosis present

## 2021-12-31 DIAGNOSIS — C55 Malignant neoplasm of uterus, part unspecified: Principal | ICD-10-CM | POA: Diagnosis present

## 2021-12-31 DIAGNOSIS — K682 Retroperitoneal fibrosis: Secondary | ICD-10-CM | POA: Diagnosis not present

## 2021-12-31 DIAGNOSIS — Z803 Family history of malignant neoplasm of breast: Secondary | ICD-10-CM

## 2021-12-31 DIAGNOSIS — Z79899 Other long term (current) drug therapy: Secondary | ICD-10-CM

## 2021-12-31 DIAGNOSIS — C549 Malignant neoplasm of corpus uteri, unspecified: Principal | ICD-10-CM

## 2021-12-31 DIAGNOSIS — R7303 Prediabetes: Secondary | ICD-10-CM | POA: Diagnosis present

## 2021-12-31 DIAGNOSIS — N736 Female pelvic peritoneal adhesions (postinfective): Secondary | ICD-10-CM | POA: Diagnosis present

## 2021-12-31 DIAGNOSIS — C541 Malignant neoplasm of endometrium: Secondary | ICD-10-CM | POA: Diagnosis present

## 2021-12-31 DIAGNOSIS — E7800 Pure hypercholesterolemia, unspecified: Secondary | ICD-10-CM | POA: Diagnosis present

## 2021-12-31 LAB — TYPE AND SCREEN
ABO/RH(D): A POS
Antibody Screen: NEGATIVE

## 2021-12-31 SURGERY — XI ROBOTIC ASSISTED TOTAL HYSTERECTOMY BILATERAL SALPINGO OOPHORECTOMY WITH OMENTECTOMY AND DEBULKING
Anesthesia: General | Site: Abdomen | Laterality: Bilateral

## 2021-12-31 MED ORDER — LACTATED RINGERS IV SOLN: INTRAVENOUS | Status: DC | PRN

## 2021-12-31 MED ORDER — SUGAMMADEX SODIUM 200 MG/2ML IV SOLN
INTRAVENOUS | Status: DC | PRN
  Administered 2021-12-31: 200 mg via INTRAVENOUS

## 2021-12-31 MED ORDER — SURGIFLO WITH THROMBIN (HEMOSTATIC MATRIX KIT) OPTIME
TOPICAL | Status: DC | PRN
  Administered 2021-12-31: 1 via TOPICAL

## 2021-12-31 MED ORDER — ENOXAPARIN SODIUM 40 MG/0.4ML IJ SOSY
40.0000 mg | PREFILLED_SYRINGE | INTRAMUSCULAR | Status: DC
  Administered 2022-01-01: 40 mg via SUBCUTANEOUS
  Filled 2021-12-31: qty 0.4

## 2021-12-31 MED ORDER — PROMETHAZINE HCL 25 MG/ML IJ SOLN: 6.2500 mg | INTRAMUSCULAR | Status: DC | PRN

## 2021-12-31 MED ORDER — ATORVASTATIN CALCIUM 10 MG PO TABS
10.0000 mg | ORAL_TABLET | Freq: Every day | ORAL | Status: DC
  Administered 2021-12-31: 10 mg via ORAL
  Filled 2021-12-31: qty 1

## 2021-12-31 MED ORDER — ONDANSETRON HCL 4 MG/2ML IJ SOLN
INTRAMUSCULAR | Status: DC | PRN
  Administered 2021-12-31: 4 mg via INTRAVENOUS

## 2021-12-31 MED ORDER — ACETAMINOPHEN 500 MG PO TABS
ORAL_TABLET | ORAL | Status: AC
  Administered 2021-12-31: 1000 mg via ORAL
  Filled 2021-12-31: qty 2

## 2021-12-31 MED ORDER — SODIUM CHLORIDE 0.9 % IV SOLN
2.0000 g | INTRAVENOUS | Status: AC
  Administered 2021-12-31: 2 g via INTRAVENOUS
  Filled 2021-12-31: qty 2

## 2021-12-31 MED ORDER — ATENOLOL 25 MG PO TABS
50.0000 mg | ORAL_TABLET | Freq: Every day | ORAL | Status: DC
  Administered 2022-01-01: 50 mg via ORAL
  Filled 2021-12-31: qty 2

## 2021-12-31 MED ORDER — FENTANYL CITRATE (PF) 100 MCG/2ML IJ SOLN
INTRAMUSCULAR | Status: DC | PRN
  Administered 2021-12-31 (×2): 50 ug via INTRAVENOUS
  Administered 2021-12-31: 100 ug via INTRAVENOUS

## 2021-12-31 MED ORDER — IBUPROFEN 400 MG PO TABS
600.0000 mg | ORAL_TABLET | Freq: Four times a day (QID) | ORAL | Status: DC
  Administered 2022-01-01: 600 mg via ORAL
  Filled 2021-12-31: qty 1

## 2021-12-31 MED ORDER — PROPOFOL 500 MG/50ML IV EMUL
INTRAVENOUS | Status: DC | PRN
  Administered 2021-12-31: 50 ug/kg/min via INTRAVENOUS

## 2021-12-31 MED ORDER — MIDAZOLAM HCL 2 MG/2ML IJ SOLN
INTRAMUSCULAR | Status: AC
  Filled 2021-12-31: qty 2

## 2021-12-31 MED ORDER — SODIUM CHLORIDE (PF) 0.9 % IJ SOLN
INTRAMUSCULAR | Status: AC
  Filled 2021-12-31: qty 20

## 2021-12-31 MED ORDER — RIVAROXABAN 10 MG PO TABS: 10.0000 mg | ORAL_TABLET | Freq: Every day | ORAL | 0 refills | Status: DC

## 2021-12-31 MED ORDER — LIDOCAINE HCL (PF) 2 % IJ SOLN
INTRAMUSCULAR | Status: DC | PRN
  Administered 2021-12-31: 1.5 mg/kg/h via INTRADERMAL

## 2021-12-31 MED ORDER — AMLODIPINE BESYLATE 5 MG PO TABS
5.0000 mg | ORAL_TABLET | Freq: Every day | ORAL | Status: DC
  Administered 2022-01-01: 5 mg via ORAL
  Filled 2021-12-31: qty 1

## 2021-12-31 MED ORDER — BUPIVACAINE HCL 0.25 % IJ SOLN
INTRAMUSCULAR | Status: DC | PRN
  Administered 2021-12-31: 31 mL

## 2021-12-31 MED ORDER — STERILE WATER FOR IRRIGATION IR SOLN
Status: DC | PRN
  Administered 2021-12-31: 1000 mL

## 2021-12-31 MED ORDER — FENTANYL CITRATE (PF) 250 MCG/5ML IJ SOLN
INTRAMUSCULAR | Status: AC
  Filled 2021-12-31: qty 5

## 2021-12-31 MED ORDER — ORAL CARE MOUTH RINSE: 15.0000 mL | Freq: Once | OROMUCOSAL | Status: AC

## 2021-12-31 MED ORDER — PROPOFOL 10 MG/ML IV BOLUS
INTRAVENOUS | Status: DC | PRN
  Administered 2021-12-31: 110 mg via INTRAVENOUS

## 2021-12-31 MED ORDER — ONDANSETRON HCL 4 MG/2ML IJ SOLN: 4.0000 mg | Freq: Four times a day (QID) | INTRAMUSCULAR | Status: DC | PRN

## 2021-12-31 MED ORDER — ROCURONIUM BROMIDE 100 MG/10ML IV SOLN
INTRAVENOUS | Status: DC | PRN
  Administered 2021-12-31: 50 mg via INTRAVENOUS
  Administered 2021-12-31 (×4): 10 mg via INTRAVENOUS

## 2021-12-31 MED ORDER — ONDANSETRON HCL 4 MG/2ML IJ SOLN
INTRAMUSCULAR | Status: AC
  Filled 2021-12-31: qty 2

## 2021-12-31 MED ORDER — MIDAZOLAM HCL 5 MG/5ML IJ SOLN
INTRAMUSCULAR | Status: DC | PRN
  Administered 2021-12-31: 1 mg via INTRAVENOUS

## 2021-12-31 MED ORDER — HEPARIN SODIUM (PORCINE) 5000 UNIT/ML IJ SOLN
INTRAMUSCULAR | Status: AC
  Administered 2021-12-31: 5000 [IU] via SUBCUTANEOUS
  Filled 2021-12-31: qty 1

## 2021-12-31 MED ORDER — LIDOCAINE HCL URETHRAL/MUCOSAL 2 % EX GEL
CUTANEOUS | Status: AC
  Filled 2021-12-31: qty 60

## 2021-12-31 MED ORDER — SENNOSIDES-DOCUSATE SODIUM 8.6-50 MG PO TABS
2.0000 | ORAL_TABLET | Freq: Every day | ORAL | Status: DC
  Administered 2021-12-31: 2 via ORAL
  Filled 2021-12-31: qty 2

## 2021-12-31 MED ORDER — DEXAMETHASONE SODIUM PHOSPHATE 10 MG/ML IJ SOLN: INTRAMUSCULAR | Status: DC | PRN

## 2021-12-31 MED ORDER — ONDANSETRON HCL 4 MG PO TABS: 4.0000 mg | ORAL_TABLET | Freq: Four times a day (QID) | ORAL | Status: DC | PRN

## 2021-12-31 MED ORDER — HEMOSTATIC AGENTS (NO CHARGE) OPTIME
TOPICAL | Status: DC | PRN
  Administered 2021-12-31: 1 via TOPICAL

## 2021-12-31 MED ORDER — ACETAMINOPHEN 500 MG PO TABS: 1000.0000 mg | ORAL_TABLET | ORAL | Status: AC

## 2021-12-31 MED ORDER — LIDOCAINE 2% (20 MG/ML) 5 ML SYRINGE
INTRAMUSCULAR | Status: DC | PRN
  Administered 2021-12-31: 40 mg via INTRAVENOUS

## 2021-12-31 MED ORDER — BUPIVACAINE LIPOSOME 1.3 % IJ SUSP
INTRAMUSCULAR | Status: AC
  Filled 2021-12-31: qty 20

## 2021-12-31 MED ORDER — GLYCOPYRROLATE 0.2 MG/ML IJ SOLN
INTRAMUSCULAR | Status: DC | PRN
  Administered 2021-12-31: .1 mg via INTRAVENOUS

## 2021-12-31 MED ORDER — PHENYLEPHRINE HCL-NACL 20-0.9 MG/250ML-% IV SOLN
INTRAVENOUS | Status: DC | PRN
  Administered 2021-12-31: 20 ug/min via INTRAVENOUS

## 2021-12-31 MED ORDER — TRAMADOL HCL 50 MG PO TABS: 100.0000 mg | ORAL_TABLET | Freq: Two times a day (BID) | ORAL | Status: DC | PRN

## 2021-12-31 MED ORDER — BUPIVACAINE HCL 0.25 % IJ SOLN
INTRAMUSCULAR | Status: AC
  Filled 2021-12-31: qty 1

## 2021-12-31 MED ORDER — FENTANYL CITRATE PF 50 MCG/ML IJ SOSY
25.0000 ug | PREFILLED_SYRINGE | INTRAMUSCULAR | Status: DC | PRN
  Administered 2021-12-31: 50 ug via INTRAVENOUS

## 2021-12-31 MED ORDER — LIDOCAINE HCL (PF) 2 % IJ SOLN
INTRAMUSCULAR | Status: AC
  Filled 2021-12-31: qty 5

## 2021-12-31 MED ORDER — FENTANYL CITRATE PF 50 MCG/ML IJ SOSY
PREFILLED_SYRINGE | INTRAMUSCULAR | Status: AC
  Filled 2021-12-31: qty 1

## 2021-12-31 MED ORDER — CHLORHEXIDINE GLUCONATE 0.12 % MT SOLN
15.0000 mL | Freq: Once | OROMUCOSAL | Status: AC
  Administered 2021-12-31: 15 mL via OROMUCOSAL

## 2021-12-31 MED ORDER — FENTANYL CITRATE PF 50 MCG/ML IJ SOSY
12.5000 ug | PREFILLED_SYRINGE | INTRAMUSCULAR | Status: DC | PRN
  Administered 2021-12-31: 12.5 ug via INTRAVENOUS
  Filled 2021-12-31: qty 1

## 2021-12-31 MED ORDER — KCL IN DEXTROSE-NACL 20-5-0.45 MEQ/L-%-% IV SOLN
INTRAVENOUS | Status: DC
  Filled 2021-12-31 (×2): qty 1000

## 2021-12-31 MED ORDER — AMISULPRIDE (ANTIEMETIC) 5 MG/2ML IV SOLN: 10.0000 mg | Freq: Once | INTRAVENOUS | Status: DC | PRN

## 2021-12-31 MED ORDER — DEXAMETHASONE SODIUM PHOSPHATE 4 MG/ML IJ SOLN
4.0000 mg | INTRAMUSCULAR | Status: AC
  Administered 2021-12-31: 4 mg via INTRAVENOUS

## 2021-12-31 MED ORDER — LACTATED RINGERS IR SOLN
Status: DC | PRN
  Administered 2021-12-31: 1000 mL

## 2021-12-31 MED ORDER — PROPOFOL 500 MG/50ML IV EMUL
INTRAVENOUS | Status: AC
  Filled 2021-12-31: qty 50

## 2021-12-31 MED ORDER — LACTATED RINGERS IV SOLN: INTRAVENOUS | Status: DC

## 2021-12-31 MED ORDER — ALBUMIN HUMAN 5 % IV SOLN: INTRAVENOUS | Status: DC | PRN

## 2021-12-31 MED ORDER — HEPARIN SODIUM (PORCINE) 5000 UNIT/ML IJ SOLN: 5000.0000 [IU] | INTRAMUSCULAR | Status: AC

## 2021-12-31 MED ORDER — ACETAMINOPHEN 500 MG PO TABS
1000.0000 mg | ORAL_TABLET | Freq: Two times a day (BID) | ORAL | Status: DC
  Administered 2022-01-01: 1000 mg via ORAL
  Filled 2021-12-31: qty 2

## 2021-12-31 SURGICAL SUPPLY — 75 items
APPLICATOR ARISTA FLEXITIP XL (MISCELLANEOUS) IMPLANT
APPLICATOR SURGIFLO ENDO (HEMOSTASIS) IMPLANT
BAG LAPAROSCOPIC 12 15 PORT 16 (BASKET) IMPLANT
BAG RETRIEVAL 12/15 (BASKET) ×1
BLADE SURG SZ10 CARB STEEL (BLADE) IMPLANT
COVER BACK TABLE 60X90IN (DRAPES) ×1 IMPLANT
COVER TIP SHEARS 8 DVNC (MISCELLANEOUS) ×1 IMPLANT
COVER TIP SHEARS 8MM DA VINCI (MISCELLANEOUS) ×1
DERMABOND ADVANCED .7 DNX12 (GAUZE/BANDAGES/DRESSINGS) ×1 IMPLANT
DRAPE ARM DVNC X/XI (DISPOSABLE) ×4 IMPLANT
DRAPE COLUMN DVNC XI (DISPOSABLE) ×1 IMPLANT
DRAPE DA VINCI XI ARM (DISPOSABLE) ×4
DRAPE DA VINCI XI COLUMN (DISPOSABLE) ×1
DRAPE SHEET LG 3/4 BI-LAMINATE (DRAPES) ×1 IMPLANT
DRAPE SURG IRRIG POUCH 19X23 (DRAPES) ×1 IMPLANT
DRSG OPSITE POSTOP 4X6 (GAUZE/BANDAGES/DRESSINGS) IMPLANT
DRSG OPSITE POSTOP 4X8 (GAUZE/BANDAGES/DRESSINGS) IMPLANT
ELECT PENCIL ROCKER SW 15FT (MISCELLANEOUS) IMPLANT
ELECT REM PT RETURN 15FT ADLT (MISCELLANEOUS) ×1 IMPLANT
GAUZE 4X4 16PLY ~~LOC~~+RFID DBL (SPONGE) ×2 IMPLANT
GLOVE BIO SURGEON STRL SZ 6 (GLOVE) ×4 IMPLANT
GLOVE BIO SURGEON STRL SZ 6.5 (GLOVE) ×1 IMPLANT
GOWN STRL REUS W/ TWL LRG LVL3 (GOWN DISPOSABLE) ×4 IMPLANT
GOWN STRL REUS W/TWL LRG LVL3 (GOWN DISPOSABLE) ×4
GRASPER SUT TROCAR 14GX15 (MISCELLANEOUS) IMPLANT
HEMOSTAT ARISTA ABSORB 3G PWDR (HEMOSTASIS) IMPLANT
HOLDER FOLEY CATH W/STRAP (MISCELLANEOUS) IMPLANT
IRRIG SUCT STRYKERFLOW 2 WTIP (MISCELLANEOUS) ×1
IRRIGATION SUCT STRKRFLW 2 WTP (MISCELLANEOUS) ×1 IMPLANT
KIT PROCEDURE DA VINCI SI (MISCELLANEOUS)
KIT PROCEDURE DVNC SI (MISCELLANEOUS) IMPLANT
KIT TURNOVER KIT A (KITS) IMPLANT
LIGASURE IMPACT 36 18CM CVD LR (INSTRUMENTS) IMPLANT
MANIPULATOR ADVINCU DEL 3.0 PL (MISCELLANEOUS) IMPLANT
MANIPULATOR ADVINCU DEL 3.5 PL (MISCELLANEOUS) IMPLANT
MANIPULATOR UTERINE 4.5 ZUMI (MISCELLANEOUS) IMPLANT
NDL HYPO 21X1.5 SAFETY (NEEDLE) ×1 IMPLANT
NDL SPNL 18GX3.5 QUINCKE PK (NEEDLE) IMPLANT
NEEDLE HYPO 21X1.5 SAFETY (NEEDLE) ×2 IMPLANT
NEEDLE SPNL 18GX3.5 QUINCKE PK (NEEDLE) IMPLANT
OBTURATOR OPTICAL STANDARD 8MM (TROCAR) ×1
OBTURATOR OPTICAL STND 8 DVNC (TROCAR) ×1
OBTURATOR OPTICALSTD 8 DVNC (TROCAR) ×1 IMPLANT
PACK ROBOT GYN CUSTOM WL (TRAY / TRAY PROCEDURE) ×1 IMPLANT
PAD POSITIONING PINK XL (MISCELLANEOUS) ×1 IMPLANT
PORT ACCESS TROCAR AIRSEAL 12 (TROCAR) ×1 IMPLANT
SEAL CANN UNIV 5-8 DVNC XI (MISCELLANEOUS) ×4 IMPLANT
SEAL XI 5MM-8MM UNIVERSAL (MISCELLANEOUS) ×4
SET TRI-LUMEN FLTR TB AIRSEAL (TUBING) ×1 IMPLANT
SOL PREP POV-IOD 4OZ 10% (MISCELLANEOUS) ×2 IMPLANT
SPIKE FLUID TRANSFER (MISCELLANEOUS) ×1 IMPLANT
SPONGE T-LAP 18X18 ~~LOC~~+RFID (SPONGE) IMPLANT
SURGIFLO W/THROMBIN 8M KIT (HEMOSTASIS) IMPLANT
SUT MNCRL AB 4-0 PS2 18 (SUTURE) IMPLANT
SUT PDS AB 1 TP1 96 (SUTURE) IMPLANT
SUT VIC AB 0 CT1 27 (SUTURE)
SUT VIC AB 0 CT1 27XBRD ANTBC (SUTURE) IMPLANT
SUT VIC AB 2-0 CT1 27 (SUTURE) ×1
SUT VIC AB 2-0 CT1 TAPERPNT 27 (SUTURE) IMPLANT
SUT VIC AB 2-0 SH 27 (SUTURE) ×1
SUT VIC AB 2-0 SH 27X BRD (SUTURE) IMPLANT
SUT VIC AB 4-0 PS2 18 (SUTURE) ×2 IMPLANT
SUT VLOC 180 0 9IN  GS21 (SUTURE) ×1
SUT VLOC 180 0 9IN GS21 (SUTURE) IMPLANT
SYR 10ML LL (SYRINGE) IMPLANT
SYS BAG RETRIEVAL 10MM (BASKET) ×1
SYS WOUND ALEXIS 18CM MED (MISCELLANEOUS)
SYSTEM BAG RETRIEVAL 10MM (BASKET) IMPLANT
SYSTEM WOUND ALEXIS 18CM MED (MISCELLANEOUS) IMPLANT
TOWEL OR NON WOVEN STRL DISP B (DISPOSABLE) IMPLANT
TRAP SPECIMEN MUCUS 40CC (MISCELLANEOUS) IMPLANT
TRAY FOLEY MTR SLVR 16FR STAT (SET/KITS/TRAYS/PACK) ×1 IMPLANT
UNDERPAD 30X36 HEAVY ABSORB (UNDERPADS AND DIAPERS) ×2 IMPLANT
WATER STERILE IRR 1000ML POUR (IV SOLUTION) ×1 IMPLANT
YANKAUER SUCT BULB TIP 10FT TU (MISCELLANEOUS) IMPLANT

## 2021-12-31 NOTE — Anesthesia Postprocedure Evaluation (Signed)
Anesthesia Post Note  Patient: Sarah Wu  Procedure(s) Performed: , XI ROBOTIC ASSISTED TOTAL HYSTERECTOMY BILATERAL SALPINGO OOPHORECTOMY  , CYSTOSCOPY (Bilateral: Abdomen)     Patient location during evaluation: PACU Anesthesia Type: General Level of consciousness: awake and alert Pain management: pain level controlled Vital Signs Assessment: post-procedure vital signs reviewed and stable Respiratory status: spontaneous breathing, nonlabored ventilation, respiratory function stable and patient connected to nasal cannula oxygen Cardiovascular status: blood pressure returned to baseline and stable Postop Assessment: no apparent nausea or vomiting Anesthetic complications: no   No notable events documented.  Last Vitals:  Vitals:   12/31/21 1119 12/31/21 1723  BP: (!) 155/76 (!) 149/84  Pulse: 80 66  Resp: 17 (!) 21  Temp: 36.7 C (!) 36.3 C  SpO2: 96% 100%    Last Pain:  Vitals:   12/31/21 1723  TempSrc:   PainSc: 0-No pain                 Effie Berkshire

## 2021-12-31 NOTE — Brief Op Note (Signed)
12/31/2021  5:13 PM  PATIENT:  Sarah Wu  71 y.o. female  PRE-OPERATIVE DIAGNOSIS:  METASTATIC UTERINE CANCER  POST-OPERATIVE DIAGNOSIS:  METASTATIC UTERINE CANCER  PROCEDURE:  Procedure(s): DIAGNOSTIC LAPAROSCOPY, XI ROBOTIC ASSISTED TOTAL HYSTERECTOMY BILATERAL SALPINGO OOPHORECTOMY  AND DEBULKING, CYSTOSCOPY (Bilateral)  SURGEON:  Surgeon(s) and Role:    Lafonda Mosses, MD - Primary    * Lahoma Crocker, MD - Assisting  ANESTHESIA:   general  EBL:  200 mL   BLOOD ADMINISTERED:none  DRAINS: none   LOCAL MEDICATIONS USED:  MARCAINE     SPECIMEN:  uterus, cervix, tubes and ovaries  DISPOSITION OF SPECIMEN:  PATHOLOGY  COUNTS:  YES  TOURNIQUET:  * No tourniquets in log *  DICTATION: .Note written in EPIC  PLAN OF CARE: Admit for overnight observation  PATIENT DISPOSITION:  PACU - hemodynamically stable.   Delay start of Pharmacological VTE agent (>24hrs) due to surgical blood loss or risk of bleeding: no

## 2021-12-31 NOTE — Interval H&P Note (Signed)
History and Physical Interval Note:  12/31/2021 1:12 PM  Sarah Wu  has presented today for surgery, with the diagnosis of METASTATIC UTERINE CANCER.  The various methods of treatment have been discussed with the patient and family. After consideration of risks, benefits and other options for treatment, the patient has consented to  Procedure(s): DIAGNOSTIC LAPAROSCOPY, POSSIBLE XI ROBOTIC ASSISTED VERSUS OPEN TOTAL HYSTERECTOMY BILATERAL SALPINGO OOPHORECTOMY WITH OMENTECTOMY AND DEBULKING, POSSIBLE BOWEL RESECTION (Bilateral) as a surgical intervention.  The patient's history has been reviewed, patient examined, no change in status, stable for surgery.  I have reviewed the patient's chart and labs.  Questions were answered to the patient's satisfaction.     Lafonda Mosses

## 2021-12-31 NOTE — Anesthesia Procedure Notes (Signed)
Procedure Name: Intubation Date/Time: 12/31/2021 2:00 PM  Performed by: Gwyndolyn Saxon, CRNAPre-anesthesia Checklist: Patient identified, Emergency Drugs available, Suction available and Patient being monitored Patient Re-evaluated:Patient Re-evaluated prior to induction Oxygen Delivery Method: Circle system utilized Preoxygenation: Pre-oxygenation with 100% oxygen Induction Type: IV induction Ventilation: Mask ventilation without difficulty Laryngoscope Size: Miller and 2 Grade View: Grade I Tube type: Oral Tube size: 7.0 mm Number of attempts: 1 Airway Equipment and Method: Stylet Placement Confirmation: ETT inserted through vocal cords under direct vision, positive ETCO2 and breath sounds checked- equal and bilateral Secured at: 20 cm Tube secured with: Tape Dental Injury: Teeth and Oropharynx as per pre-operative assessment

## 2021-12-31 NOTE — Op Note (Signed)
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   Anesthesia: GET  Urine Output: 200 cc  Operative Findings: On EUA, moderately mobile enlarged uterus.  On intra-abdominal entry, filmy adhesions between the anterior liver and the diaphragm bilaterally.  No carcinomatosis noted on the diaphragm or liver.  Normal-appearing stomach, small bowel and omentum.  No peritoneal lesions in the lower abdominal cavity or pelvis.  No ascites.  Uterus enlarged measuring approximately 12-14 cm, bulbous at the top.  Posterior 2 cm fibroid near the fundus.  Posterior aspect of the uterus densely adherent to the colon mesentery.  Somewhat dilated fluid-filled left fallopian tube.  Normal-appearing right adnexa.  Left ovary adherent to the broad ligament and sigmoid mesentery but otherwise normal-appearing.  Significant fibrosis bilaterally of the retroperitoneum.  No obvious adenopathy appreciated. On cystoscopy, bladder dome intact, good efflux noted from bilateral ureteral orifices.  Estimated Blood Loss:  200 cc      Total IV Fluids: see I&O flowsheet         Specimens: uterus, cervix, bilateral tubes and ovaries         Complications:  None apparent; patient tolerated the procedure well.         Disposition: PACU - hemodynamically stable.  Procedure Details  The patient was seen in the Holding Room. The risks, benefits, complications, treatment  options, and expected outcomes were discussed with the patient.  The patient concurred with the proposed plan, giving informed consent.  The site of surgery properly noted/marked. The patient was identified as Sarah Wu and the procedure verified as a Robotic-assisted hysterectomy with bilateral salpingo oophorectomy.   After induction of anesthesia, the patient was draped and prepped in the usual sterile manner. Patient was placed in supine position after anesthesia and draped and prepped in the usual sterile manner as follows: Her arms were tucked to her side with all appropriate precautions.  The patient was secured to the bed using padding and tape across her chest.  The patient was placed in the semi-lithotomy position in [place].  The perineum and vagina were prepped with Betadine. The patient's abdomen was prepped with ChloraPrep and then she was draped after the prep had been allowed to dry for 3 minutes.  A Time Out was held and the above information confirmed.  The urethra was prepped with Betadine. Foley catheter was placed.  A sterile speculum was placed in the vagina.  The cervix was grasped with a single-tooth tenaculum. The cervix was dilated with Kennon Rounds dilators.  The 3.5 Delineator uterine manipulator with a medium colpotomizer ring was placed without difficulty.  A pneum occluder balloon was placed over the manipulator.  OG tube placement was confirmed and to suction.   Next, a 10 mm skin incision was made 1 cm below the subcostal margin in the midclavicular line.  The 5 mm Optiview port and scope was used for direct entry.  Opening pressure was under 10 mm CO2.  The abdomen was insufflated and the findings were noted as above.   At this point and all points during the procedure, the patient's  intra-abdominal pressure did not exceed 15 mmHg. Next, an 8 mm skin incision was made superior to the umbilicus and a right and left port were placed about 8 cm lateral to the robot port  on the right and left side.  A fourth arm was placed on the right.  The 5 mm assist trocar was exchanged for a 10-12 mm port. All ports were placed under direct visualization.  The patient was placed in steep Trendelenburg.  Bowel was already out of the pelvis and in the upper abdomen.  The robot was docked in the normal manner.  Attention was first turned posteriorly. With traction placed anteriorly on the uterus, a combination of blunt and sharp dissection with short bursts of electrocautery were used to meticulously dissect the posterior aspect of the uterus from dense adhesions to the sigmoid colon mesentery. Once freed, filmy adhesions of the right ovary to the cul de sac were lysed. Attention was then turned to the left. Adhesions between the sigmoid mesentery and the left broad ligament were lysed, ultimately freeing the adnexa.   The right and left peritoneum were opened parallel to the IP ligament to open the retroperitoneal spaces bilaterally. The round ligaments were transected. The ureter was noted to be on the medial leaf of the broad ligament.  The peritoneum above the ureter was incised and stretched and the infundibulopelvic ligament was skeletonized, cauterized and cut.  On the left, given adhesions of the adnexa to the broad ligament, the utero-ovarian and fallopian tube were cauterized and transected, ultimately freeing the left adnexa, which was placed in an endocatch bag.   The posterior peritoneum was taken down to the level of the KOH ring.  The anterior peritoneum was also taken down.  The bladder flap was created to the level of the KOH ring.  The uterine artery on the right side was skeletonized, cauterized and cut in the normal manner.  A similar procedure was performed on the left.  The colpotomy was made and the uterus, cervix, right ovary and tube were amputated, placed in an endocatch bag, and delivered through the vagina.  The bag with the left adnexa was also delivered.  Pedicles were inspected and excellent hemostasis was achieved.    2-0 Vicryl was used to reapproximate and achieve hemostasis along a proximal/cuff laceration. The colpotomy at the vaginal cuff was closed with 0 Vicryl at either apex and 0 V-Loc to close the midportion of the cuff in a running manner.    Attention was then turned below.  Cystoscopy was performed after the bladder was instilled with 200 cc of sterile fluid.  Findings noted above.  New Foley catheter was then placed.  Irrigation was used and excellent hemostasis was achieved.  Floseal and Arista were both placed in the surgical beds and along the large bowel mesentery.  Intra-abdominal pressure was decreased to 5 mmHg with excellent hemostasis noted.  At this point in the procedure was completed.  Robotic instruments were removed under direct visulaization.  The robot was undocked. The fascia at the 10-12 mm port was closed with 0 Vicryl using a PMI fascial closure device under direct visualization.  The subcuticular tissue was closed with 4-0 Vicryl and the skin was closed with 4-0 Monocryl in a subcuticular manner.  Dermabond was applied.    The vagina was swabbed with  minimal bleeding noted. All sponge, lap and needle counts were correct x  3.   The patient was transferred to the recovery room in stable  condition.  Jeral Pinch, MD

## 2021-12-31 NOTE — Discharge Instructions (Addendum)
AFTER SURGERY INSTRUCTIONS   Return to work: 4-6 weeks if applicable    You will also be recommended to take a blood thinner (Xarelto) after surgery for 2 weeks since done through small incisions. THIS WOULD BE FOR AFTER SURGERY ONLY AND TO BE GIVEN AT THE SAME TIME EACH DAY. START TAKING THIS ON SATURDAY, January 03, 2022.   Activity: 1. Be up and out of the bed during the day.  Take a nap if needed.  You may walk up steps but be careful and use the hand rail.  Stair climbing will tire you more than you think, you may need to stop part way and rest.    2. No lifting or straining for 6 weeks over 10 pounds. No pushing, pulling, straining for 6 weeks.   3. No driving for 7-85 days if you were cleared to drive before surgery.  Do not drive if you are taking narcotic pain medicine and make sure that your reaction time has returned.    4. You can shower as soon as the next day after surgery. Shower daily.  Use your regular soap and water (not directly on the incision) and pat your incision(s) dry afterwards; don't rub.  No tub baths or submerging your body in water until cleared by your surgeon. If you have the soap that was given to you by pre-surgical testing that was used before surgery, you do not need to use it afterwards because this can irritate your incisions.    5. No sexual activity and nothing in the vagina for 8-10 weeks.   6. You may experience a small amount of clear drainage from your incisions, which is normal.  If the drainage persists, increases, or changes color please call the office.   7. Do not use creams, lotions, or ointments such as neosporin on your incisions after surgery until advised by your surgeon because they can cause removal of the dermabond glue on your incisions.     8. You may experience vaginal spotting after surgery or around the 6-8 week mark from surgery when the stitches at the top of the vagina begin to dissolve.  The spotting is normal but if you  experience heavy bleeding, call our office.   9. Take Tylenol first for pain if you are able to take these medications and only use narcotic pain medication for severe pain not relieved by the Tylenol.  Monitor your Tylenol intake to a max of 4,000 mg in a 24 hour period.    Diet: 1. Low sodium Heart Healthy Diet is recommended but you are cleared to resume your normal (before surgery) diet after your procedure.   2. It is safe to use a laxative, such as Miralax or Colace, if you have difficulty moving your bowels. You have been prescribed Sennakot-S to take at bedtime every evening after surgery to keep bowel movements regular and to prevent constipation.     Wound Care: 1. Keep clean and dry.  Shower daily.   Reasons to call the Doctor: Fever - Oral temperature greater than 100.4 degrees Fahrenheit Foul-smelling vaginal discharge Difficulty urinating Nausea and vomiting Increased pain at the site of the incision that is unrelieved with pain medicine. Difficulty breathing with or without chest pain New calf pain especially if only on one side Sudden, continuing increased vaginal bleeding with or without clots.   Contacts: For questions or concerns you should contact:   Dr. Jeral Pinch at [phone]   Joylene John, NP at [phone]  After Hours: call [phone] and have the GYN Oncologist paged/contacted (after 5 pm or on the weekends).   Messages sent via mychart are for non-urgent matters and are not responded to after hours so for urgent needs, please call the after hours number.

## 2021-12-31 NOTE — Anesthesia Preprocedure Evaluation (Addendum)
Anesthesia Evaluation  Patient identified by MRN, date of birth, ID band Patient awake    Reviewed: Allergy & Precautions, NPO status , Patient's Chart, lab work & pertinent test results  Airway Mallampati: I  TM Distance: >3 FB Neck ROM: Full    Dental  (+) Missing,    Pulmonary neg pulmonary ROS   Pulmonary exam normal        Cardiovascular hypertension,  Rhythm:Regular Rate:Normal     Neuro/Psych negative neurological ROS  negative psych ROS   GI/Hepatic negative GI ROS, Neg liver ROS,,,  Endo/Other  negative endocrine ROS    Renal/GU negative Renal ROS     Musculoskeletal  (+) Arthritis ,    Abdominal   Peds  Hematology negative hematology ROS (+)   Anesthesia Other Findings   Reproductive/Obstetrics                             Anesthesia Physical Anesthesia Plan  ASA: 3  Anesthesia Plan: General   Post-op Pain Management: Tylenol PO (pre-op)*   Induction: Intravenous  PONV Risk Score and Plan: 4 or greater and Ondansetron, Dexamethasone, Midazolam, Scopolamine patch - Pre-op and Treatment may vary due to age or medical condition  Airway Management Planned: Oral ETT  Additional Equipment: None  Intra-op Plan:   Post-operative Plan: Extubation in OR  Informed Consent: I have reviewed the patients History and Physical, chart, labs and discussed the procedure including the risks, benefits and alternatives for the proposed anesthesia with the patient or authorized representative who has indicated his/her understanding and acceptance.     Dental advisory given  Plan Discussed with: CRNA  Anesthesia Plan Comments:        Anesthesia Quick Evaluation

## 2021-12-31 NOTE — Transfer of Care (Signed)
Immediate Anesthesia Transfer of Care Note  Patient: Sarah Wu  Procedure(s) Performed: , XI ROBOTIC ASSISTED TOTAL HYSTERECTOMY BILATERAL SALPINGO OOPHORECTOMY  , CYSTOSCOPY (Bilateral: Abdomen)  Patient Location: PACU  Anesthesia Type:General  Level of Consciousness: drowsy and patient cooperative  Airway & Oxygen Therapy: Patient Spontanous Breathing and Patient connected to face mask oxygen  Post-op Assessment: Report given to RN and Post -op Vital signs reviewed and stable  Post vital signs: Reviewed and stable  Last Vitals:  Vitals Value Taken Time  BP 149/84 12/31/21 1723  Temp    Pulse 61 12/31/21 1727  Resp 28 12/31/21 1727  SpO2 100 % 12/31/21 1727  Vitals shown include unvalidated device data.  Last Pain:  Vitals:   12/31/21 1120  TempSrc:   PainSc: 0-No pain         Complications: No notable events documented.

## 2022-01-01 DIAGNOSIS — C55 Malignant neoplasm of uterus, part unspecified: Secondary | ICD-10-CM | POA: Diagnosis present

## 2022-01-01 DIAGNOSIS — K682 Retroperitoneal fibrosis: Secondary | ICD-10-CM | POA: Diagnosis present

## 2022-01-01 DIAGNOSIS — N852 Hypertrophy of uterus: Secondary | ICD-10-CM | POA: Diagnosis present

## 2022-01-01 DIAGNOSIS — R7303 Prediabetes: Secondary | ICD-10-CM | POA: Diagnosis present

## 2022-01-01 DIAGNOSIS — Z803 Family history of malignant neoplasm of breast: Secondary | ICD-10-CM | POA: Diagnosis not present

## 2022-01-01 DIAGNOSIS — M1990 Unspecified osteoarthritis, unspecified site: Secondary | ICD-10-CM | POA: Diagnosis present

## 2022-01-01 DIAGNOSIS — E7800 Pure hypercholesterolemia, unspecified: Secondary | ICD-10-CM | POA: Diagnosis present

## 2022-01-01 DIAGNOSIS — N736 Female pelvic peritoneal adhesions (postinfective): Secondary | ICD-10-CM | POA: Diagnosis present

## 2022-01-01 DIAGNOSIS — Z885 Allergy status to narcotic agent status: Secondary | ICD-10-CM | POA: Diagnosis not present

## 2022-01-01 DIAGNOSIS — I10 Essential (primary) hypertension: Secondary | ICD-10-CM | POA: Diagnosis present

## 2022-01-01 DIAGNOSIS — Z79899 Other long term (current) drug therapy: Secondary | ICD-10-CM | POA: Diagnosis not present

## 2022-01-01 DIAGNOSIS — C541 Malignant neoplasm of endometrium: Secondary | ICD-10-CM | POA: Diagnosis present

## 2022-01-01 DIAGNOSIS — Z96653 Presence of artificial knee joint, bilateral: Secondary | ICD-10-CM | POA: Diagnosis present

## 2022-01-01 LAB — CBC
HCT: 30.5 % — ABNORMAL LOW (ref 36.0–46.0)
Hemoglobin: 9.3 g/dL — ABNORMAL LOW (ref 12.0–15.0)
MCH: 27.6 pg (ref 26.0–34.0)
MCHC: 30.5 g/dL (ref 30.0–36.0)
MCV: 90.5 fL (ref 80.0–100.0)
Platelets: 248 10*3/uL (ref 150–400)
RBC: 3.37 MIL/uL — ABNORMAL LOW (ref 3.87–5.11)
RDW: 19.7 % — ABNORMAL HIGH (ref 11.5–15.5)
WBC: 6 10*3/uL (ref 4.0–10.5)
nRBC: 0 % (ref 0.0–0.2)

## 2022-01-01 LAB — BASIC METABOLIC PANEL
Anion gap: 8 (ref 5–15)
BUN: 7 mg/dL — ABNORMAL LOW (ref 8–23)
CO2: 23 mmol/L (ref 22–32)
Calcium: 8.9 mg/dL (ref 8.9–10.3)
Chloride: 107 mmol/L (ref 98–111)
Creatinine, Ser: 0.69 mg/dL (ref 0.44–1.00)
GFR, Estimated: >60 mL/min (ref >60)
Glucose, Bld: 148 mg/dL — ABNORMAL HIGH (ref 70–99)
Potassium: 3.7 mmol/L (ref 3.5–5.1)
Sodium: 138 mmol/L (ref 135–145)

## 2022-01-01 NOTE — Discharge Summary (Signed)
Physician Discharge Summary  Patient ID: Sarah Wu MRN: [id number] DOB/AGE: November 16, 1950 71 y.o.  Admit date: 12/31/2021 Discharge date: 01/01/2022  Admission Diagnoses: Uterine cancer [facility])  Discharge Diagnoses:  Principal Problem:   Uterine cancer [facility]) Active Problems:   Carcinosarcoma of endometrium [facility])   Discharged Condition:  The patient is in good condition and stable for discharge.   Hospital Course: Pt underwent a robotic-assisted laparoscopic total hysterectomy with bilateral salpingo-oophorectomy, lysis of adhesions for approximately 40 minutes, cystoscopy with Dr. Berline Lopes on 12/31/21. The postoperative course was uneventful. Her foley catheter was removed and she was voiding spontaneously. Pain was well controlled. She was eating/drinking without issue. She was discharged to home on postoperative day 1.  Consults: None  Significant Diagnostic Studies: none  Treatments: analgesia: acetaminophen and ibuprofen, cardiac meds: atenolol and amlodipine, anticoagulation: prophylactic lovenox, and surgery: Robotic-assisted laparoscopic total hysterectomy with bilateral salpingo-oophorectomy, lysis of adhesions for approximately 40 minutes, cystoscopy   Discharge Exam: Blood pressure 138/83, pulse 77, temperature 98.7 F (37.1 C), temperature source Oral, resp. rate 16, height '5\' 8"'$  (1.727 m), weight 199 lb 15.3 oz (90.7 kg), SpO2 100 %. General appearance: alert, cooperative, and no distress Head: Normocephalic, without obvious abnormality, atraumatic Resp: normal work of breathing Cardio: regular rate and rhythm GI: soft, non-tender; bowel sounds normal; no masses,  no organomegaly  Extremities: extremities normal, atraumatic, no cyanosis or edema Skin: Skin color, texture, turgor normal. No rashes or lesions Incision/Wound: laparoscopic incisions c/d/i with dermabond   Disposition: Discharge disposition: 01-Home or Self Care       Discharge Instructions      Discharge patient   Complete by: As directed    Discharge disposition: 01-Home or Self Care   Discharge patient date: 01/01/2022      Allergies as of 01/01/2022       Reactions   Hydrocodone-acetaminophen Swelling   Swells stomach   Oxycodone-acetaminophen Swelling   Swells stomach        Medication List     STOP taking these medications    bisacodyl 5 MG EC tablet Commonly known as: DULCOLAX   erythromycin base 500 MG tablet Commonly known as: E-MYCIN   neomycin 500 MG tablet Commonly known as: MYCIFRADIN   Turmeric 500 MG Caps       TAKE these medications    allopurinol 300 MG tablet Commonly known as: ZYLOPRIM Take 300 mg by mouth daily.   amLODipine 5 MG tablet Commonly known as: NORVASC Take 5 mg by mouth at bedtime.   atenolol 50 MG tablet Commonly known as: TENORMIN Take 50 mg by mouth daily.   atorvastatin 10 MG tablet Commonly known as: LIPITOR Take 10 mg by mouth at bedtime.   dexamethasone 4 MG tablet Commonly known as: DECADRON Take 2 tablets by mouth the night before and 2 tablets the morning of chemotherapy, every 3 weeks, for 6 cycles   ferrous sulfate 325 (65 FE) MG tablet Take 325 mg by mouth daily.   furosemide 20 MG tablet Commonly known as: LASIX Take 1 tablet (20 mg total) by mouth daily.   lidocaine-prilocaine cream Commonly known as: EMLA Apply to affected area once   multivitamin with minerals tablet Take 1 tablet by mouth at bedtime. Centrum   ondansetron 8 MG tablet Commonly known as: Zofran Take 1 tablet by mouth every 8 hours as needed for nausea or vomiting. Start on the third day after chemotherapy.   polyethylene glycol 17 g packet Commonly known as: MIRALAX / GLYCOLAX Take  17 g by mouth daily.   prochlorperazine 10 MG tablet Commonly known as: COMPAZINE Take 1 tablet (10 mg total) by mouth every 6 (six) hours as needed for nausea or vomiting.   rivaroxaban 10 MG Tabs tablet Commonly known as:  XARELTO Take 1 tablet (10 mg total) by mouth daily. For AFTER surgery, do not start until Saturday morning (November 25). Take at the same time each day once a day. Start taking on: January 03, 2022   senna-docusate 8.6-50 MG tablet Commonly known as: Senokot-S Take 2 tablets by mouth 2 (two) times daily. For AFTER surgery, do not take if having loose stools   traMADol 50 MG tablet Commonly known as: ULTRAM Take 1-2 tablets (50-100 mg total) by mouth every 6 (six) hours as needed for severe pain. For AFTER surgery, do not take and drive   zinc gluconate 50 MG tablet Take 50 mg by mouth daily.        Follow-up Information     Lafonda Mosses, MD Follow up on 01/07/2022.   Specialty: Gynecologic Oncology Why: around 4 pm will be a PHONE visit to check in and discuss final pathology. IN PERSON visit will be on 01/30/2022 at 2:15pm at the [facility]. Contact information: [address] [address] [phone]                 Greater than thirty minutes were spend for face to face discharge instructions and discharge orders/summary in EPIC.   SignedBernadene Bell 01/01/2022, 1:24 PM

## 2022-01-01 NOTE — Plan of Care (Signed)
Patient  is ready for discharge. No signs of acute distress noted. Denied pain at this time. Moving, voiding and eating well  after surgery. Daughter in room to take patient home.

## 2022-01-02 ENCOUNTER — Telehealth: Payer: Self-pay | Admitting: Surgery

## 2022-01-02 NOTE — Telephone Encounter (Signed)
Spoke with Ms. Mchale this morning. She states she is eating, drinking and urinating well. She has not had a BM yet but is passing gas. She is taking docusate sodium as prescribed and encouraged her to drink plenty of water. She denies fever or chills. Incisions are dry and intact. She rates her pain 7/10. Her pain is controlled with Tramadol. Patient states she was sent home wit a binder but was not fitted and it is too big. Instructed patient to get properly sized binder from local pharmacy.   Reminded patient importance of picking up Xarelto prescription from her local pharmacy today and beginning medication tomorrow morning, 11/25. Daughter stated she was on her way to pick it up and verbalized understanding of administration instructions.  Instructed to call office with any fever, chills, purulent drainage, uncontrolled pain or any other questions or concerns. Patient verbalizes understanding.   Pt aware of post op appointments as well as the office number [phone] and after hours number [phone] to call if she has any questions or concerns

## 2022-01-05 LAB — SURGICAL PATHOLOGY

## 2022-01-07 ENCOUNTER — Inpatient Hospital Stay (HOSPITAL_BASED_OUTPATIENT_CLINIC_OR_DEPARTMENT_OTHER): Payer: Medicare Other | Admitting: Gynecologic Oncology

## 2022-01-07 ENCOUNTER — Encounter: Payer: Self-pay | Admitting: Gynecologic Oncology

## 2022-01-07 DIAGNOSIS — K5909 Other constipation: Secondary | ICD-10-CM

## 2022-01-07 DIAGNOSIS — Z90722 Acquired absence of ovaries, bilateral: Secondary | ICD-10-CM

## 2022-01-07 DIAGNOSIS — C55 Malignant neoplasm of uterus, part unspecified: Secondary | ICD-10-CM

## 2022-01-07 DIAGNOSIS — Z7189 Other specified counseling: Secondary | ICD-10-CM

## 2022-01-07 DIAGNOSIS — C801 Malignant (primary) neoplasm, unspecified: Secondary | ICD-10-CM

## 2022-01-07 DIAGNOSIS — C539 Malignant neoplasm of cervix uteri, unspecified: Secondary | ICD-10-CM

## 2022-01-07 DIAGNOSIS — Z90710 Acquired absence of both cervix and uterus: Secondary | ICD-10-CM

## 2022-01-07 NOTE — Progress Notes (Signed)
Gynecologic Oncology Telehealth Note: Gyn-Onc  I connected with Sarah Wu on 01/07/22 at  4:00 PM EST by telephone and verified that I am speaking with the correct person using two identifiers.  I discussed the limitations, risks, security and privacy concerns of performing an evaluation and management service by telemedicine and the availability of in-person appointments. I also discussed with the patient that there may be a patient responsible charge related to this service. The patient expressed understanding and agreed to proceed.  Other persons participating in the visit and their role in the encounter: Heath Lark, daughter.  Patient's location: home Provider's location: [facility]  Reason for Visit: follow-up after surgery, treatment planning  Treatment History: Oncology History Overview Note  Outside path showed carcinosarcoma, PD-L1 positive, MSI stable   Uterine cancer ([place])  07/30/2021 Initial Diagnosis   She was evaluated for post-menopausal bleeding. She reported having been seen a year previously and having a biopsy that was negative.  On exam in clinic, she was noted to have a mass present within the vagina.  This was thought to be a prolapsing fibroid and plan was made for exam under anesthesia, removal prolapsing fibroid with hysteroscopy and D&C   08/11/2021 Pathology Results   PD-L1 [phone]):                                             Does meet FDA-approval, Combined Positive Score (CPS): >=1   Pan-TRK IHC:                                              Tumor cells are Focally Positive for TRK protein expression.   MICROSATELLITE INSTABILITY:             MS-Stable   TUMOR MUTATION BURDEN:                10.8 Muts/Mb TMB-Low   OTHER BIOMARKERS:                              MET amplification PIK3CA amplification MDM2 amplification CCNE1 amplification   Pertinent Negative Biomarkers Evaluated by NGS: ALK ATM BARD1 BRAF BRCA1 BRCA2 CDK12 CHECK1 CHEK2 EGFR  ERBB2 ESR1 FANCL  FGFR2 FGFR3 IDH1 IDH2 KIT KRAS NRAS NTRK1 NTRK2 NTRK3 PALB2 PDGFRA RAD51B RAD51C  RAD51D RAD54L RET ROS1   Comment:  The above studies are performed and reported by [facility] [place]) as part of the [employer] (NGS) panel.  Pertinent results are summarized above.  Please see their separate reports for more detailed information  Addendum electronically signed by Elsworth Soho, MD on 09/03/2021 at  7:37 AM  Final Diagnosis    A.  CERVICAL MASS, EXCISION:       - CONSISTENT WITH CARCINOSARCOMA (HIGH GRADE MALIGNANT BIPHASIC TUMOR)  Electronically signed by Elsworth Soho, MD on 08/15/2021 at  9:53 AM  Comment    The malignant epithelial component exhibits high-grade cytomorphology and immunostaining pattern consistent with serous carcinoma to include strong positive diffuse reactivity for p53, p16, and vimentin, focal subset CK7 and CK AE1/AE3 expression, with high Ki-67 staining (approximately 60%).  Neuroendocrine differentiation is present (CD56 negative but synaptophysin positive).  The stromal component is composed of malignant mitotically  active cells and heterologous elements with focal chondroid differentiation (S100 positive).  SMA appears to decorate rare tumor cells and highlights vascular structures.  The tumor cells are non-reactive for CK20, WT1, GATA3, p40 and desmin.  Block A5 is referred to [facility], [place]) to perform molecular characterization.  These findings are shared with Dr. Luanne Bras on [phone] at [phone] hours by telephone conversation. This case received prospective intradepartmental quality assurance review.   Clinical Information    Cervical mass  Gross Description    A. Cervix Received fresh labeled with the patient's name and date of birth and "cervical mass frozen section" per container is an aggregate of red-tan friable soft tissue measuring approximately 5 x 5 x 3 cm in toto.  Representative sections are frozen for an intraoperative consultation.   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   Results rendered by Dr. Wyatt Portela and reported to Dr. Macarthur Critchley at [facility] on 08/11/2021 at 8:06 AM.  Microscopic Description    Microscopic examination supports the above diagnosis.  Single antibody immunostains are performed to include: S100, p40, p16, p53, PAX8, CK7, CK20, vimentin, CK AE1/3, GATA3, WT1, desmin, SMA, synaptophysin, CD56, and Ki-67.  All controls are appropriately reactive. [id number], [id number], [id number], [id number], [id number], [id number] (15)          08/22/2021 Initial Diagnosis   Carcinosarcoma ([place])   09/01/2021 Imaging   CT abdomen and pelvis There is marked enlargement of the uterus measuring 13.7 x 10 x 8 x 10.1 cm. There is inhomogeneous enhancement in the uterus consistent with malignant neoplasm.   There are enlarged lymph nodes in the retroperitoneum in the para-and paracaval regions measuring up to 12 mm in short axis. There are a few enlarged lymph nodes adjacent to the iliac vessels largest measuring 2.6 x 1.4 cm adjacent to the right external iliac vessels. Findings suggest possible metastatic lymphadenopathy. There is mild prominence of the pelvocaliceal system in the left kidney and proximal left ureter. Possibility of extrinsic compression or infiltrative process related to the uterine neoplasm in the distal course of left ureter causing mild obstruction is not excluded.   Gallbladder stone.  Diverticulosis of colon.   Other findings as described in the body of the report.   09/01/2021 Imaging   US pelvis Large central uterine ill-defined mass approximately 7.7 cm diameter enlarging uterus likely representing the biopsy-proven neoplasm, likely of endometrial origin.   Questionable intramural leiomyoma  posterior mid uterus 3.1 cm diameter.   The mass extend and the adnexal regions are poorly characterized but may potentially be better evaluated by MR imaging with and without contrast.   09/10/2021 Surgery   Pre-operative Diagnosis: carcinosarcoma of the uterus, adenopathy on imaging   Post-operative Diagnosis: same, stage IVB carcinosarcoma of the uterus   Operation: Diagnostic laparoscopy    Surgeon: Jeral Pinch MD Operative Findings: On EUA, cervix 2 cm dilated with tumor noted within the os. Cervix otherwise normal appearing. Uterus enlarged, 12 cm, with limited mobility secondary to weight. Compression of the rectum from uterus although no direct invasion appreciated. On intra-abdominal inspection, adhesions noted between the liver and the anterior abdominal wall. Evidence of carcinomatosis involving liver surface, diaphragm, omentum, anterior abdominal wall. Surface of the uterus with obvious tumor infiltration. Small bowel free from uterus. Posteriorly, sigmoid densely  adherent to the posterior uterus. Even with manipulator, unable to move the uterus anteriorly. Minimal ascites.    Given findings of stage IV disease and what would require large bowel resection and end ostomy, decision made to abort surgery in favor of neoadjuvant chemotherapy. I had discussed this as a distinct possibility when we had gotten her CT results as well as this morning prior to the surgery.    09/15/2021 Cancer Staging   Staging form: Corpus Uteri - Carcinoma and Carcinosarcoma, AJCC 8th Edition - Clinical stage from 09/15/2021: FIGO Stage IVB (cT3, cN2a, pM1) - Signed by Heath Lark, MD on 09/15/2021 Stage prefix: Initial diagnosis   09/24/2021 -  Chemotherapy   Patient is on Treatment Plan : UTERINE ENDOMETRIAL Dostarlimab-gxly (500 mg) + Carboplatin (AUC 5) + Paclitaxel (175 mg/m2) q21d x 6 cycles / Dostarlimab-gxly (1000 mg) q42d x 6 cycles      10/17/2021 Procedure   Status post revision of nonfunctional  right IJ port catheter, with new right IJ port catheter placed. Port is ready for use.     11/24/2021 Imaging   1. Today's study demonstrates a positive response to therapy with partial involution of large malignant appearing uterine mass. 2. Multiple prominent borderline enlarged and mildly enlarged retroperitoneal and pelvic lymph nodes are stable to minimally decreased in size compared to the prior study, potentially metastatic. No new lymphadenopathy is noted elsewhere in the chest, abdomen or pelvis. 3. Multiple small pulmonary nodules measuring 5 mm or less in the lungs. These were incidentally imaged on the prior CT of the abdomen and pelvis 09/01/2021, and at this time are stable. The possibility of metastatic disease is not excluded, and close attention on follow-up studies is recommended. 4. Colonic diverticulosis without evidence of acute diverticulitis at this time. 5. Additional incidental findings, as above.     12/31/2021 Surgery   Robotic-assisted laparoscopic total hysterectomy with bilateral salpingo-oophorectomy, lysis of adhesions for approximately 40 minutes, cystoscopy   Findings: On EUA, moderately mobile enlarged uterus.  On intra-abdominal entry, filmy adhesions between the anterior liver and the diaphragm bilaterally.  No carcinomatosis noted on the diaphragm or liver.  Normal-appearing stomach, small bowel and omentum.  No peritoneal lesions in the lower abdominal cavity or pelvis.  No ascites.  Uterus enlarged measuring approximately 12-14 cm, bulbous at the top.  Posterior 2 cm fibroid near the fundus.  Posterior aspect of the uterus densely adherent to the colon mesentery.  Somewhat dilated fluid-filled left fallopian tube.  Normal-appearing right adnexa.  Left ovary adherent to the broad ligament and sigmoid mesentery but otherwise normal-appearing.  Significant fibrosis bilaterally of the retroperitoneum.  No obvious adenopathy appreciated. On cystoscopy, bladder dome  intact, good efflux noted from bilateral ureteral orifices.   12/31/2021 Pathology Results   A. CULDESAC, POSTERIOR ADHESIONS, EXCISION: -  Serosa and subserosal fibrous soft tissue with focal acute inflammation, reactive mesothelial hyperplasia and adhesions. -  Negative for malignancy.  B. UTERUS, CERVIX, BILATERAL TUBE AND OVAARIES, HYSTERECTOMY: - -  Carcinosarcoma (malignant mixed Mullerian tumor) with full-thickness posterior myometrial involvement with focal serosal present and extension into lower uterine segment and extensively in the cervical stroma. 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 Denies vaginal bleeding. Continues to have constipation, using stool softener.  Denies urinary symptoms. Appetite is good, no nausea or emesis.   Past Medical/Surgical History: Past Medical History:  Diagnosis Date   Arthritis    Cancer ([place])     Fibroid tumor    Hx of mammogram 2022   [place]   Hypercholesteremia    Hypertension    Post-menopausal bleeding    Pre-diabetes     Past Surgical History:  Procedure Laterality Date   EXAMINATION UNDER ANESTHESIA  08/11/2021   with cervical mass biopsies   IR CHEST FLUORO  10/14/2021   IR IMAGING GUIDED PORT INSERTION  09/23/2021   IR IMAGING GUIDED PORT INSERTION  10/17/2021   LAPAROSCOPY N/A 09/10/2021   Procedure: DIAGNOSTIC LAPAROSCOPY;  Surgeon: Lafonda Mosses, MD;  Location: [facility];  Service: Gynecology;  Laterality: N/A;   REPLACEMENT TOTAL KNEE BILATERAL     TUBAL LIGATION      Family History  Problem Relation Age of Onset   Breast cancer Sister        half-sister   Colon cancer Neg Hx    Ovarian cancer Neg Hx    Endometrial cancer Neg Hx    Pancreatic cancer Neg Hx    Prostate cancer Neg Hx     Social History   Socioeconomic History   Marital status: Single    Spouse name: Not on file   Number of children: Not on file   Years of education: Not on file   Highest education level: Not on file  Occupational History   Not on file  Tobacco Use   Smoking status: Never    Passive exposure: Never   Smokeless tobacco: Never  Vaping Use   Vaping Use: Never used  Substance and Sexual Activity   Alcohol use: Never   Drug use: Never   Sexual activity: Not Currently  Other Topics Concern   Not on file  Social History Narrative   Not on file   Social Determinants of Health   Financial Resource Strain: Not on file  Food Insecurity: Food Insecurity Present (09/24/2021)   Hunger Vital Sign    Worried About Running Out of Food in the Last Year: Sometimes true    Ran Out of Food in the Last Year: Not on file  Transportation Needs: No Transportation Needs (09/24/2021)   PRAPARE - Hydrologist (Medical): No    Lack of Transportation (Non-Medical): No  Physical Activity: Not on file  Stress: Not on file  Social Connections: Not on  file    Current Medications:  Current Outpatient Medications:    allopurinol (ZYLOPRIM) 300 MG tablet, Take 300 mg by mouth daily., Disp: , Rfl:    amLODipine (NORVASC) 5 MG tablet, Take 5 mg by mouth at bedtime., Disp: , Rfl:    atenolol (TENORMIN) 50 MG tablet, Take 50 mg by mouth daily., Disp: , Rfl:  atorvastatin (LIPITOR) 10 MG tablet, Take 10 mg by mouth at bedtime., Disp: , Rfl:    dexamethasone (DECADRON) 4 MG tablet, Take 2 tablets by mouth the night before and 2 tablets the morning of chemotherapy, every 3 weeks, for 6 cycles, Disp: 36 tablet, Rfl: 6   ferrous sulfate 325 (65 FE) MG tablet, Take 325 mg by mouth daily., Disp: , Rfl:    furosemide (LASIX) 20 MG tablet, Take 1 tablet (20 mg total) by mouth daily., Disp: 30 tablet, Rfl: 0   lidocaine-prilocaine (EMLA) cream, Apply to affected area once, Disp: 30 g, Rfl: 3   Multiple Vitamins-Minerals (MULTIVITAMIN WITH MINERALS) tablet, Take 1 tablet by mouth at bedtime. Centrum, Disp: , Rfl:    ondansetron (ZOFRAN) 8 MG tablet, Take 1 tablet by mouth every 8 hours as needed for nausea or vomiting. Start on the third day after chemotherapy., Disp: 30 tablet, Rfl: 1   polyethylene glycol (MIRALAX / GLYCOLAX) 17 g packet, Take 17 g by mouth daily., Disp: , Rfl:    prochlorperazine (COMPAZINE) 10 MG tablet, Take 1 tablet (10 mg total) by mouth every 6 (six) hours as needed for nausea or vomiting., Disp: 30 tablet, Rfl: 1   rivaroxaban (XARELTO) 10 MG TABS tablet, Take 1 tablet (10 mg total) by mouth daily. For AFTER surgery, do not start until Saturday morning (November 25). Take at the same time each day once a day., Disp: 14 tablet, Rfl: 0   senna-docusate (SENOKOT-S) 8.6-50 MG tablet, Take 2 tablets by mouth 2 (two) times daily. For AFTER surgery, do not take if having loose stools, Disp: 30 tablet, Rfl: 0   traMADol (ULTRAM) 50 MG tablet, Take 1-2 tablets (50-100 mg total) by mouth every 6 (six) hours as needed for severe pain. For AFTER  surgery, do not take and drive, Disp: 15 tablet, Rfl: 0   zinc gluconate 50 MG tablet, Take 50 mg by mouth daily., Disp: , Rfl:   Review of Symptoms: Pertinent positives as per HPI.  Physical Exam: Deferred given limitations of phone visit.  Laboratory & Radiologic Studies: None new  Assessment & Plan: Sarah Wu is a 71 y.o. woman with Stage IVB carcinosarcoma s/p IDS on 11/22 after 4 cycles of NACT who presents for phone follow-up.  Doing well. Meeting milestones. Discussed adding laxative for constipation. Reviewed continued expectations.  Reviewed pathology with the patient and her daughter. Given cervical involvement and high-risk histology, I've recommended referral to Dr. Sondra Come for consideration of at least VBT. The patient will restart adjuvant chemotherapy in several weeks.   I discussed the assessment and treatment plan with the patient. The patient was provided with an opportunity to ask questions and all were answered. The patient agreed with the plan and demonstrated an understanding of the instructions.   The patient was advised to call back or see an in-person evaluation if the symptoms worsen or if the condition fails to improve as anticipated.   8 minutes of total time was spent for this patient encounter, including preparation, phone counseling with the patient and coordination of care, and documentation of the encounter.   Jeral Pinch, MD  Division of Gynecologic Oncology  Department of Obstetrics and Gynecology  [facility]

## 2022-01-08 ENCOUNTER — Other Ambulatory Visit: Payer: Medicare Other

## 2022-01-08 ENCOUNTER — Ambulatory Visit: Payer: Medicare Other

## 2022-01-08 ENCOUNTER — Ambulatory Visit: Payer: Medicare Other | Admitting: Hematology and Oncology

## 2022-01-09 NOTE — Progress Notes (Signed)
GYN Location of Tumor / Histology: Stage IVB carcinosarcoma   Sarah Wu presented with symptoms of: bleeding  Biopsies revealed:   12/31/2021 FINAL MICROSCOPIC DIAGNOSIS:   A. CULDESAC, POSTERIOR ADHESIONS, EXCISION:  -  Serosa and subserosal fibrous soft tissue with focal acute  inflammation, reactive mesothelial hyperplasia and adhesions.  -  Negative for malignancy.   B. UTERUS, CERVIX, BILATERAL TUBE AND OVAARIES, HYSTERECTOMY:  -  - Carcinosarcoma (malignant mixed Mullerian tumor) with full-thickness  posterior myometrial involvement with focal serosal present and  extension into lower uterine segment and extensively in the cervical  stroma.  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  ypT3a pNn/a pMn/a   Past/Anticipated interventions by Gyn/Onc surgery, if any: Robotic-assisted laparoscopic total hysterectomy with bilateral salpingo-oophorectomy, lysis of adhesions for approximately 40 minutes, cystoscopy 12/31/2021 by Dr. Berline Lopes.  Past/Anticipated interventions by medical oncology, if any: s/p 4 cycles carbo/Taxol/dostarlimab 09/24/2021 - 11/27/2021.  To continue adjuvant chemotherapy 01/29/2022.  Weight changes, if any: {:18581}  Bowel/Bladder complaints, if any: {yes no:314532}, {Blank single:19197::"diarrhea","constipation","urinary frequency","burning","trouble emptying bladder"," "}  Nausea/Vomiting, if any: {:18581}  Pain issues, if any:  {:18581}  SAFETY ISSUES: Prior radiation? {:18581} Pacemaker/ICD? {:18581} Possible current pregnancy? no Is the patient on methotrexate? {:18581}  Current Complaints / other details:  Consideration of at least vaginal brachytherapy.

## 2022-01-12 ENCOUNTER — Other Ambulatory Visit (HOSPITAL_COMMUNITY): Payer: Self-pay | Admitting: Gynecologic Oncology

## 2022-01-12 ENCOUNTER — Other Ambulatory Visit: Payer: Self-pay | Admitting: Gynecologic Oncology

## 2022-01-12 ENCOUNTER — Telehealth: Payer: Self-pay | Admitting: *Deleted

## 2022-01-12 ENCOUNTER — Other Ambulatory Visit: Payer: Self-pay | Admitting: Oncology

## 2022-01-12 DIAGNOSIS — C801 Malignant (primary) neoplasm, unspecified: Secondary | ICD-10-CM

## 2022-01-12 NOTE — Progress Notes (Signed)
Gynecologic Oncology Multi-Disciplinary Disposition Conference Note  Date of the Conference: 01/12/2022  Patient Name: Sarah Wu  Referring Provider: Dr. Macarthur Critchley Primary GYN Oncologist: Dr. Berline Lopes   Stage/Disposition:  Stage IVB carcinosarcoma of the uterus. Disposition is for adjuvant chemotherapy followed by repeat imaging and referral to Radiation Oncology for vaginal brachytherapy.   This Multidisciplinary conference took place involving physicians from [place], Medical Oncology, Radiation Oncology, Pathology, Radiology along with the Gynecologic Oncology Nurse Practitioner and Gynecologic Oncology Nurse Navigator.  Comprehensive assessment of the patient's malignancy, staging, need for surgery, chemotherapy, radiation therapy, and need for further testing were reviewed. Supportive measures, both inpatient and following discharge were also discussed. The recommended plan of care is documented. Greater than 35 minutes were spent correlating and coordinating this patient's care.

## 2022-01-12 NOTE — Progress Notes (Signed)
Called patient - discussed concern for re-review of her October CT chest that there may be a PE. Discussed rec for CT angio and LE dopplers. Will try to get both tests scheduled on 12/7 when she is here for rad onc appt. If PE and/or DVT confirmed, will increase anticoagulation dose.  Jeral Pinch MD Gynecologic Oncology

## 2022-01-12 NOTE — Telephone Encounter (Signed)
Per Dr Berline Lopes scheduled the patient for a CT angio and a doppler for 12/7. Spoke with the patient's daughter Heath Lark and gave appt date/times

## 2022-01-13 ENCOUNTER — Telehealth: Payer: Self-pay | Admitting: *Deleted

## 2022-01-13 NOTE — Telephone Encounter (Signed)
Patient's daughter called and would like to talk with Dr Berline Lopes regarding the phone call from yesterday. Jamal requested that Dr Berline Lopes call her at [phone]

## 2022-01-15 ENCOUNTER — Encounter (HOSPITAL_COMMUNITY): Payer: Self-pay

## 2022-01-15 ENCOUNTER — Ambulatory Visit (HOSPITAL_COMMUNITY)
Admission: RE | Admit: 2022-01-15 | Discharge: 2022-01-15 | Disposition: A | Discharge status: Home / Self Care | Payer: Medicare Other | Source: Ambulatory Visit | Attending: Gynecologic Oncology | Admitting: Gynecologic Oncology

## 2022-01-15 ENCOUNTER — Other Ambulatory Visit: Payer: Self-pay

## 2022-01-15 ENCOUNTER — Ambulatory Visit
Admission: RE | Admit: 2022-01-15 | Discharge: 2022-01-15 | Disposition: A | Discharge status: Home / Self Care | Payer: Medicare Other | Source: Ambulatory Visit | Attending: Radiation Oncology | Admitting: Radiation Oncology

## 2022-01-15 ENCOUNTER — Telehealth: Payer: Self-pay

## 2022-01-15 ENCOUNTER — Encounter: Payer: Self-pay | Admitting: Hematology and Oncology

## 2022-01-15 ENCOUNTER — Telehealth: Payer: Self-pay | Admitting: Gynecologic Oncology

## 2022-01-15 ENCOUNTER — Ambulatory Visit (HOSPITAL_BASED_OUTPATIENT_CLINIC_OR_DEPARTMENT_OTHER)
Admission: RE | Admit: 2022-01-15 | Discharge: 2022-01-15 | Disposition: A | Discharge status: Home / Self Care | Payer: Medicare Other | Source: Ambulatory Visit

## 2022-01-15 VITALS — BP 128/72 | HR 68 | Temp 97.0°F | Resp 18 | Ht 68.0 in | Wt 206.0 lb

## 2022-01-15 DIAGNOSIS — E7800 Pure hypercholesterolemia, unspecified: Secondary | ICD-10-CM | POA: Insufficient documentation

## 2022-01-15 DIAGNOSIS — M47814 Spondylosis without myelopathy or radiculopathy, thoracic region: Secondary | ICD-10-CM | POA: Insufficient documentation

## 2022-01-15 DIAGNOSIS — C541 Malignant neoplasm of endometrium: Secondary | ICD-10-CM

## 2022-01-15 DIAGNOSIS — R918 Other nonspecific abnormal finding of lung field: Secondary | ICD-10-CM | POA: Diagnosis not present

## 2022-01-15 DIAGNOSIS — C801 Malignant (primary) neoplasm, unspecified: Secondary | ICD-10-CM

## 2022-01-15 DIAGNOSIS — M129 Arthropathy, unspecified: Secondary | ICD-10-CM | POA: Insufficient documentation

## 2022-01-15 DIAGNOSIS — Z803 Family history of malignant neoplasm of breast: Secondary | ICD-10-CM | POA: Insufficient documentation

## 2022-01-15 DIAGNOSIS — I10 Essential (primary) hypertension: Secondary | ICD-10-CM | POA: Insufficient documentation

## 2022-01-15 DIAGNOSIS — Z7901 Long term (current) use of anticoagulants: Secondary | ICD-10-CM | POA: Insufficient documentation

## 2022-01-15 DIAGNOSIS — C549 Malignant neoplasm of corpus uteri, unspecified: Secondary | ICD-10-CM

## 2022-01-15 DIAGNOSIS — C55 Malignant neoplasm of uterus, part unspecified: Secondary | ICD-10-CM | POA: Insufficient documentation

## 2022-01-15 DIAGNOSIS — Z79899 Other long term (current) drug therapy: Secondary | ICD-10-CM | POA: Diagnosis not present

## 2022-01-15 MED ORDER — IOHEXOL 350 MG/ML SOLN
75.0000 mL | Freq: Once | INTRAVENOUS | Status: AC | PRN
  Administered 2022-01-15: 75 mL via INTRAVENOUS

## 2022-01-15 NOTE — Progress Notes (Signed)
GYN Location of Tumor / Histology: Stage IVB carcinosarcoma   Sarah Wu presented with symptoms of: bleeding  Biopsies revealed:   12/31/2021 FINAL MICROSCOPIC DIAGNOSIS:   A. CULDESAC, POSTERIOR ADHESIONS, EXCISION:  -  Serosa and subserosal fibrous soft tissue with focal acute  inflammation, reactive mesothelial hyperplasia and adhesions.  -  Negative for malignancy.   B. UTERUS, CERVIX, BILATERAL TUBE AND OVAARIES, HYSTERECTOMY:   - Carcinosarcoma (malignant mixed Mullerian tumor) with full-thickness  posterior myometrial involvement with focal serosal present and  extension into lower uterine segment and extensively in the cervical  stroma.  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  ypT3a pNn/a pMn/a   Past/Anticipated interventions by Gyn/Onc surgery, if any:  Robotic-assisted laparoscopic total hysterectomy with bilateral salpingo-oophorectomy, lysis of adhesions for approximately 40 minutes, cystoscopy 12/31/2021 by Dr. Berline Lopes.  Past/Anticipated interventions by medical oncology, if any:  s/p 4 cycles carbo/Taxol/dostarlimab 09/24/2021 - 11/27/2021.  To continue adjuvant chemotherapy 01/29/2022.  Weight changes, if any: 25 lbs.  Bowel/Bladder complaints, if any: No  Nausea/Vomiting, if any: No  Pain issues, if any:  9/10 right knee  SAFETY ISSUES: Prior radiation? No Pacemaker/ICD? No Possible current pregnancy? Postmenopausal Is the patient on methotrexate? No  Current Complaints / other details:  Consideration of at least vaginal brachytherapy.

## 2022-01-15 NOTE — Telephone Encounter (Signed)
Vascular ultrasound called with results on Sarah Wu. Negative for DVT on both legs.  Dr. Berline Lopes notified

## 2022-01-15 NOTE — Telephone Encounter (Signed)
Called patient to let her know that no pulmonary embolism or DVT in lower extremities.  Jeral Pinch MD Gynecologic Oncology

## 2022-01-15 NOTE — Progress Notes (Signed)
Lower extremity venous duplex has been completed.   Preliminary results in CV Proc.   Jinny Blossom [place] 01/15/2022 1:06 PM

## 2022-01-15 NOTE — Telephone Encounter (Signed)
Thank you :)

## 2022-01-15 NOTE — Progress Notes (Signed)
Radiation Oncology         [phone] ________________________________  Initial Outpatient Consultation  Name: Sarah Wu MRN: [id number]  Date: 01/15/2022  DOB: 01-Feb-1951  NF:AOZHY, [name], DO  Lafonda Mosses, MD   REFERRING PHYSICIAN: Lafonda Mosses, MD  DIAGNOSIS: The encounter diagnosis was Malignant neoplasm of body of uterus, unspecified site [facility]).  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 female who is accompanied by her two daughters. she is seen as a courtesy of Dr. Berline Lopes for an opinion concerning vaginal brachytherapy as part of management for her recently diagnosed  carcinosarcoma.  The patient has a history of irregular menses that stopped for an undetermined period of time. In 2022, her menses resumed to monthly bleeding lasting about 6-7 days. Per record review, she had a biopsy performed sometime in 2022 which was negative. In June 2023, the patient apparently bled for the entire month. Subsequently, she presented to her gynecologist on 07/30/21 for evaluation of post-menopausal bleeding. Exam performed noted a mass present within the vagina which was thought to be a prolapsing fibroid.   She then underwent pelvic exam under anesthesia with biopsies of the cervical mass on 08/11/21. Exam noted a large cervical mass extending almost into the vagina, and with extension noted into the endocervix on bimanual exam. Biopsy of the cervical mass showed findings consistent with carcinosarcoma.   The patient was then accordingly referred to Dr. Berline Lopes who recommended  proceeding with imaging to rule out metastatic disease, followed by hysterectomy and BSO.  CT of the abdomen and pelvis on 09/01/21 showed: marked enlargement of the uterus measuring 13.7 x 10 x 8 x 10.1 cm, with a inhomogeneous enhancement in the uterus consistent with malignant neoplasm; enlarged lymph nodes in the retroperitoneum in the para-and paracaval regions measuring up to 12 mm in short axis as well as a few enlarged lymph nodes adjacent to the iliac vessels; and mild prominence of the pelvocaliceal system in the left kidney and proximal left ureter concerning for an infiltrative process related to uterine neoplasm.  Pelvic and transvaginal ultrasound on 09/01/21 showed a large central uterine ill-defined mass measuring approximately 7.7 cm in diameter, within an enlarging uterus, likely representing the biopsy-proven neoplasm. [place] also showed a questionable intramural leiomyoma located in the posterior-mid uterus measuring 3.1 cm diameter.  The patient underwent a diagnostic laparoscopy on 09/10/21 which revealed the need for large bowel resection and end ostomy if surgery were to be pursued at that time. Despite plans of hysterectomy with BSO and nodal biopsies in August, the decision was made to abort surgery in favor on neoadjuvant chemotherapy given findings of stage IV disease (and the need for resectioning noted above).   The patient was then referred to Dr. Antionette Poles ([place] in [place]) on 09/12/21 for discuss neoadjuvant treatment options. Following discussion with Dr. Antionette Poles, the patient agreed to proceed with carboplatin and paclitaxel q21 days. The patient was then referred to Dr. Alvy Bimler on 09/17/21 who recommended Dostarlimab in addition to carboplatin and paclitaxel. She received her first cycle of treatment on 09/24/21.    CT CAP on  11/21/21 (s/p 3 cycles of neoadjuvant treatment) showed a positive treatment response, demonstrated by: partial  involution of large malignant appearing uterine mass, and stability to a mild decrease in size of multiple prominent borderline enlarged and mildly enlarged retroperitoneal and pelvic lymph nodes. CT also showed stability of multiple small pulmonary nodules measuring 5 mm or less when compared to 09/01/21 CT. CT otherwise showed no new lymphadenopathy elsewhere in the chest, abdomen, or pelvis.       Given a positive treatment response, the patient was able to proceed with hysterectomy and BSO on 12/31/21 under the care of Dr. Berline Lopes. 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 Excision of the posterior culdesac adhesion showed no evidence of malignancy with serosa, subserosal fibrous soft tissue, focal acute inflammation, reactive mesothelial hyperplasia, and adhesions.     Given cervical involvement and high-risk histology, Dr. Berline Lopes referred the patient to me for consideration of vaginal brachytherapy. The patient will also likely resume chemotherapy in several weeks.   Today she reports to be recovering well from surgery. She is still experiencing some mild pain at the surgical site but is being controlled with alternating tramadol and tylenol. She denies any vaginal bleeding, abdominal pain, bloating, or abnormal discharge.   PREVIOUS RADIATION THERAPY: No  PAST MEDICAL HISTORY:  Past Medical History:  Diagnosis Date   Arthritis    Cancer ([place])    Fibroid tumor    Hx of mammogram 2022   [place]   Hypercholesteremia    Hypertension    Post-menopausal bleeding    Pre-diabetes     PAST SURGICAL HISTORY: Past Surgical History:  Procedure  Laterality Date   EXAMINATION UNDER ANESTHESIA  08/11/2021   with cervical mass biopsies   IR CHEST FLUORO  10/14/2021   IR IMAGING GUIDED PORT INSERTION  09/23/2021   IR IMAGING GUIDED PORT INSERTION  10/17/2021   LAPAROSCOPY N/A 09/10/2021   Procedure: DIAGNOSTIC LAPAROSCOPY;  Surgeon: Lafonda Mosses, MD;  Location: [facility];  Service: Gynecology;  Laterality: N/A;   REPLACEMENT TOTAL KNEE BILATERAL     TUBAL LIGATION      FAMILY HISTORY:  Family History  Problem Relation Age of Onset   Breast cancer Sister        half-sister   Colon cancer Neg Hx    Ovarian cancer Neg Hx    Endometrial cancer Neg Hx    Pancreatic cancer Neg Hx    Prostate cancer Neg Hx     SOCIAL HISTORY:  Social History   Tobacco Use   Smoking status: Never    Passive exposure: Never   Smokeless tobacco: Never  Vaping Use   Vaping Use: Never used  Substance Use Topics   Alcohol use: Never   Drug use: Never    ALLERGIES:  Allergies  Allergen Reactions   Hydrocodone-Acetaminophen Swelling    Swells stomach   Oxycodone-Acetaminophen Swelling    Swells stomach    MEDICATIONS:  Current Outpatient Medications  Medication Sig Dispense Refill   allopurinol (ZYLOPRIM) 300 MG tablet Take 300 mg by mouth daily.     amLODipine (NORVASC) 5 MG tablet Take 5 mg by mouth at bedtime.     atenolol (TENORMIN) 50 MG tablet Take 50 mg by mouth  daily.     atorvastatin (LIPITOR) 10 MG tablet Take 10 mg by mouth at bedtime.     dexamethasone (DECADRON) 4 MG tablet Take 2 tablets by mouth the night before and 2 tablets the morning of chemotherapy, every 3 weeks, for 6 cycles 36 tablet 6   ferrous sulfate 325 (65 FE) MG tablet Take 325 mg by mouth daily.     furosemide (LASIX) 20 MG tablet Take 1 tablet (20 mg total) by mouth daily. 30 tablet 0   lidocaine-prilocaine (EMLA) cream Apply to affected area once 30 g 3   Multiple Vitamins-Minerals (MULTIVITAMIN WITH MINERALS) tablet Take 1 tablet by mouth at bedtime.  Centrum     ondansetron (ZOFRAN) 8 MG tablet Take 1 tablet by mouth every 8 hours as needed for nausea or vomiting. Start on the third day after chemotherapy. 30 tablet 1   polyethylene glycol (MIRALAX / GLYCOLAX) 17 g packet Take 17 g by mouth daily.     prochlorperazine (COMPAZINE) 10 MG tablet Take 1 tablet (10 mg total) by mouth every 6 (six) hours as needed for nausea or vomiting. 30 tablet 1   rivaroxaban (XARELTO) 10 MG TABS tablet Take 1 tablet (10 mg total) by mouth daily. For AFTER surgery, do not start until Saturday morning (November 25). Take at the same time each day once a day. 14 tablet 0   senna-docusate (SENOKOT-S) 8.6-50 MG tablet Take 2 tablets by mouth 2 (two) times daily. For AFTER surgery, do not take if having loose stools 30 tablet 0   traMADol (ULTRAM) 50 MG tablet Take 1-2 tablets (50-100 mg total) by mouth every 6 (six) hours as needed for severe pain. For AFTER surgery, do not take and drive 15 tablet 0   zinc gluconate 50 MG tablet Take 50 mg by mouth daily.     No current facility-administered medications for this encounter.    REVIEW OF SYSTEMS:  A 10+ POINT REVIEW OF SYSTEMS WAS OBTAINED including neurology, dermatology, psychiatry, cardiac, respiratory, lymph, extremities, GI, GU, musculoskeletal, constitutional, reproductive, HEENT.    PHYSICAL EXAM:  height is '5\' 8"'$  (1.727 m) and weight is 206 lb (93.4 kg). Her temporal temperature is 97 F (36.1 C) (abnormal). Her blood pressure is 128/72 and her pulse is 68. Her respiration is 18 and oxygen saturation is 100%.   General: Alert and oriented, in no acute distress HEENT: Head is normocephalic. Extraocular movements are intact.  Neck: Neck is supple, no palpable cervical or supraclavicular lymphadenopathy. Heart: Regular in rate and rhythm with no murmurs, rubs, or gallops. Chest: Clear to auscultation bilaterally, with no rhonchi, wheezes, or rales. Abdomen: Soft, nontender, nondistended, with no rigidity or  guarding. Extremities: No cyanosis or edema. Lymphatics: see Neck Exam Skin: No concerning lesions. Musculoskeletal: symmetric strength and muscle tone throughout. Neurologic: Cranial nerves II through XII are grossly intact. No obvious focalities. Speech is fluent. Coordination is intact. Psychiatric: Judgment and insight are intact. Affect is appropriate.  Pelvic exam not performed today.  Will be performed at the time of her brachytherapy planning and treatment   ECOG = 2  0 - Asymptomatic (Fully active, able to carry on all predisease activities without restriction)  1 - Symptomatic but completely ambulatory (Restricted in physically strenuous activity but ambulatory and able to carry out work of a light or sedentary nature. For example, light housework, office work)  2 - Symptomatic, <50% in bed during the day (Ambulatory and capable of all self care but unable to  carry out any work activities. Up and about more than 50% of waking hours)  3 - Symptomatic, >50% in bed, but not bedbound (Capable of only limited self-care, confined to bed or chair 50% or more of waking hours)  4 - Bedbound (Completely disabled. Cannot carry on any self-care. Totally confined to bed or chair)  5 - Death   Eustace Pen MM, Creech RH, Tormey DC, et al. [phone]). "Toxicity and response criteria of the [facility]". [place] Oncol. 5 (6): 649-55  LABORATORY DATA:  Lab Results  Component Value Date   WBC 6.0 01/01/2022   HGB 9.3 (L) 01/01/2022   HCT 30.5 (L) 01/01/2022   MCV 90.5 01/01/2022   PLT 248 01/01/2022   NEUTROABS 6.0 11/27/2021   Lab Results  Component Value Date   NA 138 01/01/2022   K 3.7 01/01/2022   CL 107 01/01/2022   CO2 23 01/01/2022   GLUCOSE 148 (H) 01/01/2022   BUN 7 (L) 01/01/2022   CREATININE 0.69 01/01/2022   CALCIUM 8.9 01/01/2022      RADIOGRAPHY: CT Angio Chest Pulmonary Embolism (PE) W or WO Contrast  Result Date: 01/15/2022 CLINICAL DATA:   Uterine/cervical cancer. Pulmonary nodules; questionable pulmonary embolus on prior non angiogram chest CT scan of 11/21/2021. * Tracking Code: BO * EXAM: CT ANGIOGRAPHY CHEST WITH CONTRAST TECHNIQUE: Multidetector CT imaging of the chest was performed using the standard protocol during bolus administration of intravenous contrast. Multiplanar CT image reconstructions and MIPs were obtained to evaluate the vascular anatomy. RADIATION DOSE REDUCTION: This exam was performed according to the departmental dose-optimization program which includes automated exposure control, adjustment of the mA and/or kV according to patient size and/or use of iterative reconstruction technique. CONTRAST:  30m OMNIPAQUE IOHEXOL 350 MG/ML SOLN COMPARISON:  11/21/2021 FINDINGS: Cardiovascular: No filling defect is identified in the pulmonary arterial tree to suggest pulmonary embolus. Right Port-A-Cath tip: SVC. Mediastinum/Nodes: Unremarkable Lungs/Pleura: Small calcified granuloma in the posterior basal segment right lower lobe image 81 series 7. There are few scattered nodules in the 3-4 mm range, 1 of the larger nodules is a 3 by 4 mm left apical nodule on image 27 series 7 which is not changed from 11/21/2021. Likewise the other small nodules are not appreciably changed. The nodules along the lung bases are not changed from the earlier cross-sectional imaging of 09/01/2021. Upper Abdomen: Subcutaneous gas along the left abdomen at the level of the kidneys, without thick enhancing margins, image 98 series 5. This is only partially included on today's exam. Reportedly the patient had laparoscopic hysterectomy 15 days ago, although this amount of gas is more than I would expect over 2 weeks out from surgery. Correlate with any regional symptoms. We only partially included this process as it is an abdominal process and today's CT scan was a chest CT. Small incidental lipoma or fatty polyp along the gastric antrum, image 79 series 5.  Musculoskeletal: At the bottom most margin of imaging we demonstrate a densely sclerotic 1.7 by 1.1 by 1.5 cm lesion in the L1 vertebral body abutting the inferior endplate. This lesion was not visible or present on 09/01/2021 and I do not see an obvious underlying Schmorl's node. Accordingly this lesion is suspicious for oligometastatic disease to the skeleton, with sclerosis resulting from the neoadjuvant chemotherapy that the patient received. Thoracic spondylosis. Review of the MIP images confirms the above findings. IMPRESSION: 1. No filling defect is identified in the pulmonary arterial tree to suggest pulmonary embolus.  2. Subcutaneous gas along the left abdomen at the level of the kidneys, only partially included on today's CT scan of the chest. This amount of gas is more than I would expect over 2 weeks out from surgery. Correlate with any regional symptoms in determining whether further workup is indicated. 3. 1.7 by 1.1 by 1.5 cm densely sclerotic lesion in the L1 vertebral body abutting the inferior endplate. This lesion was not visible or present on 09/01/2021 and I do not see an obvious underlying Schmorl's node or other benign explanation as to why this sclerotic lesion would have formed. Accordingly this lesion is suspicious for oligometastatic disease to the skeleton, with sclerosis resulting from the neoadjuvant chemotherapy that the patient received. The sclerosis may well represent an indicator of effective therapy. 4. Scattered small (2-4 mm diameter) pulmonary nodules are stable from 11/21/2021. These are likely benign but merits surveillance given the clinical context. 5. Small incidental lipoma or fatty polyp along the gastric antrum. Likely clinically inconsequential. Electronically Signed   By: Van Clines M.D.   On: 01/15/2022 14:24   VAS [place] LOWER EXTREMITY VENOUS (DVT)  Result Date: 01/15/2022  Lower Venous DVT Study Patient Name:  Sarah Wu  Date of Exam:   01/15/2022  Medical Rec #: [id number]           Accession #:    [id number] Date of Birth: 08-Jul-1950           Patient Gender: F Patient Age:   53 years Exam Location:  [facility] Procedure:      VAS [place] LOWER EXTREMITY VENOUS (DVT) Referring Phys: Jeral Pinch --------------------------------------------------------------------------------  Indications: Carcinosarcoma.  Comparison Study: no prior Performing Technologist: Archie Patten RVS  Examination Guidelines: A complete evaluation includes B-mode imaging, spectral Doppler, color Doppler, and power Doppler as needed of all accessible portions of each vessel. Bilateral testing is considered an integral part of a complete examination. Limited examinations for reoccurring indications may be performed as noted. The reflux portion of the exam is performed with the patient in reverse Trendelenburg.  +---------+---------------+---------+-----------+----------+-------------------+ RIGHT    CompressibilityPhasicitySpontaneityPropertiesThrombus Aging      +---------+---------------+---------+-----------+----------+-------------------+ CFV      Full           Yes      Yes                                      +---------+---------------+---------+-----------+----------+-------------------+ SFJ      Full                                                             +---------+---------------+---------+-----------+----------+-------------------+ FV Prox  Full                                                             +---------+---------------+---------+-----------+----------+-------------------+ FV Mid   Full                                                             +---------+---------------+---------+-----------+----------+-------------------+  FV DistalFull                                                             +---------+---------------+---------+-----------+----------+-------------------+ PFV      Full                                                              +---------+---------------+---------+-----------+----------+-------------------+ POP      Full           Yes      Yes                                      +---------+---------------+---------+-----------+----------+-------------------+ PTV      Full           Yes      Yes                                      +---------+---------------+---------+-----------+----------+-------------------+ PERO                                                  Not well visualized +---------+---------------+---------+-----------+----------+-------------------+   +---------+---------------+---------+-----------+----------+-------------------+ LEFT     CompressibilityPhasicitySpontaneityPropertiesThrombus Aging      +---------+---------------+---------+-----------+----------+-------------------+ CFV      Full           Yes      Yes                                      +---------+---------------+---------+-----------+----------+-------------------+ SFJ      Full                                                             +---------+---------------+---------+-----------+----------+-------------------+ FV Prox  Full                                                             +---------+---------------+---------+-----------+----------+-------------------+ FV Mid   Full                                                             +---------+---------------+---------+-----------+----------+-------------------+ FV DistalFull                                                             +---------+---------------+---------+-----------+----------+-------------------+  PFV      Full                                                             +---------+---------------+---------+-----------+----------+-------------------+ POP      Full           Yes      Yes                                       +---------+---------------+---------+-----------+----------+-------------------+ PTV      Full                                                             +---------+---------------+---------+-----------+----------+-------------------+ PERO                                                  Not well visualized +---------+---------------+---------+-----------+----------+-------------------+     Summary: BILATERAL: - No evidence of deep vein thrombosis seen in the lower extremities, bilaterally. -No evidence of popliteal cyst, bilaterally.   *See table(s) above for measurements and observations.    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 Given the pathologic findings, she is a good candidate for HDR therapy. I recommend 5 weekly treatments of HDR therapy to the upper part of the vaginal cavity to reduce chances of recurrence along the vaginal cuff.   Today, I talked to the patient and her daughters about the findings and work-up thus far.  We discussed the natural history of stage IV carcinosarcoma of the uterus with 100% mymoetrial invasion and cervical involvement and general treatment, highlighting the role of radiotherapy in the management.  We discussed the available radiation techniques, and focused on the details of logistics and delivery.  We reviewed the anticipated acute and late sequelae associated with radiation in this setting.  The patient was encouraged to ask questions that I answered to the best of my ability. The patient would like to proceed with radiation and will be scheduled for CT simulation. She is only 2 weeks out from surgery, so will need to wait 4 more weeks to begin radiation treatment as radiation would delay wound healing.    PLAN: Will plan to schedule CT simulation in approximately 3-4 weeks. 5 weekly treatments to begin after simulation. Will continue with chemotherapy during radiation therapy.  Anticipate 5 high-dose-rate treatments directed at the vaginal cuff.  Iridium 192 as high-dose-rate source.     She is scheduled for her 5th cycle of chemotherapy on 01/29/22 and post-op appointment with Dr. Berline Lopes on 01/30/22.   60 minutes of total time was spent for this patient encounter, including preparation, face-to-face counseling with the patient and coordination of  care, physical exam, and documentation of the encounter.   ------------------------------------------------   Leona Singleton, PA   Blair Promise, PhD, MD  This document serves as a record of services personally performed by Gery Pray, MD. It was created on his behalf by Roney Mans, a trained medical scribe. The creation of this record is based on the scribe's personal observations and the provider's statements to them. This document has been checked and approved by the attending provider.

## 2022-01-19 ENCOUNTER — Telehealth: Payer: Self-pay | Admitting: Surgery

## 2022-01-19 NOTE — Telephone Encounter (Addendum)
Patient called in stating she is experiencing pain in her L side when she stands up. States pain is mainly where Dr Berline Lopes operated. Pain came on about 4 days ago, patient rates pain 6/10 but states she has no pain when sitting or lying down. Patient states she doesn't do much around the house, that her family has been taking care of her. Advised patient that if tolerated, it is good to be up and moving as much as she can, to avoid deconditioning. Patient denies fever, chills, N/V, diarrhea, constipation, vaginal bleeding, dysuria. Spoke with daughter, [name], and reiterated the same information as well. Of note, daughter requested pharmacy be changed to [facility] since they will deliver medications to her house. Dr Berline Lopes notified of pain patient experiencing.

## 2022-01-20 ENCOUNTER — Other Ambulatory Visit: Payer: Self-pay

## 2022-01-20 ENCOUNTER — Telehealth: Payer: Self-pay | Admitting: *Deleted

## 2022-01-20 NOTE — Telephone Encounter (Signed)
Spoke with Sarah Wu to f/u regarding her pain. She reports that she took tylenol last night before bed and is feeling better this morning. She hasn't taken anything for pain this morning because she is not having the pain. I encouraged to increase her activity as tolerated and call our office with any further questions or concerns.

## 2022-01-23 ENCOUNTER — Encounter: Payer: Medicare Other | Admitting: Gynecologic Oncology

## 2022-01-26 ENCOUNTER — Other Ambulatory Visit: Payer: Self-pay | Admitting: Oncology

## 2022-01-26 NOTE — Progress Notes (Signed)
Gynecologic Oncology Multi-Disciplinary Disposition Conference Note  Date of the Conference: 01/26/2022  Patient Name: Sarah Wu  Referring Provider: Primary GYN Oncologist:   Stage/Disposition:  Stage IV carcinosarcoma of the uterus. Disposition is to continue adjuvant chemotherapy followed by reimaging and consideration for palliative radiation.   This Multidisciplinary conference took place involving physicians from [facility], Medical Oncology, Radiation Oncology, Pathology, Radiology along with the Gynecologic Oncology Nurse Practitioner and Gynecologic Oncology Nurse Navigator.  Comprehensive assessment of the patient's malignancy, staging, need for surgery, chemotherapy, radiation therapy, and need for further testing were reviewed. Supportive measures, both inpatient and following discharge were also discussed. The recommended plan of care is documented. Greater than 35 minutes were spent correlating and coordinating this patient's care.

## 2022-01-28 MED FILL — Fosaprepitant Dimeglumine For IV Infusion 150 MG (Base Eq): INTRAVENOUS | Qty: 5 | Status: AC

## 2022-01-28 MED FILL — Dexamethasone Sodium Phosphate Inj 100 MG/10ML: INTRAMUSCULAR | Qty: 1 | Status: AC

## 2022-01-29 ENCOUNTER — Other Ambulatory Visit: Payer: Self-pay | Admitting: Hematology and Oncology

## 2022-01-29 ENCOUNTER — Inpatient Hospital Stay: Payer: Medicare Other

## 2022-01-29 ENCOUNTER — Encounter: Payer: Self-pay | Admitting: Hematology and Oncology

## 2022-01-29 ENCOUNTER — Inpatient Hospital Stay: Payer: Medicare Other | Attending: Gynecologic Oncology | Admitting: Hematology and Oncology

## 2022-01-29 VITALS — BP 140/83 | HR 83 | Temp 97.4°F | Resp 18 | Ht 68.0 in | Wt 207.0 lb

## 2022-01-29 DIAGNOSIS — C549 Malignant neoplasm of corpus uteri, unspecified: Secondary | ICD-10-CM

## 2022-01-29 DIAGNOSIS — C801 Malignant (primary) neoplasm, unspecified: Secondary | ICD-10-CM

## 2022-01-29 DIAGNOSIS — C55 Malignant neoplasm of uterus, part unspecified: Secondary | ICD-10-CM | POA: Insufficient documentation

## 2022-01-29 DIAGNOSIS — D638 Anemia in other chronic diseases classified elsewhere: Secondary | ICD-10-CM | POA: Diagnosis not present

## 2022-01-29 DIAGNOSIS — Z79899 Other long term (current) drug therapy: Secondary | ICD-10-CM | POA: Diagnosis not present

## 2022-01-29 LAB — CBC WITH DIFFERENTIAL (CANCER CENTER ONLY)
Abs Immature Granulocytes: 0 10*3/uL (ref 0.00–0.07)
Basophils Absolute: 0 10*3/uL (ref 0.0–0.1)
Basophils Relative: 0 %
Eosinophils Absolute: 0 10*3/uL (ref 0.0–0.5)
Eosinophils Relative: 0 %
HCT: 34.9 % — ABNORMAL LOW (ref 36.0–46.0)
Hemoglobin: 11.2 g/dL — ABNORMAL LOW (ref 12.0–15.0)
Immature Granulocytes: 0 %
Lymphocytes Relative: 17 %
Lymphs Abs: 0.7 10*3/uL (ref 0.7–4.0)
MCH: 28.5 pg (ref 26.0–34.0)
MCHC: 32.1 g/dL (ref 30.0–36.0)
MCV: 88.8 fL (ref 80.0–100.0)
Monocytes Absolute: 0.1 10*3/uL (ref 0.1–1.0)
Monocytes Relative: 2 %
Neutro Abs: 3.3 10*3/uL (ref 1.7–7.7)
Neutrophils Relative %: 81 %
Platelet Count: 261 10*3/uL (ref 150–400)
RBC: 3.93 MIL/uL (ref 3.87–5.11)
RDW: 16.9 % — ABNORMAL HIGH (ref 11.5–15.5)
WBC Count: 4.1 10*3/uL (ref 4.0–10.5)
nRBC: 0 % (ref 0.0–0.2)

## 2022-01-29 LAB — CMP (CANCER CENTER ONLY)
ALT: 9 U/L (ref 0–44)
AST: 20 U/L (ref 15–41)
Albumin: 4.1 g/dL (ref 3.5–5.0)
Alkaline Phosphatase: 48 U/L (ref 38–126)
Anion gap: 9 (ref 5–15)
BUN: 12 mg/dL (ref 8–23)
CO2: 27 mmol/L (ref 22–32)
Calcium: 10 mg/dL (ref 8.9–10.3)
Chloride: 104 mmol/L (ref 98–111)
Creatinine: 0.66 mg/dL (ref 0.44–1.00)
GFR, Estimated: >60 mL/min (ref >60)
Glucose, Bld: 150 mg/dL — ABNORMAL HIGH (ref 70–99)
Potassium: 3.7 mmol/L (ref 3.5–5.1)
Sodium: 140 mmol/L (ref 135–145)
Total Bilirubin: 0.5 mg/dL (ref 0.3–1.2)
Total Protein: 7.5 g/dL (ref 6.5–8.1)

## 2022-01-29 LAB — SAMPLE TO BLOOD BANK

## 2022-01-29 MED ORDER — SODIUM CHLORIDE 0.9 % IV SOLN
500.0000 mg | Freq: Once | INTRAVENOUS | Status: AC
  Administered 2022-01-29: 500 mg via INTRAVENOUS
  Filled 2022-01-29: qty 10

## 2022-01-29 MED ORDER — CETIRIZINE HCL 10 MG/ML IV SOLN
10.0000 mg | Freq: Once | INTRAVENOUS | Status: AC
  Administered 2022-01-29: 10 mg via INTRAVENOUS
  Filled 2022-01-29: qty 1

## 2022-01-29 MED ORDER — SODIUM CHLORIDE 0.9 % IV SOLN
500.0000 mg | Freq: Once | INTRAVENOUS | Status: AC
  Administered 2022-01-29: 500 mg via INTRAVENOUS
  Filled 2022-01-29: qty 50

## 2022-01-29 MED ORDER — SODIUM CHLORIDE 0.9% FLUSH
10.0000 mL | INTRAVENOUS | Status: DC | PRN
  Administered 2022-01-29: 10 mL

## 2022-01-29 MED ORDER — SODIUM CHLORIDE 0.9 % IV SOLN: Freq: Once | INTRAVENOUS | Status: AC

## 2022-01-29 MED ORDER — HEPARIN SOD (PORK) LOCK FLUSH 100 UNIT/ML IV SOLN
500.0000 [IU] | Freq: Once | INTRAVENOUS | Status: AC | PRN
  Administered 2022-01-29: 500 [IU]

## 2022-01-29 MED ORDER — FAMOTIDINE IN NACL 20-0.9 MG/50ML-% IV SOLN
20.0000 mg | Freq: Once | INTRAVENOUS | Status: AC
  Administered 2022-01-29: 20 mg via INTRAVENOUS
  Filled 2022-01-29: qty 50

## 2022-01-29 MED ORDER — SODIUM CHLORIDE 0.9 % IV SOLN
140.0000 mg/m2 | Freq: Once | INTRAVENOUS | Status: AC
  Administered 2022-01-29: 294 mg via INTRAVENOUS
  Filled 2022-01-29: qty 49

## 2022-01-29 MED ORDER — SODIUM CHLORIDE 0.9 % IV SOLN
150.0000 mg | Freq: Once | INTRAVENOUS | Status: AC
  Administered 2022-01-29: 150 mg via INTRAVENOUS
  Filled 2022-01-29: qty 150

## 2022-01-29 MED ORDER — SODIUM CHLORIDE 0.9% FLUSH
10.0000 mL | Freq: Once | INTRAVENOUS | Status: AC
  Administered 2022-01-29: 10 mL

## 2022-01-29 MED ORDER — PALONOSETRON HCL INJECTION 0.25 MG/5ML
0.2500 mg | Freq: Once | INTRAVENOUS | Status: AC
  Administered 2022-01-29: 0.25 mg via INTRAVENOUS
  Filled 2022-01-29: qty 5

## 2022-01-29 MED ORDER — SODIUM CHLORIDE 0.9 % IV SOLN
10.0000 mg | Freq: Once | INTRAVENOUS | Status: AC
  Administered 2022-01-29: 10 mg via INTRAVENOUS
  Filled 2022-01-29: qty 10

## 2022-01-29 NOTE — Assessment & Plan Note (Addendum)
She is recovering well from surgery  I have reviewed surgical report and pathology report I recommend 3 more cycles of treatment after surgery before repeating another CT imaging Beyond that, she will receive maintenance immunotherapy We will resume chemotherapy as scheduled today

## 2022-01-29 NOTE — Patient Instructions (Signed)
[facility]  Discharge Instructions: Thank you for choosing [facility] to provide your oncology and hematology care.   If you have a lab appointment with the [facility], please go directly to the [facility] and check in at the registration area.   Wear comfortable clothing and clothing appropriate for easy access to any Portacath or PICC line.   We strive to give you quality time with your provider. You may need to reschedule your appointment if you arrive late (15 or more minutes).  Arriving late affects you and other patients whose appointments are after yours.  Also, if you miss three or more appointments without notifying the office, you may be dismissed from the clinic at the provider's discretion.      For prescription refill requests, have your pharmacy contact our office and allow 72 hours for refills to be completed.    Today you received the following chemotherapy and/or immunotherapy agents jemperli, paclitaxol, carboplatin      To help prevent nausea and vomiting after your treatment, we encourage you to take your nausea medication as directed.  BELOW ARE SYMPTOMS THAT SHOULD BE REPORTED IMMEDIATELY: *FEVER GREATER THAN 100.4 F (38 C) OR HIGHER *CHILLS OR SWEATING *NAUSEA AND VOMITING THAT IS NOT CONTROLLED WITH YOUR NAUSEA MEDICATION *UNUSUAL SHORTNESS OF BREATH *UNUSUAL BRUISING OR BLEEDING *URINARY PROBLEMS (pain or burning when urinating, or frequent urination) *BOWEL PROBLEMS (unusual diarrhea, constipation, pain near the anus) TENDERNESS IN MOUTH AND THROAT WITH OR WITHOUT PRESENCE OF ULCERS (sore throat, sores in mouth, or a toothache) UNUSUAL RASH, SWELLING OR PAIN  UNUSUAL VAGINAL DISCHARGE OR ITCHING   Items with * indicate a potential emergency and should be followed up as soon as possible or go to the Emergency Department if any problems should occur.  Please show the CHEMOTHERAPY ALERT CARD or IMMUNOTHERAPY  ALERT CARD at check-in to the Emergency Department and triage nurse.  Should you have questions after your visit or need to cancel or reschedule your appointment, please contact [facility]  Dept: [phone]  and follow the prompts.  Office hours are 8:00 a.m. to 4:30 p.m. Monday - Friday. Please note that voicemails left after 4:00 p.m. may not be returned until the following business day.  We are closed weekends and major holidays. You have access to a nurse at all times for urgent questions. Please call the main number to the clinic Dept: [phone] and follow the prompts.   For any non-urgent questions, you may also contact your provider using MyChart. We now offer e-Visits for anyone 92 and older to request care online for non-urgent symptoms. For details visit [email or web address].   Also download the MyChart app! Go to the app store, search "MyChart", open the app, select [facility], and log in with your MyChart username and password.  Masks are optional in the cancer centers. If you would like for your care team to wear a mask while they are taking care of you, please let them know. You may have one support person who is at least 72 years old accompany you for your appointments.

## 2022-01-29 NOTE — Assessment & Plan Note (Signed)

## 2022-01-29 NOTE — Progress Notes (Signed)
[place] OFFICE PROGRESS NOTE  Patient Care Team: Levin Erp, DO as PCP - General (Family Medicine)  ASSESSMENT & PLAN:  Uterine cancer [facility]) She is recovering well from surgery  I have reviewed surgical report and pathology report I recommend 3 more cycles of treatment after surgery before repeating another CT imaging Beyond that, she will receive maintenance immunotherapy We will resume chemotherapy as scheduled today  Anemia, chronic disease This is likely due to recent treatment. The patient denies recent history of bleeding such as epistaxis, hematuria or hematochezia. She is asymptomatic from the anemia. I will observe for now.  She does not require transfusion now. I will continue the chemotherapy at current dose without dosage adjustment.  If the anemia gets progressive worse in the future, I might have to delay her treatment or adjust the chemotherapy dose.   No orders of the defined types were placed in this encounter.   All questions were answered. The patient knows to call the clinic with any problems, questions or concerns. The total time spent in the appointment was 30 minutes encounter with patients including review of chart and various tests results, discussions about plan of care and coordination of care plan   Sarah Lark, MD 01/29/2022 10:35 AM  INTERVAL HISTORY: Please see below for problem oriented charting. she returns for treatment follow-up  REVIEW OF SYSTEMS:   Constitutional: Denies fevers, chills or abnormal weight loss Eyes: Denies blurriness of vision Ears, nose, mouth, throat, and face: Denies mucositis or sore throat Respiratory: Denies cough, dyspnea or wheezes Cardiovascular: Denies palpitation, chest discomfort or lower extremity swelling Gastrointestinal:  Denies nausea, heartburn or change in bowel habits Skin: Denies abnormal skin rashes Lymphatics: Denies new lymphadenopathy or easy bruising Neurological:Denies  numbness, tingling or new weaknesses Behavioral/Psych: Mood is stable, no new changes  All other systems were reviewed with the patient and are negative.  I have reviewed the past medical history, past surgical history, social history and family history with the patient and they are unchanged from previous note.  ALLERGIES:  is allergic to hydrocodone-acetaminophen and oxycodone-acetaminophen.  MEDICATIONS:  Current Outpatient Medications  Medication Sig Dispense Refill   allopurinol (ZYLOPRIM) 300 MG tablet Take 300 mg by mouth daily.     amLODipine (NORVASC) 5 MG tablet Take 5 mg by mouth at bedtime.     atenolol (TENORMIN) 50 MG tablet Take 50 mg by mouth daily.     atorvastatin (LIPITOR) 10 MG tablet Take 10 mg by mouth at bedtime.     dexamethasone (DECADRON) 4 MG tablet Take 2 tablets by mouth the night before and 2 tablets the morning of chemotherapy, every 3 weeks, for 6 cycles 36 tablet 6   ferrous sulfate 325 (65 FE) MG tablet Take 325 mg by mouth daily.     furosemide (LASIX) 20 MG tablet Take 1 tablet (20 mg total) by mouth daily. 30 tablet 0   lidocaine-prilocaine (EMLA) cream Apply to affected area once 30 g 3   Multiple Vitamins-Minerals (MULTIVITAMIN WITH MINERALS) tablet Take 1 tablet by mouth at bedtime. Centrum     ondansetron (ZOFRAN) 8 MG tablet Take 1 tablet by mouth every 8 hours as needed for nausea or vomiting. Start on the third day after chemotherapy. 30 tablet 1   polyethylene glycol (MIRALAX / GLYCOLAX) 17 g packet Take 17 g by mouth daily.     prochlorperazine (COMPAZINE) 10 MG tablet Take 1 tablet (10 mg total) by mouth every 6 (six) hours  as needed for nausea or vomiting. 30 tablet 1   senna-docusate (SENOKOT-S) 8.6-50 MG tablet Take 2 tablets by mouth 2 (two) times daily. For AFTER surgery, do not take if having loose stools 30 tablet 0   traMADol (ULTRAM) 50 MG tablet Take 1-2 tablets (50-100 mg total) by mouth every 6 (six) hours as needed for severe pain.  For AFTER surgery, do not take and drive 15 tablet 0   zinc gluconate 50 MG tablet Take 50 mg by mouth daily.     No current facility-administered medications for this visit.    SUMMARY OF ONCOLOGIC HISTORY: Oncology History Overview Note  Outside path showed carcinosarcoma, PD-L1 positive, MSI stable   Uterine cancer ([place])  07/30/2021 Initial Diagnosis   She was evaluated for post-menopausal bleeding. She reported having been seen a year previously and having a biopsy that was negative.  On exam in clinic, she was noted to have a mass present within the vagina.  This was thought to be a prolapsing fibroid and plan was made for exam under anesthesia, removal prolapsing fibroid with hysteroscopy and D&C   08/11/2021 Pathology Results   PD-L1 [phone]):                                             Does meet FDA-approval, Combined Positive Score (CPS): >=1   Pan-TRK IHC:                                              Tumor cells are Focally Positive for TRK protein expression.   MICROSATELLITE INSTABILITY:             MS-Stable   TUMOR MUTATION BURDEN:                10.8 Muts/Mb TMB-Low   OTHER BIOMARKERS:                              MET amplification PIK3CA amplification MDM2 amplification CCNE1 amplification   Pertinent Negative Biomarkers Evaluated by NGS: ALK ATM BARD1 BRAF BRCA1 BRCA2 CDK12 CHECK1 CHEK2 EGFR  ERBB2 ESR1 FANCL FGFR2 FGFR3 IDH1 IDH2 KIT KRAS NRAS NTRK1 NTRK2 NTRK3 PALB2 PDGFRA RAD51B RAD51C  RAD51D RAD54L RET ROS1   Comment:  The above studies are performed and reported by [facility] [place]) as part of the [employer] (NGS) panel.  Pertinent results are summarized above.  Please see their separate reports for more detailed information  Addendum electronically signed by Sarah Soho, MD on 09/03/2021 at  7:37 AM  Final Diagnosis    A.  CERVICAL MASS, EXCISION:       - CONSISTENT WITH CARCINOSARCOMA (HIGH GRADE MALIGNANT BIPHASIC TUMOR)  Electronically signed by  Sarah Soho, MD on 08/15/2021 at  9:53 AM  Comment    The malignant epithelial component exhibits high-grade cytomorphology and immunostaining pattern consistent with serous carcinoma to include strong positive diffuse reactivity for p53, p16, and vimentin, focal subset CK7 and CK AE1/AE3 expression, with high Ki-67 staining (approximately 60%).  Neuroendocrine differentiation is present (CD56 negative but synaptophysin positive).  The stromal component is composed of malignant mitotically active cells and heterologous elements with focal chondroid differentiation (S100 positive).  SMA  appears to decorate rare tumor cells and highlights vascular structures.  The tumor cells are non-reactive for CK20, WT1, GATA3, p40 and desmin.  Block A5 is [facility] to [facility] [place] to perform molecular characterization.  These findings are shared with Dr. Luanne Bras on [phone] at [phone] hours by telephone conversation. This case received prospective intradepartmental quality assurance review.   Clinical Information    Cervical mass  Gross Description    A. Cervix Received fresh labeled with the patient's name and date of birth and "cervical mass frozen section" per container is an aggregate of red-tan friable soft tissue measuring approximately 5 x 5 x 3 cm in toto.  Representative sections are frozen for an intraoperative consultation.  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   Results rendered by Dr. Wyatt Portela and reported to Dr. Macarthur Critchley at [facility] on 08/11/2021 at 8:06 AM.  Microscopic Description    Microscopic examination supports the above diagnosis.  Single antibody immunostains are performed to include: S100, p40, p16, p53, PAX8, CK7, CK20, vimentin, CK  AE1/3, GATA3, WT1, desmin, SMA, synaptophysin, CD56, and Ki-67.  All controls are appropriately reactive. [id number], [id number], [id number], [id number], [id number], [id number] (15)          08/22/2021 Initial Diagnosis   Carcinosarcoma ([place])   09/01/2021 Imaging   CT abdomen and pelvis There is marked enlargement of the uterus measuring 13.7 x 10 x 8 x 10.1 cm. There is inhomogeneous enhancement in the uterus consistent with malignant neoplasm.   There are enlarged lymph nodes in the retroperitoneum in the para-and paracaval regions measuring up to 12 mm in short axis. There are a few enlarged lymph nodes adjacent to the iliac vessels largest measuring 2.6 x 1.4 cm adjacent to the right external iliac vessels. Findings suggest possible metastatic lymphadenopathy. There is mild prominence of the pelvocaliceal system in the left kidney and proximal left ureter. Possibility of extrinsic compression or infiltrative process related to the uterine neoplasm in the distal course of left ureter causing mild obstruction is not excluded.   Gallbladder stone.  Diverticulosis of colon.   Other findings as described in the body of the report.   09/01/2021 Imaging   US pelvis Large central uterine ill-defined mass approximately 7.7 cm diameter enlarging uterus likely representing the biopsy-proven neoplasm, likely of endometrial origin.   Questionable intramural leiomyoma posterior mid uterus 3.1 cm diameter.   The mass extend and the adnexal regions are poorly characterized but may potentially be better evaluated by MR imaging with and without contrast.   09/10/2021 Surgery   Pre-operative Diagnosis: carcinosarcoma of the uterus, adenopathy on imaging   Post-operative Diagnosis: same, stage IVB carcinosarcoma of the uterus   Operation: Diagnostic laparoscopy    Surgeon: Jeral Pinch MD Operative Findings: On EUA, cervix 2 cm dilated with tumor noted within the os. Cervix otherwise normal appearing. Uterus enlarged, 12 cm,  with limited mobility secondary to weight. Compression of the rectum from uterus although no direct invasion appreciated. On intra-abdominal inspection, adhesions noted between the liver and the anterior abdominal wall. Evidence of carcinomatosis involving liver surface, diaphragm, omentum, anterior abdominal wall. Surface of the uterus with obvious tumor infiltration. Small bowel free from uterus. Posteriorly, sigmoid densely adherent to the posterior uterus. Even with manipulator, unable to move the uterus  anteriorly. Minimal ascites.    Given findings of stage IV disease and what would require large bowel resection and end ostomy, decision made to abort surgery in favor of neoadjuvant chemotherapy. I had discussed this as a distinct possibility when we had gotten her CT results as well as this morning prior to the surgery.    09/15/2021 Cancer Staging   Staging form: Corpus Uteri - Carcinoma and Carcinosarcoma, AJCC 8th Edition - Clinical stage from 09/15/2021: FIGO Stage IVB (cT3, cN2a, pM1) - Signed by Sarah Lark, MD on 09/15/2021 Stage prefix: Initial diagnosis   09/24/2021 -  Chemotherapy   Patient is on Treatment Plan : UTERINE ENDOMETRIAL Dostarlimab-gxly (500 mg) + Carboplatin (AUC 5) + Paclitaxel (175 mg/m2) q21d x 6 cycles / Dostarlimab-gxly (1000 mg) q42d x 6 cycles      10/17/2021 Procedure   Status post revision of nonfunctional right IJ port catheter, with new right IJ port catheter placed. Port is ready for use.     11/24/2021 Imaging   1. Today's study demonstrates a positive response to therapy with partial involution of large malignant appearing uterine mass. 2. Multiple prominent borderline enlarged and mildly enlarged retroperitoneal and pelvic lymph nodes are stable to minimally decreased in size compared to the prior study, potentially metastatic. No new lymphadenopathy is noted elsewhere in the chest, abdomen or pelvis. 3. Multiple small pulmonary nodules measuring 5 mm or less in  the lungs. These were incidentally imaged on the prior CT of the abdomen and pelvis 09/01/2021, and at this time are stable. The possibility of metastatic disease is not excluded, and close attention on follow-up studies is recommended. 4. Colonic diverticulosis without evidence of acute diverticulitis at this time. 5. Additional incidental findings, as above.     12/31/2021 Surgery   Robotic-assisted laparoscopic total hysterectomy with bilateral salpingo-oophorectomy, lysis of adhesions for approximately 40 minutes, cystoscopy   Findings: On EUA, moderately mobile enlarged uterus.  On intra-abdominal entry, filmy adhesions between the anterior liver and the diaphragm bilaterally.  No carcinomatosis noted on the diaphragm or liver.  Normal-appearing stomach, small bowel and omentum.  No peritoneal lesions in the lower abdominal cavity or pelvis.  No ascites.  Uterus enlarged measuring approximately 12-14 cm, bulbous at the top.  Posterior 2 cm fibroid near the fundus.  Posterior aspect of the uterus densely adherent to the colon mesentery.  Somewhat dilated fluid-filled left fallopian tube.  Normal-appearing right adnexa.  Left ovary adherent to the broad ligament and sigmoid mesentery but otherwise normal-appearing.  Significant fibrosis bilaterally of the retroperitoneum.  No obvious adenopathy appreciated. On cystoscopy, bladder dome intact, good efflux noted from bilateral ureteral orifices.   12/31/2021 Pathology Results   A. CULDESAC, POSTERIOR ADHESIONS, EXCISION: -  Serosa and subserosal fibrous soft tissue with focal acute inflammation, reactive mesothelial hyperplasia and adhesions. -  Negative for malignancy.  B. UTERUS, CERVIX, BILATERAL TUBE AND OVAARIES, HYSTERECTOMY: - -  Carcinosarcoma (malignant mixed Mullerian tumor) with full-thickness posterior myometrial involvement with focal serosal present and extension into lower uterine segment and extensively in the  cervical stroma. 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) versus focal partial limited involvement by a papillary component of the patient's known carcinosarcoma Peritoneal/Ascitic Fluid: N/A Lymphovascular Invasion: Not identified Regional Lymph Nodes: Not applicable; no lymph nodes submitted      PHYSICAL EXAMINATION: ECOG PERFORMANCE STATUS: 2 - Symptomatic, <50% confined to bed  Vitals:   01/29/22 0951  BP: (!) 140/83  Pulse: 83  Resp: 18  Temp: (!) 97.4 F (36.3 C)  SpO2: 99%   Filed Weights   01/29/22 0951  Weight: 207 lb (93.9 kg)    GENERAL:alert, no distress and comfortable SKIN: skin color, texture, turgor are normal, no rashes or significant lesions EYES: normal, Conjunctiva are pink and non-injected, sclera clear OROPHARYNX:no exudate, no erythema and lips, buccal mucosa, and tongue normal  NECK: supple, thyroid normal size, non-tender, without nodularity LYMPH:  no palpable lymphadenopathy in the cervical, axillary or inguinal LUNGS: clear to auscultation and percussion with normal breathing effort HEART: regular rate & rhythm and no murmurs and no lower extremity edema ABDOMEN:abdomen soft,  non-tender and normal bowel sounds.  Noted well-healed surgical scar Musculoskeletal:no cyanosis of digits and no clubbing  NEURO: alert & oriented x 3 with fluent speech, no focal motor/sensory deficits  LABORATORY DATA:  I have reviewed the data as listed    Component Value Date/Time   NA 140 01/29/2022 0930   K 3.7 01/29/2022 0930   CL 104 01/29/2022 0930   CO2 27 01/29/2022 0930   GLUCOSE 150 (H) 01/29/2022 0930   BUN 12 01/29/2022 0930   CREATININE 0.66 01/29/2022 0930   CALCIUM 10.0 01/29/2022 0930   PROT 7.5 01/29/2022 0930   ALBUMIN 4.1 01/29/2022 0930   AST 20 01/29/2022 0930   ALT 9 01/29/2022 0930   ALKPHOS 48 01/29/2022 0930   BILITOT 0.5 01/29/2022 0930   GFRNONAA >60 01/29/2022 0930    No results found for: "SPEP", "UPEP"  Lab Results  Component Value Date   WBC 4.1 01/29/2022   NEUTROABS 3.3 01/29/2022   HGB 11.2 (L) 01/29/2022   HCT 34.9 (L) 01/29/2022   MCV 88.8 01/29/2022   PLT 261 01/29/2022      Chemistry      Component Value Date/Time   NA 140 01/29/2022 0930   K 3.7 01/29/2022 0930   CL 104 01/29/2022 0930   CO2 27 01/29/2022 0930   BUN 12 01/29/2022 0930   CREATININE 0.66 01/29/2022 0930      Component Value Date/Time   CALCIUM 10.0 01/29/2022 0930   ALKPHOS 48 01/29/2022 0930   AST 20 01/29/2022 0930   ALT 9 01/29/2022 0930   BILITOT 0.5 01/29/2022 0930       RADIOGRAPHIC STUDIES: I have personally reviewed the radiological images as listed and agreed with the findings in the report. VAS [place] LOWER EXTREMITY VENOUS (DVT)  Result Date: 01/15/2022  Lower Venous DVT Study Patient Name:  Sarah Wu  Date of Exam:   01/15/2022 Medical Rec #: [id number]           Accession #:    [id number] Date of Birth: 01-31-1951           Patient Gender: F Patient Age:   8 years Exam Location:  [facility] Procedure:  VAS [place] LOWER EXTREMITY VENOUS (DVT) Referring Phys: Jeral Pinch  --------------------------------------------------------------------------------  Indications: Carcinosarcoma.  Comparison Study: no prior Performing Technologist: Archie Patten RVS  Examination Guidelines: A complete evaluation includes B-mode imaging, spectral Doppler, color Doppler, and power Doppler as needed of all accessible portions of each vessel. Bilateral testing is considered an integral part of a complete examination. Limited examinations for reoccurring indications may be performed as noted. The reflux portion of the exam is performed with the patient in reverse Trendelenburg.  +---------+---------------+---------+-----------+----------+-------------------+ RIGHT    CompressibilityPhasicitySpontaneityPropertiesThrombus Aging      +---------+---------------+---------+-----------+----------+-------------------+ CFV      Full           Yes      Yes                                      +---------+---------------+---------+-----------+----------+-------------------+ SFJ      Full                                                             +---------+---------------+---------+-----------+----------+-------------------+ FV Prox  Full                                                             +---------+---------------+---------+-----------+----------+-------------------+ FV Mid   Full                                                             +---------+---------------+---------+-----------+----------+-------------------+ FV DistalFull                                                             +---------+---------------+---------+-----------+----------+-------------------+ PFV      Full                                                             +---------+---------------+---------+-----------+----------+-------------------+ POP      Full           Yes      Yes                                       +---------+---------------+---------+-----------+----------+-------------------+ PTV      Full           Yes      Yes                                      +---------+---------------+---------+-----------+----------+-------------------+  PERO                                                  Not well visualized +---------+---------------+---------+-----------+----------+-------------------+   +---------+---------------+---------+-----------+----------+-------------------+ LEFT     CompressibilityPhasicitySpontaneityPropertiesThrombus Aging      +---------+---------------+---------+-----------+----------+-------------------+ CFV      Full           Yes      Yes                                      +---------+---------------+---------+-----------+----------+-------------------+ SFJ      Full                                                             +---------+---------------+---------+-----------+----------+-------------------+ FV Prox  Full                                                             +---------+---------------+---------+-----------+----------+-------------------+ FV Mid   Full                                                             +---------+---------------+---------+-----------+----------+-------------------+ FV DistalFull                                                             +---------+---------------+---------+-----------+----------+-------------------+ PFV      Full                                                             +---------+---------------+---------+-----------+----------+-------------------+ POP      Full           Yes      Yes                                      +---------+---------------+---------+-----------+----------+-------------------+ PTV      Full                                                             +---------+---------------+---------+-----------+----------+-------------------+  PERO  Not well visualized +---------+---------------+---------+-----------+----------+-------------------+     Summary: BILATERAL: - No evidence of deep vein thrombosis seen in the lower extremities, bilaterally. -No evidence of popliteal cyst, bilaterally.   *See table(s) above for measurements and observations. Electronically signed by Jamelle Haring on 01/15/2022 at 5:38:06 PM.    Final    CT Angio Chest Pulmonary Embolism (PE) W or WO Contrast  Result Date: 01/15/2022 CLINICAL DATA:  Uterine/cervical cancer. Pulmonary nodules; questionable pulmonary embolus on prior non angiogram chest CT scan of 11/21/2021. * Tracking Code: BO * EXAM: CT ANGIOGRAPHY CHEST WITH CONTRAST TECHNIQUE: Multidetector CT imaging of the chest was performed using the standard protocol during bolus administration of intravenous contrast. Multiplanar CT image reconstructions and MIPs were obtained to evaluate the vascular anatomy. RADIATION DOSE REDUCTION: This exam was performed according to the departmental dose-optimization program which includes automated exposure control, adjustment of the mA and/or kV according to patient size and/or use of iterative reconstruction technique. CONTRAST:  80m OMNIPAQUE IOHEXOL 350 MG/ML SOLN COMPARISON:  11/21/2021 FINDINGS: Cardiovascular: No filling defect is identified in the pulmonary arterial tree to suggest pulmonary embolus. Right Port-A-Cath tip: SVC. Mediastinum/Nodes: Unremarkable Lungs/Pleura: Small calcified granuloma in the posterior basal segment right lower lobe image 81 series 7. There are few scattered nodules in the 3-4 mm range, 1 of the larger nodules is a 3 by 4 mm left apical nodule on image 27 series 7 which is not changed from 11/21/2021. Likewise the other small nodules are not appreciably changed. The nodules along the lung bases are not changed from the earlier cross-sectional imaging of 09/01/2021. Upper Abdomen:  Subcutaneous gas along the left abdomen at the level of the kidneys, without thick enhancing margins, image 98 series 5. This is only partially included on today's exam. Reportedly the patient had laparoscopic hysterectomy 15 days ago, although this amount of gas is more than I would expect over 2 weeks out from surgery. Correlate with any regional symptoms. We only partially included this process as it is an abdominal process and today's CT scan was a chest CT. Small incidental lipoma or fatty polyp along the gastric antrum, image 79 series 5. Musculoskeletal: At the bottom most margin of imaging we demonstrate a densely sclerotic 1.7 by 1.1 by 1.5 cm lesion in the L1 vertebral body abutting the inferior endplate. This lesion was not visible or present on 09/01/2021 and I do not see an obvious underlying Schmorl's node. Accordingly this lesion is suspicious for oligometastatic disease to the skeleton, with sclerosis resulting from the neoadjuvant chemotherapy that the patient received. Thoracic spondylosis. Review of the MIP images confirms the above findings. IMPRESSION: 1. No filling defect is identified in the pulmonary arterial tree to suggest pulmonary embolus. 2. Subcutaneous gas along the left abdomen at the level of the kidneys, only partially included on today's CT scan of the chest. This amount of gas is more than I would expect over 2 weeks out from surgery. Correlate with any regional symptoms in determining whether further workup is indicated. 3. 1.7 by 1.1 by 1.5 cm densely sclerotic lesion in the L1 vertebral body abutting the inferior endplate. This lesion was not visible or present on 09/01/2021 and I do not see an obvious underlying Schmorl's node or other benign explanation as to why this sclerotic lesion would have formed. Accordingly this lesion is suspicious for oligometastatic disease to the skeleton, with sclerosis resulting from the neoadjuvant chemotherapy that the patient received. The  sclerosis may well represent an indicator of  effective therapy. 4. Scattered small (2-4 mm diameter) pulmonary nodules are stable from 11/21/2021. These are likely benign but merits surveillance given the clinical context. 5. Small incidental lipoma or fatty polyp along the gastric antrum. Likely clinically inconsequential. Electronically Signed   By: Van Clines M.D.   On: 01/15/2022 14:24

## 2022-01-30 ENCOUNTER — Other Ambulatory Visit: Payer: Self-pay

## 2022-01-30 ENCOUNTER — Encounter: Payer: Self-pay | Admitting: Gynecologic Oncology

## 2022-01-30 ENCOUNTER — Inpatient Hospital Stay (HOSPITAL_BASED_OUTPATIENT_CLINIC_OR_DEPARTMENT_OTHER): Payer: Medicare Other | Admitting: Gynecologic Oncology

## 2022-01-30 VITALS — BP 134/65 | HR 65 | Temp 98.1°F | Resp 14 | Wt 208.0 lb

## 2022-01-30 DIAGNOSIS — C801 Malignant (primary) neoplasm, unspecified: Secondary | ICD-10-CM

## 2022-01-30 DIAGNOSIS — Z90710 Acquired absence of both cervix and uterus: Secondary | ICD-10-CM

## 2022-01-30 DIAGNOSIS — Z90722 Acquired absence of ovaries, bilateral: Secondary | ICD-10-CM

## 2022-01-30 DIAGNOSIS — C539 Malignant neoplasm of cervix uteri, unspecified: Secondary | ICD-10-CM

## 2022-01-30 DIAGNOSIS — Z7189 Other specified counseling: Secondary | ICD-10-CM

## 2022-01-30 NOTE — Progress Notes (Signed)
Gynecologic Oncology Return Clinic Visit  01/30/22  Reason for Visit: follow-up after surgery  Treatment History: Oncology History Overview Note  Outside path showed carcinosarcoma, PD-L1 positive, MSI stable   Uterine cancer ([name])  07/30/2021 Initial Diagnosis   She was evaluated for post-menopausal bleeding. She reported having been seen a year previously and having a biopsy that was negative.  On exam in clinic, she was noted to have a mass present within the vagina.  This was thought to be a prolapsing fibroid and plan was made for exam under anesthesia, removal prolapsing fibroid with hysteroscopy and D&C   08/11/2021 Pathology Results   PD-L1 [phone]):                                             Does meet FDA-approval, Combined Positive Score (CPS): >=1   Pan-TRK IHC:                                              Tumor cells are Focally Positive for TRK protein expression.   MICROSATELLITE INSTABILITY:             MS-Stable   TUMOR MUTATION BURDEN:                10.8 Muts/Mb TMB-Low   OTHER BIOMARKERS:                              MET amplification PIK3CA amplification MDM2 amplification CCNE1 amplification   Pertinent Negative Biomarkers Evaluated by NGS: ALK ATM BARD1 BRAF BRCA1 BRCA2 CDK12 CHECK1 CHEK2 EGFR  ERBB2 ESR1 FANCL FGFR2 FGFR3 IDH1 IDH2 KIT KRAS NRAS NTRK1 NTRK2 NTRK3 PALB2 PDGFRA RAD51B RAD51C  RAD51D RAD54L RET ROS1   Comment:  The above studies are performed and reported by [facility], [place]) as part of the [facility] (NGS) panel.  Pertinent results are summarized above.  Please see their separate reports for more detailed information  Addendum electronically signed by Sarah Soho, MD on 09/03/2021 at  7:37 AM  Final Diagnosis    A.  CERVICAL MASS, EXCISION:       - CONSISTENT WITH CARCINOSARCOMA (HIGH GRADE MALIGNANT BIPHASIC TUMOR)  Electronically signed by Sarah Soho, MD on 08/15/2021 at  9:53 AM  Comment    The malignant epithelial component  exhibits high-grade cytomorphology and immunostaining pattern consistent with serous carcinoma to include strong positive diffuse reactivity for p53, p16, and vimentin, focal subset CK7 and CK AE1/AE3 expression, with high Ki-67 staining (approximately 60%).  Neuroendocrine differentiation is present (CD56 negative but synaptophysin positive).  The stromal component is composed of malignant mitotically active cells and heterologous elements with focal chondroid differentiation (S100 positive).  SMA appears to decorate rare tumor cells and highlights vascular structures.  The tumor cells are non-reactive for CK20, WT1, GATA3, p40 and desmin.  Block A5 is referred to [facility], [place]) to perform molecular characterization.  These findings are shared with Sarah Wu on [phone] at [phone] hours by telephone conversation. This case received prospective intradepartmental quality assurance review.   Clinical Information    Cervical mass  Gross Description    A. Cervix Received fresh labeled with the patient's name and date of  birth and "cervical mass frozen section" per container is an aggregate of red-tan friable soft tissue measuring approximately 5 x 5 x 3 cm in toto.  Representative sections are frozen for an intraoperative consultation.  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   Results rendered by Dr. Wyatt Wu and reported to Dr. Macarthur Wu at [facility] on 08/11/2021 at 8:06 AM.  Microscopic Description    Microscopic examination supports the above diagnosis.  Single antibody immunostains are performed to include: S100, p40, p16, p53, PAX8, CK7, CK20, vimentin, CK AE1/3, GATA3, WT1, desmin, SMA, synaptophysin, CD56, and Ki-67.  All controls are appropriately  reactive. [id number], [id number], [id number], [id number], [id number], [id number] (15)          08/22/2021 Initial Diagnosis   Carcinosarcoma ([place])   09/01/2021 Imaging   CT abdomen and pelvis There is marked enlargement of the uterus measuring 13.7 x 10 x 8 x 10.1 cm. There is inhomogeneous enhancement in the uterus consistent with malignant neoplasm.   There are enlarged lymph nodes in the retroperitoneum in the para-and paracaval regions measuring up to 12 mm in short axis. There are a few enlarged lymph nodes adjacent to the iliac vessels largest measuring 2.6 x 1.4 cm adjacent to the right external iliac vessels. Findings suggest possible metastatic lymphadenopathy. There is mild prominence of the pelvocaliceal system in the left kidney and proximal left ureter. Possibility of extrinsic compression or infiltrative process related to the uterine neoplasm in the distal course of left ureter causing mild obstruction is not excluded.   Gallbladder stone.  Diverticulosis of colon.   Other findings as described in the body of the report.   09/01/2021 Imaging   US pelvis Large central uterine ill-defined mass approximately 7.7 cm diameter enlarging uterus likely representing the biopsy-proven neoplasm, likely of endometrial origin.   Questionable intramural leiomyoma posterior mid uterus 3.1 cm diameter.   The mass extend and the adnexal regions are poorly characterized but may potentially be better evaluated by MR imaging with and without contrast.   09/10/2021 Surgery   Pre-operative Diagnosis: carcinosarcoma of the uterus, adenopathy on imaging   Post-operative Diagnosis: same, stage IVB carcinosarcoma of the uterus   Operation: Diagnostic laparoscopy    Surgeon: Sarah Pinch MD Operative Findings: On EUA, cervix 2 cm dilated with tumor noted within the os. Cervix otherwise normal appearing. Uterus enlarged, 12 cm, with limited mobility secondary to weight. Compression of the rectum from uterus although no  direct invasion appreciated. On intra-abdominal inspection, adhesions noted between the liver and the anterior abdominal wall. Evidence of carcinomatosis involving liver surface, diaphragm, omentum, anterior abdominal wall. Surface of the uterus with obvious tumor infiltration. Small bowel free from uterus. Posteriorly, sigmoid densely adherent to the posterior uterus. Even with manipulator, unable to move the uterus anteriorly. Minimal ascites.    Given findings of stage IV disease and what would require large bowel resection and end ostomy, decision made to abort surgery in favor of neoadjuvant chemotherapy. I had discussed this as a distinct possibility when we had gotten her CT results as well as this morning prior to the surgery.    09/15/2021 Cancer Staging   Staging form: Corpus Uteri - Carcinoma and Carcinosarcoma, AJCC 8th Edition - Clinical stage from 09/15/2021: FIGO Stage IVB (cT3, cN2a, pM1) - Signed  by Heath Lark, MD on 09/15/2021 Stage prefix: Initial diagnosis   09/24/2021 -  Chemotherapy   Patient is on Treatment Plan : UTERINE ENDOMETRIAL Dostarlimab-gxly (500 mg) + Carboplatin (AUC 5) + Paclitaxel (175 mg/m2) q21d x 6 cycles / Dostarlimab-gxly (1000 mg) q42d x 6 cycles      10/17/2021 Procedure   Status post revision of nonfunctional right IJ port catheter, with new right IJ port catheter placed. Port is ready for use.     11/24/2021 Imaging   1. Today's study demonstrates a positive response to therapy with partial involution of large malignant appearing uterine mass. 2. Multiple prominent borderline enlarged and mildly enlarged retroperitoneal and pelvic lymph nodes are stable to minimally decreased in size compared to the prior study, potentially metastatic. No new lymphadenopathy is noted elsewhere in the chest, abdomen or pelvis. 3. Multiple small pulmonary nodules measuring 5 mm or less in the lungs. These were incidentally imaged on the prior CT of the abdomen and pelvis  09/01/2021, and at this time are stable. The possibility of metastatic disease is not excluded, and close attention on follow-up studies is recommended. 4. Colonic diverticulosis without evidence of acute diverticulitis at this time. 5. Additional incidental findings, as above.     12/31/2021 Surgery   Robotic-assisted laparoscopic total hysterectomy with bilateral salpingo-oophorectomy, lysis of adhesions for approximately 40 minutes, cystoscopy   Findings: On EUA, moderately mobile enlarged uterus.  On intra-abdominal entry, filmy adhesions between the anterior liver and the diaphragm bilaterally.  No carcinomatosis noted on the diaphragm or liver.  Normal-appearing stomach, small bowel and omentum.  No peritoneal lesions in the lower abdominal cavity or pelvis.  No ascites.  Uterus enlarged measuring approximately 12-14 cm, bulbous at the top.  Posterior 2 cm fibroid near the fundus.  Posterior aspect of the uterus densely adherent to the colon mesentery.  Somewhat dilated fluid-filled left fallopian tube.  Normal-appearing right adnexa.  Left ovary adherent to the broad ligament and sigmoid mesentery but otherwise normal-appearing.  Significant fibrosis bilaterally of the retroperitoneum.  No obvious adenopathy appreciated. On cystoscopy, bladder dome intact, good efflux noted from bilateral ureteral orifices.   12/31/2021 Pathology Results   A. CULDESAC, POSTERIOR ADHESIONS, EXCISION: -  Serosa and subserosal fibrous soft tissue with focal acute inflammation, reactive mesothelial hyperplasia and adhesions. -  Negative for malignancy.  B. UTERUS, CERVIX, BILATERAL TUBE AND OVAARIES, HYSTERECTOMY: - -  Carcinosarcoma (malignant mixed Mullerian tumor) with full-thickness posterior myometrial involvement with focal serosal present and extension into lower uterine segment and extensively in the cervical stroma. 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  Nuys any abdominal or pelvic pain.  Reports regular bowel function with the use of bowel regimen.  Continues to have some urinary frequency, unchanged since after surgery.  Denies any vaginal bleeding or discharge.  Past Medical/Surgical History: Past Medical History:  Diagnosis Date   Arthritis    Cancer ([place])    Fibroid tumor    Hx of mammogram 2022   [place]   Hypercholesteremia    Hypertension    Post-menopausal bleeding    Pre-diabetes     Past Surgical History:  Procedure Laterality Date   EXAMINATION UNDER ANESTHESIA  08/11/2021   with cervical mass biopsies   IR CHEST FLUORO  10/14/2021   IR IMAGING GUIDED PORT INSERTION  09/23/2021   IR IMAGING GUIDED PORT INSERTION  10/17/2021   LAPAROSCOPY N/A 09/10/2021   Procedure: DIAGNOSTIC LAPAROSCOPY;  Surgeon: Lafonda Mosses, MD;  Location: [facility];  Service: Gynecology;  Laterality: N/A;   REPLACEMENT TOTAL KNEE BILATERAL     TUBAL  LIGATION      Family History  Problem Relation Age of Onset   Breast cancer Sister        half-sister   Colon cancer Neg Hx    Ovarian cancer Neg Hx    Endometrial cancer Neg Hx    Pancreatic cancer Neg Hx    Prostate cancer Neg Hx     Social History   Socioeconomic History   Marital status: Single    Spouse name: Not on file   Number of children: Not on file   Years of education: Not on file   Highest education level: Not on file  Occupational History   Not on file  Tobacco Use   Smoking status: Never    Passive exposure: Never   Smokeless tobacco: Never  Vaping Use   Vaping Use: Never used  Substance and Sexual Activity   Alcohol use: Never   Drug use: Never   Sexual activity: Not Currently  Other Topics Concern   Not on file  Social History Narrative   Not on file   Social Determinants of Health   Financial Resource Strain: Not on file  Food Insecurity: Food Insecurity Present (09/24/2021)   Hunger Vital Sign    Worried About Running Out of Food in the Last Year: Sometimes true    Ran Out of Food in the Last Year: Not on file  Transportation Needs: No Transportation Needs (09/24/2021)   PRAPARE - Hydrologist (Medical): No    Lack of Transportation (Non-Medical): No  Physical Activity: Not on file  Stress: Not on file  Social Connections: Not on file    Current Medications:  Current Outpatient Medications:    allopurinol (ZYLOPRIM) 300 MG tablet, Take 300 mg by mouth daily., Disp: , Rfl:    amLODipine (NORVASC) 5 MG tablet, Take 5 mg by mouth at bedtime., Disp: , Rfl:    atenolol (TENORMIN) 50 MG tablet, Take 50 mg by mouth daily., Disp: , Rfl:    atorvastatin (LIPITOR) 10 MG tablet, Take 10 mg by mouth at bedtime., Disp: , Rfl:    dexamethasone (DECADRON) 4 MG tablet, Take 2 tablets by mouth the night before and 2 tablets the morning of chemotherapy, every 3 weeks, for 6 cycles, Disp: 36 tablet, Rfl: 6   ferrous sulfate 325  (65 FE) MG tablet, Take 325 mg by mouth daily., Disp: , Rfl:    furosemide (LASIX) 20 MG tablet, Take 1 tablet (20  mg total) by mouth daily., Disp: 30 tablet, Rfl: 0   lidocaine-prilocaine (EMLA) cream, Apply to affected area once, Disp: 30 g, Rfl: 3   Multiple Vitamins-Minerals (MULTIVITAMIN WITH MINERALS) tablet, Take 1 tablet by mouth at bedtime. Centrum, Disp: , Rfl:    ondansetron (ZOFRAN) 8 MG tablet, Take 1 tablet by mouth every 8 hours as needed for nausea or vomiting. Start on the third day after chemotherapy., Disp: 30 tablet, Rfl: 1   polyethylene glycol (MIRALAX / GLYCOLAX) 17 g packet, Take 17 g by mouth daily., Disp: , Rfl:    prochlorperazine (COMPAZINE) 10 MG tablet, Take 1 tablet (10 mg total) by mouth every 6 (six) hours as needed for nausea or vomiting., Disp: 30 tablet, Rfl: 1   senna-docusate (SENOKOT-S) 8.6-50 MG tablet, Take 2 tablets by mouth 2 (two) times daily. For AFTER surgery, do not take if having loose stools, Disp: 30 tablet, Rfl: 0   traMADol (ULTRAM) 50 MG tablet, Take 1-2 tablets (50-100 mg total) by mouth every 6 (six) hours as needed for severe pain. For AFTER surgery, do not take and drive, Disp: 15 tablet, Rfl: 0   zinc gluconate 50 MG tablet, Take 50 mg by mouth daily., Disp: , Rfl:   Review of Systems: + Urinary frequency, joint pain, problem with walking Denies appetite changes, fevers, chills, fatigue, unexplained weight changes. Denies hearing loss, neck lumps or masses, mouth sores, ringing in ears or voice changes. Denies cough or wheezing.  Denies shortness of breath. Denies chest pain or palpitations. Denies leg swelling. Denies abdominal distention, pain, blood in stools, constipation, diarrhea, nausea, vomiting, or early satiety. Denies pain with intercourse, dysuria, hematuria or incontinence. Denies hot flashes, pelvic pain, vaginal bleeding or vaginal discharge.   Denies back pain or muscle pain/cramps. Denies itching, rash, or wounds. Denies  dizziness, headaches, numbness or seizures. Denies swollen lymph nodes or glands, denies easy bruising or bleeding. Denies anxiety, depression, confusion, or decreased concentration.  Physical Exam: BP 134/65 (BP Location: Left Arm, Patient Position: Sitting)   Pulse 65   Temp 98.1 F (36.7 C) (Oral)   Resp 14   Wt 208 lb (94.3 kg)   SpO2 100%   BMI 31.63 kg/m  General: Alert, oriented, no acute distress. HEENT: Normocephalic, atraumatic, sclera anicteric. Chest: Clear to auscultation bilaterally.  No wheezes or rhonchi. Cardiovascular: Regular rate and rhythm, no murmurs. Abdomen: soft, nontender.  Normoactive bowel sounds.  No masses or hepatosplenomegaly appreciated.  Well-healed incisions.  Extremities: Grossly normal range of motion.  Warm, well perfused.  1+ edema bilaterally. Skin: No rashes or lesions noted. GU: Normal appearing external genitalia without erythema, excoriation, or lesions.  Speculum exam reveals cuff intact, no discharge or bleeding.  Suture visible.  Bimanual exam reveals cuff intact, no fluctuance or tenderness to palpation.    Laboratory & Radiologic Studies: None new  Assessment & Plan: Sarah Wu is a 71 y.o. woman with Stage IVB carcinosarcoma s/p IDS on 11/22 after 4 cycles of NACT who presents for phone follow-up.   Patient continues to do well from a postoperative standpoint.  Bowels are functioning normally.  Reviewed continued expectations and restrictions.  I reviewed pathology again from surgery with the patient and her daughter.  She was given a copy of her pathology report.  Given her high risk histology as well as cervical involvement, we had discussed consideration of at least vaginal brachytherapy if not pelvic radiation.  The patient and her daughter met with Dr. Sondra Come plan to  proceed with vaginal brachytherapy.  The patient will resume adjuvant chemotherapy in early January.  18 minutes of total time was spent for this patient  encounter, including preparation, face-to-face counseling with the patient and coordination of care, and documentation of the encounter.  Sarah Pinch, MD  Division of Gynecologic Oncology  Department of Obstetrics and Gynecology  [facility]

## 2022-01-30 NOTE — Patient Instructions (Signed)
You are healing well from surgery today.  Please remember, no heavy lifting for at least 6 weeks after surgery and nothing in the vagina for 8-10.  I will see you for follow-up after you finish treatment.

## 2022-02-18 MED FILL — Fosaprepitant Dimeglumine For IV Infusion 150 MG (Base Eq): INTRAVENOUS | Qty: 5 | Status: AC

## 2022-02-18 MED FILL — Dexamethasone Sodium Phosphate Inj 100 MG/10ML: INTRAMUSCULAR | Qty: 1 | Status: AC

## 2022-02-19 ENCOUNTER — Other Ambulatory Visit: Payer: Self-pay

## 2022-02-19 ENCOUNTER — Encounter: Payer: Self-pay | Admitting: Hematology and Oncology

## 2022-02-19 ENCOUNTER — Inpatient Hospital Stay (HOSPITAL_BASED_OUTPATIENT_CLINIC_OR_DEPARTMENT_OTHER): Payer: Medicare Other | Admitting: Hematology and Oncology

## 2022-02-19 ENCOUNTER — Inpatient Hospital Stay: Payer: Medicare Other | Attending: Gynecologic Oncology

## 2022-02-19 ENCOUNTER — Inpatient Hospital Stay: Payer: Medicare Other

## 2022-02-19 VITALS — BP 129/85 | HR 73 | Temp 97.4°F | Resp 18 | Ht 68.0 in | Wt 199.3 lb

## 2022-02-19 DIAGNOSIS — K5909 Other constipation: Secondary | ICD-10-CM | POA: Insufficient documentation

## 2022-02-19 DIAGNOSIS — C549 Malignant neoplasm of corpus uteri, unspecified: Secondary | ICD-10-CM

## 2022-02-19 DIAGNOSIS — Z5111 Encounter for antineoplastic chemotherapy: Secondary | ICD-10-CM | POA: Insufficient documentation

## 2022-02-19 DIAGNOSIS — D638 Anemia in other chronic diseases classified elsewhere: Secondary | ICD-10-CM | POA: Insufficient documentation

## 2022-02-19 DIAGNOSIS — R634 Abnormal weight loss: Secondary | ICD-10-CM | POA: Diagnosis not present

## 2022-02-19 DIAGNOSIS — Z79899 Other long term (current) drug therapy: Secondary | ICD-10-CM | POA: Insufficient documentation

## 2022-02-19 DIAGNOSIS — C55 Malignant neoplasm of uterus, part unspecified: Secondary | ICD-10-CM | POA: Insufficient documentation

## 2022-02-19 DIAGNOSIS — C801 Malignant (primary) neoplasm, unspecified: Secondary | ICD-10-CM

## 2022-02-19 LAB — CBC WITH DIFFERENTIAL (CANCER CENTER ONLY)
Abs Immature Granulocytes: 0.03 10*3/uL (ref 0.00–0.07)
Basophils Absolute: 0 10*3/uL (ref 0.0–0.1)
Basophils Relative: 0 %
Eosinophils Absolute: 0 10*3/uL (ref 0.0–0.5)
Eosinophils Relative: 0 %
HCT: 32.3 % — ABNORMAL LOW (ref 36.0–46.0)
Hemoglobin: 10.6 g/dL — ABNORMAL LOW (ref 12.0–15.0)
Immature Granulocytes: 0 %
Lymphocytes Relative: 11 %
Lymphs Abs: 0.8 10*3/uL (ref 0.7–4.0)
MCH: 28.7 pg (ref 26.0–34.0)
MCHC: 32.8 g/dL (ref 30.0–36.0)
MCV: 87.5 fL (ref 80.0–100.0)
Monocytes Absolute: 0.2 10*3/uL (ref 0.1–1.0)
Monocytes Relative: 3 %
Neutro Abs: 6.1 10*3/uL (ref 1.7–7.7)
Neutrophils Relative %: 86 %
Platelet Count: 250 10*3/uL (ref 150–400)
RBC: 3.69 MIL/uL — ABNORMAL LOW (ref 3.87–5.11)
RDW: 16.1 % — ABNORMAL HIGH (ref 11.5–15.5)
WBC Count: 7.1 10*3/uL (ref 4.0–10.5)
nRBC: 0 % (ref 0.0–0.2)

## 2022-02-19 LAB — CMP (CANCER CENTER ONLY)
ALT: 8 U/L (ref 0–44)
AST: 15 U/L (ref 15–41)
Albumin: 3.9 g/dL (ref 3.5–5.0)
Alkaline Phosphatase: 47 U/L (ref 38–126)
Anion gap: 10 (ref 5–15)
BUN: 10 mg/dL (ref 8–23)
CO2: 28 mmol/L (ref 22–32)
Calcium: 9.5 mg/dL (ref 8.9–10.3)
Chloride: 103 mmol/L (ref 98–111)
Creatinine: 0.53 mg/dL (ref 0.44–1.00)
GFR, Estimated: >60 mL/min (ref >60)
Glucose, Bld: 110 mg/dL — ABNORMAL HIGH (ref 70–99)
Potassium: 3.7 mmol/L (ref 3.5–5.1)
Sodium: 141 mmol/L (ref 135–145)
Total Bilirubin: 0.5 mg/dL (ref 0.3–1.2)
Total Protein: 7.1 g/dL (ref 6.5–8.1)

## 2022-02-19 LAB — SAMPLE TO BLOOD BANK

## 2022-02-19 LAB — TSH: TSH: 0.532 u[IU]/mL (ref 0.350–4.500)

## 2022-02-19 MED ORDER — SODIUM CHLORIDE 0.9 % IV SOLN
500.0000 mg | Freq: Once | INTRAVENOUS | Status: AC
  Administered 2022-02-19: 500 mg via INTRAVENOUS
  Filled 2022-02-19: qty 10

## 2022-02-19 MED ORDER — SODIUM CHLORIDE 0.9 % IV SOLN
150.0000 mg | Freq: Once | INTRAVENOUS | Status: AC
  Administered 2022-02-19: 150 mg via INTRAVENOUS
  Filled 2022-02-19: qty 150

## 2022-02-19 MED ORDER — FAMOTIDINE IN NACL 20-0.9 MG/50ML-% IV SOLN
20.0000 mg | Freq: Once | INTRAVENOUS | Status: AC
  Administered 2022-02-19: 20 mg via INTRAVENOUS
  Filled 2022-02-19: qty 50

## 2022-02-19 MED ORDER — SODIUM CHLORIDE 0.9% FLUSH
10.0000 mL | Freq: Once | INTRAVENOUS | Status: AC
  Administered 2022-02-19: 10 mL

## 2022-02-19 MED ORDER — SODIUM CHLORIDE 0.9 % IV SOLN
140.0000 mg/m2 | Freq: Once | INTRAVENOUS | Status: AC
  Administered 2022-02-19: 294 mg via INTRAVENOUS
  Filled 2022-02-19: qty 49

## 2022-02-19 MED ORDER — SODIUM CHLORIDE 0.9% FLUSH
10.0000 mL | INTRAVENOUS | Status: DC | PRN
  Administered 2022-02-19: 10 mL

## 2022-02-19 MED ORDER — SODIUM CHLORIDE 0.9 % IV SOLN
488.0000 mg | Freq: Once | INTRAVENOUS | Status: AC
  Administered 2022-02-19: 500 mg via INTRAVENOUS
  Filled 2022-02-19: qty 50

## 2022-02-19 MED ORDER — PALONOSETRON HCL INJECTION 0.25 MG/5ML
0.2500 mg | Freq: Once | INTRAVENOUS | Status: AC
  Administered 2022-02-19: 0.25 mg via INTRAVENOUS
  Filled 2022-02-19: qty 5

## 2022-02-19 MED ORDER — SODIUM CHLORIDE 0.9 % IV SOLN
10.0000 mg | Freq: Once | INTRAVENOUS | Status: AC
  Administered 2022-02-19: 10 mg via INTRAVENOUS
  Filled 2022-02-19: qty 10

## 2022-02-19 MED ORDER — SODIUM CHLORIDE 0.9 % IV SOLN: Freq: Once | INTRAVENOUS | Status: AC

## 2022-02-19 MED ORDER — HEPARIN SOD (PORK) LOCK FLUSH 100 UNIT/ML IV SOLN
500.0000 [IU] | Freq: Once | INTRAVENOUS | Status: AC | PRN
  Administered 2022-02-19: 500 [IU]

## 2022-02-19 MED ORDER — CETIRIZINE HCL 10 MG/ML IV SOLN
10.0000 mg | Freq: Once | INTRAVENOUS | Status: AC
  Administered 2022-02-19: 10 mg via INTRAVENOUS
  Filled 2022-02-19: qty 1

## 2022-02-19 NOTE — Assessment & Plan Note (Signed)
Her recent treatment course was complicated by constipation She has lost some weight I will adjust the dose of her treatment I recommend aggressive laxative therapy

## 2022-02-19 NOTE — Assessment & Plan Note (Signed)
Recommend aggressive laxatives

## 2022-02-19 NOTE — Progress Notes (Signed)
[facility] OFFICE PROGRESS NOTE  Patient Care Team: [name], [name], DO as PCP - General (Family Medicine)  ASSESSMENT & PLAN:  Uterine cancer [facility]) Her recent treatment course was complicated by constipation She has lost some weight I will adjust the dose of her treatment I recommend aggressive laxative therapy  Anemia, chronic disease This is likely due to recent treatment. The patient denies recent history of bleeding such as epistaxis, hematuria or hematochezia. She is asymptomatic from the anemia. I will observe for now.    Other constipation Recommend aggressive laxatives  No orders of the defined types were placed in this encounter.   All questions were answered. The patient knows to call the clinic with any problems, questions or concerns. The total time spent in the appointment was 20 minutes encounter with patients including review of chart and various tests results, discussions about plan of care and coordination of care plan   Heath Lark, MD 02/19/2022 9:56 AM  INTERVAL HISTORY: Please see below for problem oriented charting. she returns for treatment follow-up with family She had recent constipation Her appetite is stable although she has lost some weight Denies recent bleeding  REVIEW OF SYSTEMS:   Constitutional: Denies fevers, chills  Eyes: Denies blurriness of vision Ears, nose, mouth, throat, and face: Denies mucositis or sore throat Respiratory: Denies cough, dyspnea or wheezes Cardiovascular: Denies palpitation, chest discomfort or lower extremity swelling Skin: Denies abnormal skin rashes Lymphatics: Denies new lymphadenopathy or easy bruising Neurological:Denies numbness, tingling or new weaknesses Behavioral/Psych: Mood is stable, no new changes  All other systems were reviewed with the patient and are negative.  I have reviewed the past medical history, past surgical history, social history and family history with the patient and  they are unchanged from previous note.  ALLERGIES:  is allergic to hydrocodone-acetaminophen and oxycodone-acetaminophen.  MEDICATIONS:  Current Outpatient Medications  Medication Sig Dispense Refill   allopurinol (ZYLOPRIM) 300 MG tablet Take 300 mg by mouth daily.     amLODipine (NORVASC) 5 MG tablet Take 5 mg by mouth at bedtime.     atenolol (TENORMIN) 50 MG tablet Take 50 mg by mouth daily.     atorvastatin (LIPITOR) 10 MG tablet Take 10 mg by mouth at bedtime.     dexamethasone (DECADRON) 4 MG tablet Take 2 tablets by mouth the night before and 2 tablets the morning of chemotherapy, every 3 weeks, for 6 cycles 36 tablet 6   ferrous sulfate 325 (65 FE) MG tablet Take 325 mg by mouth daily.     furosemide (LASIX) 20 MG tablet Take 1 tablet (20 mg total) by mouth daily. 30 tablet 0   lidocaine-prilocaine (EMLA) cream Apply to affected area once 30 g 3   Multiple Vitamins-Minerals (MULTIVITAMIN WITH MINERALS) tablet Take 1 tablet by mouth at bedtime. Centrum     ondansetron (ZOFRAN) 8 MG tablet Take 1 tablet by mouth every 8 hours as needed for nausea or vomiting. Start on the third day after chemotherapy. 30 tablet 1   polyethylene glycol (MIRALAX / GLYCOLAX) 17 g packet Take 17 g by mouth daily.     prochlorperazine (COMPAZINE) 10 MG tablet Take 1 tablet (10 mg total) by mouth every 6 (six) hours as needed for nausea or vomiting. 30 tablet 1   senna-docusate (SENOKOT-S) 8.6-50 MG tablet Take 2 tablets by mouth 2 (two) times daily. For AFTER surgery, do not take if having loose stools 30 tablet 0   No current facility-administered medications  for this visit.   Facility-Administered Medications Ordered in Other Visits  Medication Dose Route Frequency Provider Last Rate Last Admin   CARBOplatin (PARAPLATIN) 500 mg in sodium chloride 0.9 % 250 mL chemo infusion  500 mg Intravenous Once Alvy Bimler, Aarin Bluett, MD       dexamethasone (DECADRON) 10 mg in sodium chloride 0.9 % 50 mL IVPB  10 mg  Intravenous Once Alvy Bimler, Roshini Fulwider, MD       dostarlimab-gxly (JEMPERLI) 500 mg in sodium chloride 0.9 % 100 mL (4.5455 mg/mL) chemo infusion  500 mg Intravenous Once Alvy Bimler, Recia Sons, MD       famotidine (PEPCID) IVPB 20 mg premix  20 mg Intravenous Once Alvy Bimler, Rana Hochstein, MD 200 mL/hr at 02/19/22 0952 20 mg at 02/19/22 9381   fosaprepitant (EMEND) 150 mg in sodium chloride 0.9 % 145 mL IVPB  150 mg Intravenous Once Alvy Bimler, Mackinsey Pelland, MD       heparin lock flush 100 unit/mL  500 Units Intracatheter Once PRN Alvy Bimler, Jolynne Spurgin, MD       PACLitaxel (TAXOL) 294 mg in sodium chloride 0.9 % 250 mL chemo infusion (> '80mg'$ /m2)  140 mg/m2 (Treatment Plan Recorded) Intravenous Once Madalin Hughart, MD       sodium chloride flush (NS) 0.9 % injection 10 mL  10 mL Intracatheter PRN Heath Lark, MD        SUMMARY OF ONCOLOGIC HISTORY: Oncology History Overview Note  Outside path showed carcinosarcoma, PD-L1 positive, MSI stable   Uterine cancer ([place])  07/30/2021 Initial Diagnosis   She was evaluated for post-menopausal bleeding. She reported having been seen a year previously and having a biopsy that was negative.  On exam in clinic, she was noted to have a mass present within the vagina.  This was thought to be a prolapsing fibroid and plan was made for exam under anesthesia, removal prolapsing fibroid with hysteroscopy and D&C   08/11/2021 Pathology Results   PD-L1 [phone]):                                             Does meet FDA-approval, Combined Positive Score (CPS): >=1   Pan-TRK IHC:                                              Tumor cells are Focally Positive for TRK protein expression.   MICROSATELLITE INSTABILITY:             MS-Stable   TUMOR MUTATION BURDEN:                10.8 Muts/Mb TMB-Low   OTHER BIOMARKERS:                              MET amplification PIK3CA amplification MDM2 amplification CCNE1 amplification   Pertinent Negative Biomarkers Evaluated by NGS: ALK ATM BARD1 BRAF BRCA1 BRCA2 CDK12 CHECK1 CHEK2 EGFR   ERBB2 ESR1 FANCL FGFR2 FGFR3 IDH1 IDH2 KIT KRAS NRAS NTRK1 NTRK2 NTRK3 PALB2 PDGFRA RAD51B RAD51C  RAD51D RAD54L RET ROS1   Comment:  The above studies are performed and reported by [facility] [place]) as part of the [employer] (NGS) panel.  Pertinent results are summarized above.  Please see their separate reports for  more detailed information  Addendum electronically signed by Elsworth Soho, MD on 09/03/2021 at  7:37 AM  Final Diagnosis    A.  CERVICAL MASS, EXCISION:       - CONSISTENT WITH CARCINOSARCOMA (HIGH GRADE MALIGNANT BIPHASIC TUMOR)  Electronically signed by Elsworth Soho, MD on 08/15/2021 at  9:53 AM  Comment    The malignant epithelial component exhibits high-grade cytomorphology and immunostaining pattern consistent with serous carcinoma to include strong positive diffuse reactivity for p53, p16, and vimentin, focal subset CK7 and CK AE1/AE3 expression, with high Ki-67 staining (approximately 60%).  Neuroendocrine differentiation is present (CD56 negative but synaptophysin positive).  The stromal component is composed of malignant mitotically active cells and heterologous elements with focal chondroid differentiation (S100 positive).  SMA appears to decorate rare tumor cells and highlights vascular structures.  The tumor cells are non-reactive for CK20, WT1, GATA3, p40 and desmin.  Block A5 is referred to [facility], [place]) to perform molecular characterization.  These findings are shared with Dr. Luanne Bras on [phone] at [phone] hours by telephone conversation. This case received prospective intradepartmental quality assurance review.   Clinical Information    Cervical mass  Gross Description    A. Cervix Received fresh labeled with the patient's name and date of birth and "cervical mass frozen section" per container is an aggregate of red-tan friable soft tissue measuring approximately 5 x 5 x 3 cm in toto.  Representative sections are frozen for an  intraoperative consultation.  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   Results rendered by Dr. Wyatt Portela and reported to Dr. Macarthur Critchley at [facility] on 08/11/2021 at 8:06 AM.  Microscopic Description    Microscopic examination supports the above diagnosis.  Single antibody immunostains are performed to include: S100, p40, p16, p53, PAX8, CK7, CK20, vimentin, CK AE1/3, GATA3, WT1, desmin, SMA, synaptophysin, CD56, and Ki-67.  All controls are appropriately reactive. [id number], [id number], [id number], [id number], [id number], [id number] (15)          08/22/2021 Initial Diagnosis   Carcinosarcoma ([place])   09/01/2021 Imaging   CT abdomen and pelvis There is marked enlargement of the uterus measuring 13.7 x 10 x 8 x 10.1 cm. There is inhomogeneous enhancement in the uterus consistent with malignant neoplasm.   There are enlarged lymph nodes in the retroperitoneum in the para-and paracaval regions measuring up to 12 mm in short axis. There are a few enlarged lymph nodes adjacent to the iliac vessels largest measuring 2.6 x 1.4 cm adjacent to the right external iliac vessels. Findings suggest possible metastatic lymphadenopathy. There is mild prominence of the pelvocaliceal system in the left kidney and proximal left ureter. Possibility of extrinsic compression or infiltrative process related to the uterine neoplasm in the distal course of left ureter causing mild obstruction is not excluded.   Gallbladder stone.  Diverticulosis of colon.   Other findings as described in the body of the report.   09/01/2021 Imaging   US pelvis Large central uterine ill-defined mass approximately 7.7 cm diameter enlarging uterus likely representing the biopsy-proven neoplasm, likely of endometrial origin.    Questionable intramural leiomyoma posterior mid uterus 3.1 cm diameter.   The mass extend and the adnexal regions are poorly characterized but may potentially be better evaluated by MR imaging with  and without contrast.   09/10/2021 Surgery   Pre-operative Diagnosis: carcinosarcoma of the uterus, adenopathy on imaging   Post-operative Diagnosis: same, stage IVB carcinosarcoma of the uterus   Operation: Diagnostic laparoscopy    Surgeon: Jeral Pinch MD Operative Findings: On EUA, cervix 2 cm dilated with tumor noted within the os. Cervix otherwise normal appearing. Uterus enlarged, 12 cm, with limited mobility secondary to weight. Compression of the rectum from uterus although no direct invasion appreciated. On intra-abdominal inspection, adhesions noted between the liver and the anterior abdominal wall. Evidence of carcinomatosis involving liver surface, diaphragm, omentum, anterior abdominal wall. Surface of the uterus with obvious tumor infiltration. Small bowel free from uterus. Posteriorly, sigmoid densely adherent to the posterior uterus. Even with manipulator, unable to move the uterus anteriorly. Minimal ascites.    Given findings of stage IV disease and what would require large bowel resection and end ostomy, decision made to abort surgery in favor of neoadjuvant chemotherapy. I had discussed this as a distinct possibility when we had gotten her CT results as well as this morning prior to the surgery.    09/15/2021 Cancer Staging   Staging form: Corpus Uteri - Carcinoma and Carcinosarcoma, AJCC 8th Edition - Clinical stage from 09/15/2021: FIGO Stage IVB (cT3, cN2a, pM1) - Signed by Heath Lark, MD on 09/15/2021 Stage prefix: Initial diagnosis   09/24/2021 -  Chemotherapy   Patient is on Treatment Plan : UTERINE ENDOMETRIAL Dostarlimab-gxly (500 mg) + Carboplatin (AUC 5) + Paclitaxel (175 mg/m2) q21d x 6 cycles / Dostarlimab-gxly (1000 mg) q42d x 6 cycles      10/17/2021 Procedure    Status post revision of nonfunctional right IJ port catheter, with new right IJ port catheter placed. Port is ready for use.     11/24/2021 Imaging   1. Today's study demonstrates a positive response to therapy with partial involution of large malignant appearing uterine mass. 2. Multiple prominent borderline enlarged and mildly enlarged retroperitoneal and pelvic lymph nodes are stable to minimally decreased in size compared to the prior study, potentially metastatic. No new lymphadenopathy is noted elsewhere in the chest, abdomen or pelvis. 3. Multiple small pulmonary nodules measuring 5 mm or less in the lungs. These were incidentally imaged on the prior CT of the abdomen and pelvis 09/01/2021, and at this time are stable. The possibility of metastatic disease is not excluded, and close attention on follow-up studies is recommended. 4. Colonic diverticulosis without evidence of acute diverticulitis at this time. 5. Additional incidental findings, as above.     12/31/2021 Surgery   Robotic-assisted laparoscopic total hysterectomy with bilateral salpingo-oophorectomy, lysis of adhesions for approximately 40 minutes, cystoscopy   Findings: On EUA, moderately mobile enlarged uterus.  On intra-abdominal entry, filmy adhesions between the anterior liver and the diaphragm bilaterally.  No carcinomatosis noted on the diaphragm or liver.  Normal-appearing stomach, small bowel and omentum.  No peritoneal lesions in the lower abdominal cavity or pelvis.  No ascites.  Uterus enlarged measuring approximately 12-14 cm, bulbous at the top.  Posterior 2 cm fibroid near the fundus.  Posterior aspect of the uterus densely adherent to the colon mesentery.  Somewhat dilated fluid-filled left fallopian tube.  Normal-appearing right adnexa.  Left ovary adherent to the broad ligament and sigmoid mesentery but otherwise normal-appearing.  Significant fibrosis bilaterally of the retroperitoneum.  No obvious adenopathy  appreciated. On cystoscopy, bladder dome intact, good efflux noted from bilateral ureteral orifices.   12/31/2021 Pathology Results   A. CULDESAC, POSTERIOR ADHESIONS,  EXCISION: -  Serosa and subserosal fibrous soft tissue with focal acute inflammation, reactive mesothelial hyperplasia and adhesions. -  Negative for malignancy.  B. UTERUS, CERVIX, BILATERAL TUBE AND OVAARIES, HYSTERECTOMY: - -  Carcinosarcoma (malignant mixed Mullerian tumor) with full-thickness posterior myometrial involvement with focal serosal present and extension into lower uterine segment and extensively in the cervical stroma. 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) versus focal partial limited involvement by a papillary component of the patient's known carcinosarcoma Peritoneal/Ascitic Fluid: N/A Lymphovascular Invasion: Not identified Regional Lymph Nodes: Not applicable; no lymph nodes submitted      PHYSICAL EXAMINATION: ECOG PERFORMANCE STATUS: 1 - Symptomatic but completely ambulatory  Vitals:   02/19/22 0902  BP: 129/85  Pulse: 73  Resp: 18  Temp: (!) 97.4 F (36.3 C)  SpO2: 100%   Filed Weights   02/19/22 0902  Weight: 199 lb  4.8 oz (90.4 kg)    GENERAL:alert, no distress and comfortable NEURO: alert & oriented x 3 with fluent speech, no focal motor/sensory deficits  LABORATORY DATA:  I have reviewed the data as listed    Component Value Date/Time   NA 141 02/19/2022 0841   K 3.7 02/19/2022 0841   CL 103 02/19/2022 0841   CO2 28 02/19/2022 0841   GLUCOSE 110 (H) 02/19/2022 0841   BUN 10 02/19/2022 0841   CREATININE 0.53 02/19/2022 0841   CALCIUM 9.5 02/19/2022 0841   PROT 7.1 02/19/2022 0841   ALBUMIN 3.9 02/19/2022 0841   AST 15 02/19/2022 0841   ALT 8 02/19/2022 0841   ALKPHOS 47 02/19/2022 0841   BILITOT 0.5 02/19/2022 0841   GFRNONAA >60 02/19/2022 0841    No results found for: "SPEP", "UPEP"  Lab Results  Component Value Date   WBC 7.1 02/19/2022   NEUTROABS 6.1 02/19/2022   HGB 10.6 (L) 02/19/2022   HCT 32.3 (L) 02/19/2022   MCV 87.5 02/19/2022   PLT 250 02/19/2022      Chemistry      Component Value Date/Time   NA 141 02/19/2022 0841   K 3.7 02/19/2022 0841   CL 103 02/19/2022 0841   CO2 28 02/19/2022 0841   BUN 10 02/19/2022 0841   CREATININE 0.53 02/19/2022 0841      Component Value Date/Time   CALCIUM 9.5 02/19/2022 0841   ALKPHOS 47 02/19/2022 0841   AST 15 02/19/2022 0841   ALT 8 02/19/2022 0841   BILITOT 0.5 02/19/2022 0841

## 2022-02-19 NOTE — Assessment & Plan Note (Addendum)
This is likely due to recent treatment. The patient denies recent history of bleeding such as epistaxis, hematuria or hematochezia. She is asymptomatic from the anemia. I will observe for now.   

## 2022-02-19 NOTE — Patient Instructions (Signed)
[facility] ONCOLOGY  Discharge Instructions: Thank you for choosing [facility] to provide your oncology and hematology care.   If you have a lab appointment with the [facility], please go directly to the [facility] and check in at the registration area.   Wear comfortable clothing and clothing appropriate for easy access to any Portacath or PICC line.   We strive to give you quality time with your provider. You may need to reschedule your appointment if you arrive late (15 or more minutes).  Arriving late affects you and other patients whose appointments are after yours.  Also, if you miss three or more appointments without notifying the office, you may be dismissed from the clinic at the provider's discretion.      For prescription refill requests, have your pharmacy contact our office and allow 72 hours for refills to be completed.    Today you received the following chemotherapy and/or immunotherapy agents Jemperli, Taxol, Carbo      To help prevent nausea and vomiting after your treatment, we encourage you to take your nausea medication as directed.  BELOW ARE SYMPTOMS THAT SHOULD BE REPORTED IMMEDIATELY: *FEVER GREATER THAN 100.4 F (38 C) OR HIGHER *CHILLS OR SWEATING *NAUSEA AND VOMITING THAT IS NOT CONTROLLED WITH YOUR NAUSEA MEDICATION *UNUSUAL SHORTNESS OF BREATH *UNUSUAL BRUISING OR BLEEDING *URINARY PROBLEMS (pain or burning when urinating, or frequent urination) *BOWEL PROBLEMS (unusual diarrhea, constipation, pain near the anus) TENDERNESS IN MOUTH AND THROAT WITH OR WITHOUT PRESENCE OF ULCERS (sore throat, sores in mouth, or a toothache) UNUSUAL RASH, SWELLING OR PAIN  UNUSUAL VAGINAL DISCHARGE OR ITCHING   Items with * indicate a potential emergency and should be followed up as soon as possible or go to the Emergency Department if any problems should occur.  Please show the CHEMOTHERAPY ALERT CARD or IMMUNOTHERAPY ALERT CARD at  check-in to the Emergency Department and triage nurse.  Should you have questions after your visit or need to cancel or reschedule your appointment, please contact [facility]  Dept: [phone]  and follow the prompts.  Office hours are 8:00 a.m. to 4:30 p.m. Monday - Friday. Please note that voicemails left after 4:00 p.m. may not be returned until the following business day.  We are closed weekends and major holidays. You have access to a nurse at all times for urgent questions. Please call the main number to the clinic Dept: [phone] and follow the prompts.   For any non-urgent questions, you may also contact your provider using MyChart. We now offer e-Visits for anyone 86 and older to request care online for non-urgent symptoms. For details visit [email or web address].   Also download the MyChart app! Go to the app store, search "MyChart", open the app, select [facility], and log in with your MyChart username and password.

## 2022-02-20 ENCOUNTER — Other Ambulatory Visit: Payer: Self-pay

## 2022-02-20 LAB — T4: T4, Total: 9.5 ug/dL (ref 4.5–12.0)

## 2022-02-24 ENCOUNTER — Other Ambulatory Visit: Payer: Self-pay

## 2022-02-25 ENCOUNTER — Telehealth: Payer: Self-pay | Admitting: Oncology

## 2022-02-25 ENCOUNTER — Other Ambulatory Visit: Payer: Self-pay

## 2022-02-25 ENCOUNTER — Telehealth: Payer: Self-pay | Admitting: *Deleted

## 2022-02-25 NOTE — Telephone Encounter (Addendum)
[name] (daughter) and Giah called regarding radiation appointments.  Reviewed all appointment dates and times and explained that the first day is longer because she will see the nurse, Dr. Sondra Come and have [facility] before her treatment.  They verbalized understanding and agreement and did not have any further questions.

## 2022-02-25 NOTE — Telephone Encounter (Signed)
CALLED PATIENT TO REMIND OF HDR Gilberts FOR 02-26-22- ARRIVAL TIME- 7:45 AM @ [facility], SPOKE WITH PATIENT AND SHE IS AWARE OF THESE APPTS.

## 2022-02-25 NOTE — Progress Notes (Signed)
  Radiation Oncology         [phone] ________________________________  Name: Sarah Wu MRN: [id number]  Date: 02/26/2022  DOB: [birth year]  CC: Levin Erp, DO  Lafonda Mosses, MD  HDR BRACHYTHERAPY NOTE  DIAGNOSIS: The encounter diagnosis was Malignant neoplasm of body of uterus, unspecified site [facility], [place]).   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, s/p 4 cycles neoadjuvant chemotherapy, hysterectomy, and BSO    Cancer Staging  Uterine cancer [facility]) Staging form: Corpus Uteri - Carcinoma and Carcinosarcoma, AJCC 8th Edition - Clinical stage from 09/15/2021: FIGO Stage IVB (cT3, cN2a, pM1) - Signed by Heath Lark, MD on 09/15/2021  Simple treatment device note: Patient had construction of her custom vaginal cylinder. She will be treated with a 3.0 cm diameter segmented cylinder. This conforms to her anatomy without undue discomfort.  Vaginal brachytherapy procedure node: The patient was brought to the [facility] suite. Identity was confirmed. All relevant records and images related to the planned course of therapy were reviewed. The patient freely provided informed written consent to proceed with treatment after reviewing the details related to the planned course of therapy. The consent form was witnessed and verified by the simulation staff. Then, the patient was set-up in a stable reproducible supine position for radiation therapy. Pelvic exam revealed the vaginal cuff to be intact . The patient's custom vaginal cylinder was placed in the proximal vagina. This was affixed to the CT/MR stabilization plate to prevent slippage. Patient tolerated the placement well.  Verification simulation note:  A fiducial marker was placed within the vaginal cylinder. An AP and lateral film was then obtained through the pelvis area. This documented accurate position of the vaginal cylinder for treatment.  HDR BRACHYTHERAPY TREATMENT  The remote  afterloading device was affixed to the vaginal cylinder by catheter. Patient then proceeded to undergo her first high-dose-rate treatment directed at the proximal vagina. The patient was prescribed a dose of 6.0 gray to be delivered to the mucosal surface. Treatment length was 3.0 cm. Patient was treated with 1 channel using 7 dwell positions. Treatment time was 181.5 seconds. Iridium 192 was the high-dose-rate source for treatment. The patient tolerated the treatment well. After completion of her therapy, a radiation survey was performed documenting return of the iridium source into the [employer] safe.   PLAN: The patient will return next week for her second high-dose-rate treatment.  ________________________________  -----------------------------------  Blair Promise, PhD, MD  This document serves as a record of services personally performed by Gery Pray, MD. It was created on his behalf by Roney Mans, a trained medical scribe. The creation of this record is based on the scribe's personal observations and the provider's statements to them. This document has been checked and approved by the attending provider.

## 2022-02-25 NOTE — Progress Notes (Signed)
Radiation Oncology         [phone] ________________________________  Name: Sarah Wu MRN: [id number]  Date: 02/26/2022  DOB: 03/29/1950  Vaginal Brachytherapy Procedure Note  CC: [name], [name], DO Lafonda Mosses, MD    ICD-10-CM   1. Malignant neoplasm of body of uterus, unspecified site [facility])  C54.9       Diagnosis: The encounter diagnosis was Malignant neoplasm of body of uterus, unspecified site [facility]).   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    In the interval since her initial consultation date, the patient followed up with Dr. Berline Lopes on 01/30/22. During which time, the patient endorsed feeling well since surgery.   The patient has also continued with chemotherapy consisting of jemperli, paclitaxol, carboplatin under the care of Dr. Alvy Bimler. Per her most recent follow-up visit with Dr. Alvy Bimler on 02/19/22, the patient's chemotherapy course was noted to be complicated by constipation. She also experienced some weight loss and anemia.   Otherwise, no significant interval history since the patient was seen in consultation.  Today she is present with her daughter. She reports to be doing well and denies any vaginal bleeding, abdominal bloating, or any other vaginal/abdominal specific complaints since she was last seen.   ALLERGIES: is allergic to hydrocodone-acetaminophen and oxycodone-acetaminophen.  Meds: Current Outpatient Medications  Medication Sig Dispense Refill   allopurinol (ZYLOPRIM) 300 MG  tablet Take 300 mg by mouth daily.     amLODipine (NORVASC) 5 MG tablet Take 5 mg by mouth at bedtime.     atenolol (TENORMIN) 50 MG tablet Take 50 mg by mouth daily.     atorvastatin (LIPITOR) 10 MG tablet Take 10 mg by mouth at bedtime.     dexamethasone (DECADRON) 4 MG tablet Take 2 tablets by mouth the night before and 2 tablets the morning of chemotherapy, every 3 weeks, for 6 cycles 36 tablet 6   lidocaine-prilocaine (EMLA) cream Apply to affected area once 30 g 3   Multiple Vitamins-Minerals (MULTIVITAMIN WITH MINERALS) tablet Take 1 tablet by mouth at bedtime. Centrum     ondansetron (ZOFRAN) 8 MG tablet Take 1 tablet by mouth every 8 hours as needed for nausea or vomiting. Start on the third day after chemotherapy. 30 tablet 1   polyethylene glycol (MIRALAX / GLYCOLAX) 17 g packet Take 17 g by mouth daily.     prochlorperazine (COMPAZINE) 10 MG tablet Take 1 tablet (10 mg total) by mouth every 6 (six) hours as needed for nausea or vomiting. 30 tablet 1   senna-docusate (SENOKOT-S) 8.6-50 MG tablet Take 2 tablets by mouth 2 (two) times daily. For AFTER surgery, do not take if having loose stools 30 tablet 0   ferrous sulfate 325 (65 FE) MG tablet Take 325 mg by mouth daily. (Patient not taking: Reported on 02/26/2022)     furosemide (LASIX) 20 MG tablet Take 1 tablet (20 mg total) by mouth daily. (Patient not taking: Reported on 02/26/2022) 30 tablet 0   No current facility-administered medications for this encounter.    Physical Findings: The patient is in no  acute distress. Patient is alert and oriented.  height is '5\' 8"'$  (1.727 m) and weight is 199 lb 6 oz (90.4 kg). Her temporal temperature is 96.6 F (35.9 C) (abnormal). Her blood pressure is 117/76 and her pulse is 84. Her respiration is 18 and oxygen saturation is 100%.   No palpable cervical, supraclavicular or axillary lymphoadenopathy. The heart has a regular rate and rhythm. The lungs are clear to auscultation. Abdomen soft and  non-tender.  On pelvic examination the external genitalia were unremarkable. A speculum exam was performed. Vaginal cuff intact, no mucosal lesions. Surgical sutures visualized at the vaginal cuff. On bimanual exam there were no pelvic masses appreciated.  Lab Findings: Lab Results  Component Value Date   WBC 7.1 02/19/2022   HGB 10.6 (L) 02/19/2022   HCT 32.3 (L) 02/19/2022   MCV 87.5 02/19/2022   PLT 250 02/19/2022    Radiographic Findings: No results found.  Impression: The encounter diagnosis was Malignant neoplasm of body of uterus, unspecified site [facility]).   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 The patient will be treated with a 3 cm diameter cylinder with a treatment length of 3 cm. This distended the vaginal vault without undue discomfort. The patient tolerated the procedure well.  The patient was successfully fitted for a vaginal cylinder. The patient is appropriate to begin vaginal brachytherapy.   Plan: The patient will proceed with CT simulation and vaginal brachytherapy today.    _______________________________   Leona Singleton, PA    Blair Promise, PhD, MD  This document serves as a record of services personally performed by Gery Pray, MD. It was created on his behalf by Roney Mans, a trained medical scribe. The creation of this record is based on the scribe's personal observations and the provider's statements to them. This document has been checked and approved by the attending provider.

## 2022-02-26 ENCOUNTER — Other Ambulatory Visit: Payer: Self-pay

## 2022-02-26 ENCOUNTER — Encounter: Payer: Self-pay | Admitting: Radiation Oncology

## 2022-02-26 ENCOUNTER — Ambulatory Visit
Admission: RE | Admit: 2022-02-26 | Discharge: 2022-02-26 | Disposition: A | Discharge status: Home / Self Care | Payer: Medicare Other | Source: Ambulatory Visit | Attending: Radiation Oncology | Admitting: Radiation Oncology

## 2022-02-26 VITALS — BP 117/76 | HR 84 | Temp 96.6°F | Resp 18 | Ht 68.0 in | Wt 199.4 lb

## 2022-02-26 DIAGNOSIS — C549 Malignant neoplasm of corpus uteri, unspecified: Secondary | ICD-10-CM

## 2022-02-26 DIAGNOSIS — C55 Malignant neoplasm of uterus, part unspecified: Secondary | ICD-10-CM | POA: Diagnosis present

## 2022-02-26 DIAGNOSIS — Z90710 Acquired absence of both cervix and uterus: Secondary | ICD-10-CM | POA: Diagnosis not present

## 2022-02-26 DIAGNOSIS — Z9221 Personal history of antineoplastic chemotherapy: Secondary | ICD-10-CM | POA: Insufficient documentation

## 2022-02-26 DIAGNOSIS — Z79899 Other long term (current) drug therapy: Secondary | ICD-10-CM | POA: Insufficient documentation

## 2022-02-26 LAB — RAD ONC ARIA SESSION SUMMARY
Course Elapsed Days: 0
Plan Fractions Treated to Date: 1
Plan Prescribed Dose Per Fraction: 6 Gy
Plan Total Fractions Prescribed: 5
Plan Total Prescribed Dose: 30 Gy
Reference Point Dosage Given to Date: 6 Gy
Reference Point Session Dosage Given: 6 Gy
Session Number: 1

## 2022-02-26 NOTE — Progress Notes (Addendum)
   Weight changes, if any: no  Bowel/Bladder complaints, if any: , Yes patient reports diarrhea and constipation.  Nausea/Vomiting, if any: no  Pain issues, if any:  no Patient denies any dysuria or hematuria  SAFETY ISSUES: Prior radiation? no Pacemaker/ICD? no Possible current pregnancy? no Is the patient on methotrexate? no  Current Complaints / other details:   Vitals:   02/26/22 0816  BP: 117/76  Pulse: 84  Resp: 18  Temp: (!) 96.6 F (35.9 C)  TempSrc: Temporal  SpO2: 100%  Weight: 90.4 kg  Height: '5\' 8"'$  (1.727 m)

## 2022-02-27 ENCOUNTER — Telehealth: Payer: Self-pay | Admitting: *Deleted

## 2022-02-27 NOTE — Telephone Encounter (Signed)
Called patient to inform of HDR Tx. on 03-12-22- arrival time- 7:15 am @ [facility], spoke with patient and she is aware of this tx.

## 2022-03-04 ENCOUNTER — Telehealth: Payer: Self-pay | Admitting: *Deleted

## 2022-03-04 NOTE — Telephone Encounter (Signed)
CALLED PATIENT TO REMIND OF HDR TX. FOR 03-05-22 @ 2 PM, SPOKE WITH PATIENT AND SHE IS AWARE OF THIS APPT.

## 2022-03-04 NOTE — Progress Notes (Signed)
  Radiation Oncology         [phone]) [phone] ________________________________  Name: Sarah Wu MRN: [id number]  Date: 03/05/2022  DOB: 1950/07/18  CC: Levin Erp, DO  Lafonda Mosses, MD  HDR BRACHYTHERAPY NOTE  DIAGNOSIS: The encounter diagnosis was Malignant neoplasm of body of uterus, unspecified site [facility]).   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 She will be treated with a 3.0 cm diameter segmented cylinder. This conforms to her anatomy without undue discomfort.  Vaginal brachytherapy procedure node: The patient was brought to the [place] suite. Identity was confirmed. All relevant records and images related to the planned course of therapy were reviewed. The patient freely provided informed written consent to proceed with treatment after reviewing the details related to the planned course of therapy. The consent form was witnessed and verified by the simulation staff. Then, the patient was set-up in a stable reproducible supine position for radiation therapy. Pelvic exam revealed the vaginal cuff to be intact . The patient's custom vaginal cylinder was placed in the proximal vagina. This was affixed to the CT/MR stabilization plate to prevent slippage. Patient tolerated the placement well.  Verification simulation note:  A fiducial marker was placed within the vaginal cylinder. An AP and lateral film was then obtained through the pelvis area. This documented accurate position of the vaginal cylinder for treatment.  HDR BRACHYTHERAPY TREATMENT  The remote  afterloading device was affixed to the vaginal cylinder by catheter. Patient then proceeded to undergo her second high-dose-rate treatment directed at the proximal vagina. The patient was prescribed a dose of 6.0 gray to be delivered to the mucosal surface. Treatment length was 3.0 cm. Patient was treated with 1 channel using 7 dwell positions. Treatment time was 193.9 seconds. Iridium 192 was the high-dose-rate source for treatment. The patient tolerated the treatment well. After completion of her therapy, a radiation survey was performed documenting return of the iridium source into the Gamma[employer] safe.   PLAN: The patient will return next week for her third high-dose-rate treatment.  ________________________________  -----------------------------------  Blair Promise, PhD, MD  This document serves as a record of services personally performed by Gery Pray, MD. It was created on his behalf by Roney Mans, a trained medical scribe. The creation of this record is based on the scribe's personal observations and the provider's statements to them. This document has been checked and approved by the attending provider.

## 2022-03-05 ENCOUNTER — Other Ambulatory Visit: Payer: Self-pay

## 2022-03-05 ENCOUNTER — Ambulatory Visit
Admission: RE | Admit: 2022-03-05 | Discharge: 2022-03-05 | Disposition: A | Discharge status: Home / Self Care | Payer: Medicare Other | Source: Ambulatory Visit | Attending: Radiation Oncology | Admitting: Radiation Oncology

## 2022-03-05 DIAGNOSIS — C549 Malignant neoplasm of corpus uteri, unspecified: Secondary | ICD-10-CM

## 2022-03-05 DIAGNOSIS — C55 Malignant neoplasm of uterus, part unspecified: Secondary | ICD-10-CM | POA: Diagnosis not present

## 2022-03-05 LAB — RAD ONC ARIA SESSION SUMMARY
Course Elapsed Days: 7
Plan Fractions Treated to Date: 2
Plan Prescribed Dose Per Fraction: 6 Gy
Plan Total Fractions Prescribed: 5
Plan Total Prescribed Dose: 30 Gy
Reference Point Dosage Given to Date: 12 Gy
Reference Point Session Dosage Given: 6 Gy
Session Number: 2

## 2022-03-11 ENCOUNTER — Ambulatory Visit: Payer: Medicare Other | Admitting: Radiation Oncology

## 2022-03-11 ENCOUNTER — Telehealth: Payer: Self-pay | Admitting: *Deleted

## 2022-03-11 MED FILL — Dexamethasone Sodium Phosphate Inj 100 MG/10ML: INTRAMUSCULAR | Qty: 1 | Status: AC

## 2022-03-11 MED FILL — Fosaprepitant Dimeglumine For IV Infusion 150 MG (Base Eq): INTRAVENOUS | Qty: 5 | Status: AC

## 2022-03-11 NOTE — Telephone Encounter (Signed)
CALLED PATIENT TO REMIND OF HDR TX. FOR 03-12-22- ARRIVAL TIME- 7:15 AM @ [place], SPOKE WITH PATIENT AND SHE IS AWARE OF THIS [place].

## 2022-03-11 NOTE — Progress Notes (Signed)
  Radiation Oncology         [phone] ________________________________  Name: Sarah Wu MRN: [id number]  Date: 03/12/2022  DOB: [birth year]  CC: Levin Erp, DO  Lafonda Mosses, MD  HDR BRACHYTHERAPY NOTE  DIAGNOSIS: The encounter diagnosis was Malignant neoplasm of body of uterus, unspecified site [facility]).   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) Staging form: Corpus Uteri - Carcinoma and Carcinosarcoma, AJCC 8th Edition - Clinical stage from 09/15/2021: FIGO Stage IVB (cT3, cN2a, pM1) - Signed by Heath Lark, MD on 09/15/2021  Simple treatment device note: Patient had construction of her custom vaginal cylinder. She will be treated with a 3.0 cm diameter segmented cylinder. This conforms to her anatomy without undue discomfort.  Vaginal brachytherapy procedure node: The patient was brought to the [place] suite. Identity was confirmed. All relevant records and images related to the planned course of therapy were reviewed. The patient freely provided informed written consent to proceed with treatment after reviewing the details related to the planned course of therapy. The consent form was witnessed and verified by the simulation staff. Then, the patient was set-up in a stable reproducible supine position for radiation therapy. Pelvic exam revealed the vaginal cuff to be intact . The patient's custom vaginal cylinder was placed in the proximal vagina. This was affixed to the CT/MR stabilization plate to prevent slippage. Patient tolerated the placement well.  Verification simulation note:  A fiducial marker was placed within the vaginal cylinder. An AP and lateral film was then obtained through the pelvis area. This documented accurate position of the vaginal cylinder for treatment.  HDR BRACHYTHERAPY TREATMENT  The remote  afterloading device was affixed to the vaginal cylinder by catheter. Patient then proceeded to undergo her third high-dose-rate treatment directed at the proximal vagina. The patient was prescribed a dose of 6.0 gray to be delivered to the mucosal surface. Treatment length was 3.0 cm. Patient was treated with 1 channel using 7 dwell positions. Treatment time was 206.9 seconds. Iridium 192 was the high-dose-rate source for treatment. The patient tolerated the treatment well. After completion of her therapy, a radiation survey was performed documenting return of the iridium source into the Gamma[employer] safe.   PLAN: The patient will return next week for her fourth high-dose-rate treatment.  ________________________________  -----------------------------------  Blair Promise, PhD, MD  This document serves as a record of services personally performed by Gery Pray, MD. It was created on his behalf by Roney Mans, a trained medical scribe. The creation of this record is based on the scribe's personal observations and the provider's statements to them. This document has been checked and approved by the attending provider.

## 2022-03-12 ENCOUNTER — Ambulatory Visit
Admission: RE | Admit: 2022-03-12 | Discharge: 2022-03-12 | Disposition: A | Discharge status: Home / Self Care | Payer: Medicare Other | Source: Ambulatory Visit | Attending: Radiation Oncology | Admitting: Radiation Oncology

## 2022-03-12 ENCOUNTER — Other Ambulatory Visit: Payer: Self-pay

## 2022-03-12 ENCOUNTER — Inpatient Hospital Stay: Payer: Medicare Other | Attending: Gynecologic Oncology | Admitting: Hematology and Oncology

## 2022-03-12 ENCOUNTER — Inpatient Hospital Stay: Payer: Medicare Other

## 2022-03-12 ENCOUNTER — Encounter: Payer: Self-pay | Admitting: Hematology and Oncology

## 2022-03-12 ENCOUNTER — Ambulatory Visit: Payer: Medicare Other | Admitting: Radiation Oncology

## 2022-03-12 VITALS — BP 120/73 | HR 87 | Temp 97.4°F | Resp 18 | Ht 68.0 in | Wt 185.0 lb

## 2022-03-12 DIAGNOSIS — C549 Malignant neoplasm of corpus uteri, unspecified: Secondary | ICD-10-CM

## 2022-03-12 DIAGNOSIS — Z79899 Other long term (current) drug therapy: Secondary | ICD-10-CM | POA: Insufficient documentation

## 2022-03-12 DIAGNOSIS — T451X5A Adverse effect of antineoplastic and immunosuppressive drugs, initial encounter: Secondary | ICD-10-CM | POA: Diagnosis not present

## 2022-03-12 DIAGNOSIS — C55 Malignant neoplasm of uterus, part unspecified: Secondary | ICD-10-CM | POA: Diagnosis present

## 2022-03-12 DIAGNOSIS — Z5111 Encounter for antineoplastic chemotherapy: Secondary | ICD-10-CM | POA: Insufficient documentation

## 2022-03-12 DIAGNOSIS — D61818 Other pancytopenia: Secondary | ICD-10-CM | POA: Insufficient documentation

## 2022-03-12 DIAGNOSIS — C801 Malignant (primary) neoplasm, unspecified: Secondary | ICD-10-CM

## 2022-03-12 DIAGNOSIS — G620 Drug-induced polyneuropathy: Secondary | ICD-10-CM

## 2022-03-12 LAB — CBC WITH DIFFERENTIAL (CANCER CENTER ONLY)
Abs Immature Granulocytes: 0.02 10*3/uL (ref 0.00–0.07)
Basophils Absolute: 0 10*3/uL (ref 0.0–0.1)
Basophils Relative: 0 %
Eosinophils Absolute: 0 10*3/uL (ref 0.0–0.5)
Eosinophils Relative: 0 %
HCT: 32.4 % — ABNORMAL LOW (ref 36.0–46.0)
Hemoglobin: 10.4 g/dL — ABNORMAL LOW (ref 12.0–15.0)
Immature Granulocytes: 0 %
Lymphocytes Relative: 8 %
Lymphs Abs: 0.7 10*3/uL (ref 0.7–4.0)
MCH: 28.6 pg (ref 26.0–34.0)
MCHC: 32.1 g/dL (ref 30.0–36.0)
MCV: 89 fL (ref 80.0–100.0)
Monocytes Absolute: 0.1 10*3/uL (ref 0.1–1.0)
Monocytes Relative: 1 %
Neutro Abs: 8.6 10*3/uL — ABNORMAL HIGH (ref 1.7–7.7)
Neutrophils Relative %: 91 %
Platelet Count: 220 10*3/uL (ref 150–400)
RBC: 3.64 MIL/uL — ABNORMAL LOW (ref 3.87–5.11)
RDW: 16.8 % — ABNORMAL HIGH (ref 11.5–15.5)
WBC Count: 9.5 10*3/uL (ref 4.0–10.5)
nRBC: 0 % (ref 0.0–0.2)

## 2022-03-12 LAB — CMP (CANCER CENTER ONLY)
ALT: 8 U/L (ref 0–44)
AST: 15 U/L (ref 15–41)
Albumin: 3.6 g/dL (ref 3.5–5.0)
Alkaline Phosphatase: 54 U/L (ref 38–126)
Anion gap: 15 (ref 5–15)
BUN: 10 mg/dL (ref 8–23)
CO2: 27 mmol/L (ref 22–32)
Calcium: 9.2 mg/dL (ref 8.9–10.3)
Chloride: 98 mmol/L (ref 98–111)
Creatinine: 0.58 mg/dL (ref 0.44–1.00)
GFR, Estimated: >60 mL/min (ref >60)
Glucose, Bld: 111 mg/dL — ABNORMAL HIGH (ref 70–99)
Potassium: 2.9 mmol/L — ABNORMAL LOW (ref 3.5–5.1)
Sodium: 140 mmol/L (ref 135–145)
Total Bilirubin: 0.9 mg/dL (ref 0.3–1.2)
Total Protein: 7.1 g/dL (ref 6.5–8.1)

## 2022-03-12 LAB — SAMPLE TO BLOOD BANK

## 2022-03-12 LAB — RAD ONC ARIA SESSION SUMMARY
Course Elapsed Days: 14
Plan Fractions Treated to Date: 3
Plan Prescribed Dose Per Fraction: 6 Gy
Plan Total Fractions Prescribed: 5
Plan Total Prescribed Dose: 30 Gy
Reference Point Dosage Given to Date: 18 Gy
Reference Point Session Dosage Given: 6 Gy
Session Number: 3

## 2022-03-12 LAB — TSH: TSH: 0.274 u[IU]/mL — ABNORMAL LOW (ref 0.350–4.500)

## 2022-03-12 MED ORDER — SODIUM CHLORIDE 0.9 % IV SOLN
10.0000 mg | Freq: Once | INTRAVENOUS | Status: AC
  Administered 2022-03-12: 10 mg via INTRAVENOUS
  Filled 2022-03-12: qty 10

## 2022-03-12 MED ORDER — SODIUM CHLORIDE 0.9% FLUSH
10.0000 mL | INTRAVENOUS | Status: DC | PRN
  Administered 2022-03-12: 10 mL

## 2022-03-12 MED ORDER — SODIUM CHLORIDE 0.9 % IV SOLN: Freq: Once | INTRAVENOUS | Status: AC

## 2022-03-12 MED ORDER — FAMOTIDINE IN NACL 20-0.9 MG/50ML-% IV SOLN
20.0000 mg | Freq: Once | INTRAVENOUS | Status: AC
  Administered 2022-03-12: 20 mg via INTRAVENOUS
  Filled 2022-03-12: qty 50

## 2022-03-12 MED ORDER — SODIUM CHLORIDE 0.9 % IV SOLN
140.0000 mg/m2 | Freq: Once | INTRAVENOUS | Status: AC
  Administered 2022-03-12: 294 mg via INTRAVENOUS
  Filled 2022-03-12: qty 49

## 2022-03-12 MED ORDER — CETIRIZINE HCL 10 MG/ML IV SOLN
10.0000 mg | Freq: Once | INTRAVENOUS | Status: AC
  Administered 2022-03-12: 10 mg via INTRAVENOUS
  Filled 2022-03-12: qty 1

## 2022-03-12 MED ORDER — HEPARIN SOD (PORK) LOCK FLUSH 100 UNIT/ML IV SOLN
500.0000 [IU] | Freq: Once | INTRAVENOUS | Status: AC | PRN
  Administered 2022-03-12: 500 [IU]

## 2022-03-12 MED ORDER — SODIUM CHLORIDE 0.9 % IV SOLN
150.0000 mg | Freq: Once | INTRAVENOUS | Status: AC
  Administered 2022-03-12: 150 mg via INTRAVENOUS
  Filled 2022-03-12: qty 150

## 2022-03-12 MED ORDER — SODIUM CHLORIDE 0.9 % IV SOLN
500.0000 mg | Freq: Once | INTRAVENOUS | Status: AC
  Administered 2022-03-12: 500 mg via INTRAVENOUS
  Filled 2022-03-12: qty 10

## 2022-03-12 MED ORDER — PROCHLORPERAZINE MALEATE 10 MG PO TABS: 10.0000 mg | ORAL_TABLET | Freq: Four times a day (QID) | ORAL | 1 refills | Status: DC | PRN

## 2022-03-12 MED ORDER — SODIUM CHLORIDE 0.9% FLUSH
10.0000 mL | Freq: Once | INTRAVENOUS | Status: AC
  Administered 2022-03-12: 10 mL

## 2022-03-12 MED ORDER — PALONOSETRON HCL INJECTION 0.25 MG/5ML
0.2500 mg | Freq: Once | INTRAVENOUS | Status: AC
  Administered 2022-03-12: 0.25 mg via INTRAVENOUS
  Filled 2022-03-12: qty 5

## 2022-03-12 MED ORDER — SODIUM CHLORIDE 0.9 % IV SOLN
488.0000 mg | Freq: Once | INTRAVENOUS | Status: AC
  Administered 2022-03-12: 500 mg via INTRAVENOUS
  Filled 2022-03-12: qty 50

## 2022-03-12 NOTE — Progress Notes (Signed)
[place] OFFICE PROGRESS NOTE  Patient Care Team: [name], [name], DO as PCP - General (Family Medicine)  ASSESSMENT & PLAN:  Uterine cancer [facility]) Her recent treatment course was complicated by constipation, anemia and neuropathy She has lost some weight I recommend aggressive laxative therapy She will complete treatment today I plan to order CT imaging before her next visit Moving forward, after completion of last cycle of chemotherapy, she will only receive maintenance immunotherapy in the future We discussed the risk and benefits of maintenance treatment  Pancytopenia, acquired ([place]) Her blood counts are stable She does not need transfusion support  Peripheral neuropathy due to chemotherapy [facility]) This is stable on dose reduced chemotherapy We will continue the same  Orders Placed This Encounter  Procedures   CT ABDOMEN PELVIS W CONTRAST    Standing Status:   Future    Standing Expiration Date:   03/13/2023    Order Specific Question:   If indicated for the ordered procedure, I authorize the administration of contrast media per Radiology protocol    Answer:   Yes    Order Specific Question:   Preferred imaging location?    Answer:   [facility]    Order Specific Question:   Radiology Contrast Protocol - do NOT remove file path    Answer:   [email or web address]    All questions were answered. The patient knows to call the clinic with any problems, questions or concerns. The total time spent in the appointment was 30 minutes encounter with patients including review of chart and various tests results, discussions about plan of care and coordination of care plan   Heath Lark, MD 03/12/2022 12:53 PM  INTERVAL HISTORY: Please see below for problem oriented charting. she returns for treatment follow-up with her family.  She has lost weight but she felt that she is eating fine Denies nausea Denies worsening  peripheral neuropathy We discussed the role of maintenance treatment in the future  REVIEW OF SYSTEMS:   Constitutional: Denies fevers, chills Eyes: Denies blurriness of vision Ears, nose, mouth, throat, and face: Denies mucositis or sore throat Respiratory: Denies cough, dyspnea or wheezes Cardiovascular: Denies palpitation, chest discomfort or lower extremity swelling Skin: Denies abnormal skin rashes Lymphatics: Denies new lymphadenopathy or easy bruising Behavioral/Psych: Mood is stable, no new changes  All other systems were reviewed with the patient and are negative.  I have reviewed the past medical history, past surgical history, social history and family history with the patient and they are unchanged from previous note.  ALLERGIES:  is allergic to hydrocodone-acetaminophen and oxycodone-acetaminophen.  MEDICATIONS:  Current Outpatient Medications  Medication Sig Dispense Refill   allopurinol (ZYLOPRIM) 300 MG tablet Take 300 mg by mouth daily.     amLODipine (NORVASC) 5 MG tablet Take 5 mg by mouth at bedtime.     atenolol (TENORMIN) 50 MG tablet Take 50 mg by mouth daily.     atorvastatin (LIPITOR) 10 MG tablet Take 10 mg by mouth at bedtime.     furosemide (LASIX) 20 MG tablet Take 1 tablet (20 mg total) by mouth daily. (Patient not taking: Reported on 02/26/2022) 30 tablet 0   lidocaine-prilocaine (EMLA) cream Apply to affected area once 30 g 3   Multiple Vitamins-Minerals (MULTIVITAMIN WITH MINERALS) tablet Take 1 tablet by mouth at bedtime. Centrum     ondansetron (ZOFRAN) 8 MG tablet Take 1 tablet by mouth every 8 hours as needed for nausea or vomiting. Start on the third  day after chemotherapy. 30 tablet 1   polyethylene glycol (MIRALAX / GLYCOLAX) 17 g packet Take 17 g by mouth daily.     prochlorperazine (COMPAZINE) 10 MG tablet Take 1 tablet (10 mg total) by mouth every 6 (six) hours as needed for nausea or vomiting. 30 tablet 1   No current facility-administered  medications for this visit.   Facility-Administered Medications Ordered in Other Visits  Medication Dose Route Frequency Provider Last Rate Last Admin   CARBOplatin (PARAPLATIN) 500 mg in sodium chloride 0.9 % 250 mL chemo infusion  500 mg Intravenous Once Alvy Bimler, Erling Arrazola, MD       heparin lock flush 100 unit/mL  500 Units Intracatheter Once PRN Alvy Bimler, Jayden Rudge, MD       PACLitaxel (TAXOL) 294 mg in sodium chloride 0.9 % 250 mL chemo infusion (> '80mg'$ /m2)  140 mg/m2 (Treatment Plan Recorded) Intravenous Once Alvy Bimler, Starletta Houchin, MD 100 mL/hr at 03/12/22 1156 294 mg at 03/12/22 1156   sodium chloride flush (NS) 0.9 % injection 10 mL  10 mL Intracatheter PRN Heath Lark, MD        SUMMARY OF ONCOLOGIC HISTORY: Oncology History Overview Note  Outside path showed carcinosarcoma, PD-L1 positive, MSI stable   Uterine cancer ([place])  07/30/2021 Initial Diagnosis   She was evaluated for post-menopausal bleeding. She reported having been seen a year previously and having a biopsy that was negative.  On exam in clinic, she was noted to have a mass present within the vagina.  This was thought to be a prolapsing fibroid and plan was made for exam under anesthesia, removal prolapsing fibroid with hysteroscopy and D&C   08/11/2021 Pathology Results   PD-L1 [phone]):                                             Does meet FDA-approval, Combined Positive Score (CPS): >=1   Pan-TRK IHC:                                              Tumor cells are Focally Positive for TRK protein expression.   MICROSATELLITE INSTABILITY:             MS-Stable   TUMOR MUTATION BURDEN:                10.8 Muts/Mb TMB-Low   OTHER BIOMARKERS:                              MET amplification PIK3CA amplification MDM2 amplification CCNE1 amplification   Pertinent Negative Biomarkers Evaluated by NGS: ALK ATM BARD1 BRAF BRCA1 BRCA2 CDK12 CHECK1 CHEK2 EGFR  ERBB2 ESR1 FANCL FGFR2 FGFR3 IDH1 IDH2 KIT KRAS NRAS NTRK1 NTRK2 NTRK3 PALB2 PDGFRA RAD51B RAD51C   RAD51D RAD54L RET ROS1   Comment:  The above studies are performed and reported by [facility] [place]) as part of the [facility] (NGS) panel.  Pertinent results are summarized above.  Please see their separate reports for more detailed information  Addendum electronically signed by Elsworth Soho, MD on 09/03/2021 at  7:37 AM  Final Diagnosis    A.  CERVICAL MASS, EXCISION:       - CONSISTENT WITH CARCINOSARCOMA (HIGH GRADE MALIGNANT BIPHASIC  TUMOR)  Electronically signed by Elsworth Soho, MD on 08/15/2021 at  9:53 AM  Comment    The malignant epithelial component exhibits high-grade cytomorphology and immunostaining pattern consistent with serous carcinoma to include strong positive diffuse reactivity for p53, p16, and vimentin, focal subset CK7 and CK AE1/AE3 expression, with high Ki-67 staining (approximately 60%).  Neuroendocrine differentiation is present (CD56 negative but synaptophysin positive).  The stromal component is composed of malignant mitotically active cells and heterologous elements with focal chondroid differentiation (S100 positive).  SMA appears to decorate rare tumor cells and highlights vascular structures.  The tumor cells are non-reactive for CK20, WT1, GATA3, p40 and desmin.  Block A5 is referred to [facility] [place]) to perform molecular characterization.  These findings are shared with Dr. Luanne Bras on [phone] at [phone] hours by telephone conversation. This case received prospective intradepartmental quality assurance review.   Clinical Information    Cervical mass  Gross Description    A. Cervix Received fresh labeled with the patient's name and date of birth and "cervical mass frozen section" per container is an aggregate of red-tan friable soft tissue measuring approximately 5 x 5 x 3 cm in toto.  Representative sections are frozen for an intraoperative consultation.  Representative sections are submitted as follows:  A1-A2: Frozen section  remnants A3-A5: Additional sections  Time of formalin addition: 08/11/2021 8:12 AM    Intraoperative Consultation    A. 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   Results rendered by Dr. Wyatt Portela and reported to Dr. Macarthur Critchley at [facility] on 08/11/2021 at 8:06 AM.  Microscopic Description    Microscopic examination supports the above diagnosis.  Single antibody immunostains are performed to include: S100, p40, p16, p53, PAX8, CK7, CK20, vimentin, CK AE1/3, GATA3, WT1, desmin, SMA, synaptophysin, CD56, and Ki-67.  All controls are appropriately reactive. [id number], [id number], [id number], [id number], [id number], [id number] (15)          08/22/2021 Initial Diagnosis   Carcinosarcoma ([place])   09/01/2021 Imaging   CT abdomen and pelvis There is marked enlargement of the uterus measuring 13.7 x 10 x 8 x 10.1 cm. There is inhomogeneous enhancement in the uterus consistent with malignant neoplasm.   There are enlarged lymph nodes in the retroperitoneum in the para-and paracaval regions measuring up to 12 mm in short axis. There are a few enlarged lymph nodes adjacent to the iliac vessels largest measuring 2.6 x 1.4 cm adjacent to the right external iliac vessels. Findings suggest possible metastatic lymphadenopathy. There is mild prominence of the pelvocaliceal system in the left kidney and proximal left ureter. Possibility of extrinsic compression or infiltrative process related to the uterine neoplasm in the distal course of left ureter causing mild obstruction is not excluded.   Gallbladder stone.  Diverticulosis of colon.   Other findings as described in the body of the report.   09/01/2021 Imaging   US pelvis Large central uterine ill-defined mass approximately 7.7 cm diameter enlarging uterus likely representing the biopsy-proven neoplasm, likely of endometrial origin.   Questionable intramural leiomyoma posterior mid uterus 3.1 cm diameter.   The mass extend and the adnexal regions  are poorly characterized but may potentially be better evaluated by MR imaging with and without contrast.   09/10/2021 Surgery   Pre-operative Diagnosis: carcinosarcoma of the uterus, adenopathy on imaging   Post-operative Diagnosis: same, stage IVB carcinosarcoma of the uterus   Operation: Diagnostic laparoscopy    Surgeon: Jeral Pinch MD Operative Findings:  On EUA, cervix 2 cm dilated with tumor noted within the os. Cervix otherwise normal appearing. Uterus enlarged, 12 cm, with limited mobility secondary to weight. Compression of the rectum from uterus although no direct invasion appreciated. On intra-abdominal inspection, adhesions noted between the liver and the anterior abdominal wall. Evidence of carcinomatosis involving liver surface, diaphragm, omentum, anterior abdominal wall. Surface of the uterus with obvious tumor infiltration. Small bowel free from uterus. Posteriorly, sigmoid densely adherent to the posterior uterus. Even with manipulator, unable to move the uterus anteriorly. Minimal ascites.    Given findings of stage IV disease and what would require large bowel resection and end ostomy, decision made to abort surgery in favor of neoadjuvant chemotherapy. I had discussed this as a distinct possibility when we had gotten her CT results as well as this morning prior to the surgery.    09/15/2021 Cancer Staging   Staging form: Corpus Uteri - Carcinoma and Carcinosarcoma, AJCC 8th Edition - Clinical stage from 09/15/2021: FIGO Stage IVB (cT3, cN2a, pM1) - Signed by Heath Lark, MD on 09/15/2021 Stage prefix: Initial diagnosis   09/24/2021 -  Chemotherapy   Patient is on Treatment Plan : UTERINE ENDOMETRIAL Dostarlimab-gxly (500 mg) + Carboplatin (AUC 5) + Paclitaxel (175 mg/m2) q21d x 6 cycles / Dostarlimab-gxly (1000 mg) q42d x 6 cycles      10/17/2021 Procedure   Status post revision of nonfunctional right IJ port catheter, with new right IJ port catheter placed. Port is ready for  use.     11/24/2021 Imaging   1. Today's study demonstrates a positive response to therapy with partial involution of large malignant appearing uterine mass. 2. Multiple prominent borderline enlarged and mildly enlarged retroperitoneal and pelvic lymph nodes are stable to minimally decreased in size compared to the prior study, potentially metastatic. No new lymphadenopathy is noted elsewhere in the chest, abdomen or pelvis. 3. Multiple small pulmonary nodules measuring 5 mm or less in the lungs. These were incidentally imaged on the prior CT of the abdomen and pelvis 09/01/2021, and at this time are stable. The possibility of metastatic disease is not excluded, and close attention on follow-up studies is recommended. 4. Colonic diverticulosis without evidence of acute diverticulitis at this time. 5. Additional incidental findings, as above.     12/31/2021 Surgery   Robotic-assisted laparoscopic total hysterectomy with bilateral salpingo-oophorectomy, lysis of adhesions for approximately 40 minutes, cystoscopy   Findings: On EUA, moderately mobile enlarged uterus.  On intra-abdominal entry, filmy adhesions between the anterior liver and the diaphragm bilaterally.  No carcinomatosis noted on the diaphragm or liver.  Normal-appearing stomach, small bowel and omentum.  No peritoneal lesions in the lower abdominal cavity or pelvis.  No ascites.  Uterus enlarged measuring approximately 12-14 cm, bulbous at the top.  Posterior 2 cm fibroid near the fundus.  Posterior aspect of the uterus densely adherent to the colon mesentery.  Somewhat dilated fluid-filled left fallopian tube.  Normal-appearing right adnexa.  Left ovary adherent to the broad ligament and sigmoid mesentery but otherwise normal-appearing.  Significant fibrosis bilaterally of the retroperitoneum.  No obvious adenopathy appreciated. On cystoscopy, bladder dome intact, good efflux noted from bilateral ureteral orifices.   12/31/2021  Pathology Results   A. CULDESAC, POSTERIOR ADHESIONS, EXCISION: -  Serosa and subserosal fibrous soft tissue with focal acute inflammation, reactive mesothelial hyperplasia and adhesions. -  Negative for malignancy.  B. UTERUS, CERVIX, BILATERAL TUBE AND OVAARIES, HYSTERECTOMY: - -  Carcinosarcoma (malignant mixed Mullerian tumor) with full-thickness posterior  myometrial involvement with focal serosal present and extension into lower uterine segment and extensively in the cervical stroma. 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) versus focal partial limited involvement by a papillary component of the patient's known carcinosarcoma Peritoneal/Ascitic Fluid: N/A Lymphovascular Invasion: Not identified Regional Lymph Nodes: Not applicable; no lymph nodes submitted      PHYSICAL EXAMINATION: ECOG PERFORMANCE STATUS: 2 - Symptomatic, <50% confined to bed  Vitals:   03/12/22 0903  BP: 120/73  Pulse: 87  Resp: 18  Temp: (!) 97.4 F (36.3 C)  SpO2: 99%   Filed Weights   03/12/22 0903  Weight: 185 lb (83.9 kg)    GENERAL:alert, no distress and comfortable  NEURO: alert & oriented x 3 with fluent speech, no focal  motor/sensory deficits  LABORATORY DATA:  I have reviewed the data as listed    Component Value Date/Time   NA 140 03/12/2022 0851   K 2.9 (L) 03/12/2022 0851   CL 98 03/12/2022 0851   CO2 27 03/12/2022 0851   GLUCOSE 111 (H) 03/12/2022 0851   BUN 10 03/12/2022 0851   CREATININE 0.58 03/12/2022 0851   CALCIUM 9.2 03/12/2022 0851   PROT 7.1 03/12/2022 0851   ALBUMIN 3.6 03/12/2022 0851   AST 15 03/12/2022 0851   ALT 8 03/12/2022 0851   ALKPHOS 54 03/12/2022 0851   BILITOT 0.9 03/12/2022 0851   GFRNONAA >60 03/12/2022 0851    No results found for: "SPEP", "UPEP"  Lab Results  Component Value Date   WBC 9.5 03/12/2022   NEUTROABS 8.6 (H) 03/12/2022   HGB 10.4 (L) 03/12/2022   HCT 32.4 (L) 03/12/2022   MCV 89.0 03/12/2022   PLT 220 03/12/2022      Chemistry      Component Value Date/Time   NA 140 03/12/2022 0851   K 2.9 (L) 03/12/2022 0851   CL 98 03/12/2022 0851   CO2 27 03/12/2022 0851   BUN 10 03/12/2022 0851   CREATININE 0.58 03/12/2022 0851      Component Value Date/Time   CALCIUM 9.2 03/12/2022 0851   ALKPHOS 54 03/12/2022 0851   AST 15 03/12/2022 0851   ALT 8 03/12/2022 0851   BILITOT 0.9 03/12/2022 0851

## 2022-03-12 NOTE — Assessment & Plan Note (Addendum)
Her recent treatment course was complicated by constipation, anemia and neuropathy She has lost some weight I recommend aggressive laxative therapy She will complete treatment today I plan to order CT imaging before her next visit Moving forward, after completion of last cycle of chemotherapy, she will only receive maintenance immunotherapy in the future We discussed the risk and benefits of maintenance treatment

## 2022-03-12 NOTE — Patient Instructions (Signed)
[facility]  Discharge Instructions: Thank you for choosing [facility] to provide your oncology and hematology care.   If you have a lab appointment with the [facility], please go directly to the [facility] and check in at the registration area.   Wear comfortable clothing and clothing appropriate for easy access to any Portacath or PICC line.   We strive to give you quality time with your provider. You may need to reschedule your appointment if you arrive late (15 or more minutes).  Arriving late affects you and other patients whose appointments are after yours.  Also, if you miss three or more appointments without notifying the office, you may be dismissed from the clinic at the provider's discretion.      For prescription refill requests, have your pharmacy contact our office and allow 72 hours for refills to be completed.    Today you received the following chemotherapy and/or immunotherapy agents jemperli, paclitaxol, carboplatin      To help prevent nausea and vomiting after your treatment, we encourage you to take your nausea medication as directed.  BELOW ARE SYMPTOMS THAT SHOULD BE REPORTED IMMEDIATELY: *FEVER GREATER THAN 100.4 F (38 C) OR HIGHER *CHILLS OR SWEATING *NAUSEA AND VOMITING THAT IS NOT CONTROLLED WITH YOUR NAUSEA MEDICATION *UNUSUAL SHORTNESS OF BREATH *UNUSUAL BRUISING OR BLEEDING *URINARY PROBLEMS (pain or burning when urinating, or frequent urination) *BOWEL PROBLEMS (unusual diarrhea, constipation, pain near the anus) TENDERNESS IN MOUTH AND THROAT WITH OR WITHOUT PRESENCE OF ULCERS (sore throat, sores in mouth, or a toothache) UNUSUAL RASH, SWELLING OR PAIN  UNUSUAL VAGINAL DISCHARGE OR ITCHING   Items with * indicate a potential emergency and should be followed up as soon as possible or go to the Emergency Department if any problems should occur.  Please show the CHEMOTHERAPY ALERT CARD or  IMMUNOTHERAPY ALERT CARD at check-in to the Emergency Department and triage nurse.  Should you have questions after your visit or need to cancel or reschedule your appointment, please contact [facility]  Dept: [phone]  and follow the prompts.  Office hours are 8:00 a.m. to 4:30 p.m. Monday - Friday. Please note that voicemails left after 4:00 p.m. may not be returned until the following business day.  We are closed weekends and major holidays. You have access to a nurse at all times for urgent questions. Please call the main number to the clinic Dept: [phone] and follow the prompts.   For any non-urgent questions, you may also contact your provider using MyChart. We now offer e-Visits for anyone 69 and older to request care online for non-urgent symptoms. For details visit [email or web address].   Also download the MyChart app! Go to the app store, search "MyChart", open the app, select [facility], and log in with your MyChart username and password.  Masks are optional in the cancer centers. If you would like for your care team to wear a mask while they are taking care of you, please let them know. You may have one support Kimi Kroft who is at least 73 years old accompany you for your appointments.

## 2022-03-12 NOTE — Assessment & Plan Note (Signed)
This is stable on dose reduced chemotherapy We will continue the same

## 2022-03-12 NOTE — Assessment & Plan Note (Signed)
Her blood counts are stable She does not need transfusion support

## 2022-03-14 LAB — T4: T4, Total: 10.6 ug/dL (ref 4.5–12.0)

## 2022-03-17 ENCOUNTER — Other Ambulatory Visit: Payer: Self-pay | Admitting: Radiation Oncology

## 2022-03-17 ENCOUNTER — Telehealth: Payer: Self-pay | Admitting: *Deleted

## 2022-03-17 ENCOUNTER — Telehealth: Payer: Self-pay

## 2022-03-17 MED ORDER — DIPHENOXYLATE-ATROPINE 2.5-0.025 MG PO TABS: 2.0000 | ORAL_TABLET | Freq: Four times a day (QID) | ORAL | 0 refills | Status: DC | PRN

## 2022-03-17 NOTE — Telephone Encounter (Signed)
Called placed to patient to make aware that Dr. Sondra Come to send in prescription for Lomotil to [facility] in [place]. Patient and daughter voiced understanding.

## 2022-03-17 NOTE — Progress Notes (Incomplete)
  Radiation Oncology         [phone] ________________________________  Name: Sarah Wu MRN: [id number]  Date: 03/18/2022  DOB: 10-Mar-1950  CC: Levin Erp, DO  Lafonda Mosses, MD  HDR BRACHYTHERAPY NOTE  DIAGNOSIS: The encounter diagnosis was Malignant neoplasm of body of uterus, unspecified site [facility]).   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, s/p 4 cycles neoadjuvant chemotherapy, hysterectomy, and BSO    Cancer Staging  Uterine cancer [facility]) Staging form: Corpus Uteri - Carcinoma and Carcinosarcoma, AJCC 8th Edition - Clinical stage from 09/15/2021: FIGO Stage IVB (cT3, cN2a, pM1) - Signed by Heath Lark, MD on 09/15/2021  Simple treatment device note: Patient had construction of her custom vaginal cylinder. She will be treated with a 3.0 cm diameter segmented cylinder. This conforms to her anatomy without undue discomfort.  Vaginal brachytherapy procedure node: The patient was brought to the [place] suite. Identity was confirmed. All relevant records and images related to the planned course of therapy were reviewed. The patient freely provided informed written consent to proceed with treatment after reviewing the details related to the planned course of therapy. The consent form was witnessed and verified by the simulation staff. Then, the patient was set-up in a stable reproducible supine position for radiation therapy. Pelvic exam revealed the vaginal cuff to be intact . The patient's custom vaginal cylinder was placed in the proximal vagina. This was affixed to the CT/MR stabilization plate to prevent slippage. Patient tolerated the placement well.  Verification simulation note:  A fiducial marker was placed within the vaginal cylinder. An AP and lateral film was then obtained through the pelvis area. This documented accurate position of the vaginal cylinder for treatment.  HDR BRACHYTHERAPY TREATMENT  The remote  afterloading device was affixed to the vaginal cylinder by catheter. Patient then proceeded to undergo her fourth high-dose-rate treatment directed at the proximal vagina. The patient was prescribed a dose of 6.0 gray to be delivered to the mucosal surface. Treatment length was 3.0 cm. Patient was treated with 1 channel using 7 dwell positions. Treatment time was *** seconds. Iridium 192 was the high-dose-rate source for treatment. The patient tolerated the treatment well. After completion of her therapy, a radiation survey was performed documenting return of the iridium source into the [employer] safe.   PLAN: The patient will return next week for her fifth high-dose-rate treatment.  ________________________________  -----------------------------------  Blair Promise, PhD, MD  This document serves as a record of services personally performed by Gery Pray, MD. It was created on his behalf by Roney Mans, a trained medical scribe. The creation of this record is based on the scribe's personal observations and the provider's statements to them. This document has been checked and approved by the attending provider.

## 2022-03-17 NOTE — Telephone Encounter (Signed)
Daughter called to report patient having loose stool for the last 2 days. Per daughter patient has taken Pepto bismol, Imodium and kaopectate and patient has had no relief. Patient also  had nausea and vomiting that has subside since yesterday.  Patient has poor appetite so she has be drinking pedialyte. Patient does not want to cancel HDR treatment on 2/7. Daughter asking if anything could be called in to help with loose stoos? Please advise.

## 2022-03-17 NOTE — Telephone Encounter (Signed)
CALLED PATIENT'S DAUGHTER- ROBBIE Pasko TO REMIND HER OF HER MOM'S HDR [place]. FOR 03-18-22 @ 2 PM, SPOKE WITH PATIENT'S DAUGHTER- [place] AND SHE IS AWARE OF THIS [place].

## 2022-03-18 ENCOUNTER — Ambulatory Visit
Admission: RE | Admit: 2022-03-18 | Discharge: 2022-03-18 | Disposition: A | Discharge status: Home / Self Care | Payer: Medicare Other | Source: Ambulatory Visit | Attending: Radiation Oncology | Admitting: Radiation Oncology

## 2022-03-18 ENCOUNTER — Telehealth: Payer: Self-pay

## 2022-03-18 ENCOUNTER — Encounter (HOSPITAL_COMMUNITY): Payer: Self-pay

## 2022-03-18 DIAGNOSIS — E878 Other disorders of electrolyte and fluid balance, not elsewhere classified: Secondary | ICD-10-CM | POA: Insufficient documentation

## 2022-03-18 NOTE — Telephone Encounter (Signed)
Office received Telephone Advise record from triage. Time: 7:38am Caller stated she can not get her mom to move or walk. Mom is awake  I spoke to Pt's daughter, Heath Lark, she states her mom was transported to [facility] has either had a seizure or stroke. The ER is getting her stabilized and then she will be transported to a different hospital (I didn't get the name).   Heath Lark states pt has an appointment today with [facility] at 2:00. I told her I would handle getting that appointment canceled. Robbie to call with an update as soon as her mom is stabilized.

## 2022-03-20 ENCOUNTER — Telehealth: Payer: Self-pay | Admitting: Hematology and Oncology

## 2022-03-20 ENCOUNTER — Encounter (HOSPITAL_COMMUNITY): Payer: Self-pay

## 2022-03-20 NOTE — Telephone Encounter (Signed)
This is to document conversation with Dr. Hessie Knows, attending physician to this patient at [facility].  I spoke with Dr. Hessie Knows and independently reviewed electronic records from care everywhere.  The patient was admitted to the hospital approximately 2 days ago after presentation with pre- syncopal episode as well as pancytopenia.  CT imaging showed evidence of colitis.  The patient has severe electrolyte imbalance.  She is now being treated for C. difficile infection, received transfusion support and electrolyte replacement therapy  Overall, she is currently stable and her pancytopenia is mildly improving. Family members are requesting transfer of care to [facility] for management of neutropenic fever due to familiarity of her care and as the patient has been receiving chemotherapy, radiation therapy and had surgery here.  I recommend consideration for discussion with hospitalist on-call in regards to the request for transfer.  In my opinion, the patient is receiving all the appropriate care in the local hospital.  If the hospitalist on-call accepts the transfer, consulting oncologist will follow over the weekend.

## 2022-03-21 ENCOUNTER — Other Ambulatory Visit: Payer: Self-pay

## 2022-03-21 ENCOUNTER — Other Ambulatory Visit (HOSPITAL_COMMUNITY): Payer: Medicare Other

## 2022-03-21 ENCOUNTER — Inpatient Hospital Stay (HOSPITAL_COMMUNITY)
Admission: RE | Admit: 2022-03-21 | Discharge: 2022-04-08 | DRG: 371 | Disposition: A | Discharge status: Home Health | Payer: Medicare Other | Source: Other Acute Inpatient Hospital | Attending: Family Medicine | Admitting: Family Medicine

## 2022-03-21 DIAGNOSIS — K567 Ileus, unspecified: Secondary | ICD-10-CM | POA: Insufficient documentation

## 2022-03-21 DIAGNOSIS — T451X5A Adverse effect of antineoplastic and immunosuppressive drugs, initial encounter: Secondary | ICD-10-CM | POA: Diagnosis present

## 2022-03-21 DIAGNOSIS — A0472 Enterocolitis due to Clostridium difficile, not specified as recurrent: Principal | ICD-10-CM | POA: Diagnosis present

## 2022-03-21 DIAGNOSIS — R7303 Prediabetes: Secondary | ICD-10-CM | POA: Diagnosis present

## 2022-03-21 DIAGNOSIS — E876 Hypokalemia: Secondary | ICD-10-CM | POA: Insufficient documentation

## 2022-03-21 DIAGNOSIS — L8990 Pressure ulcer of unspecified site, unspecified stage: Secondary | ICD-10-CM | POA: Diagnosis present

## 2022-03-21 DIAGNOSIS — D509 Iron deficiency anemia, unspecified: Secondary | ICD-10-CM | POA: Diagnosis present

## 2022-03-21 DIAGNOSIS — K560 Paralytic ileus: Secondary | ICD-10-CM | POA: Diagnosis not present

## 2022-03-21 DIAGNOSIS — I4891 Unspecified atrial fibrillation: Secondary | ICD-10-CM | POA: Diagnosis present

## 2022-03-21 DIAGNOSIS — Y792 Prosthetic and other implants, materials and accessory orthopedic devices associated with adverse incidents: Secondary | ICD-10-CM | POA: Diagnosis present

## 2022-03-21 DIAGNOSIS — Z95828 Presence of other vascular implants and grafts: Secondary | ICD-10-CM

## 2022-03-21 DIAGNOSIS — D61818 Other pancytopenia: Secondary | ICD-10-CM | POA: Diagnosis present

## 2022-03-21 DIAGNOSIS — L89153 Pressure ulcer of sacral region, stage 3: Secondary | ICD-10-CM | POA: Diagnosis present

## 2022-03-21 DIAGNOSIS — E7800 Pure hypercholesterolemia, unspecified: Secondary | ICD-10-CM | POA: Diagnosis present

## 2022-03-21 DIAGNOSIS — G620 Drug-induced polyneuropathy: Secondary | ICD-10-CM | POA: Diagnosis present

## 2022-03-21 DIAGNOSIS — Z90710 Acquired absence of both cervix and uterus: Secondary | ICD-10-CM

## 2022-03-21 DIAGNOSIS — C786 Secondary malignant neoplasm of retroperitoneum and peritoneum: Secondary | ICD-10-CM | POA: Diagnosis present

## 2022-03-21 DIAGNOSIS — I10 Essential (primary) hypertension: Secondary | ICD-10-CM | POA: Diagnosis present

## 2022-03-21 DIAGNOSIS — E669 Obesity, unspecified: Secondary | ICD-10-CM | POA: Diagnosis present

## 2022-03-21 DIAGNOSIS — E440 Moderate protein-calorie malnutrition: Secondary | ICD-10-CM | POA: Diagnosis present

## 2022-03-21 DIAGNOSIS — R188 Other ascites: Secondary | ICD-10-CM | POA: Diagnosis present

## 2022-03-21 DIAGNOSIS — Z923 Personal history of irradiation: Secondary | ICD-10-CM

## 2022-03-21 DIAGNOSIS — T84032A Mechanical loosening of internal right knee prosthetic joint, initial encounter: Secondary | ICD-10-CM | POA: Diagnosis present

## 2022-03-21 DIAGNOSIS — D61810 Antineoplastic chemotherapy induced pancytopenia: Secondary | ICD-10-CM | POA: Diagnosis present

## 2022-03-21 DIAGNOSIS — Z6830 Body mass index (BMI) 30.0-30.9, adult: Secondary | ICD-10-CM

## 2022-03-21 DIAGNOSIS — E43 Unspecified severe protein-calorie malnutrition: Secondary | ICD-10-CM | POA: Insufficient documentation

## 2022-03-21 DIAGNOSIS — Z96653 Presence of artificial knee joint, bilateral: Secondary | ICD-10-CM | POA: Diagnosis present

## 2022-03-21 DIAGNOSIS — Z803 Family history of malignant neoplasm of breast: Secondary | ICD-10-CM

## 2022-03-21 DIAGNOSIS — L89322 Pressure ulcer of left buttock, stage 2: Secondary | ICD-10-CM | POA: Diagnosis not present

## 2022-03-21 DIAGNOSIS — C55 Malignant neoplasm of uterus, part unspecified: Secondary | ICD-10-CM | POA: Diagnosis present

## 2022-03-21 DIAGNOSIS — Z7901 Long term (current) use of anticoagulants: Secondary | ICD-10-CM

## 2022-03-21 LAB — COMPREHENSIVE METABOLIC PANEL
ALT: 23 U/L (ref 0–44)
AST: 37 U/L (ref 15–41)
Albumin: 2.2 g/dL — ABNORMAL LOW (ref 3.5–5.0)
Alkaline Phosphatase: 62 U/L (ref 38–126)
Anion gap: 16 — ABNORMAL HIGH (ref 5–15)
BUN: 22 mg/dL (ref 8–23)
CO2: 22 mmol/L (ref 22–32)
Calcium: 7.9 mg/dL — ABNORMAL LOW (ref 8.9–10.3)
Chloride: 99 mmol/L (ref 98–111)
Creatinine, Ser: 0.84 mg/dL (ref 0.44–1.00)
GFR, Estimated: >60 mL/min (ref >60)
Glucose, Bld: 113 mg/dL — ABNORMAL HIGH (ref 70–99)
Potassium: 3.6 mmol/L (ref 3.5–5.1)
Sodium: 137 mmol/L (ref 135–145)
Total Bilirubin: 1.6 mg/dL — ABNORMAL HIGH (ref 0.3–1.2)
Total Protein: 6.2 g/dL — ABNORMAL LOW (ref 6.5–8.1)

## 2022-03-21 LAB — MRSA NEXT GEN BY PCR, NASAL: MRSA by PCR Next Gen: NOT DETECTED

## 2022-03-21 LAB — T4, FREE: Free T4: 1.25 ng/dL — ABNORMAL HIGH (ref 0.61–1.12)

## 2022-03-21 LAB — CBC
HCT: 29.8 % — ABNORMAL LOW (ref 36.0–46.0)
Hemoglobin: 9.4 g/dL — ABNORMAL LOW (ref 12.0–15.0)
MCH: 28.1 pg (ref 26.0–34.0)
MCHC: 31.5 g/dL (ref 30.0–36.0)
MCV: 89.2 fL (ref 80.0–100.0)
Platelets: 140 10*3/uL — ABNORMAL LOW (ref 150–400)
RBC: 3.34 MIL/uL — ABNORMAL LOW (ref 3.87–5.11)
RDW: 17.3 % — ABNORMAL HIGH (ref 11.5–15.5)
WBC: 7.7 10*3/uL (ref 4.0–10.5)
nRBC: 1.2 % — ABNORMAL HIGH (ref 0.0–0.2)

## 2022-03-21 LAB — GLUCOSE, CAPILLARY: Glucose-Capillary: 124 mg/dL — ABNORMAL HIGH (ref 70–99)

## 2022-03-21 LAB — MAGNESIUM: Magnesium: 2.7 mg/dL — ABNORMAL HIGH (ref 1.7–2.4)

## 2022-03-21 LAB — TSH: TSH: 2.015 u[IU]/mL (ref 0.350–4.500)

## 2022-03-21 LAB — PHOSPHORUS: Phosphorus: 3.3 mg/dL (ref 2.5–4.6)

## 2022-03-21 LAB — HEPARIN LEVEL (UNFRACTIONATED): Heparin Unfractionated: 0.75 IU/mL — ABNORMAL HIGH (ref 0.30–0.70)

## 2022-03-21 MED ORDER — LACTATED RINGERS IV BOLUS
500.0000 mL | Freq: Once | INTRAVENOUS | Status: AC
  Administered 2022-03-21: 500 mL via INTRAVENOUS

## 2022-03-21 MED ORDER — FIDAXOMICIN 200 MG PO TABS
200.0000 mg | ORAL_TABLET | Freq: Two times a day (BID) | ORAL | Status: AC
  Administered 2022-03-21 – 2022-03-30 (×20): 200 mg via ORAL
  Filled 2022-03-21 (×22): qty 1

## 2022-03-21 MED ORDER — METOPROLOL TARTRATE 5 MG/5ML IV SOLN
5.0000 mg | Freq: Once | INTRAVENOUS | Status: AC
  Administered 2022-03-21: 5 mg via INTRAVENOUS

## 2022-03-21 MED ORDER — METOPROLOL TARTRATE 25 MG PO TABS
12.5000 mg | ORAL_TABLET | Freq: Two times a day (BID) | ORAL | Status: DC
  Administered 2022-03-21 – 2022-04-08 (×35): 12.5 mg via ORAL
  Filled 2022-03-21 (×35): qty 1

## 2022-03-21 MED ORDER — HEPARIN BOLUS VIA INFUSION
4000.0000 [IU] | Freq: Once | INTRAVENOUS | Status: AC
  Administered 2022-03-21: 4000 [IU] via INTRAVENOUS
  Filled 2022-03-21: qty 4000

## 2022-03-21 MED ORDER — METOPROLOL TARTRATE 5 MG/5ML IV SOLN
5.0000 mg | INTRAVENOUS | Status: DC | PRN
  Filled 2022-03-21: qty 5

## 2022-03-21 MED ORDER — HEPARIN (PORCINE) 25000 UT/250ML-% IV SOLN
1150.0000 [IU]/h | INTRAVENOUS | Status: DC
  Administered 2022-03-21: 1150 [IU]/h via INTRAVENOUS
  Filled 2022-03-21: qty 250

## 2022-03-21 MED ORDER — METOPROLOL TARTRATE 5 MG/5ML IV SOLN
INTRAVENOUS | Status: AC
  Administered 2022-03-21: 5 mg
  Filled 2022-03-21: qty 5

## 2022-03-21 MED ORDER — HEPARIN (PORCINE) 25000 UT/250ML-% IV SOLN
1050.0000 [IU]/h | INTRAVENOUS | Status: DC
  Administered 2022-03-22: 1050 [IU]/h via INTRAVENOUS
  Filled 2022-03-21: qty 250

## 2022-03-21 MED ORDER — AMIODARONE HCL IN DEXTROSE 360-4.14 MG/200ML-% IV SOLN
60.0000 mg/h | INTRAVENOUS | Status: AC
  Administered 2022-03-21: 60 mg/h via INTRAVENOUS

## 2022-03-21 MED ORDER — PROCHLORPERAZINE EDISYLATE 10 MG/2ML IJ SOLN
5.0000 mg | INTRAMUSCULAR | Status: DC | PRN
  Administered 2022-03-21: 5 mg via INTRAVENOUS
  Filled 2022-03-21: qty 2

## 2022-03-21 MED ORDER — ACETAMINOPHEN 325 MG PO TABS
650.0000 mg | ORAL_TABLET | Freq: Four times a day (QID) | ORAL | Status: DC | PRN
  Administered 2022-03-23 – 2022-03-25 (×3): 650 mg via ORAL
  Filled 2022-03-21 (×4): qty 2

## 2022-03-21 MED ORDER — AMIODARONE LOAD VIA INFUSION
150.0000 mg | Freq: Once | INTRAVENOUS | Status: AC
  Administered 2022-03-21: 150 mg via INTRAVENOUS
  Filled 2022-03-21: qty 83.34

## 2022-03-21 MED ORDER — SODIUM CHLORIDE 0.9% FLUSH
10.0000 mL | Freq: Two times a day (BID) | INTRAVENOUS | Status: DC
  Administered 2022-03-21 – 2022-03-22 (×4): 10 mL
  Administered 2022-03-23: 30 mL
  Administered 2022-03-24 – 2022-03-31 (×9): 10 mL
  Administered 2022-04-01: 20 mL
  Administered 2022-04-01 – 2022-04-06 (×4): 10 mL

## 2022-03-21 MED ORDER — AMIODARONE HCL IN DEXTROSE 360-4.14 MG/200ML-% IV SOLN
30.0000 mg/h | INTRAVENOUS | Status: AC
  Administered 2022-03-21 – 2022-03-22 (×4): 30 mg/h via INTRAVENOUS
  Filled 2022-03-21 (×4): qty 200

## 2022-03-21 MED ORDER — PROCHLORPERAZINE EDISYLATE 10 MG/2ML IJ SOLN
10.0000 mg | Freq: Once | INTRAMUSCULAR | Status: AC
  Administered 2022-03-21: 10 mg via INTRAVENOUS
  Filled 2022-03-21: qty 2

## 2022-03-21 MED ORDER — METOPROLOL TARTRATE 5 MG/5ML IV SOLN
5.0000 mg | Freq: Once | INTRAVENOUS | Status: AC
  Administered 2022-03-21: 5 mg via INTRAVENOUS
  Filled 2022-03-21: qty 5

## 2022-03-21 MED ORDER — DILTIAZEM HCL-DEXTROSE 125-5 MG/125ML-% IV SOLN (PREMIX)
INTRAVENOUS | Status: AC
  Filled 2022-03-21: qty 125

## 2022-03-21 MED ORDER — AMIODARONE HCL 150 MG/3ML IV SOLN: 150.0000 mg | Freq: Once | INTRAVENOUS | Status: DC

## 2022-03-21 MED ORDER — SODIUM CHLORIDE 0.9% FLUSH
10.0000 mL | INTRAVENOUS | Status: DC | PRN
  Administered 2022-03-28: 10 mL

## 2022-03-21 MED ORDER — CHLORHEXIDINE GLUCONATE CLOTH 2 % EX PADS
6.0000 | MEDICATED_PAD | Freq: Every day | CUTANEOUS | Status: DC
  Administered 2022-03-21 – 2022-04-07 (×18): 6 via TOPICAL

## 2022-03-21 MED ORDER — AMIODARONE HCL IN DEXTROSE 360-4.14 MG/200ML-% IV SOLN
INTRAVENOUS | Status: AC
  Filled 2022-03-21: qty 200

## 2022-03-21 MED ORDER — ACETAMINOPHEN 650 MG RE SUPP: 650.0000 mg | Freq: Four times a day (QID) | RECTAL | Status: DC | PRN

## 2022-03-21 MED ORDER — POTASSIUM CHLORIDE CRYS ER 20 MEQ PO TBCR
40.0000 meq | EXTENDED_RELEASE_TABLET | Freq: Once | ORAL | Status: AC
  Administered 2022-03-21: 40 meq via ORAL
  Filled 2022-03-21: qty 2

## 2022-03-21 MED ORDER — MAGNESIUM SULFATE 2 GM/50ML IV SOLN
2.0000 g | Freq: Once | INTRAVENOUS | Status: DC
  Filled 2022-03-21: qty 50

## 2022-03-21 NOTE — Progress Notes (Signed)
  Transition of Care [facility]) Screening Note   Patient Details  Name: Pietra Zuluaga Date of Birth: Sep 15, 1950   Transition of Care [facility]) CM/SW Contact:    Henrietta Dine, RN Phone Number: 03/21/2022, 12:42 PM    Transition of Care Department [facility]) has reviewed patient and no TOC needs have been identified at this time. We will continue to monitor patient advancement through interdisciplinary progression rounds. If new patient transition needs arise, please place a TOC consult.

## 2022-03-21 NOTE — Progress Notes (Signed)
Patient received from [facility] on 78m Cardizem gtt. Nurse said they would call back with update on patient because they were starting on cardizem gtt. Charge RN let RN know that if multiple titrations had to be made that patient would not be appropriate for being on this floor. No update in call, patient just rolled onto unit. Admitting MD notified by page. Awaiting response from RR nurse and admitting MD.

## 2022-03-21 NOTE — H&P (Signed)
History and Physical    Patient: Sarah Wu [id number] DOB: [birth year] DOA: 03/21/2022 DOS: the patient was seen and examined on 03/21/2022 PCP: Levin Erp, DO  Patient coming from: Home  Chief Complaint: Diarrhea, electrolyte abnormalities and tachycardia  HPI: Sarah Wu is a 72 y.o. female with medical history significant of prediabetes, osteoarthritis, fibroid tumor, hyperlipidemia, hypertension, postmenopausal bleeding, peripheral neuropathy, stage IV uterine cancer with pancytopenia who was transferred to this facility from [facility] where she has been treated for the past 2 days due to C. difficile colitis, electrolyte abnormalities, sinus tachycardia with QT prolongation that has now converted into atrial fibrillation with RVR currently on amiodarone infusion.  No chest pain, palpitations, per hospital dyspneic.  She has occasional lower extremity edema.  Her abdominal pain is better than it was yesterday.  She is having less diarrhea today.  No flank pain, dysuria, frequency or hematuria.  Her most recent lab work shows a CBC with a white count 7.7, hemoglobin 9.4 g/dL platelets 140.  Magnesium 2.7 and phosphorus 3.3 g/dL.  CMP showed normal renal function and electrolytes after calcium correction.  Glucose 113 and total bilirubin 1.6 mg/dL.  Total protein 6.2 and albumin 2.2 g/dL.  Transaminases and alk phos were normal.  Imaging from 3 days ago: Portable 1 view chest radiograph with no focal infiltrates or any evidence of acute pulmonary disease.  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  Prominent space between the anterior distal femur endoprosthesis on the lateral view, not having them previously allowing for differences in technical factors.  Correlate clinically for mechanical loosening.  CT head without contrast with no evidence of acute intracranial process.  CT cervical spine with no evidence of acute fracture or subluxation.  CT  abdomen/pelvis done 2 days ago showing acute pancolitis.  There is a very small volume abdominal/pelvic ascites with CT findings suspicious for peritoneal carcinomatosis.  Suspicious abdominal retroperitoneal pericolic lymph nodes.  Cholelithiasis.  Patchy infiltrate or atelectasis right lower lobe and trace bilateral pleural effusions.  Please see images and full regular report for further details.   Review of Systems: As mentioned in the history of present illness. All other systems reviewed and are negative.  Past Medical History:  Diagnosis Date   Arthritis    Cancer ([place])    Fibroid tumor    Hx of mammogram 2022   [place]   Hypercholesteremia    Hypertension    Post-menopausal bleeding    Pre-diabetes    Past Surgical History:  Procedure Laterality Date   EXAMINATION UNDER ANESTHESIA  08/11/2021   with cervical mass biopsies   IR CHEST FLUORO  10/14/2021   IR IMAGING GUIDED PORT INSERTION  09/23/2021   IR IMAGING GUIDED PORT INSERTION  10/17/2021   LAPAROSCOPY N/A 09/10/2021   Procedure: DIAGNOSTIC LAPAROSCOPY;  Surgeon: Lafonda Mosses, MD;  Location: [facility];  Service: Gynecology;  Laterality: N/A;   REPLACEMENT TOTAL KNEE BILATERAL     TUBAL LIGATION     Social History:  reports that she has never smoked. She has never been exposed to tobacco smoke. She has never used smokeless tobacco. She reports that she does not drink alcohol and does not use drugs.  Allergies  Allergen Reactions   Hydrocodone-Acetaminophen Swelling    Swells stomach   Oxycodone-Acetaminophen Swelling    Swells stomach    Family History  Problem Relation Age of Onset   Breast cancer Sister        half-sister  Colon cancer Neg Hx    Ovarian cancer Neg Hx    Endometrial cancer Neg Hx    Pancreatic cancer Neg Hx    Prostate cancer Neg Hx     Prior to Admission medications   Medication Sig Start Date End Date Taking? Authorizing Provider  allopurinol (ZYLOPRIM) 300 MG tablet Take 300 mg by  mouth daily.    [provider]  amLODipine (NORVASC) 5 MG tablet Take 5 mg by mouth at bedtime.    [provider]  atenolol (TENORMIN) 50 MG tablet Take 50 mg by mouth daily.    [provider]  atorvastatin (LIPITOR) 10 MG tablet Take 10 mg by mouth at bedtime.    [provider]  diphenoxylate-atropine (LOMOTIL) 2.5-0.025 MG tablet Take 2 tablets by mouth 4 (four) times daily as needed for diarrhea or loose stools. 03/17/22   Gery Pray, MD  furosemide (LASIX) 20 MG tablet Take 1 tablet (20 mg total) by mouth daily. Patient not taking: Reported on 02/26/2022 11/17/21   Heath Lark, MD  lidocaine-prilocaine (EMLA) cream Apply to affected area once 09/17/21   Heath Lark, MD  Multiple Vitamins-Minerals (MULTIVITAMIN WITH MINERALS) tablet Take 1 tablet by mouth at bedtime. Centrum    [provider]  ondansetron (ZOFRAN) 8 MG tablet Take 1 tablet by mouth every 8 hours as needed for nausea or vomiting. Start on the third day after chemotherapy. 09/17/21   Heath Lark, MD  polyethylene glycol (MIRALAX / GLYCOLAX) 17 g packet Take 17 g by mouth daily.    [provider]  prochlorperazine (COMPAZINE) 10 MG tablet Take 1 tablet (10 mg total) by mouth every 6 (six) hours as needed for nausea or vomiting. 03/12/22   Heath Lark, MD    Physical Exam: Vitals:   03/21/22 0548 03/21/22 0600 03/21/22 0615 03/21/22 0630  BP: (!) 119/104 (!) 117/93 (!) 169/103 119/77  Pulse: (!) 161  83 (!) 131  Resp: (!) 37 (!) 37 (!) 42 (!) 40  Temp: (!) 97.3 F (36.3 C)     TempSrc: Oral     SpO2: 96%  98% 98%   Physical Exam Vitals and nursing note reviewed.  Constitutional:      Appearance: Normal appearance. She is ill-appearing.  HENT:     Head: Normocephalic.     Mouth/Throat:     Mouth: Mucous membranes are moist.  Eyes:     General: No scleral icterus.    Pupils: Pupils are equal, round, and reactive to light.  Cardiovascular:     Rate and Rhythm:  Tachycardia present. Rhythm irregular.  Pulmonary:     Effort: Tachypnea present. No accessory muscle usage or respiratory distress.     Breath sounds: No wheezing, rhonchi or rales.  Abdominal:     General: Bowel sounds are normal. There is no distension.     Palpations: Abdomen is soft.     Tenderness: There is no abdominal tenderness. There is no guarding.  Musculoskeletal:     Cervical back: Neck supple.     Right lower leg: No edema.     Left lower leg: No edema.  Skin:    General: Skin is warm and dry.  Neurological:     General: No focal deficit present.     Mental Status: She is alert and oriented to person, place, and time.  Psychiatric:        Mood and Affect: Mood normal.        Behavior: Behavior normal.  Data Reviewed:  Results are pending, will review when available.  Assessment and Plan: Principal Problem:   C. difficile colitis Stepdown/observation. Antiemetics as needed. Analgesics as needed. Continue with Dificid 200 mg p.o. twice daily for 10 days. Begin full liquid diet.  Follow-up renal function electrolytes.  Active Problems:   Atrial fibrillation with RVR (HCC)  Observation/PCU. Continue amiodarone infusion. Continue heparin infusion. Continue beta-blocker p.o. twice daily. Keep electrolytes optimized. Check echocardiogram. Unable to obtain echocardiogram earlier due to RVR in the 140s. Cardiology evaluation appreciated.    Essential hypertension Continue metoprolol as above. Hold amlodipine to avoid reflex tachycardia. Monitor blood pressure and heart rate.    Prediabetes On full liquid diet now. CBG monitoring before meals and bedtime.    Pure hypercholesterolemia Statin once clinically better.    Peripheral neuropathy due to chemotherapy ([place]) No significant symptoms at this time.    Pressure injury of skin Continue local care and preventive measures.     Advance Care Planning:   Code Status: Full Code   Consults:  Dorinda Hill DO (cardiology).  Family Communication: Her daughter was at bedside.  Severity of Illness: The appropriate patient status for this patient is INPATIENT. Inpatient status is judged to be reasonable and necessary in order to provide the required intensity of service to ensure the patient's safety. The patient's presenting symptoms, physical exam findings, and initial radiographic and laboratory data in the context of their chronic comorbidities is felt to place them at high risk for further clinical deterioration. Furthermore, it is not anticipated that the patient will be medically stable for discharge from the hospital within 2 midnights of admission.   * I certify that at the point of admission it is my clinical judgment that the patient will require inpatient hospital care spanning beyond 2 midnights from the point of admission due to high intensity of service, high risk for further deterioration and high frequency of surveillance required.*  Author: Reubin Milan, MD 03/21/2022 7:18 AM  For on call review [email or web address].   This document was prepared using Dragon voice recognition software and may contain some unintended transcription errors.

## 2022-03-21 NOTE — Progress Notes (Signed)
Patient transfer order placed by admitting MD. Report called to ICU RN and patient rolling down to ICU with this RN for uncontrolled HR, patient SOB on 4LNC. RR nurse still on rapid. Updated about patient transfer of care to ICU.

## 2022-03-21 NOTE — Progress Notes (Addendum)
ANTICOAGULATION CONSULT NOTE - Initial Consult  Pharmacy Consult for Heparin Indication: atrial fibrillation  Allergies  Allergen Reactions   Hydrocodone-Acetaminophen Swelling    Swells stomach   Oxycodone-Acetaminophen Swelling    Swells stomach    Patient Measurements: IBW: 64kg TWB 83.9kg Heparin Dosing Weight: 81 kg  Vital Signs: Temp: 97.3 F (36.3 C) (02/10 0548) Temp Source: Oral (02/10 0548) BP: 119/77 (02/10 0630) Pulse Rate: 131 (02/10 0630)  Labs: Recent Labs    03/21/22 0621  HGB 9.4*  HCT 29.8*  PLT 140*    Estimated Creatinine Clearance: 72.1 mL/min (by C-G formula based on SCr of 0.58 mg/dL).   Medical History: Past Medical History:  Diagnosis Date   Arthritis    Cancer ([place])    Fibroid tumor    Hx of mammogram 2022   [place]   Hypercholesteremia    Hypertension    Post-menopausal bleeding    Pre-diabetes     Assessment: Active Problem(s): Transfer from OSH on diltiazem infusion for afib  AC/Heme: In Afib. CHA2DS2-VASc score at least 3. Not on anticoag PTA. Start IV heparin.  Hgb 9.4. Plts 140 (baseline 200s-300s)  Goal of Therapy:  Heparin level 0.3-0.7 units/ml Monitor platelets by anticoagulation protocol: Yes   Plan:  Heparin 4000 unit bolus Heparin infusion at 1150 units/hr Check HL in 6-8 hrs. Daily HL and CBC   Sarah Wu, PharmD, BCPS Clinical Staff Pharmacist [email or web address] Alford Wu, Sarah Wu 03/21/2022,6:54 AM

## 2022-03-21 NOTE — Progress Notes (Signed)
Received a call from bedside RN regarding the patient being in A-fib with RVR with rate in the 160s to 170s with soft BPs.  Amiodarone drip ordered.  Presented at bedside.  The patient is alert and complaining of abdominal pain.  1 dose of IV Lopressor 5 mg x 1 administered with improvement of heart rate, however, still in RVR.  CHA2DS2-VASc score at least 3.  Will start heparin drip.  Discussed with cardiology Dr. Shellia Carwin who will see the patient in consultation.  Time: 15 minutes.

## 2022-03-21 NOTE — Progress Notes (Signed)
Patient received by transport on 26m Cardizem gtt, stated that previous facility gave several pushes of cardizem before transport to get patient stablized for tranport. Transport reported that patient has been uncontrolled HR of 130's to 160's the entire time during transport despite being maxed. No orders for cardizem on admission. Paging admitting provider. Cardizem gtt having to be turned off due to no active order from this facility.

## 2022-03-21 NOTE — Consult Note (Signed)
CARDIOLOGY CONSULT NOTE  Patient ID: Sarah Wu MRN: [id number] DOB/AGE: 1950-11-22 72 y.o.  Admit date: 03/21/2022 Referring Physician  Dr Olevia Bowens Primary Physician:  Levin Erp, DO Reason for Consultation  Afib RVR  Patient ID: Sarah Wu, female    DOB: [birth year], 72 y.o.   MRN: [id number]  No chief complaint on file.  HPI:    Sarah Wu  is a 72 y.o. female with past medical history significant for hypertension, hyperlipidemia, and cancer who presented to the hospital in new onset Afib RVR. Daughter is at bedside, tearful as mom is just very tired and has been sick for a long time now. Patient is somnolent and does not wake up when I enter the room. She is arousable to stimulation but she goes back to sleep very shortly after. Daughter states that patient does not feel it when she does into these fast heart rates.  Past Medical History:  Diagnosis Date   Arthritis    Cancer ([place])    Fibroid tumor    Hx of mammogram 2022   [place]   Hypercholesteremia    Hypertension    Post-menopausal bleeding    Pre-diabetes    Past Surgical History:  Procedure Laterality Date   EXAMINATION UNDER ANESTHESIA  08/11/2021   with cervical mass biopsies   IR CHEST FLUORO  10/14/2021   IR IMAGING GUIDED PORT INSERTION  09/23/2021   IR IMAGING GUIDED PORT INSERTION  10/17/2021   LAPAROSCOPY N/A 09/10/2021   Procedure: DIAGNOSTIC LAPAROSCOPY;  Surgeon: Lafonda Mosses, MD;  Location: [facility];  Service: Gynecology;  Laterality: N/A;   REPLACEMENT TOTAL KNEE BILATERAL     TUBAL LIGATION     Social History   Tobacco Use   Smoking status: Never    Passive exposure: Never   Smokeless tobacco: Never  Substance Use Topics   Alcohol use: Never    Family History  Problem Relation Age of Onset   Breast cancer Sister        half-sister   Colon cancer Neg Hx    Ovarian cancer Neg Hx    Endometrial cancer Neg Hx    Pancreatic cancer Neg Hx    Prostate cancer Neg Hx      Marital Status: Single  ROS  Review of Systems  Reason unable to perform ROS: patient is very somnolent.   Objective      03/21/2022    6:30 AM 03/21/2022    6:15 AM 03/21/2022    6:00 AM  Vitals with BMI  Systolic [id number]  Diastolic 77 [id number] 93  Pulse 131 83     Blood pressure 119/77, pulse (!) 131, temperature (!) 97.4 F (36.3 C), temperature source Oral, resp. rate (!) 40, SpO2 98 %.    Physical Exam Vitals reviewed.  Constitutional:      Appearance: She is ill-appearing.  HENT:     Head: Normocephalic and atraumatic.  Cardiovascular:     Rate and Rhythm: Tachycardia present. Rhythm irregular.     Pulses: Normal pulses.     Heart sounds: Normal heart sounds.  Pulmonary:     Effort: Pulmonary effort is normal.  Abdominal:     General: Bowel sounds are normal.  Musculoskeletal:     Right lower leg: No edema.     Left lower leg: No edema.  Skin:    General: Skin is warm and dry.    Laboratory examination:   Recent Labs    02/19/22 0841 03/12/22 [id number]  03/21/22 0621  NA 141 140 137  K 3.7 2.9* 3.6  CL 103 98 99  CO2 28 27 22  $ GLUCOSE 110* 111* 113*  BUN 10 10 22  $ CREATININE 0.53 0.58 0.84  CALCIUM 9.5 9.2 7.9*  GFRNONAA >60 >60 >60   estimated creatinine clearance is 68.7 mL/min (by C-G formula based on SCr of 0.84 mg/dL).     Latest Ref Rng & Units 03/21/2022    6:21 AM 03/12/2022    8:51 AM 02/19/2022    8:41 AM  CMP  Glucose 70 - 99 mg/dL 113  111  110   BUN 8 - 23 mg/dL 22  10  10   $ Creatinine 0.44 - 1.00 mg/dL 0.84  0.58  0.53   Sodium 135 - 145 mmol/L 137  140  141   Potassium 3.5 - 5.1 mmol/L 3.6  2.9  3.7   Chloride 98 - 111 mmol/L 99  98  103   CO2 22 - 32 mmol/L 22  27  28   $ Calcium 8.9 - 10.3 mg/dL 7.9  9.2  9.5   Total Protein 6.5 - 8.1 g/dL 6.2  7.1  7.1   Total Bilirubin 0.3 - 1.2 mg/dL 1.6  0.9  0.5   Alkaline Phos 38 - 126 U/L 62  54  47   AST 15 - 41 U/L 37  15  15   ALT 0 - 44 U/L 23  8  8       $ Latest Ref Rng & Units  03/21/2022    6:21 AM 03/12/2022    8:51 AM 02/19/2022    8:41 AM  CBC  WBC 4.0 - 10.5 K/uL 7.7  9.5  7.1   Hemoglobin 12.0 - 15.0 g/dL 9.4  10.4  10.6   Hematocrit 36.0 - 46.0 % 29.8  32.4  32.3   Platelets 150 - 400 K/uL 140  220  250    Lipid Panel No results for input(s): "CHOL", "TRIG", "[place]", "VLDL", "HDL", "CHOLHDL", "LDLDIRECT" in the last 8760 hours.  HEMOGLOBIN A1C Lab Results  Component Value Date   HGBA1C 5.4 12/30/2021   MPG 108.28 12/30/2021   TSH Recent Labs    11/06/21 0846 02/19/22 0841 03/12/22 0851  TSH 0.270* 0.532 0.274*   BNP (last 3 results) No results for input(s): "BNP" in the last 8760 hours. Cardiac Panel (last 3 results) No results for input(s): "CKTOTAL", "CKMB", "TROPONINIHS", "RELINDX" in the last 72 hours.   Medications and allergies   Allergies  Allergen Reactions   Hydrocodone-Acetaminophen Swelling    Swells stomach   Oxycodone-Acetaminophen Swelling    Swells stomach     Current Meds  Medication Sig   allopurinol (ZYLOPRIM) 300 MG tablet Take 300 mg by mouth daily.   amLODipine (NORVASC) 5 MG tablet Take 5 mg by mouth at bedtime.   atenolol (TENORMIN) 50 MG tablet Take 50 mg by mouth daily.   atorvastatin (LIPITOR) 10 MG tablet Take 10 mg by mouth at bedtime.   Biotin (BIOTIN 5000) 5 MG CAPS Take 5 mg by mouth daily.   diphenoxylate-atropine (LOMOTIL) 2.5-0.025 MG tablet Take 2 tablets by mouth 4 (four) times daily as needed for diarrhea or loose stools.   ferrous sulfate 325 (65 FE) MG tablet Take 325 mg by mouth daily with breakfast.   Lidocaine 3.5 % PTCH Apply 1 patch topically daily as needed (knee pain).   lidocaine-prilocaine (EMLA) cream Apply to affected area once   Multiple Vitamins-Minerals (MULTIVITAMIN WITH  MINERALS) tablet Take 1 tablet by mouth at bedtime. Centrum   ondansetron (ZOFRAN) 8 MG tablet Take 1 tablet by mouth every 8 hours as needed for nausea or vomiting. Start on the third day after chemotherapy.    OVER THE COUNTER MEDICATION Take 1 tablet by mouth daily. [facility] and brain support   polyethylene glycol (MIRALAX / GLYCOLAX) 17 g packet Take 17 g by mouth daily as needed for mild constipation.   prochlorperazine (COMPAZINE) 10 MG tablet Take 1 tablet (10 mg total) by mouth every 6 (six) hours as needed for nausea or vomiting.    Scheduled Meds:  Chlorhexidine Gluconate Cloth  6 each Topical Q0600   dilTIAZem HCl-Dextrose       fidaxomicin  200 mg Oral BID   metoprolol tartrate  5 mg Intravenous Once   metoprolol tartrate  12.5 mg Oral BID   potassium chloride  40 mEq Oral Once   prochlorperazine  10 mg Intravenous Once   sodium chloride flush  10-40 mL Intracatheter Q12H   Continuous Infusions:  amiodarone 60 mg/hr (03/21/22 0612)   amiodarone     heparin 1,150 Units/hr (03/21/22 0839)   lactated ringers     PRN Meds:.acetaminophen **OR** acetaminophen, dilTIAZem HCl-Dextrose, prochlorperazine, sodium chloride flush   No intake/output data recorded. No intake/output data recorded.  Net IO Since Admission: No IO data has been entered for this period [03/21/22 1004]   Radiology:   Imaging results have been reviewed and No results found.  Cardiac Studies:     EKG: 03/21/2022: Afib RVR with ST-T wave abnormality suspect due to LVH.  Assessment & Recommendations:   New onset Afib RVR, CHA2DS2VASc = 3 Patient currently on heparin gtt, transition to PO Eliquis once she is more awake. D/C on Eliquis. On amio gtt with RVR still, will give another bolus of 150 mg amio. Lopressor 5 mg IVP q49mn x3 doses for sustained RVR. When amio gtt almost complete, would start amio 400 mg PO BID while drip still running, to be taken for 1 week. Amio 200 mg BID for 1 week after that and amio 200 mg qDay thereafter. Patient has c.diff infection which is likely contributing to RVR. Echocardiogram ordered. Maintain on telemetry. Can dig load is remains RVR despite amio: dig 500  mcg x1 then 6 hours later 250 mcg, 6 hours later another 250 mcg, next day 125 mcg for maintenance. Optimize lytes, keep K>4 and Mag>2.    Low TSH, possible thyrotoxicosis  STAT thyroid panel ordered: TSH, FT4/T3. Potentially could be thyroid storm component as TSH in outpatient setting was extremely low. Discussed with primary.  Please call or text me directly with any questions or concerns [phone]    SFloydene Flock DO, FCrossroads Surgery Center Inc2/11/2022, 10:04 AM Office: [phone]

## 2022-03-21 NOTE — Progress Notes (Signed)
Pharmacy Brief Note - Evening Anticoagulation Follow Up:  Patient is a 12 yoF on heparin drip for atrial fibrillation. For full history, see note by Eilene Ghazi, PharmD from earlier today.   Heparin dosing weight = 81 kg  Assessment: Heparin level = 0.75 is slightly supratherapeutic on heparin infusion of 1150 units/hr. Confirmed with RN that lab was drawn on time ~1645 today (not at 1518 as indicated). Confirmed with RN - no line issues or signs of bleeding.   Goal: Heparin level 0.3 - 0.7  Plan: Decrease heparin infusion to 1050 units/hr Check HL 8 hours after rate change CBC, HL daily while on heparin Monitor for signs of bleeding  Lenis Noon, PharmD 03/21/22 6:15 PM

## 2022-03-22 ENCOUNTER — Inpatient Hospital Stay (HOSPITAL_COMMUNITY): Payer: Medicare Other

## 2022-03-22 ENCOUNTER — Observation Stay (HOSPITAL_COMMUNITY): Payer: Medicare Other

## 2022-03-22 ENCOUNTER — Other Ambulatory Visit: Payer: Self-pay

## 2022-03-22 ENCOUNTER — Observation Stay (HOSPITAL_BASED_OUTPATIENT_CLINIC_OR_DEPARTMENT_OTHER): Payer: Medicare Other

## 2022-03-22 DIAGNOSIS — G620 Drug-induced polyneuropathy: Secondary | ICD-10-CM | POA: Diagnosis not present

## 2022-03-22 DIAGNOSIS — E440 Moderate protein-calorie malnutrition: Secondary | ICD-10-CM | POA: Diagnosis present

## 2022-03-22 DIAGNOSIS — Y792 Prosthetic and other implants, materials and accessory orthopedic devices associated with adverse incidents: Secondary | ICD-10-CM | POA: Diagnosis present

## 2022-03-22 DIAGNOSIS — C786 Secondary malignant neoplasm of retroperitoneum and peritoneum: Secondary | ICD-10-CM | POA: Diagnosis present

## 2022-03-22 DIAGNOSIS — Z95828 Presence of other vascular implants and grafts: Secondary | ICD-10-CM | POA: Diagnosis not present

## 2022-03-22 DIAGNOSIS — E669 Obesity, unspecified: Secondary | ICD-10-CM | POA: Diagnosis present

## 2022-03-22 DIAGNOSIS — R188 Other ascites: Secondary | ICD-10-CM | POA: Diagnosis present

## 2022-03-22 DIAGNOSIS — I4891 Unspecified atrial fibrillation: Secondary | ICD-10-CM

## 2022-03-22 DIAGNOSIS — I10 Essential (primary) hypertension: Secondary | ICD-10-CM | POA: Diagnosis not present

## 2022-03-22 DIAGNOSIS — Z96653 Presence of artificial knee joint, bilateral: Secondary | ICD-10-CM | POA: Diagnosis present

## 2022-03-22 DIAGNOSIS — L89322 Pressure ulcer of left buttock, stage 2: Secondary | ICD-10-CM | POA: Diagnosis not present

## 2022-03-22 DIAGNOSIS — E7800 Pure hypercholesterolemia, unspecified: Secondary | ICD-10-CM | POA: Diagnosis present

## 2022-03-22 DIAGNOSIS — E876 Hypokalemia: Secondary | ICD-10-CM | POA: Diagnosis present

## 2022-03-22 DIAGNOSIS — L89153 Pressure ulcer of sacral region, stage 3: Secondary | ICD-10-CM | POA: Diagnosis present

## 2022-03-22 DIAGNOSIS — Z7901 Long term (current) use of anticoagulants: Secondary | ICD-10-CM | POA: Diagnosis not present

## 2022-03-22 DIAGNOSIS — K560 Paralytic ileus: Secondary | ICD-10-CM | POA: Diagnosis not present

## 2022-03-22 DIAGNOSIS — C55 Malignant neoplasm of uterus, part unspecified: Secondary | ICD-10-CM | POA: Diagnosis present

## 2022-03-22 DIAGNOSIS — D61810 Antineoplastic chemotherapy induced pancytopenia: Secondary | ICD-10-CM | POA: Diagnosis present

## 2022-03-22 DIAGNOSIS — R7303 Prediabetes: Secondary | ICD-10-CM | POA: Diagnosis present

## 2022-03-22 DIAGNOSIS — A0472 Enterocolitis due to Clostridium difficile, not specified as recurrent: Secondary | ICD-10-CM | POA: Diagnosis present

## 2022-03-22 DIAGNOSIS — K567 Ileus, unspecified: Secondary | ICD-10-CM | POA: Diagnosis not present

## 2022-03-22 DIAGNOSIS — T84032A Mechanical loosening of internal right knee prosthetic joint, initial encounter: Secondary | ICD-10-CM | POA: Diagnosis present

## 2022-03-22 DIAGNOSIS — T451X5A Adverse effect of antineoplastic and immunosuppressive drugs, initial encounter: Secondary | ICD-10-CM

## 2022-03-22 DIAGNOSIS — Z803 Family history of malignant neoplasm of breast: Secondary | ICD-10-CM | POA: Diagnosis not present

## 2022-03-22 DIAGNOSIS — D509 Iron deficiency anemia, unspecified: Secondary | ICD-10-CM | POA: Diagnosis present

## 2022-03-22 LAB — CBC
HCT: 28.3 % — ABNORMAL LOW (ref 36.0–46.0)
Hemoglobin: 9.1 g/dL — ABNORMAL LOW (ref 12.0–15.0)
MCH: 28.5 pg (ref 26.0–34.0)
MCHC: 32.2 g/dL (ref 30.0–36.0)
MCV: 88.7 fL (ref 80.0–100.0)
Platelets: 149 10*3/uL — ABNORMAL LOW (ref 150–400)
RBC: 3.19 MIL/uL — ABNORMAL LOW (ref 3.87–5.11)
RDW: 17.6 % — ABNORMAL HIGH (ref 11.5–15.5)
WBC: 10.2 10*3/uL (ref 4.0–10.5)
nRBC: 1.2 % — ABNORMAL HIGH (ref 0.0–0.2)

## 2022-03-22 LAB — COMPREHENSIVE METABOLIC PANEL
ALT: 22 U/L (ref 0–44)
AST: 29 U/L (ref 15–41)
Albumin: 2.1 g/dL — ABNORMAL LOW (ref 3.5–5.0)
Alkaline Phosphatase: 66 U/L (ref 38–126)
Anion gap: 16 — ABNORMAL HIGH (ref 5–15)
BUN: 24 mg/dL — ABNORMAL HIGH (ref 8–23)
CO2: 23 mmol/L (ref 22–32)
Calcium: 8 mg/dL — ABNORMAL LOW (ref 8.9–10.3)
Chloride: 96 mmol/L — ABNORMAL LOW (ref 98–111)
Creatinine, Ser: 1 mg/dL (ref 0.44–1.00)
GFR, Estimated: 60 mL/min — ABNORMAL LOW (ref >60)
Glucose, Bld: 123 mg/dL — ABNORMAL HIGH (ref 70–99)
Potassium: 3.4 mmol/L — ABNORMAL LOW (ref 3.5–5.1)
Sodium: 135 mmol/L (ref 135–145)
Total Bilirubin: 1.4 mg/dL — ABNORMAL HIGH (ref 0.3–1.2)
Total Protein: 6.1 g/dL — ABNORMAL LOW (ref 6.5–8.1)

## 2022-03-22 LAB — ECHOCARDIOGRAM COMPLETE
AR max vel: 2.8 cm2
AV Area VTI: 2.92 cm2
AV Area mean vel: 2.78 cm2
AV Mean grad: 3 mmHg
AV Peak grad: 4.9 mmHg
Ao pk vel: 1.11 m/s
Area-P 1/2: 3.46 cm2
S' Lateral: 2.1 cm

## 2022-03-22 LAB — GLUCOSE, CAPILLARY
Glucose-Capillary: 113 mg/dL — ABNORMAL HIGH (ref 70–99)
Glucose-Capillary: 118 mg/dL — ABNORMAL HIGH (ref 70–99)
Glucose-Capillary: 99 mg/dL (ref 70–99)
Glucose-Capillary: 114 mg/dL — ABNORMAL HIGH (ref 70–99)

## 2022-03-22 LAB — HEPARIN LEVEL (UNFRACTIONATED): Heparin Unfractionated: 0.59 IU/mL (ref 0.30–0.70)

## 2022-03-22 MED ORDER — IOHEXOL 300 MG/ML  SOLN
100.0000 mL | Freq: Once | INTRAMUSCULAR | Status: AC | PRN
  Administered 2022-03-22: 100 mL via INTRAVENOUS

## 2022-03-22 MED ORDER — FAMOTIDINE IN NACL 20-0.9 MG/50ML-% IV SOLN
20.0000 mg | INTRAVENOUS | Status: DC
  Administered 2022-03-22 – 2022-03-28 (×7): 20 mg via INTRAVENOUS
  Filled 2022-03-22 (×9): qty 50

## 2022-03-22 MED ORDER — ONDANSETRON HCL 4 MG/2ML IJ SOLN
4.0000 mg | Freq: Four times a day (QID) | INTRAMUSCULAR | Status: DC | PRN
  Administered 2022-03-22 – 2022-03-24 (×2): 4 mg via INTRAVENOUS
  Filled 2022-03-22 (×2): qty 2

## 2022-03-22 MED ORDER — KETOROLAC TROMETHAMINE 15 MG/ML IJ SOLN
15.0000 mg | Freq: Four times a day (QID) | INTRAMUSCULAR | Status: DC | PRN
  Administered 2022-03-22 – 2022-03-23 (×2): 15 mg via INTRAVENOUS
  Filled 2022-03-22 (×2): qty 1

## 2022-03-22 MED ORDER — LACTATED RINGERS IV SOLN: INTRAVENOUS | Status: DC

## 2022-03-22 MED ORDER — APIXABAN 5 MG PO TABS
5.0000 mg | ORAL_TABLET | Freq: Two times a day (BID) | ORAL | Status: DC
  Administered 2022-03-22 – 2022-04-08 (×34): 5 mg via ORAL
  Filled 2022-03-22 (×34): qty 1

## 2022-03-22 MED ORDER — IOHEXOL 9 MG/ML PO SOLN
500.0000 mL | ORAL | Status: AC
  Administered 2022-03-22 (×2): 500 mL via ORAL

## 2022-03-22 MED ORDER — HYDROXYZINE HCL 10 MG/5ML PO SYRP: 25.0000 mg | ORAL_SOLUTION | Freq: Four times a day (QID) | ORAL | Status: DC | PRN

## 2022-03-22 NOTE — Progress Notes (Signed)
ANTICOAGULATION CONSULT NOTE - follow up  Pharmacy Consult for Heparin Indication: atrial fibrillation  Allergies  Allergen Reactions   Hydrocodone-Acetaminophen Swelling    Swells stomach   Oxycodone-Acetaminophen Swelling    Swells stomach    Patient Measurements: IBW: 64kg TWB 83.9kg Heparin Dosing Weight: 81 kg  Vital Signs: Temp: 98.1 F (36.7 C) (02/11 0032) Temp Source: Oral (02/11 0032) BP: 127/82 (02/11 0200) Pulse Rate: 100 (02/11 0200)  Labs: Recent Labs    03/21/22 0621 03/21/22 1518 03/22/22 0235  HGB 9.4*  --  9.1*  HCT 29.8*  --  28.3*  PLT 140*  --  149*  HEPARINUNFRC  --  0.75* 0.59  CREATININE 0.84  --  1.00     Estimated Creatinine Clearance: 57.7 mL/min (by C-G formula based on SCr of 1 mg/dL).   Medical History: Past Medical History:  Diagnosis Date   Arthritis    Cancer ([place])    Fibroid tumor    Hx of mammogram 2022   [place]   Hypercholesteremia    Hypertension    Post-menopausal bleeding    Pre-diabetes     Assessment: Active Problem(s): Transfer from OSH on diltiazem infusion for afib  AC/Heme: In Afib. CHA2DS2-VASc score at least 3. Not on anticoag PTA. Start IV heparin.  Hgb 9.4. Plts 140 (baseline 200s-300s)  03/22/2022 HL 0.59 therapeutic on 1050 units/hr Hgb 9.1, plts 149 No bleeding noted  Goal of Therapy:  Heparin level 0.3-0.7 units/ml Monitor platelets by anticoagulation protocol: Yes   Plan:  Continue heparin drip at 1050 units/hr Confirmatory level in 8 hours Daily HL and CBC   Dolly Rias RPh 03/22/2022, 3:23 AM

## 2022-03-22 NOTE — Progress Notes (Signed)
[facility]                                                                               Sarah Wu, is a 72 y.o. female, DOB - 02/26/1950, [id number] Admit date - 03/21/2022    Outpatient Primary MD for the patient is [name], [name], DO  LOS - 1  days    Brief summary    Sarah Wu is a 72 y.o. female with medical history significant of prediabetes, osteoarthritis, fibroid tumor, hyperlipidemia, hypertension, postmenopausal bleeding, peripheral neuropathy, stage IV uterine cancer with pancytopenia who was transferred to this facility from [facility] where she has been treated for the past 2 days due to C. difficile colitis, electrolyte abnormalities, sinus tachycardia with QT prolongation that has now converted into atrial fibrillation with RVR . Cardiology consulted and patient started on IV amiodarone and IV heparin.    Assessment & Plan    Assessment and Plan:  Pancolitis secondary to C. difficile infection Patient was started on Dificid 200 mg twice daily KUB this morning showed distended bowels with gas in the small bowel and colon, possibly from ileus. Keep potassium greater than 4 and magnesium greater than 2 Monitor electrolytes and pain control Symptomatic management with IV antiemetics and IV fluids   Atrial fibrillation with RVR TSH wnl , free t4 is 1.25.  - rate control with IV amiodarone and oral metoprolol.  - on IV heparin for anticoagulation.   - cardiology on board and echocardiogram ordered.  Converted to sinus tachy.  - possible transition to oral amiodarone today.     Hypokalemia  Replaced, check magnesium levels later today.     Normocytic anemia.  Hemoglobin stable around 9.     Mild thrombocytopenia:  - continue to monitor. No bleeding evident.   Stage 4 Uterine cancer:  S/p robotic assisted laparoscopic total hysterectomy with bilateral salpingo-oophorectomy,. S/p reduced dose of  chemotherapy completed 6 cycles.  Follows up with Dr. Alvy Bimler    Peripheral neuropathy from chemotherapy    Acquired Pancytopenia:  Transfuse to keep hemoglobin greater than 7.      RN Pressure Injury Documentation: Pressure Injury 03/21/22 Sacrum Mid Stage 2 -  Partial thickness loss of dermis presenting as a shallow open injury with a red, pink wound bed without slough. Red (Active)  03/21/22 0800  Location: Sacrum  Location Orientation: Mid  Staging: Stage 2 -  Partial thickness loss of dermis presenting as a shallow open injury with a red, pink wound bed without slough.  Wound Description (Comments): Red  Present on Admission: Yes  Dressing Type Foam - Lift dressing to assess site every shift 03/21/22 2000   Foam dressing and local wound care.    Estimated body mass index is 28.13 kg/m as calculated from the following:   Height as of 03/12/22: 5' 8"$  (1.727 m).   Weight as of 03/12/22: 83.9 kg.  Code Status: FULL CODE.  DVT Prophylaxis:  IV HEPARIN.    Level of Care: Level of care: Stepdown Family Communication: Updated patient's Daughter at bedside.   Disposition Plan:     Remains inpatient appropriate:  IV amiodarone and IV heparin.  Procedures:  Echocardiogram  KUB  Consultants:   Cardiology.   Antimicrobials:   Anti-infectives (From admission, onward)    Start     Dose/Rate Route Frequency Ordered Stop   03/21/22 1000  fidaxomicin (DIFICID) tablet 200 mg        200 mg Oral 2 times daily 03/21/22 0654 03/31/22 0959        Medications  Scheduled Meds:  Chlorhexidine Gluconate Cloth  6 each Topical Q0600   fidaxomicin  200 mg Oral BID   metoprolol tartrate  12.5 mg Oral BID   sodium chloride flush  10-40 mL Intracatheter Q12H   Continuous Infusions:  amiodarone 30 mg/hr (03/22/22 1013)   heparin 1,050 Units/hr (03/22/22 0750)   magnesium sulfate bolus IVPB     PRN Meds:.acetaminophen **OR** acetaminophen, hydrOXYzine, metoprolol tartrate,  prochlorperazine, sodium chloride flush    Subjective:   Sarah Wu was seen and examined today.  Not feeling good. 2 BM this morning. Watery.  Daughter at bedside. She reports weak.   Objective:   Vitals:   03/22/22 0500 03/22/22 0600 03/22/22 0700 03/22/22 0900  BP: 115/74 120/66 119/79   Pulse: 100 99 (!) 102   Resp:   (!) 41   Temp:    98.3 F (36.8 C)  TempSrc:    Oral  SpO2: 93% 93% 93%     Intake/Output Summary (Last 24 hours) at 03/22/2022 1109 Last data filed at 03/22/2022 0750 Gross per 24 hour  Intake 811.76 ml  Output 300 ml  Net 511.76 ml   There were no vitals filed for this visit.   Exam General exam: ill appearing elderly lady , not in distress.  Respiratory system: diminished air entry at bases. On [place] oxygen.  Cardiovascular system: S1 & S2 heard, tachycardic. Marland Kitchen No JVD, murmurs, Gastrointestinal system: Abdomen is soft, mildly distended, mildly tender, bowel sounds hyperactive.  Central nervous system: Alert and oriented to person and place only.  Extremities: Symmetric 5 x 5 power. Skin: No rashes,  Psychiatry:  Mood & affect appropriate.     Data Reviewed:  I have personally reviewed following labs and imaging studies   CBC Lab Results  Component Value Date   WBC 10.2 03/22/2022   RBC 3.19 (L) 03/22/2022   HGB 9.1 (L) 03/22/2022   HCT 28.3 (L) 03/22/2022   MCV 88.7 03/22/2022   MCH 28.5 03/22/2022   PLT 149 (L) 03/22/2022   MCHC 32.2 03/22/2022   RDW 17.6 (H) 03/22/2022   LYMPHSABS 0.7 03/12/2022   MONOABS 0.1 03/12/2022   EOSABS 0.0 03/12/2022   BASOSABS 0.0 123456     Last metabolic panel Lab Results  Component Value Date   NA 135 03/22/2022   K 3.4 (L) 03/22/2022   CL 96 (L) 03/22/2022   CO2 23 03/22/2022   BUN 24 (H) 03/22/2022   CREATININE 1.00 03/22/2022   GLUCOSE 123 (H) 03/22/2022   GFRNONAA 60 (L) 03/22/2022   CALCIUM 8.0 (L) 03/22/2022   PHOS 3.3 03/21/2022   PROT 6.1 (L) 03/22/2022   ALBUMIN 2.1  (L) 03/22/2022   BILITOT 1.4 (H) 03/22/2022   ALKPHOS 66 03/22/2022   AST 29 03/22/2022   ALT 22 03/22/2022   ANIONGAP 16 (H) 03/22/2022    CBG (last 3)  Recent Labs    03/21/22 2216 03/22/22 0905  GLUCAP 124* 113*      Coagulation Profile: No results for input(s): "INR", "PROTIME" in the last 168 hours.   Radiology Studies: DG Abd 1  View  Result Date: 03/22/2022 CLINICAL DATA:  Abdominal distension.  History of C diff. EXAM: ABDOMEN - 1 VIEW COMPARISON:  CT scan 11/21/2021 FINDINGS: Diffuse gaseous distension of small bowel and colon noted with gas visible in the descending colon to the level of the sigmoid segment. Mild gaseous distention of the stomach is associated. IMPRESSION: Diffuse gaseous distension of small bowel and colon. Imaging features more suggestive of ileus than obstruction at this time. Electronically Signed   By: Misty Stanley M.D.   On: 03/22/2022 10:29   ECHOCARDIOGRAM COMPLETE  Result Date: 03/22/2022    ECHOCARDIOGRAM REPORT   Patient Name:   Sarah Wu Date of Exam: 03/22/2022 Medical Rec #:  [id number]          Height:       68.0 in Accession #:    [id number]         Weight:       185.0 lb Date of Birth:  December 28, 1950          BSA:          1.977 m Patient Age:    66 years           BP:           113/76 mmHg Patient Gender: F                  HR:           106 bpm. Exam Location:  Inpatient Procedure: 2D Echo, Cardiac Doppler and Color Doppler Indications:    I48.9 Atrial Fibrillation  History:        Patient has no prior history of Echocardiogram examinations.                 C-Diff; uterine cancer; Risk Factors:Hypertension, Dyslipidemia                 and Non-Smoker.  Sonographer:    Wilkie Aye RVT RCS Referring Phys: [id number] [place]  Sonographer Comments: No subcostal window, Technically challenging study due to limited acoustic windows, suboptimal parasternal window and suboptimal apical window. Image acquisition challenging due to respiratory  motion and exam done with patient in supine position. IMPRESSIONS  1. Left ventricular ejection fraction, by estimation, is 65 to 70%. The left ventricle has normal function. The left ventricle has no regional wall motion abnormalities. There is mild left ventricular hypertrophy. Left ventricular diastolic parameters are consistent with Grade I diastolic dysfunction (impaired relaxation).  2. Right ventricular systolic function is normal. The right ventricular size is normal. Tricuspid regurgitation signal is inadequate for assessing PA pressure.  3. The mitral valve is grossly normal. Trivial mitral valve regurgitation. No evidence of mitral stenosis.  4. The aortic valve is grossly normal. Aortic valve regurgitation is not visualized. No aortic stenosis is present. FINDINGS  Left Ventricle: Left ventricular ejection fraction, by estimation, is 65 to 70%. The left ventricle has normal function. The left ventricle has no regional wall motion abnormalities. The left ventricular internal cavity size was normal in size. There is  mild left ventricular hypertrophy. Left ventricular diastolic parameters are consistent with Grade I diastolic dysfunction (impaired relaxation). Right Ventricle: The right ventricular size is normal. No increase in right ventricular wall thickness. Right ventricular systolic function is normal. Tricuspid regurgitation signal is inadequate for assessing PA pressure. Left Atrium: Left atrial size was normal in size. Right Atrium: Right atrial size was normal in size. Pericardium: There is no evidence of pericardial effusion.  Mitral Valve: The mitral valve is grossly normal. Trivial mitral valve regurgitation. No evidence of mitral valve stenosis. Tricuspid Valve: The tricuspid valve is normal in structure. Tricuspid valve regurgitation is not demonstrated. No evidence of tricuspid stenosis. Aortic Valve: The aortic valve is grossly normal. Aortic valve regurgitation is not visualized. No aortic  stenosis is present. Aortic valve mean gradient measures 3.0 mmHg. Aortic valve peak gradient measures 4.9 mmHg. Aortic valve area, by VTI measures 2.92 cm. Pulmonic Valve: The pulmonic valve was normal in structure. Pulmonic valve regurgitation is trivial. No evidence of pulmonic stenosis. Aorta: The aortic root is normal in size and structure. Venous: The inferior vena cava was not well visualized. IAS/Shunts: The interatrial septum was not well visualized.  LEFT VENTRICLE PLAX 2D LVIDd:         2.90 cm   Diastology LVIDs:         2.10 cm   LV e' medial:    7.36 cm/s LV PW:         1.10 cm   LV E/e' medial:  7.2 LV IVS:        1.10 cm   LV e' lateral:   6.48 cm/s LVOT diam:     2.00 cm   LV E/e' lateral: 8.2 LV SV:         38 LV SV Index:   19 LVOT Area:     3.14 cm  RIGHT VENTRICLE RV S prime:     14.50 cm/s TAPSE (M-mode): 1.5 cm LEFT ATRIUM             Index LA diam:        3.10 cm 1.57 cm/m LA Vol (A2C):   18.5 ml 9.36 ml/m LA Vol (A4C):   25.0 ml 12.64 ml/m LA Biplane Vol: 22.5 ml 11.38 ml/m  AORTIC VALVE                    PULMONIC VALVE AV Area (Vmax):    2.80 cm     PV Vmax:       1.25 m/s AV Area (Vmean):   2.78 cm     PV Peak grad:  6.2 mmHg AV Area (VTI):     2.92 cm AV Vmax:           111.00 cm/s AV Vmean:          75.700 cm/s AV VTI:            0.130 m AV Peak Grad:      4.9 mmHg AV Mean Grad:      3.0 mmHg LVOT Vmax:         99.00 cm/s LVOT Vmean:        67.100 cm/s LVOT VTI:          0.121 m LVOT/AV VTI ratio: 0.93  AORTA Ao Root diam: 2.60 cm Ao Asc diam:  2.70 cm MITRAL VALVE MV Area (PHT): 3.46 cm    SHUNTS MV Decel Time: 219 msec    Systemic VTI:  0.12 m MV E velocity: 52.90 cm/s  Systemic Diam: 2.00 cm MV A velocity: 81.00 cm/s MV E/A ratio:  0.65 Cherlynn Kaiser MD Electronically signed by Cherlynn Kaiser MD Signature Date/Time: 03/22/2022/9:42:10 AM    Final        Hosie Poisson M.D. [facility] 03/22/2022, 11:09 AM  Available via Epic secure chat 7am-7pm After 7 pm,  please refer to night coverage provider listed on amion.

## 2022-03-22 NOTE — Discharge Instructions (Signed)

## 2022-03-22 NOTE — Progress Notes (Signed)
ANTICOAGULATION CONSULT NOTE - follow up  Pharmacy Consult for Heparin>> apixaban Indication: atrial fibrillation  Allergies  Allergen Reactions   Hydrocodone-Acetaminophen Swelling    Swells stomach   Oxycodone-Acetaminophen Swelling    Swells stomach    Patient Measurements: IBW: 64kg TWB 83.9kg Heparin Dosing Weight: 81 kg  Vital Signs: Temp: 98.3 F (36.8 C) (02/11 0900) Temp Source: Oral (02/11 0900) BP: 119/79 (02/11 0700) Pulse Rate: 102 (02/11 0700)  Labs: Recent Labs    03/21/22 0621 03/21/22 1518 03/22/22 0235  HGB 9.4*  --  9.1*  HCT 29.8*  --  28.3*  PLT 140*  --  149*  HEPARINUNFRC  --  0.75* 0.59  CREATININE 0.84  --  1.00     Estimated Creatinine Clearance: 57.7 mL/min (by C-G formula based on SCr of 1 mg/dL).  Assessment: Active Problem(s): Transfer from OSH on diltiazem infusion for afib  AC/Heme: In Afib. CHA2DS2-VASc score at least 3. Not on anticoag PTA. Start IV heparin.  Hgb 9.4. Plts 140 (baseline 200s-300s)  03/22/2022 HL 0.59 therapeutic on 1050 units/hr Hgb 9.1, plts 149 No bleeding noted To transition to apixaban  Goal of Therapy:  Heparin level 0.3-0.7 units/ml Monitor platelets by anticoagulation protocol: Yes   Plan:  DC heparin drip Apixaban 5 mg po bid for Afib Will educate & provide 30 day card prior to discharge  Eudelia Bunch, Pharm.D Use secure chat for questions 03/22/2022 1:03 PM

## 2022-03-22 NOTE — Progress Notes (Signed)
Subjective:  Patient seen and examined at bedside, resting comfortably. No events overnight. She is more awake this morning. She denies chest pain, shortness of breath, palpitations. She does admit to having palpitations when she sits up though. Daughter at bedside, states patient is doing better today.  Intake/Output from previous day:  I/O last 3 completed shifts: In: 711.3 [I.V.:711.3] Out: 300 [Urine:300] Total I/O In: 100.5 [I.V.:100.5] Out: -  Net IO Since Admission: 511.76 mL [03/22/22 1055]  Blood pressure 119/79, pulse (!) 102, temperature 98.3 F (36.8 C), temperature source Oral, resp. rate (!) 41, SpO2 93 %. Physical Exam Constitutional:      Appearance: She is ill-appearing.  HENT:     Head: Normocephalic and atraumatic.  Cardiovascular:     Rate and Rhythm: Regular rhythm. Tachycardia present.     Heart sounds: Normal heart sounds.  Pulmonary:     Effort: Pulmonary effort is normal.  Musculoskeletal:     Right lower leg: No edema.     Left lower leg: No edema.  Skin:    General: Skin is warm and dry.  Neurological:     Mental Status: She is alert.     Lab Results: Lab Results  Component Value Date   NA 135 03/22/2022   K 3.4 (L) 03/22/2022   CO2 23 03/22/2022   GLUCOSE 123 (H) 03/22/2022   BUN 24 (H) 03/22/2022   CREATININE 1.00 03/22/2022   CALCIUM 8.0 (L) 03/22/2022   GFRNONAA 60 (L) 03/22/2022    BNP (last 3 results) No results for input(s): "BNP" in the last 8760 hours.  ProBNP (last 3 results) No results for input(s): "PROBNP" in the last 8760 hours.    Latest Ref Rng & Units 03/22/2022    2:35 AM 03/21/2022    6:21 AM 03/12/2022    8:51 AM  BMP  Glucose 70 - 99 mg/dL 123  113  111   BUN 8 - 23 mg/dL 24  22  10   $ Creatinine 0.44 - 1.00 mg/dL 1.00  0.84  0.58   Sodium 135 - 145 mmol/L 135  137  140   Potassium 3.5 - 5.1 mmol/L 3.4  3.6  2.9   Chloride 98 - 111 mmol/L 96  99  98   CO2 22 - 32 mmol/L 23  22  27   $ Calcium 8.9 - 10.3 mg/dL 8.0   7.9  9.2       Latest Ref Rng & Units 03/22/2022    2:35 AM 03/21/2022    6:21 AM 03/12/2022    8:51 AM  Hepatic Function  Total Protein 6.5 - 8.1 g/dL 6.1  6.2  7.1   Albumin 3.5 - 5.0 g/dL 2.1  2.2  3.6   AST 15 - 41 U/L 29  37  15   ALT 0 - 44 U/L 22  23  8   $ Alk Phosphatase 38 - 126 U/L 66  62  54   Total Bilirubin 0.3 - 1.2 mg/dL 1.4  1.6  0.9       Latest Ref Rng & Units 03/22/2022    2:35 AM 03/21/2022    6:21 AM 03/12/2022    8:51 AM  CBC  WBC 4.0 - 10.5 K/uL 10.2  7.7  9.5   Hemoglobin 12.0 - 15.0 g/dL 9.1  9.4  10.4   Hematocrit 36.0 - 46.0 % 28.3  29.8  32.4   Platelets 150 - 400 K/uL 149  140  220    Lipid  Panel  No results found for: "CHOL", "TRIG", "HDL", "CHOLHDL", "VLDL", "LDLCALC", "LDLDIRECT" Cardiac Panel (last 3 results) No results for input(s): "CKTOTAL", "CKMB", "TROPONINI", "RELINDX" in the last 72 hours.  HEMOGLOBIN A1C Lab Results  Component Value Date   HGBA1C 5.4 12/30/2021   MPG 108.28 12/30/2021   TSH Recent Labs    02/19/22 0841 03/12/22 0851 03/21/22 1518  TSH 0.532 0.274* 2.015   Imaging: Imaging results have been reviewed and DG Abd 1 View  Result Date: 03/22/2022 CLINICAL DATA:  Abdominal distension.  History of C diff. EXAM: ABDOMEN - 1 VIEW COMPARISON:  CT scan 11/21/2021 FINDINGS: Diffuse gaseous distension of small bowel and colon noted with gas visible in the descending colon to the level of the sigmoid segment. Mild gaseous distention of the stomach is associated. IMPRESSION: Diffuse gaseous distension of small bowel and colon. Imaging features more suggestive of ileus than obstruction at this time. Electronically Signed   By: Misty Stanley M.D.   On: 03/22/2022 10:29   ECHOCARDIOGRAM COMPLETE  Result Date: 03/22/2022    ECHOCARDIOGRAM REPORT   Patient Name:   Sarah Wu Date of Exam: 03/22/2022 Medical Rec #:  [id number]          Height:       68.0 in Accession #:    [id number]         Weight:       185.0 lb Date of Birth:   08-13-1950          BSA:          1.977 m Patient Age:    72 years           BP:           113/76 mmHg Patient Gender: F                  HR:           106 bpm. Exam Location:  Inpatient Procedure: 2D Echo, Cardiac Doppler and Color Doppler Indications:    I48.9 Atrial Fibrillation  History:        Patient has no prior history of Echocardiogram examinations.                 C-Diff; uterine cancer; Risk Factors:Hypertension, Dyslipidemia                 and Non-Smoker.  Sonographer:    Wilkie Aye RVT RCS Referring Phys: [id number] [place]  Sonographer Comments: No subcostal window, Technically challenging study due to limited acoustic windows, suboptimal parasternal window and suboptimal apical window. Image acquisition challenging due to respiratory motion and exam done with patient in supine position. IMPRESSIONS  1. Left ventricular ejection fraction, by estimation, is 65 to 70%. The left ventricle has normal function. The left ventricle has no regional wall motion abnormalities. There is mild left ventricular hypertrophy. Left ventricular diastolic parameters are consistent with Grade I diastolic dysfunction (impaired relaxation).  2. Right ventricular systolic function is normal. The right ventricular size is normal. Tricuspid regurgitation signal is inadequate for assessing PA pressure.  3. The mitral valve is grossly normal. Trivial mitral valve regurgitation. No evidence of mitral stenosis.  4. The aortic valve is grossly normal. Aortic valve regurgitation is not visualized. No aortic stenosis is present. FINDINGS  Left Ventricle: Left ventricular ejection fraction, by estimation, is 65 to 70%. The left ventricle has normal function. The left ventricle has no regional wall motion abnormalities. The left ventricular internal cavity  size was normal in size. There is  mild left ventricular hypertrophy. Left ventricular diastolic parameters are consistent with Grade I diastolic dysfunction (impaired  relaxation). Right Ventricle: The right ventricular size is normal. No increase in right ventricular wall thickness. Right ventricular systolic function is normal. Tricuspid regurgitation signal is inadequate for assessing PA pressure. Left Atrium: Left atrial size was normal in size. Right Atrium: Right atrial size was normal in size. Pericardium: There is no evidence of pericardial effusion. Mitral Valve: The mitral valve is grossly normal. Trivial mitral valve regurgitation. No evidence of mitral valve stenosis. Tricuspid Valve: The tricuspid valve is normal in structure. Tricuspid valve regurgitation is not demonstrated. No evidence of tricuspid stenosis. Aortic Valve: The aortic valve is grossly normal. Aortic valve regurgitation is not visualized. No aortic stenosis is present. Aortic valve mean gradient measures 3.0 mmHg. Aortic valve peak gradient measures 4.9 mmHg. Aortic valve area, by VTI measures 2.92 cm. Pulmonic Valve: The pulmonic valve was normal in structure. Pulmonic valve regurgitation is trivial. No evidence of pulmonic stenosis. Aorta: The aortic root is normal in size and structure. Venous: The inferior vena cava was not well visualized. IAS/Shunts: The interatrial septum was not well visualized.  LEFT VENTRICLE PLAX 2D LVIDd:         2.90 cm   Diastology LVIDs:         2.10 cm   LV e' medial:    7.36 cm/s LV PW:         1.10 cm   LV E/e' medial:  7.2 LV IVS:        1.10 cm   LV e' lateral:   6.48 cm/s LVOT diam:     2.00 cm   LV E/e' lateral: 8.2 LV SV:         38 LV SV Index:   19 LVOT Area:     3.14 cm  RIGHT VENTRICLE RV S prime:     14.50 cm/s TAPSE (M-mode): 1.5 cm LEFT ATRIUM             Index LA diam:        3.10 cm 1.57 cm/m LA Vol (A2C):   18.5 ml 9.36 ml/m LA Vol (A4C):   25.0 ml 12.64 ml/m LA Biplane Vol: 22.5 ml 11.38 ml/m  AORTIC VALVE                    PULMONIC VALVE AV Area (Vmax):    2.80 cm     PV Vmax:       1.25 m/s AV Area (Vmean):   2.78 cm     PV Peak grad:  6.2  mmHg AV Area (VTI):     2.92 cm AV Vmax:           111.00 cm/s AV Vmean:          75.700 cm/s AV VTI:            0.130 m AV Peak Grad:      4.9 mmHg AV Mean Grad:      3.0 mmHg LVOT Vmax:         99.00 cm/s LVOT Vmean:        67.100 cm/s LVOT VTI:          0.121 m LVOT/AV VTI ratio: 0.93  AORTA Ao Root diam: 2.60 cm Ao Asc diam:  2.70 cm MITRAL VALVE MV Area (PHT): 3.46 cm    SHUNTS MV Decel Time: 219 msec  Systemic VTI:  0.12 m MV E velocity: 52.90 cm/s  Systemic Diam: 2.00 cm MV A velocity: 81.00 cm/s MV E/A ratio:  0.65 Cherlynn Kaiser MD Electronically signed by Cherlynn Kaiser MD Signature Date/Time: 03/22/2022/9:42:10 AM    Final     Cardiac Studies:  EKG: 03/21/2022: Afib RVR with ST-T wave abnormality suspect due to LVH.  03/22/2022 telemetry: sinus tachycardia, rates low 100s  Recent Results (from the past 43800 hour(s))  ECHOCARDIOGRAM COMPLETE   Collection Time: 03/22/22  9:07 AM  Result Value   BP 119/79   S' Lateral 2.10   AR max vel 2.80   AV Area VTI 2.92   AV Mean grad 3.0   AV Peak grad 4.9   Ao pk vel 1.11   Area-P 1/2 3.46   AV Area mean vel 2.78   Est EF 65 - 70%   Narrative      ECHOCARDIOGRAM REPORT       Patient Name:   Sarah Wu Date of Exam: 03/22/2022 Medical Rec #:  [id number]          Height:       68.0 in Accession #:    [id number]         Weight:       185.0 lb Date of Birth:  12/12/1950          BSA:          1.977 m Patient Age:    6 years           BP:           113/76 mmHg Patient Gender: F                  HR:           106 bpm. Exam Location:  Inpatient  Procedure: 2D Echo, Cardiac Doppler and Color Doppler  Indications:    I48.9 Atrial Fibrillation   History:        Patient has no prior history of Echocardiogram examinations.                 C-Diff; uterine cancer; Risk Factors:Hypertension, Dyslipidemia                 and Non-Smoker.   Sonographer:    Wilkie Aye RVT RCS Referring Phys: [id number] [place]     Sonographer Comments: No subcostal window, Technically challenging study due to limited acoustic windows, suboptimal parasternal window and suboptimal apical window. Image acquisition challenging due to respiratory motion and exam done with patient in  supine position. IMPRESSIONS    1. Left ventricular ejection fraction, by estimation, is 65 to 70%. The left ventricle has normal function. The left ventricle has no regional wall motion abnormalities. There is mild left ventricular hypertrophy. Left ventricular diastolic parameters  are consistent with Grade I diastolic dysfunction (impaired relaxation).  2. Right ventricular systolic function is normal. The right ventricular size is normal. Tricuspid regurgitation signal is inadequate for assessing PA pressure.  3. The mitral valve is grossly normal. Trivial mitral valve regurgitation. No evidence of mitral stenosis.  4. The aortic valve is grossly normal. Aortic valve regurgitation is not visualized. No aortic stenosis is present.  FINDINGS  Left Ventricle: Left ventricular ejection fraction, by estimation, is 65 to 70%. The left ventricle has normal function. The left ventricle has no regional wall motion abnormalities. The left ventricular internal cavity size was normal in size. There is  mild left ventricular  hypertrophy. Left ventricular diastolic parameters are consistent with Grade I diastolic dysfunction (impaired relaxation).  Right Ventricle: The right ventricular size is normal. No increase in right ventricular wall thickness. Right ventricular systolic function is normal. Tricuspid regurgitation signal is inadequate for assessing PA pressure.  Left Atrium: Left atrial size was normal in size.  Right Atrium: Right atrial size was normal in size.  Pericardium: There is no evidence of pericardial effusion.  Mitral Valve: The mitral valve is grossly normal. Trivial mitral valve regurgitation. No evidence of mitral valve  stenosis.  Tricuspid Valve: The tricuspid valve is normal in structure. Tricuspid valve regurgitation is not demonstrated. No evidence of tricuspid stenosis.  Aortic Valve: The aortic valve is grossly normal. Aortic valve regurgitation is not visualized. No aortic stenosis is present. Aortic valve mean gradient measures 3.0 mmHg. Aortic valve peak gradient measures 4.9 mmHg. Aortic valve area, by VTI measures  2.92 cm.  Pulmonic Valve: The pulmonic valve was normal in structure. Pulmonic valve regurgitation is trivial. No evidence of pulmonic stenosis.  Aorta: The aortic root is normal in size and structure.  Venous: The inferior vena cava was not well visualized.  IAS/Shunts: The interatrial septum was not well visualized.    LEFT VENTRICLE PLAX 2D LVIDd:         2.90 cm   Diastology LVIDs:         2.10 cm   LV e' medial:    7.36 cm/s LV PW:         1.10 cm   LV E/e' medial:  7.2 LV IVS:        1.10 cm   LV e' lateral:   6.48 cm/s LVOT diam:     2.00 cm   LV E/e' lateral: 8.2 LV SV:         38 LV SV Index:   19 LVOT Area:     3.14 cm    RIGHT VENTRICLE RV S prime:     14.50 cm/s TAPSE (M-mode): 1.5 cm  LEFT ATRIUM             Index LA diam:        3.10 cm 1.57 cm/m LA Vol (A2C):   18.5 ml 9.36 ml/m LA Vol (A4C):   25.0 ml 12.64 ml/m LA Biplane Vol: 22.5 ml 11.38 ml/m  AORTIC VALVE                    PULMONIC VALVE AV Area (Vmax):    2.80 cm     PV Vmax:       1.25 m/s AV Area (Vmean):   2.78 cm     PV Peak grad:  6.2 mmHg AV Area (VTI):     2.92 cm AV Vmax:           111.00 cm/s AV Vmean:          75.700 cm/s AV VTI:            0.130 m AV Peak Grad:      4.9 mmHg AV Mean Grad:      3.0 mmHg LVOT Vmax:         99.00 cm/s LVOT Vmean:        67.100 cm/s LVOT VTI:          0.121 m LVOT/AV VTI ratio: 0.93   AORTA Ao Root diam: 2.60 cm Ao Asc diam:  2.70 cm  MITRAL VALVE MV Area (PHT): 3.46 cm    SHUNTS  MV Decel Time: 219 msec    Systemic VTI:  0.12 m MV  E velocity: 52.90 cm/s  Systemic Diam: 2.00 cm MV A velocity: 81.00 cm/s MV E/A ratio:  0.65  Cherlynn Kaiser MD Electronically signed by Cherlynn Kaiser MD Signature Date/Time: 03/22/2022/9:42:10 AM       Final     *Note: Due to a large number of results and/or encounters for the requested time period, some results have not been displayed. A complete set of results can be found in Results Review.    Scheduled Meds:  Chlorhexidine Gluconate Cloth  6 each Topical Q0600   fidaxomicin  200 mg Oral BID   metoprolol tartrate  12.5 mg Oral BID   sodium chloride flush  10-40 mL Intracatheter Q12H   Continuous Infusions:  amiodarone 30 mg/hr (03/22/22 1013)   heparin 1,050 Units/hr (03/22/22 0750)   magnesium sulfate bolus IVPB     PRN Meds:.acetaminophen **OR** acetaminophen, hydrOXYzine, metoprolol tartrate, prochlorperazine, sodium chloride flush  Assessment  Sarah Wu is a 72 y.o. female patient with new onset atrial fibrillation, c.diff infection, and cancer.  New onset Afib RVR, CHA2DS2VASc = 3 Will transition to PO Eliquis today. On amio gtt with RVR still, start PO amio before gtt runs out. When amio gtt almost complete, would start amio 400 mg PO BID while drip still running, to be taken for 1 week. Amio 200 mg BID for 1 week after that and amio 200 mg qDay thereafter. Lopressor 5 mg IVP q8mn x3 doses for sustained RVR. Patient has c.diff infection which is likely contributing to RVR. Maintain on telemetry. She has converted to sinus tachycardia this morning. Can dig load is remains RVR despite amio: dig 500 mcg x1 then 6 hours later 250 mcg, 6 hours later another 250 mcg, next day 125 mcg for maintenance. Echocardiogram reviewed, LVEF normal. Optimize lytes, keep K>4 and Mag>2.     Low TSH STAT thyroid panel ordered: TSH, FT4/T3. TSH now normalized, less likely this is thyroid related.   Please call or text me directly with any questions or concerns  [phone]    SFloydene Flock MD, FViera [facility]2/12/2022, 10:55 AM Office: [phone]: [phone]: [phone]

## 2022-03-23 ENCOUNTER — Other Ambulatory Visit (HOSPITAL_COMMUNITY): Payer: Self-pay

## 2022-03-23 ENCOUNTER — Encounter (HOSPITAL_COMMUNITY): Payer: Self-pay | Admitting: Internal Medicine

## 2022-03-23 ENCOUNTER — Encounter: Payer: Self-pay | Admitting: Hematology and Oncology

## 2022-03-23 ENCOUNTER — Other Ambulatory Visit: Payer: Self-pay

## 2022-03-23 DIAGNOSIS — I4891 Unspecified atrial fibrillation: Secondary | ICD-10-CM | POA: Diagnosis not present

## 2022-03-23 DIAGNOSIS — G620 Drug-induced polyneuropathy: Secondary | ICD-10-CM | POA: Diagnosis not present

## 2022-03-23 DIAGNOSIS — I10 Essential (primary) hypertension: Secondary | ICD-10-CM | POA: Diagnosis not present

## 2022-03-23 DIAGNOSIS — A0472 Enterocolitis due to Clostridium difficile, not specified as recurrent: Secondary | ICD-10-CM | POA: Diagnosis not present

## 2022-03-23 LAB — BASIC METABOLIC PANEL
Anion gap: 13 (ref 5–15)
BUN: 24 mg/dL — ABNORMAL HIGH (ref 8–23)
CO2: 23 mmol/L (ref 22–32)
Calcium: 7.9 mg/dL — ABNORMAL LOW (ref 8.9–10.3)
Chloride: 97 mmol/L — ABNORMAL LOW (ref 98–111)
Creatinine, Ser: 1.03 mg/dL — ABNORMAL HIGH (ref 0.44–1.00)
GFR, Estimated: 58 mL/min — ABNORMAL LOW (ref >60)
Glucose, Bld: 100 mg/dL — ABNORMAL HIGH (ref 70–99)
Potassium: 2.9 mmol/L — ABNORMAL LOW (ref 3.5–5.1)
Sodium: 133 mmol/L — ABNORMAL LOW (ref 135–145)

## 2022-03-23 LAB — CBC
HCT: 24.9 % — ABNORMAL LOW (ref 36.0–46.0)
Hemoglobin: 7.9 g/dL — ABNORMAL LOW (ref 12.0–15.0)
MCH: 28.3 pg (ref 26.0–34.0)
MCHC: 31.7 g/dL (ref 30.0–36.0)
MCV: 89.2 fL (ref 80.0–100.0)
Platelets: 117 10*3/uL — ABNORMAL LOW (ref 150–400)
RBC: 2.79 MIL/uL — ABNORMAL LOW (ref 3.87–5.11)
RDW: 17.6 % — ABNORMAL HIGH (ref 11.5–15.5)
WBC: 6.5 10*3/uL (ref 4.0–10.5)
nRBC: 2.2 % — ABNORMAL HIGH (ref 0.0–0.2)

## 2022-03-23 LAB — PHOSPHORUS: Phosphorus: 2.4 mg/dL — ABNORMAL LOW (ref 2.5–4.6)

## 2022-03-23 LAB — GLUCOSE, CAPILLARY
Glucose-Capillary: 100 mg/dL — ABNORMAL HIGH (ref 70–99)
Glucose-Capillary: 92 mg/dL (ref 70–99)
Glucose-Capillary: 93 mg/dL (ref 70–99)
Glucose-Capillary: 93 mg/dL (ref 70–99)

## 2022-03-23 LAB — MAGNESIUM
Magnesium: 2 mg/dL (ref 1.7–2.4)
Magnesium: 1.6 mg/dL — ABNORMAL LOW (ref 1.7–2.4)

## 2022-03-23 MED ORDER — AMIODARONE HCL 200 MG PO TABS
200.0000 mg | ORAL_TABLET | Freq: Two times a day (BID) | ORAL | Status: AC
  Administered 2022-03-30 – 2022-04-05 (×13): 200 mg via ORAL
  Filled 2022-03-23 (×13): qty 1

## 2022-03-23 MED ORDER — AMIODARONE HCL 200 MG PO TABS
200.0000 mg | ORAL_TABLET | Freq: Every day | ORAL | Status: DC
  Administered 2022-04-06 – 2022-04-08 (×3): 200 mg via ORAL
  Filled 2022-03-23 (×3): qty 1

## 2022-03-23 MED ORDER — POTASSIUM CHLORIDE 10 MEQ/100ML IV SOLN
10.0000 meq | INTRAVENOUS | Status: AC
  Administered 2022-03-23 (×3): 10 meq via INTRAVENOUS
  Filled 2022-03-23 (×4): qty 100

## 2022-03-23 MED ORDER — AMIODARONE HCL 200 MG PO TABS
400.0000 mg | ORAL_TABLET | Freq: Two times a day (BID) | ORAL | Status: AC
  Administered 2022-03-23 – 2022-03-29 (×14): 400 mg via ORAL
  Filled 2022-03-23 (×15): qty 2

## 2022-03-23 MED ORDER — POTASSIUM CHLORIDE 10 MEQ/100ML IV SOLN
10.0000 meq | INTRAVENOUS | Status: AC
  Administered 2022-03-23 (×2): 10 meq via INTRAVENOUS
  Filled 2022-03-23: qty 100

## 2022-03-23 MED ORDER — AMIODARONE HCL 200 MG PO TABS: 400.0000 mg | ORAL_TABLET | Freq: Two times a day (BID) | ORAL | Status: DC

## 2022-03-23 MED ORDER — POTASSIUM CHLORIDE 10 MEQ/100ML IV SOLN
10.0000 meq | INTRAVENOUS | Status: AC
  Administered 2022-03-23: 10 meq via INTRAVENOUS
  Filled 2022-03-23: qty 100

## 2022-03-23 MED ORDER — MAGNESIUM SULFATE 2 GM/50ML IV SOLN
2.0000 g | Freq: Once | INTRAVENOUS | Status: AC
  Administered 2022-03-23: 2 g via INTRAVENOUS
  Filled 2022-03-23: qty 50

## 2022-03-23 NOTE — Progress Notes (Signed)
Subjective:  Patient seen and examined at bedside, resting comfortably. No events overnight. She says she is feeling much better.  She denies chest pain, shortness of breath, palpitations.   Intake/Output from previous day:  I/O last 3 completed shifts: In: 853.4 [I.V.:804.2; IV Piggyback:49.2] Out: 300 [Urine:300] Total I/O In: 997.3 [I.V.:897.5; IV Piggyback:99.8] Out: 500 [Urine:500] Net IO Since Admission: 1,506.67 mL [03/23/22 1234]  Blood pressure 120/77, pulse (!) 103, temperature 98.9 F (37.2 C), temperature source Oral, resp. rate 16, SpO2 97 %. Physical Exam Constitutional:      Appearance: She is ill-appearing.  HENT:     Head: Normocephalic and atraumatic.  Cardiovascular:     Rate and Rhythm: Regular rhythm. Tachycardia present.     Heart sounds: Normal heart sounds.  Pulmonary:     Effort: Pulmonary effort is normal.  Musculoskeletal:     Right lower leg: No edema.     Left lower leg: No edema.  Skin:    General: Skin is warm and dry.  Neurological:     Mental Status: She is alert.     Lab Results: Lab Results  Component Value Date   NA 133 (L) 03/23/2022   K 2.9 (L) 03/23/2022   CO2 23 03/23/2022   GLUCOSE 100 (H) 03/23/2022   BUN 24 (H) 03/23/2022   CREATININE 1.03 (H) 03/23/2022   CALCIUM 7.9 (L) 03/23/2022   GFRNONAA 58 (L) 03/23/2022    BNP (last 3 results) No results for input(s): "BNP" in the last 8760 hours.  ProBNP (last 3 results) No results for input(s): "PROBNP" in the last 8760 hours.    Latest Ref Rng & Units 03/23/2022    6:00 AM 03/22/2022    2:35 AM 03/21/2022    6:21 AM  BMP  Glucose 70 - 99 mg/dL 100  123  113   BUN 8 - 23 mg/dL 24  24  22   $ Creatinine 0.44 - 1.00 mg/dL 1.03  1.00  0.84   Sodium 135 - 145 mmol/L 133  135  137   Potassium 3.5 - 5.1 mmol/L 2.9  3.4  3.6   Chloride 98 - 111 mmol/L 97  96  99   CO2 22 - 32 mmol/L 23  23  22   $ Calcium 8.9 - 10.3 mg/dL 7.9  8.0  7.9       Latest Ref Rng & Units 03/22/2022     2:35 AM 03/21/2022    6:21 AM 03/12/2022    8:51 AM  Hepatic Function  Total Protein 6.5 - 8.1 g/dL 6.1  6.2  7.1   Albumin 3.5 - 5.0 g/dL 2.1  2.2  3.6   AST 15 - 41 U/L 29  37  15   ALT 0 - 44 U/L 22  23  8   $ Alk Phosphatase 38 - 126 U/L 66  62  54   Total Bilirubin 0.3 - 1.2 mg/dL 1.4  1.6  0.9       Latest Ref Rng & Units 03/23/2022    6:00 AM 03/22/2022    2:35 AM 03/21/2022    6:21 AM  CBC  WBC 4.0 - 10.5 K/uL 6.5  10.2  7.7   Hemoglobin 12.0 - 15.0 g/dL 7.9  9.1  9.4   Hematocrit 36.0 - 46.0 % 24.9  28.3  29.8   Platelets 150 - 400 K/uL 117  149  140    Lipid Panel  No results found for: "CHOL", "TRIG", "HDL", "CHOLHDL", "VLDL", "LDLCALC", "  LDLDIRECT" Cardiac Panel (last 3 results) No results for input(s): "CKTOTAL", "CKMB", "TROPONINI", "RELINDX" in the last 72 hours.  HEMOGLOBIN A1C Lab Results  Component Value Date   HGBA1C 5.4 12/30/2021   MPG 108.28 12/30/2021   TSH Recent Labs    02/19/22 0841 03/12/22 0851 03/21/22 1518  TSH 0.532 0.274* 2.015    Imaging: Imaging results have been reviewed and CT ABDOMEN PELVIS W CONTRAST  Result Date: 03/22/2022 CLINICAL DATA:  Acute non localized abdominal pain. Prior uterine carcinoma. * Tracking Code: BO * EXAM: CT ABDOMEN AND PELVIS WITH CONTRAST TECHNIQUE: Multidetector CT imaging of the abdomen and pelvis was performed using the standard protocol following bolus administration of intravenous contrast. RADIATION DOSE REDUCTION: This exam was performed according to the departmental dose-optimization program which includes automated exposure control, adjustment of the mA and/or kV according to patient size and/or use of iterative reconstruction technique. CONTRAST:  177m OMNIPAQUE IOHEXOL 300 MG/ML  SOLN COMPARISON:  11/21/2021 FINDINGS: Lower Chest: New bilateral lower lobe atelectasis or consolidation and small bilateral pleural effusions. Hepatobiliary: No hepatic masses identified. Gallstones are seen, however there is no  evidence of cholecystitis or biliary dilatation. Pancreas:  No mass or inflammatory changes. Spleen: Within normal limits in size and appearance. Adrenals/Urinary Tract: No suspicious masses identified. No evidence of ureteral calculi or hydronephrosis. Stomach/Bowel: Diffuse small bowel and colonic dilatation is seen, consistent with ileus. Mild diffuse wall thickening is seen throughout the colon, consistent with diffuse colitis. No evidence of small bowel dilatation or wall thickening. Normal appendix visualized. No evidence of abscess or free fluid. Vascular/Lymphatic: No pathologically enlarged lymph nodes. No acute vascular findings. Reproductive: Prior hysterectomy noted. Adnexal regions are unremarkable in appearance. Other:  None. Musculoskeletal:  No suspicious bone lesions identified. IMPRESSION: Mild diffuse colitis. Adynamic ileus. No evidence of recurrent or metastatic carcinoma. Cholelithiasis. No radiographic evidence of cholecystitis. New bilateral lower lobe atelectasis or consolidation and small bilateral pleural effusions. Electronically Signed   By: JMarlaine HindM.D.   On: 03/22/2022 15:54   DG Abd 1 View  Result Date: 03/22/2022 CLINICAL DATA:  Abdominal distension.  History of C diff. EXAM: ABDOMEN - 1 VIEW COMPARISON:  CT scan 11/21/2021 FINDINGS: Diffuse gaseous distension of small bowel and colon noted with gas visible in the descending colon to the level of the sigmoid segment. Mild gaseous distention of the stomach is associated. IMPRESSION: Diffuse gaseous distension of small bowel and colon. Imaging features more suggestive of ileus than obstruction at this time. Electronically Signed   By: EMisty StanleyM.D.   On: 03/22/2022 10:29   ECHOCARDIOGRAM COMPLETE  Result Date: 03/22/2022    ECHOCARDIOGRAM REPORT   Patient Name:   Sarah STOERMERDate of Exam: 03/22/2022 Medical Rec #:  [id number]         Height:       68.0 in Accession #:    [id number]        Weight:       185.0 lb  Date of Birth:  2March 30, 1952         BSA:          1.977 m Patient Age:    89years           BP:           113/76 mmHg Patient Gender: F                  HR:  106 bpm. Exam Location:  Inpatient Procedure: 2D Echo, Cardiac Doppler and Color Doppler Indications:    I48.9 Atrial Fibrillation  History:        Patient has no prior history of Echocardiogram examinations.                 C-Diff; uterine cancer; Risk Factors:Hypertension, Dyslipidemia                 and Non-Smoker.  Sonographer:    Wilkie Aye RVT RCS Referring Phys: [id number] [place]  Sonographer Comments: No subcostal window, Technically challenging study due to limited acoustic windows, suboptimal parasternal window and suboptimal apical window. Image acquisition challenging due to respiratory motion and exam done with patient in supine position. IMPRESSIONS  1. Left ventricular ejection fraction, by estimation, is 65 to 70%. The left ventricle has normal function. The left ventricle has no regional wall motion abnormalities. There is mild left ventricular hypertrophy. Left ventricular diastolic parameters are consistent with Grade I diastolic dysfunction (impaired relaxation).  2. Right ventricular systolic function is normal. The right ventricular size is normal. Tricuspid regurgitation signal is inadequate for assessing PA pressure.  3. The mitral valve is grossly normal. Trivial mitral valve regurgitation. No evidence of mitral stenosis.  4. The aortic valve is grossly normal. Aortic valve regurgitation is not visualized. No aortic stenosis is present. FINDINGS  Left Ventricle: Left ventricular ejection fraction, by estimation, is 65 to 70%. The left ventricle has normal function. The left ventricle has no regional wall motion abnormalities. The left ventricular internal cavity size was normal in size. There is  mild left ventricular hypertrophy. Left ventricular diastolic parameters are consistent with Grade I diastolic dysfunction  (impaired relaxation). Right Ventricle: The right ventricular size is normal. No increase in right ventricular wall thickness. Right ventricular systolic function is normal. Tricuspid regurgitation signal is inadequate for assessing PA pressure. Left Atrium: Left atrial size was normal in size. Right Atrium: Right atrial size was normal in size. Pericardium: There is no evidence of pericardial effusion. Mitral Valve: The mitral valve is grossly normal. Trivial mitral valve regurgitation. No evidence of mitral valve stenosis. Tricuspid Valve: The tricuspid valve is normal in structure. Tricuspid valve regurgitation is not demonstrated. No evidence of tricuspid stenosis. Aortic Valve: The aortic valve is grossly normal. Aortic valve regurgitation is not visualized. No aortic stenosis is present. Aortic valve mean gradient measures 3.0 mmHg. Aortic valve peak gradient measures 4.9 mmHg. Aortic valve area, by VTI measures 2.92 cm. Pulmonic Valve: The pulmonic valve was normal in structure. Pulmonic valve regurgitation is trivial. No evidence of pulmonic stenosis. Aorta: The aortic root is normal in size and structure. Venous: The inferior vena cava was not well visualized. IAS/Shunts: The interatrial septum was not well visualized.  LEFT VENTRICLE PLAX 2D LVIDd:         2.90 cm   Diastology LVIDs:         2.10 cm   LV e' medial:    7.36 cm/s LV PW:         1.10 cm   LV E/e' medial:  7.2 LV IVS:        1.10 cm   LV e' lateral:   6.48 cm/s LVOT diam:     2.00 cm   LV E/e' lateral: 8.2 LV SV:         38 LV SV Index:   19 LVOT Area:     3.14 cm  RIGHT VENTRICLE RV S prime:  14.50 cm/s TAPSE (M-mode): 1.5 cm LEFT ATRIUM             Index LA diam:        3.10 cm 1.57 cm/m LA Vol (A2C):   18.5 ml 9.36 ml/m LA Vol (A4C):   25.0 ml 12.64 ml/m LA Biplane Vol: 22.5 ml 11.38 ml/m  AORTIC VALVE                    PULMONIC VALVE AV Area (Vmax):    2.80 cm     PV Vmax:       1.25 m/s AV Area (Vmean):   2.78 cm     PV Peak  grad:  6.2 mmHg AV Area (VTI):     2.92 cm AV Vmax:           111.00 cm/s AV Vmean:          75.700 cm/s AV VTI:            0.130 m AV Peak Grad:      4.9 mmHg AV Mean Grad:      3.0 mmHg LVOT Vmax:         99.00 cm/s LVOT Vmean:        67.100 cm/s LVOT VTI:          0.121 m LVOT/AV VTI ratio: 0.93  AORTA Ao Root diam: 2.60 cm Ao Asc diam:  2.70 cm MITRAL VALVE MV Area (PHT): 3.46 cm    SHUNTS MV Decel Time: 219 msec    Systemic VTI:  0.12 m MV E velocity: 52.90 cm/s  Systemic Diam: 2.00 cm MV A velocity: 81.00 cm/s MV E/A ratio:  0.65 Cherlynn Kaiser MD Electronically signed by Cherlynn Kaiser MD Signature Date/Time: 03/22/2022/9:42:10 AM    Final     Cardiac Studies:  EKG: 03/21/2022: Afib RVR with ST-T wave abnormality suspect due to LVH.  03/22/2022 telemetry: sinus tachycardia, rates low 100s  Recent Results (from the past 43800 hour(s))  ECHOCARDIOGRAM COMPLETE   Collection Time: 03/22/22  9:07 AM  Result Value   BP 119/79   S' Lateral 2.10   AR max vel 2.80   AV Area VTI 2.92   AV Mean grad 3.0   AV Peak grad 4.9   Ao pk vel 1.11   Area-P 1/2 3.46   AV Area mean vel 2.78   Est EF 65 - 70%   Narrative      ECHOCARDIOGRAM REPORT       Patient Name:   Sarah Wu Date of Exam: 03/22/2022 Medical Rec #:  [id number]          Height:       68.0 in Accession #:    [id number]         Weight:       185.0 lb Date of Birth:  06/22/1950          BSA:          1.977 m Patient Age:    39 years           BP:           113/76 mmHg Patient Gender: F                  HR:           106 bpm. Exam Location:  Inpatient  Procedure: 2D Echo, Cardiac Doppler and Color Doppler  Indications:    I48.9 Atrial Fibrillation   History:  Patient has no prior history of Echocardiogram examinations.                 C-Diff; uterine cancer; Risk Factors:Hypertension, Dyslipidemia                 and Non-Smoker.   Sonographer:    Wilkie Aye RVT RCS Referring Phys: [id number] [place]     Sonographer Comments: No subcostal window, Technically challenging study due to limited acoustic windows, suboptimal parasternal window and suboptimal apical window. Image acquisition challenging due to respiratory motion and exam done with patient in  supine position. IMPRESSIONS    1. Left ventricular ejection fraction, by estimation, is 65 to 70%. The left ventricle has normal function. The left ventricle has no regional wall motion abnormalities. There is mild left ventricular hypertrophy. Left ventricular diastolic parameters  are consistent with Grade I diastolic dysfunction (impaired relaxation).  2. Right ventricular systolic function is normal. The right ventricular size is normal. Tricuspid regurgitation signal is inadequate for assessing PA pressure.  3. The mitral valve is grossly normal. Trivial mitral valve regurgitation. No evidence of mitral stenosis.  4. The aortic valve is grossly normal. Aortic valve regurgitation is not visualized. No aortic stenosis is present.  FINDINGS  Left Ventricle: Left ventricular ejection fraction, by estimation, is 65 to 70%. The left ventricle has normal function. The left ventricle has no regional wall motion abnormalities. The left ventricular internal cavity size was normal in size. There is  mild left ventricular hypertrophy. Left ventricular diastolic parameters are consistent with Grade I diastolic dysfunction (impaired relaxation).  Right Ventricle: The right ventricular size is normal. No increase in right ventricular wall thickness. Right ventricular systolic function is normal. Tricuspid regurgitation signal is inadequate for assessing PA pressure.  Left Atrium: Left atrial size was normal in size.  Right Atrium: Right atrial size was normal in size.  Pericardium: There is no evidence of pericardial effusion.  Mitral Valve: The mitral valve is grossly normal. Trivial mitral valve regurgitation. No evidence of mitral valve  stenosis.  Tricuspid Valve: The tricuspid valve is normal in structure. Tricuspid valve regurgitation is not demonstrated. No evidence of tricuspid stenosis.  Aortic Valve: The aortic valve is grossly normal. Aortic valve regurgitation is not visualized. No aortic stenosis is present. Aortic valve mean gradient measures 3.0 mmHg. Aortic valve peak gradient measures 4.9 mmHg. Aortic valve area, by VTI measures  2.92 cm.  Pulmonic Valve: The pulmonic valve was normal in structure. Pulmonic valve regurgitation is trivial. No evidence of pulmonic stenosis.  Aorta: The aortic root is normal in size and structure.  Venous: The inferior vena cava was not well visualized.  IAS/Shunts: The interatrial septum was not well visualized.    LEFT VENTRICLE PLAX 2D LVIDd:         2.90 cm   Diastology LVIDs:         2.10 cm   LV e' medial:    7.36 cm/s LV PW:         1.10 cm   LV E/e' medial:  7.2 LV IVS:        1.10 cm   LV e' lateral:   6.48 cm/s LVOT diam:     2.00 cm   LV E/e' lateral: 8.2 LV SV:         38 LV SV Index:   19 LVOT Area:     3.14 cm    RIGHT VENTRICLE RV S prime:     14.50  cm/s TAPSE (M-mode): 1.5 cm  LEFT ATRIUM             Index LA diam:        3.10 cm 1.57 cm/m LA Vol (A2C):   18.5 ml 9.36 ml/m LA Vol (A4C):   25.0 ml 12.64 ml/m LA Biplane Vol: 22.5 ml 11.38 ml/m  AORTIC VALVE                    PULMONIC VALVE AV Area (Vmax):    2.80 cm     PV Vmax:       1.25 m/s AV Area (Vmean):   2.78 cm     PV Peak grad:  6.2 mmHg AV Area (VTI):     2.92 cm AV Vmax:           111.00 cm/s AV Vmean:          75.700 cm/s AV VTI:            0.130 m AV Peak Grad:      4.9 mmHg AV Mean Grad:      3.0 mmHg LVOT Vmax:         99.00 cm/s LVOT Vmean:        67.100 cm/s LVOT VTI:          0.121 m LVOT/AV VTI ratio: 0.93   AORTA Ao Root diam: 2.60 cm Ao Asc diam:  2.70 cm  MITRAL VALVE MV Area (PHT): 3.46 cm    SHUNTS MV Decel Time: 219 msec    Systemic VTI:  0.12 m MV  E velocity: 52.90 cm/s  Systemic Diam: 2.00 cm MV A velocity: 81.00 cm/s MV E/A ratio:  0.65  Cherlynn Kaiser MD Electronically signed by Cherlynn Kaiser MD Signature Date/Time: 03/22/2022/9:42:10 AM       Final     *Note: Due to a large number of results and/or encounters for the requested time period, some results have not been displayed. A complete set of results can be found in Results Review.    Scheduled Meds:  amiodarone  400 mg Oral BID   Followed by   Derrill Memo ON 03/30/2022] amiodarone  200 mg Oral BID   Followed by   Derrill Memo ON 04/06/2022] amiodarone  200 mg Oral Daily   apixaban  5 mg Oral BID   Chlorhexidine Gluconate Cloth  6 each Topical Q0600   fidaxomicin  200 mg Oral BID   metoprolol tartrate  12.5 mg Oral BID   sodium chloride flush  10-40 mL Intracatheter Q12H   Continuous Infusions:  famotidine (PEPCID) IV Stopped (03/22/22 1419)   lactated ringers Stopped (03/23/22 0927)   potassium chloride     potassium chloride 10 mEq (03/23/22 1229)   PRN Meds:.acetaminophen **OR** acetaminophen, hydrOXYzine, ketorolac, metoprolol tartrate, ondansetron (ZOFRAN) IV, prochlorperazine, sodium chloride flush  Assessment  Sarah Wu is a 72 y.o. female patient with new onset atrial fibrillation, c.diff infection, and cancer.  New onset Afib RVR, CHA2DS2VASc = 3 Continue PO Eliquis. Will let amio gtt run until completion, starting 400 mg PO BID today. Discussed with nurse and family. Dosing of amio: 400 mg PO BID to be taken for 1 week. Amio 200 mg BID for 1 week after that and amio 200 mg qDay thereafter. Lopressor 5 mg IVP q61mn x3 doses for sustained RVR. Patient has c.diff infection which is likely contributing to RVR. Maintain on telemetry. She remains in sinus tachycardia this morning. Can dig load if develops RVR with  activity: dig 500 mcg x1 then 6 hours later 250 mcg, 6 hours later another 250 mcg, next day 125 mcg for maintenance. Echocardiogram reviewed,  LVEF normal. Optimize lytes, keep K>4 and Mag>2.     Low TSH STAT thyroid panel ordered: TSH, FT4/T3. TSH now normalized, less likely this is thyroid related.   Please call or text me directly with any questions or concerns [phone].    Floydene Flock, DO, [facility] 03/23/2022, 12:34 PM Office: [phone] Fax: [phone] Pager: [phone]

## 2022-03-23 NOTE — Progress Notes (Signed)
Sarah Wu   DOB:[birth year]   [id number]    ASSESSMENT & PLAN:  Uterine cancer She has completed adjuvant treatment Her treatment will be placed on hold to allow recovery from her current hospitalization I have informed the GYN navigator to update radiation oncologist to also put her radiation therapy on hold We will focus on supportive care Her CT imaging show no evidence of disease  C. difficile colitis Continue medical management  Acquired pancytopenia Due to recent chemotherapy This is much improved Monitor closely, she does not need transfusion support right now  Electrolyte imbalance Due to colitis Replace as needed  Transient atrial fibrillation Due to her illness Agree with anticoagulation therapy Monitor closely for signs of bleeding  Discharge planning Anticipate she will be here for the next 3 to 5 days Will follow  All questions were answered. The patient knows to call the clinic with any problems, questions or concerns.   The total time spent in the appointment was 55 minutes encounter with patients including review of chart and various tests results, discussions about plan of care and coordination of care plan  Sarah Lark, MD 03/23/2022 9:22 AM  Subjective:  I was involved in her care last week Local physicians require transfer of care to [facility] She was admitted with presyncopal episode and subsequently found to have severe pancytopenia as well as colitis Since transfer, she had transient atrial fibrillation that converted into normal sinus rhythm She is receiving antibiotics, medications for atrial fibrillation and anticoagulation therapy She is doing okay this morning Denies pain or nausea Daughter by the bedside Multipleimaging studies were reviewed  Objective:  Vitals:   03/23/22 0400 03/23/22 0750  BP:    Pulse:    Resp:    Temp: 98.4 F (36.9 C) 98.7 F (37.1 C)  SpO2:       Intake/Output Summary (Last 24 hours) at  03/23/2022 [id number] Last data filed at 03/22/2022 1800 Gross per 24 hour  Intake 497.62 ml  Output --  Net 497.62 ml    GENERAL:alert, no distress and comfortable SKIN: skin color, texture, turgor are normal, no rashes or significant lesions EYES: normal, Conjunctiva are pink and non-injected, sclera clear OROPHARYNX:no exudate, no erythema and lips, buccal mucosa, and tongue normal  NECK: supple, thyroid normal size, non-tender, without nodularity LYMPH:  no palpable lymphadenopathy in the cervical, axillary or inguinal LUNGS: clear to auscultation and percussion with normal breathing effort HEART: regular rate & rhythm and no murmurs and no lower extremity edema ABDOMEN:abdomen soft, appears distended without tenderness on palpation  musculoskeletal:no cyanosis of digits and no clubbing  NEURO: alert & oriented x 3 with fluent speech, no focal motor/sensory deficits   Labs:  Recent Labs    03/12/22 0851 03/21/22 0621 03/22/22 0235 03/23/22 0600  NA 140 137 135 133*  K 2.9* 3.6 3.4* 2.9*  CL 98 99 96* 97*  CO2 27 22 23 23  $ GLUCOSE 111* 113* 123* 100*  BUN 10 22 24* 24*  CREATININE 0.58 0.84 1.00 1.03*  CALCIUM 9.2 7.9* 8.0* 7.9*  GFRNONAA >60 >60 60* 58*  PROT 7.1 6.2* 6.1*  --   ALBUMIN 3.6 2.2* 2.1*  --   AST 15 37 29  --   ALT 8 23 22  $ --   ALKPHOS 54 62 66  --   BILITOT 0.9 1.6* 1.4*  --     Studies: I have personally reviewed her CT imaging CT ABDOMEN PELVIS W CONTRAST  Result Date: 03/22/2022 CLINICAL  DATA:  Acute non localized abdominal pain. Prior uterine carcinoma. * Tracking Code: BO * EXAM: CT ABDOMEN AND PELVIS WITH CONTRAST TECHNIQUE: Multidetector CT imaging of the abdomen and pelvis was performed using the standard protocol following bolus administration of intravenous contrast. RADIATION DOSE REDUCTION: This exam was performed according to the departmental dose-optimization program which includes automated exposure control, adjustment of the mA and/or kV  according to patient size and/or use of iterative reconstruction technique. CONTRAST:  155m OMNIPAQUE IOHEXOL 300 MG/ML  SOLN COMPARISON:  11/21/2021 FINDINGS: Lower Chest: New bilateral lower lobe atelectasis or consolidation and small bilateral pleural effusions. Hepatobiliary: No hepatic masses identified. Gallstones are seen, however there is no evidence of cholecystitis or biliary dilatation. Pancreas:  No mass or inflammatory changes. Spleen: Within normal limits in size and appearance. Adrenals/Urinary Tract: No suspicious masses identified. No evidence of ureteral calculi or hydronephrosis. Stomach/Bowel: Diffuse small bowel and colonic dilatation is seen, consistent with ileus. Mild diffuse wall thickening is seen throughout the colon, consistent with diffuse colitis. No evidence of small bowel dilatation or wall thickening. Normal appendix visualized. No evidence of abscess or free fluid. Vascular/Lymphatic: No pathologically enlarged lymph nodes. No acute vascular findings. Reproductive: Prior hysterectomy noted. Adnexal regions are unremarkable in appearance. Other:  None. Musculoskeletal:  No suspicious bone lesions identified. IMPRESSION: Mild diffuse colitis. Adynamic ileus. No evidence of recurrent or metastatic carcinoma. Cholelithiasis. No radiographic evidence of cholecystitis. New bilateral lower lobe atelectasis or consolidation and small bilateral pleural effusions. Electronically Signed   By: JMarlaine HindM.D.   On: 03/22/2022 15:54   DG Abd 1 View  Result Date: 03/22/2022 CLINICAL DATA:  Abdominal distension.  History of C diff. EXAM: ABDOMEN - 1 VIEW COMPARISON:  CT scan 11/21/2021 FINDINGS: Diffuse gaseous distension of small bowel and colon noted with gas visible in the descending colon to the level of the sigmoid segment. Mild gaseous distention of the stomach is associated. IMPRESSION: Diffuse gaseous distension of small bowel and colon. Imaging features more suggestive of ileus  than obstruction at this time. Electronically Signed   By: EMisty StanleyM.D.   On: 03/22/2022 10:29   ECHOCARDIOGRAM COMPLETE  Result Date: 03/22/2022    ECHOCARDIOGRAM REPORT   Patient Name:   MGAIGE BRADSTREETDate of Exam: 03/22/2022 Medical Rec #:  [id number]         Height:       68.0 in Accession #:    [id number]        Weight:       185.0 lb Date of Birth:  [birth year]         BSA:          1.977 m Patient Age:    89years           BP:           113/76 mmHg Patient Gender: F                  HR:           106 bpm. Exam Location:  Inpatient Procedure: 2D Echo, Cardiac Doppler and Color Doppler Indications:    I48.9 Atrial Fibrillation  History:        Patient has no prior history of Echocardiogram examinations.                 C-Diff; uterine cancer; Risk Factors:Hypertension, Dyslipidemia  and Non-Smoker.  Sonographer:    Wilkie Aye RVT RCS Referring Phys: [id number] Nora  Sonographer Comments: No subcostal window, Technically challenging study due to limited acoustic windows, suboptimal parasternal window and suboptimal apical window. Image acquisition challenging due to respiratory motion and exam done with patient in supine position. IMPRESSIONS  1. Left ventricular ejection fraction, by estimation, is 65 to 70%. The left ventricle has normal function. The left ventricle has no regional wall motion abnormalities. There is mild left ventricular hypertrophy. Left ventricular diastolic parameters are consistent with Grade I diastolic dysfunction (impaired relaxation).  2. Right ventricular systolic function is normal. The right ventricular size is normal. Tricuspid regurgitation signal is inadequate for assessing PA pressure.  3. The mitral valve is grossly normal. Trivial mitral valve regurgitation. No evidence of mitral stenosis.  4. The aortic valve is grossly normal. Aortic valve regurgitation is not visualized. No aortic stenosis is present. FINDINGS  Left Ventricle: Left  ventricular ejection fraction, by estimation, is 65 to 70%. The left ventricle has normal function. The left ventricle has no regional wall motion abnormalities. The left ventricular internal cavity size was normal in size. There is  mild left ventricular hypertrophy. Left ventricular diastolic parameters are consistent with Grade I diastolic dysfunction (impaired relaxation). Right Ventricle: The right ventricular size is normal. No increase in right ventricular wall thickness. Right ventricular systolic function is normal. Tricuspid regurgitation signal is inadequate for assessing PA pressure. Left Atrium: Left atrial size was normal in size. Right Atrium: Right atrial size was normal in size. Pericardium: There is no evidence of pericardial effusion. Mitral Valve: The mitral valve is grossly normal. Trivial mitral valve regurgitation. No evidence of mitral valve stenosis. Tricuspid Valve: The tricuspid valve is normal in structure. Tricuspid valve regurgitation is not demonstrated. No evidence of tricuspid stenosis. Aortic Valve: The aortic valve is grossly normal. Aortic valve regurgitation is not visualized. No aortic stenosis is present. Aortic valve mean gradient measures 3.0 mmHg. Aortic valve peak gradient measures 4.9 mmHg. Aortic valve area, by VTI measures 2.92 cm. Pulmonic Valve: The pulmonic valve was normal in structure. Pulmonic valve regurgitation is trivial. No evidence of pulmonic stenosis. Aorta: The aortic root is normal in size and structure. Venous: The inferior vena cava was not well visualized. IAS/Shunts: The interatrial septum was not well visualized.  LEFT VENTRICLE PLAX 2D LVIDd:         2.90 cm   Diastology LVIDs:         2.10 cm   LV e' medial:    7.36 cm/s LV PW:         1.10 cm   LV E/e' medial:  7.2 LV IVS:        1.10 cm   LV e' lateral:   6.48 cm/s LVOT diam:     2.00 cm   LV E/e' lateral: 8.2 LV SV:         38 LV SV Index:   19 LVOT Area:     3.14 cm  RIGHT VENTRICLE RV S  prime:     14.50 cm/s TAPSE (M-mode): 1.5 cm LEFT ATRIUM             Index LA diam:        3.10 cm 1.57 cm/m LA Vol (A2C):   18.5 ml 9.36 ml/m LA Vol (A4C):   25.0 ml 12.64 ml/m LA Biplane Vol: 22.5 ml 11.38 ml/m  AORTIC VALVE  PULMONIC VALVE AV Area (Vmax):    2.80 cm     PV Vmax:       1.25 m/s AV Area (Vmean):   2.78 cm     PV Peak grad:  6.2 mmHg AV Area (VTI):     2.92 cm AV Vmax:           111.00 cm/s AV Vmean:          75.700 cm/s AV VTI:            0.130 m AV Peak Grad:      4.9 mmHg AV Mean Grad:      3.0 mmHg LVOT Vmax:         99.00 cm/s LVOT Vmean:        67.100 cm/s LVOT VTI:          0.121 m LVOT/AV VTI ratio: 0.93  AORTA Ao Root diam: 2.60 cm Ao Asc diam:  2.70 cm MITRAL VALVE MV Area (PHT): 3.46 cm    SHUNTS MV Decel Time: 219 msec    Systemic VTI:  0.12 m MV E velocity: 52.90 cm/s  Systemic Diam: 2.00 cm MV A velocity: 81.00 cm/s MV E/A ratio:  0.65 Cherlynn Kaiser MD Electronically signed by Cherlynn Kaiser MD Signature Date/Time: 03/22/2022/9:42:10 AM    Final

## 2022-03-23 NOTE — TOC Benefit Eligibility Note (Signed)
Patient Teacher, English as a foreign language completed.    The patient is currently admitted and upon discharge could be taking Dificid 200 mg tablets.  The current 10 day co-pay is $11.20.   The patient is currently admitted and upon discharge could be taking Eliquis 5 mg.  The current 30 day co-pay is $11.20.   The patient is insured through [place], [place] Patient Advocate Specialist [place] Patient Advocate Team Direct Number: [phone]  Fax: [phone]

## 2022-03-23 NOTE — Progress Notes (Signed)
Per tele, patient had a run of widened QRS. VSS, pt asymptomatic. MD Karleen Hampshire made aware. EKG done showing prolonged QTc 644.Oncoming RN and oncoming MD paged to be made aware.

## 2022-03-23 NOTE — Progress Notes (Signed)
[facility]                                                                               [name], is a 72 y.o. female, DOB - [birth year], KT:[id number] Admit date - 03/21/2022    Outpatient Primary MD for the patient is [name], [name], DO  LOS - 1  days    Brief summary    Sarah Wu is a 72 y.o. female with medical history significant of prediabetes, osteoarthritis, fibroid tumor, hyperlipidemia, hypertension, postmenopausal bleeding, peripheral neuropathy, stage IV uterine cancer with pancytopenia who was transferred to this facility from [facility] where she has been treated for the past 2 days due to C. difficile colitis, electrolyte abnormalities, sinus tachycardia with QT prolongation that has now converted into atrial fibrillation with RVR . Cardiology consulted and patient started on IV amiodarone and IV heparin. She was transitioned to oral amiodarone and eliquis.    Assessment & Plan    Assessment and Plan:  Pancolitis secondary to C. difficile infection Patient was started on Dificid 200 mg twice daily KUB this morning showed distended bowels with gas in the small bowel and colon, possibly from ileus. Keep potassium greater than 4 and magnesium greater than 2 Monitor electrolytes and pain control Symptomatic management with IV antiemetics and IV fluids CT abd shows pan colitis and adynamic ileus.    Atrial fibrillation with RVR TSH wnl , free t4 is 1.25.  Converted to sinus tachy. Cardiology consulted and recommendations given.  - rate controlled with oral amiodarone and oral metoprolol.  - on eliquis for anti coagulation.     Hypokalemia  Replaced, check magnesium levels later today.   Hypomagnesemia:  Replaced.   Normocytic anemia.  Hemoglobin stable around 9.     Mild thrombocytopenia:  - continue to monitor. No bleeding evident.   Stage 4 Uterine cancer:  S/p robotic assisted laparoscopic total  hysterectomy with bilateral salpingo-oophorectomy,. S/p reduced dose of chemotherapy completed 6 cycles.  Follows up with Dr. Alvy Bimler    Peripheral neuropathy from chemotherapy    Acquired Pancytopenia:  Transfuse to keep hemoglobin greater than 7.      RN Pressure Injury Documentation: Pressure Injury 03/21/22 Sacrum Mid Stage 2 -  Partial thickness loss of dermis presenting as a shallow open injury with a red, pink wound bed without slough. Red (Active)  03/21/22 0800  Location: Sacrum  Location Orientation: Mid  Staging: Stage 2 -  Partial thickness loss of dermis presenting as a shallow open injury with a red, pink wound bed without slough.  Wound Description (Comments): Red  Present on Admission: Yes  Dressing Type Foam - Lift dressing to assess site every shift 03/23/22 0800   Foam dressing and local wound care.    Estimated body mass index is 28.13 kg/m as calculated from the following:   Height as of 03/12/22: 5' 8"$  (1.727 m).   Weight as of 03/12/22: 83.9 kg.  Code Status: FULL CODE.  DVT Prophylaxis:  IV HEPARIN.  apixaban (ELIQUIS) tablet 5 mg   Level of Care: Level of care: Telemetry Family Communication: Updated patient's Daughter at bedside.   Disposition Plan:  Remains inpatient appropriate:  advance diet as tolerated.   Procedures:  Echocardiogram  KUB  Consultants:   Cardiology.   Antimicrobials:   Anti-infectives (From admission, onward)    Start     Dose/Rate Route Frequency Ordered Stop   03/21/22 1000  fidaxomicin (DIFICID) tablet 200 mg        200 mg Oral 2 times daily 03/21/22 0654 03/31/22 0959        Medications  Scheduled Meds:  amiodarone  400 mg Oral BID   Followed by   Derrill Memo ON 03/30/2022] amiodarone  200 mg Oral BID   Followed by   Derrill Memo ON 04/06/2022] amiodarone  200 mg Oral Daily   apixaban  5 mg Oral BID   Chlorhexidine Gluconate Cloth  6 each Topical Q0600   fidaxomicin  200 mg Oral BID   metoprolol tartrate   12.5 mg Oral BID   sodium chloride flush  10-40 mL Intracatheter Q12H   Continuous Infusions:  famotidine (PEPCID) IV Stopped (03/22/22 1419)   lactated ringers Stopped (03/23/22 0927)   potassium chloride     potassium chloride 10 mEq (03/23/22 1229)   PRN Meds:.acetaminophen **OR** acetaminophen, hydrOXYzine, ketorolac, metoprolol tartrate, ondansetron (ZOFRAN) IV, prochlorperazine, sodium chloride flush    Subjective:   [name] was seen and examined today.  Feeling much better. No nausea, vomiting. Abd pain has improved.   Objective:   Vitals:   03/23/22 0800 03/23/22 0900 03/23/22 1000 03/23/22 1109  BP:  120/70 131/72 120/77  Pulse: (!) 102 (!) 105 (!) 103 (!) 103  Resp: 20 (!) 24 (!) 32 16  Temp:    98.9 F (37.2 C)  TempSrc:    Oral  SpO2: 95% 94% 95% 97%    Intake/Output Summary (Last 24 hours) at 03/23/2022 1313 Last data filed at 03/23/2022 1039 Gross per 24 hour  Intake 1494.91 ml  Output 500 ml  Net 994.91 ml    There were no vitals filed for this visit.   Exam General exam: Appears calm and comfortable  Respiratory system: Clear to auscultation. Respiratory effort normal. Cardiovascular system: S1 & S2 heard, RRR. No JVD,  Gastrointestinal system: Abdomen is nondistended, soft and nontender.  Central nervous system: Alert and oriented. No focal neurological deficits. Extremities: Symmetric 5 x 5 power. Skin: No rashes, lesions or ulcers Psychiatry:  Mood & affect appropriate.      Data Reviewed:  I have personally reviewed following labs and imaging studies   CBC Lab Results  Component Value Date   WBC 6.5 03/23/2022   RBC 2.79 (L) 03/23/2022   HGB 7.9 (L) 03/23/2022   HCT 24.9 (L) 03/23/2022   MCV 89.2 03/23/2022   MCH 28.3 03/23/2022   PLT 117 (L) 03/23/2022   MCHC 31.7 03/23/2022   RDW 17.6 (H) 03/23/2022   LYMPHSABS 0.7 03/12/2022   MONOABS 0.1 03/12/2022   EOSABS 0.0 03/12/2022   BASOSABS 0.0 03/12/2022     Last  metabolic panel Lab Results  Component Value Date   NA 133 (L) 03/23/2022   K 2.9 (L) 03/23/2022   CL 97 (L) 03/23/2022   CO2 23 03/23/2022   BUN 24 (H) 03/23/2022   CREATININE 1.03 (H) 03/23/2022   GLUCOSE 100 (H) 03/23/2022   GFRNONAA 58 (L) 03/23/2022   CALCIUM 7.9 (L) 03/23/2022   PHOS 2.4 (L) 03/23/2022   PROT 6.1 (L) 03/22/2022   ALBUMIN 2.1 (L) 03/22/2022   BILITOT 1.4 (H) 03/22/2022   ALKPHOS 66 03/22/2022  AST 29 03/22/2022   ALT 22 03/22/2022   ANIONGAP 13 03/23/2022    CBG (last 3)  Recent Labs    03/22/22 2111 03/23/22 0804 03/23/22 1120  GLUCAP 114* 100* 92       Coagulation Profile: No results for input(s): "INR", "PROTIME" in the last 168 hours.   Radiology Studies: CT ABDOMEN PELVIS W CONTRAST  Result Date: 03/22/2022 CLINICAL DATA:  Acute non localized abdominal pain. Prior uterine carcinoma. * Tracking Code: BO * EXAM: CT ABDOMEN AND PELVIS WITH CONTRAST TECHNIQUE: Multidetector CT imaging of the abdomen and pelvis was performed using the standard protocol following bolus administration of intravenous contrast. RADIATION DOSE REDUCTION: This exam was performed according to the departmental dose-optimization program which includes automated exposure control, adjustment of the mA and/or kV according to patient size and/or use of iterative reconstruction technique. CONTRAST:  176m OMNIPAQUE IOHEXOL 300 MG/ML  SOLN COMPARISON:  11/21/2021 FINDINGS: Lower Chest: New bilateral lower lobe atelectasis or consolidation and small bilateral pleural effusions. Hepatobiliary: No hepatic masses identified. Gallstones are seen, however there is no evidence of cholecystitis or biliary dilatation. Pancreas:  No mass or inflammatory changes. Spleen: Within normal limits in size and appearance. Adrenals/Urinary Tract: No suspicious masses identified. No evidence of ureteral calculi or hydronephrosis. Stomach/Bowel: Diffuse small bowel and colonic dilatation is seen,  consistent with ileus. Mild diffuse wall thickening is seen throughout the colon, consistent with diffuse colitis. No evidence of small bowel dilatation or wall thickening. Normal appendix visualized. No evidence of abscess or free fluid. Vascular/Lymphatic: No pathologically enlarged lymph nodes. No acute vascular findings. Reproductive: Prior hysterectomy noted. Adnexal regions are unremarkable in appearance. Other:  None. Musculoskeletal:  No suspicious bone lesions identified. IMPRESSION: Mild diffuse colitis. Adynamic ileus. No evidence of recurrent or metastatic carcinoma. Cholelithiasis. No radiographic evidence of cholecystitis. New bilateral lower lobe atelectasis or consolidation and small bilateral pleural effusions. Electronically Signed   By: JMarlaine HindM.D.   On: 03/22/2022 15:54   DG Abd 1 View  Result Date: 03/22/2022 CLINICAL DATA:  Abdominal distension.  History of C diff. EXAM: ABDOMEN - 1 VIEW COMPARISON:  CT scan 11/21/2021 FINDINGS: Diffuse gaseous distension of small bowel and colon noted with gas visible in the descending colon to the level of the sigmoid segment. Mild gaseous distention of the stomach is associated. IMPRESSION: Diffuse gaseous distension of small bowel and colon. Imaging features more suggestive of ileus than obstruction at this time. Electronically Signed   By: EMisty StanleyM.D.   On: 03/22/2022 10:29   ECHOCARDIOGRAM COMPLETE  Result Date: 03/22/2022    ECHOCARDIOGRAM REPORT   Patient Name:   Sarah DIGIOVANNADate of Exam: 03/22/2022 Medical Rec #:  [id number]         Height:       68.0 in Accession #:    [id number]        Weight:       185.0 lb Date of Birth:  [birth year]         BSA:          1.977 m Patient Age:    89years           BP:           113/76 mmHg Patient Gender: F                  HR:           106 bpm. Exam Location:  Inpatient Procedure:  2D Echo, Cardiac Doppler and Color Doppler Indications:    I48.9 Atrial Fibrillation  History:         Patient has no prior history of Echocardiogram examinations.                 C-Diff; uterine cancer; Risk Factors:Hypertension, Dyslipidemia                 and Non-Smoker.  Sonographer:    Wilkie Aye RVT RCS Referring Phys: [id number] [place]  Sonographer Comments: No subcostal window, Technically challenging study due to limited acoustic windows, suboptimal parasternal window and suboptimal apical window. Image acquisition challenging due to respiratory motion and exam done with patient in supine position. IMPRESSIONS  1. Left ventricular ejection fraction, by estimation, is 65 to 70%. The left ventricle has normal function. The left ventricle has no regional wall motion abnormalities. There is mild left ventricular hypertrophy. Left ventricular diastolic parameters are consistent with Grade I diastolic dysfunction (impaired relaxation).  2. Right ventricular systolic function is normal. The right ventricular size is normal. Tricuspid regurgitation signal is inadequate for assessing PA pressure.  3. The mitral valve is grossly normal. Trivial mitral valve regurgitation. No evidence of mitral stenosis.  4. The aortic valve is grossly normal. Aortic valve regurgitation is not visualized. No aortic stenosis is present. FINDINGS  Left Ventricle: Left ventricular ejection fraction, by estimation, is 65 to 70%. The left ventricle has normal function. The left ventricle has no regional wall motion abnormalities. The left ventricular internal cavity size was normal in size. There is  mild left ventricular hypertrophy. Left ventricular diastolic parameters are consistent with Grade I diastolic dysfunction (impaired relaxation). Right Ventricle: The right ventricular size is normal. No increase in right ventricular wall thickness. Right ventricular systolic function is normal. Tricuspid regurgitation signal is inadequate for assessing PA pressure. Left Atrium: Left atrial size was normal in size. Right Atrium: Right  atrial size was normal in size. Pericardium: There is no evidence of pericardial effusion. Mitral Valve: The mitral valve is grossly normal. Trivial mitral valve regurgitation. No evidence of mitral valve stenosis. Tricuspid Valve: The tricuspid valve is normal in structure. Tricuspid valve regurgitation is not demonstrated. No evidence of tricuspid stenosis. Aortic Valve: The aortic valve is grossly normal. Aortic valve regurgitation is not visualized. No aortic stenosis is present. Aortic valve mean gradient measures 3.0 mmHg. Aortic valve peak gradient measures 4.9 mmHg. Aortic valve area, by VTI measures 2.92 cm. Pulmonic Valve: The pulmonic valve was normal in structure. Pulmonic valve regurgitation is trivial. No evidence of pulmonic stenosis. Aorta: The aortic root is normal in size and structure. Venous: The inferior vena cava was not well visualized. IAS/Shunts: The interatrial septum was not well visualized.  LEFT VENTRICLE PLAX 2D LVIDd:         2.90 cm   Diastology LVIDs:         2.10 cm   LV e' medial:    7.36 cm/s LV PW:         1.10 cm   LV E/e' medial:  7.2 LV IVS:        1.10 cm   LV e' lateral:   6.48 cm/s LVOT diam:     2.00 cm   LV E/e' lateral: 8.2 LV SV:         38 LV SV Index:   19 LVOT Area:     3.14 cm  RIGHT VENTRICLE RV S prime:     14.50 cm/s  TAPSE (M-mode): 1.5 cm LEFT ATRIUM             Index LA diam:        3.10 cm 1.57 cm/m LA Vol (A2C):   18.5 ml 9.36 ml/m LA Vol (A4C):   25.0 ml 12.64 ml/m LA Biplane Vol: 22.5 ml 11.38 ml/m  AORTIC VALVE                    PULMONIC VALVE AV Area (Vmax):    2.80 cm     PV Vmax:       1.25 m/s AV Area (Vmean):   2.78 cm     PV Peak grad:  6.2 mmHg AV Area (VTI):     2.92 cm AV Vmax:           111.00 cm/s AV Vmean:          75.700 cm/s AV VTI:            0.130 m AV Peak Grad:      4.9 mmHg AV Mean Grad:      3.0 mmHg LVOT Vmax:         99.00 cm/s LVOT Vmean:        67.100 cm/s LVOT VTI:          0.121 m LVOT/AV VTI ratio: 0.93  AORTA Ao Root  diam: 2.60 cm Ao Asc diam:  2.70 cm MITRAL VALVE MV Area (PHT): 3.46 cm    SHUNTS MV Decel Time: 219 msec    Systemic VTI:  0.12 m MV E velocity: 52.90 cm/s  Systemic Diam: 2.00 cm MV A velocity: 81.00 cm/s MV E/A ratio:  0.65 Cherlynn Kaiser MD Electronically signed by Cherlynn Kaiser MD Signature Date/Time: 03/22/2022/9:42:10 AM    Final        Hosie Poisson M.D. [facility] 03/23/2022, 1:13 PM  Available via Epic secure chat 7am-7pm After 7 pm, please refer to night coverage provider listed on amion.

## 2022-03-24 DIAGNOSIS — I4891 Unspecified atrial fibrillation: Secondary | ICD-10-CM | POA: Diagnosis not present

## 2022-03-24 DIAGNOSIS — I10 Essential (primary) hypertension: Secondary | ICD-10-CM | POA: Diagnosis not present

## 2022-03-24 DIAGNOSIS — A0472 Enterocolitis due to Clostridium difficile, not specified as recurrent: Secondary | ICD-10-CM | POA: Diagnosis not present

## 2022-03-24 DIAGNOSIS — G620 Drug-induced polyneuropathy: Secondary | ICD-10-CM | POA: Diagnosis not present

## 2022-03-24 DIAGNOSIS — E440 Moderate protein-calorie malnutrition: Secondary | ICD-10-CM | POA: Insufficient documentation

## 2022-03-24 DIAGNOSIS — E43 Unspecified severe protein-calorie malnutrition: Secondary | ICD-10-CM | POA: Insufficient documentation

## 2022-03-24 LAB — PHOSPHORUS: Phosphorus: 2.8 mg/dL (ref 2.5–4.6)

## 2022-03-24 LAB — IRON AND TIBC
Iron: 36 ug/dL (ref 28–170)
Saturation Ratios: 24 % (ref 10.4–31.8)
TIBC: 151 ug/dL — ABNORMAL LOW (ref 250–450)
UIBC: 115 ug/dL

## 2022-03-24 LAB — RETICULOCYTES
Immature Retic Fract: 42.5 % — ABNORMAL HIGH (ref 2.3–15.9)
RBC.: 2.73 MIL/uL — ABNORMAL LOW (ref 3.87–5.11)
Retic Count, Absolute: 42.6 10*3/uL (ref 19.0–186.0)
Retic Ct Pct: 1.6 % (ref 0.4–3.1)

## 2022-03-24 LAB — BASIC METABOLIC PANEL
Anion gap: 11 (ref 5–15)
BUN: 19 mg/dL (ref 8–23)
CO2: 24 mmol/L (ref 22–32)
Calcium: 7.7 mg/dL — ABNORMAL LOW (ref 8.9–10.3)
Chloride: 99 mmol/L (ref 98–111)
Creatinine, Ser: 0.85 mg/dL (ref 0.44–1.00)
GFR, Estimated: >60 mL/min (ref >60)
Glucose, Bld: 87 mg/dL (ref 70–99)
Potassium: 3 mmol/L — ABNORMAL LOW (ref 3.5–5.1)
Sodium: 134 mmol/L — ABNORMAL LOW (ref 135–145)

## 2022-03-24 LAB — CBC
HCT: 23 % — ABNORMAL LOW (ref 36.0–46.0)
Hemoglobin: 7.3 g/dL — ABNORMAL LOW (ref 12.0–15.0)
MCH: 28.7 pg (ref 26.0–34.0)
MCHC: 31.7 g/dL (ref 30.0–36.0)
MCV: 90.6 fL (ref 80.0–100.0)
Platelets: 101 10*3/uL — ABNORMAL LOW (ref 150–400)
RBC: 2.54 MIL/uL — ABNORMAL LOW (ref 3.87–5.11)
RDW: 17.9 % — ABNORMAL HIGH (ref 11.5–15.5)
WBC: 5.1 10*3/uL (ref 4.0–10.5)
nRBC: 1.6 % — ABNORMAL HIGH (ref 0.0–0.2)

## 2022-03-24 LAB — T3, FREE: T3, Free: 1.2 pg/mL — ABNORMAL LOW (ref 2.0–4.4)

## 2022-03-24 LAB — GLUCOSE, CAPILLARY
Glucose-Capillary: 82 mg/dL (ref 70–99)
Glucose-Capillary: 93 mg/dL (ref 70–99)
Glucose-Capillary: 80 mg/dL (ref 70–99)
Glucose-Capillary: 77 mg/dL (ref 70–99)

## 2022-03-24 LAB — FOLATE: Folate: 12.4 ng/mL (ref >5.9)

## 2022-03-24 LAB — FERRITIN: Ferritin: 1373 ng/mL — ABNORMAL HIGH (ref 11–307)

## 2022-03-24 LAB — OCCULT BLOOD X 1 CARD TO LAB, STOOL: Fecal Occult Bld: POSITIVE — AB

## 2022-03-24 LAB — VITAMIN B12: Vitamin B-12: 5396 pg/mL — ABNORMAL HIGH (ref 180–914)

## 2022-03-24 LAB — MAGNESIUM: Magnesium: 1.9 mg/dL (ref 1.7–2.4)

## 2022-03-24 LAB — PREPARE RBC (CROSSMATCH)

## 2022-03-24 MED ORDER — VITAMIN C 500 MG PO TABS
500.0000 mg | ORAL_TABLET | Freq: Every day | ORAL | Status: DC
  Administered 2022-03-25 – 2022-04-08 (×15): 500 mg via ORAL
  Filled 2022-03-24 (×15): qty 1

## 2022-03-24 MED ORDER — POTASSIUM CHLORIDE CRYS ER 20 MEQ PO TBCR
40.0000 meq | EXTENDED_RELEASE_TABLET | Freq: Two times a day (BID) | ORAL | Status: DC
  Filled 2022-03-24: qty 2

## 2022-03-24 MED ORDER — SODIUM CHLORIDE 0.9% IV SOLUTION: Freq: Once | INTRAVENOUS | Status: AC

## 2022-03-24 MED ORDER — POTASSIUM CHLORIDE 10 MEQ/100ML IV SOLN
10.0000 meq | INTRAVENOUS | Status: AC
  Administered 2022-03-24 (×2): 10 meq via INTRAVENOUS
  Filled 2022-03-24 (×2): qty 100

## 2022-03-24 MED ORDER — ADULT MULTIVITAMIN W/MINERALS CH
1.0000 | ORAL_TABLET | Freq: Every day | ORAL | Status: DC
  Administered 2022-03-25 – 2022-04-08 (×15): 1 via ORAL
  Filled 2022-03-24 (×15): qty 1

## 2022-03-24 MED ORDER — POTASSIUM CHLORIDE 10 MEQ/100ML IV SOLN
10.0000 meq | INTRAVENOUS | Status: AC
  Administered 2022-03-24 – 2022-03-25 (×4): 10 meq via INTRAVENOUS
  Filled 2022-03-24 (×4): qty 100

## 2022-03-24 NOTE — Progress Notes (Signed)
Sarah Wu   DOB:1950-11-28   [id number]    ASSESSMENT & PLAN:  Uterine cancer She has completed adjuvant treatment Her treatment will be placed on hold to allow recovery from her current hospitalization I have informed the GYN navigator to update radiation oncologist to also put her radiation therapy on hold We will focus on supportive care Her CT imaging show no evidence of disease   C. difficile colitis Continue medical management   Acquired pancytopenia Due to recent chemotherapy This is much improved Agree with blood transfusion support   Electrolyte imbalance Due to colitis Replace as needed   Transient atrial fibrillation Due to her illness Agree with anticoagulation therapy Monitor closely for signs of bleeding  Moderate protein calorie malnutrition Appreciate dietitian consult  Discharge planning Anticipate she will be here for the next 3 to 5 days I will return to check on her on Thursday  All questions were answered. The patient knows to call the clinic with any problems, questions or concerns.   The total time spent in the appointment was 40 minutes encounter with patients including review of chart and various tests results, discussions about plan of care and coordination of care plan  Sarah Lark, MD 03/24/2022 2:36 PM  Subjective:  She is not feeling good.  Sarah Wu.  Rectal tube in place She felt abdominal discomfort from distention.  She felt excessively fatigue  Objective:  Vitals:   03/24/22 0524 03/24/22 1158  BP: 107/68 115/67  Pulse: 95 (!) 102  Resp: 18 18  Temp: 98.1 F (36.7 C) 98.1 F (36.7 C)  SpO2: 95% 96%     Intake/Output Summary (Last 24 hours) at 03/24/2022 1436 Last data filed at 03/24/2022 1411 Gross per 24 hour  Intake 290 ml  Output 825 ml  Net -535 ml    GENERAL:alert, no distress and comfortable Rectal tube in place.  Abdomen appears distended NEURO: alert & oriented x 3 with fluent speech, no  focal motor/sensory deficits   Labs:  Recent Labs    03/12/22 0851 03/21/22 0621 03/22/22 0235 03/23/22 0600 03/24/22 0330  NA 140 137 135 133* 134*  K 2.9* 3.6 3.4* 2.9* 3.0*  CL 98 99 96* 97* 99  CO2 27 22 23 23 24  $ GLUCOSE 111* 113* 123* 100* 87  BUN 10 22 24* 24* 19  CREATININE 0.58 0.84 1.00 1.03* 0.85  CALCIUM 9.2 7.9* 8.0* 7.9* 7.7*  GFRNONAA >60 >60 60* 58* >60  PROT 7.1 6.2* 6.1*  --   --   ALBUMIN 3.6 2.2* 2.1*  --   --   AST 15 37 29  --   --   ALT 8 23 22  $ --   --   ALKPHOS 54 62 66  --   --   BILITOT 0.9 1.6* 1.4*  --   --     Studies:  CT ABDOMEN PELVIS W CONTRAST  Result Date: 03/22/2022 CLINICAL DATA:  Acute non localized abdominal pain. Prior uterine carcinoma. * Tracking Code: BO * EXAM: CT ABDOMEN AND PELVIS WITH CONTRAST TECHNIQUE: Multidetector CT imaging of the abdomen and pelvis was performed using the standard protocol following bolus administration of intravenous contrast. RADIATION DOSE REDUCTION: This exam was performed according to the departmental dose-optimization program which includes automated exposure control, adjustment of the mA and/or kV according to patient size and/or use of iterative reconstruction technique. CONTRAST:  136m OMNIPAQUE IOHEXOL 300 MG/ML  SOLN COMPARISON:  11/21/2021 FINDINGS: Lower Chest: New bilateral lower  lobe atelectasis or consolidation and small bilateral pleural effusions. Hepatobiliary: No hepatic masses identified. Gallstones are seen, however there is no evidence of cholecystitis or biliary dilatation. Pancreas:  No mass or inflammatory changes. Spleen: Within normal limits in size and appearance. Adrenals/Urinary Tract: No suspicious masses identified. No evidence of ureteral calculi or hydronephrosis. Stomach/Bowel: Diffuse small bowel and colonic dilatation is seen, consistent with ileus. Mild diffuse wall thickening is seen throughout the colon, consistent with diffuse colitis. No evidence of small bowel dilatation  or wall thickening. Normal appendix visualized. No evidence of abscess or free fluid. Vascular/Lymphatic: No pathologically enlarged lymph nodes. No acute vascular findings. Reproductive: Prior hysterectomy noted. Adnexal regions are unremarkable in appearance. Other:  None. Musculoskeletal:  No suspicious bone lesions identified. IMPRESSION: Mild diffuse colitis. Adynamic ileus. No evidence of recurrent or metastatic carcinoma. Cholelithiasis. No radiographic evidence of cholecystitis. New bilateral lower lobe atelectasis or consolidation and small bilateral pleural effusions. Electronically Signed   By: Marlaine Hind M.D.   On: 03/22/2022 15:54   DG Abd 1 View  Result Date: 03/22/2022 CLINICAL DATA:  Abdominal distension.  History of C diff. EXAM: ABDOMEN - 1 VIEW COMPARISON:  CT scan 11/21/2021 FINDINGS: Diffuse gaseous distension of small bowel and colon noted with gas visible in the descending colon to the level of the sigmoid segment. Mild gaseous distention of the stomach is associated. IMPRESSION: Diffuse gaseous distension of small bowel and colon. Imaging features more suggestive of ileus than obstruction at this time. Electronically Signed   By: Misty Stanley M.D.   On: 03/22/2022 10:29   ECHOCARDIOGRAM COMPLETE  Result Date: 03/22/2022    ECHOCARDIOGRAM REPORT   Patient Name:   Sarah Wu Date of Exam: 03/22/2022 Medical Rec #:  [id number]          Height:       68.0 in Accession #:    [id number]         Weight:       185.0 lb Date of Birth:  23-Apr-1950          BSA:          1.977 m Patient Age:    72 years           BP:           113/76 mmHg Patient Gender: F                  HR:           106 bpm. Exam Location:  Inpatient Procedure: 2D Echo, Cardiac Doppler and Color Doppler Indications:    I48.9 Atrial Fibrillation  History:        Patient has no prior history of Echocardiogram examinations.                 C-Diff; uterine cancer; Risk Factors:Hypertension, Dyslipidemia                  and Non-Smoker.  Sonographer:    Wilkie Aye RVT RCS Referring Phys: [id number] [place]  Sonographer Comments: No subcostal window, Technically challenging study due to limited acoustic windows, suboptimal parasternal window and suboptimal apical window. Image acquisition challenging due to respiratory motion and exam done with patient in supine position. IMPRESSIONS  1. Left ventricular ejection fraction, by estimation, is 65 to 70%. The left ventricle has normal function. The left ventricle has no regional wall motion abnormalities. There is mild left ventricular hypertrophy. Left ventricular diastolic parameters are consistent with  Grade I diastolic dysfunction (impaired relaxation).  2. Right ventricular systolic function is normal. The right ventricular size is normal. Tricuspid regurgitation signal is inadequate for assessing PA pressure.  3. The mitral valve is grossly normal. Trivial mitral valve regurgitation. No evidence of mitral stenosis.  4. The aortic valve is grossly normal. Aortic valve regurgitation is not visualized. No aortic stenosis is present. FINDINGS  Left Ventricle: Left ventricular ejection fraction, by estimation, is 65 to 70%. The left ventricle has normal function. The left ventricle has no regional wall motion abnormalities. The left ventricular internal cavity size was normal in size. There is  mild left ventricular hypertrophy. Left ventricular diastolic parameters are consistent with Grade I diastolic dysfunction (impaired relaxation). Right Ventricle: The right ventricular size is normal. No increase in right ventricular wall thickness. Right ventricular systolic function is normal. Tricuspid regurgitation signal is inadequate for assessing PA pressure. Left Atrium: Left atrial size was normal in size. Right Atrium: Right atrial size was normal in size. Pericardium: There is no evidence of pericardial effusion. Mitral Valve: The mitral valve is grossly normal. Trivial mitral  valve regurgitation. No evidence of mitral valve stenosis. Tricuspid Valve: The tricuspid valve is normal in structure. Tricuspid valve regurgitation is not demonstrated. No evidence of tricuspid stenosis. Aortic Valve: The aortic valve is grossly normal. Aortic valve regurgitation is not visualized. No aortic stenosis is present. Aortic valve mean gradient measures 3.0 mmHg. Aortic valve peak gradient measures 4.9 mmHg. Aortic valve area, by VTI measures 2.92 cm. Pulmonic Valve: The pulmonic valve was normal in structure. Pulmonic valve regurgitation is trivial. No evidence of pulmonic stenosis. Aorta: The aortic root is normal in size and structure. Venous: The inferior vena cava was not well visualized. IAS/Shunts: The interatrial septum was not well visualized.  LEFT VENTRICLE PLAX 2D LVIDd:         2.90 cm   Diastology LVIDs:         2.10 cm   LV e' medial:    7.36 cm/s LV PW:         1.10 cm   LV E/e' medial:  7.2 LV IVS:        1.10 cm   LV e' lateral:   6.48 cm/s LVOT diam:     2.00 cm   LV E/e' lateral: 8.2 LV SV:         38 LV SV Index:   19 LVOT Area:     3.14 cm  RIGHT VENTRICLE RV S prime:     14.50 cm/s TAPSE (M-mode): 1.5 cm LEFT ATRIUM             Index LA diam:        3.10 cm 1.57 cm/m LA Vol (A2C):   18.5 ml 9.36 ml/m LA Vol (A4C):   25.0 ml 12.64 ml/m LA Biplane Vol: 22.5 ml 11.38 ml/m  AORTIC VALVE                    PULMONIC VALVE AV Area (Vmax):    2.80 cm     PV Vmax:       1.25 m/s AV Area (Vmean):   2.78 cm     PV Peak grad:  6.2 mmHg AV Area (VTI):     2.92 cm AV Vmax:           111.00 cm/s AV Vmean:          75.700 cm/s AV VTI:  0.130 m AV Peak Grad:      4.9 mmHg AV Mean Grad:      3.0 mmHg LVOT Vmax:         99.00 cm/s LVOT Vmean:        67.100 cm/s LVOT VTI:          0.121 m LVOT/AV VTI ratio: 0.93  AORTA Ao Root diam: 2.60 cm Ao Asc diam:  2.70 cm MITRAL VALVE MV Area (PHT): 3.46 cm    SHUNTS MV Decel Time: 219 msec    Systemic VTI:  0.12 m MV E velocity: 52.90 cm/s   Systemic Diam: 2.00 cm MV A velocity: 81.00 cm/s MV E/A ratio:  0.65 Cherlynn Kaiser MD Electronically signed by Cherlynn Kaiser MD Signature Date/Time: 03/22/2022/9:42:10 AM    Final

## 2022-03-24 NOTE — Progress Notes (Signed)
[facility]                                                                               Sarah Wu, is a 72 y.o. female, DOB - May 01, 1950, [id number] Admit date - 03/21/2022    Outpatient Primary MD for the patient is [name], [name], DO  LOS - 2  days    Brief summary    Sarah Wu is a 72 y.o. female with medical history significant of prediabetes, osteoarthritis, fibroid tumor, hyperlipidemia, hypertension, postmenopausal bleeding, peripheral neuropathy, stage IV uterine cancer with pancytopenia who was transferred to this facility from [facility] where she has been treated for the past 2 days due to C. difficile colitis, electrolyte abnormalities, sinus tachycardia with QT prolongation that has now converted into atrial fibrillation with RVR . Cardiology consulted and patient started on IV amiodarone and IV heparin. She was transitioned to oral amiodarone and eliquis.    Assessment & Plan    Assessment and Plan:  Pancolitis secondary to C. difficile infection Patient was started on Dificid 200 mg twice daily KUB showed distended bowels with gas in the small bowel and colon, possibly from ileus. CT abd shows pan colitis and adynamic ileus.  Symptomatic management with IV antiemetics and IV fluids Advancing diet as tolerated. Diarrhea improving.  Febrile overnight, blood cultures obtained.     Atrial fibrillation with RVR TSH wnl , free t4 is 1.25.  Converted to sinus tachy. Cardiology consulted and recommendations given.  - rate controlled with oral amiodarone and oral metoprolol.  - on eliquis for anti coagulation.   Prolonged QTc  Keep potassium greater than 4 and magnesium greater than 2 Monitor electrolytes   Hypokalemia  Replaced.  Recheck in am.    Hypomagnesemia:  Replaced.    Mild thrombocytopenia:  - continue to monitor. No bleeding evident.   Stage 4 Uterine cancer:  S/p robotic assisted  laparoscopic total hysterectomy with bilateral salpingo-oophorectomy,. S/p reduced dose of chemotherapy completed 6 cycles.  Follows up with Dr. Alvy Bimler    Peripheral neuropathy from chemotherapy   Acquired Pancytopenia:  Hemoglobin is 7.3, platelets at 101,000. Hemoglobin dropped from 9 to 7.3. suspect from acute colitis.  Transfuse 1 unit of prbc transfusion.  Stool for occult blood ordered.   Therapy evaluation ordered .     RN Pressure Injury Documentation: Pressure Injury 03/21/22 Sacrum Mid Stage 3 -  Full thickness tissue loss. Subcutaneous fat may be visible but bone, tendon or muscle are NOT exposed. Red (Active)  03/21/22 0800  Location: Sacrum  Location Orientation: Mid  Staging: Stage 3 -  Full thickness tissue loss. Subcutaneous fat may be visible but bone, tendon or muscle are NOT exposed.  Wound Description (Comments): Red  Present on Admission: Yes  Dressing Type Foam - Lift dressing to assess site every shift 03/23/22 2246   Foam dressing and local wound care.    Estimated body mass index is 27.56 kg/m as calculated from the following:   Height as of this encounter: 5' 8"$  (1.727 m).   Weight as of this encounter: 82.2 kg.  Code Status: FULL CODE.  DVT Prophylaxis:  I apixaban (ELIQUIS)  tablet 5 mg   Level of Care: Level of care: Telemetry Family Communication: Updated patient's Daughter at bedside.   Disposition Plan:     Remains inpatient appropriate:  advance diet as tolerated.   Procedures:  Echocardiogram  KUB  Consultants:   Cardiology.   Antimicrobials:   Anti-infectives (From admission, onward)    Start     Dose/Rate Route Frequency Ordered Stop   03/21/22 1000  fidaxomicin (DIFICID) tablet 200 mg        200 mg Oral 2 times daily 03/21/22 0654 03/31/22 0959        Medications  Scheduled Meds:  amiodarone  400 mg Oral BID   Followed by   Derrill Memo ON 03/30/2022] amiodarone  200 mg Oral BID   Followed by   Derrill Memo ON 04/06/2022]  amiodarone  200 mg Oral Daily   apixaban  5 mg Oral BID   Chlorhexidine Gluconate Cloth  6 each Topical Q0600   fidaxomicin  200 mg Oral BID   metoprolol tartrate  12.5 mg Oral BID   potassium chloride  40 mEq Oral BID   sodium chloride flush  10-40 mL Intracatheter Q12H   Continuous Infusions:  famotidine (PEPCID) IV 20 mg (03/23/22 1547)   PRN Meds:.acetaminophen **OR** acetaminophen, hydrOXYzine, metoprolol tartrate, ondansetron (ZOFRAN) IV, prochlorperazine, sodium chloride flush    Subjective:   Sarah Wu was seen and examined today.  Denies any chest pain or chest pressure, palpitations, headache, nausea, vomiting . Abd pain improved but not completely resolved. Rectal tube in place.   Objective:   Vitals:   03/23/22 2016 03/23/22 2134 03/24/22 0524 03/24/22 1158  BP:  110/67 107/68 115/67  Pulse:  (!) 102 95 (!) 102  Resp:  19 18 18  $ Temp:  98.8 F (37.1 C) 98.1 F (36.7 C) 98.1 F (36.7 C)  TempSrc:  Oral Oral Oral  SpO2:  97% 95% 96%  Weight: 82.2 kg     Height:        Intake/Output Summary (Last 24 hours) at 03/24/2022 1402 Last data filed at 03/24/2022 [id number] Gross per 24 hour  Intake 410 ml  Output 450 ml  Net -40 ml    Filed Weights   03/23/22 2016  Weight: 82.2 kg     Exam General exam: Appears calm and comfortable  Respiratory system: Clear to auscultation. Respiratory effort normal. Cardiovascular system: S1 & S2 heard, RRR. No JVD,  Gastrointestinal system: Abdomen is nondistended, soft and nontender.  Central nervous system: Alert and oriented. No focal neurological deficits. Extremities: Symmetric 5 x 5 power. Skin: No rashes, l Psychiatry: Mood & affect appropriate.      Data Reviewed:  I have personally reviewed following labs and imaging studies   CBC Lab Results  Component Value Date   WBC 5.1 03/24/2022   RBC 2.73 (L) 03/24/2022   HGB 7.3 (L) 03/24/2022   HCT 23.0 (L) 03/24/2022   MCV 90.6 03/24/2022   MCH 28.7  03/24/2022   PLT 101 (L) 03/24/2022   MCHC 31.7 03/24/2022   RDW 17.9 (H) 03/24/2022   LYMPHSABS 0.7 03/12/2022   MONOABS 0.1 03/12/2022   EOSABS 0.0 03/12/2022   BASOSABS 0.0 [id number]     Last metabolic panel Lab Results  Component Value Date   NA 134 (L) 03/24/2022   K 3.0 (L) 03/24/2022   CL 99 03/24/2022   CO2 24 03/24/2022   BUN 19 03/24/2022   CREATININE 0.85 03/24/2022   GLUCOSE 87 03/24/2022   GFRNONAA >  60 03/24/2022   CALCIUM 7.7 (L) 03/24/2022   PHOS 2.8 03/24/2022   PROT 6.1 (L) 03/22/2022   ALBUMIN 2.1 (L) 03/22/2022   BILITOT 1.4 (H) 03/22/2022   ALKPHOS 66 03/22/2022   AST 29 03/22/2022   ALT 22 03/22/2022   ANIONGAP 11 03/24/2022    CBG (last 3)  Recent Labs    03/23/22 2131 03/24/22 0742 03/24/22 1113  GLUCAP 93 82 93       Coagulation Profile: No results for input(s): "INR", "PROTIME" in the last 168 hours.   Radiology Studies: CT ABDOMEN PELVIS W CONTRAST  Result Date: 03/22/2022 CLINICAL DATA:  Acute non localized abdominal pain. Prior uterine carcinoma. * Tracking Code: BO * EXAM: CT ABDOMEN AND PELVIS WITH CONTRAST TECHNIQUE: Multidetector CT imaging of the abdomen and pelvis was performed using the standard protocol following bolus administration of intravenous contrast. RADIATION DOSE REDUCTION: This exam was performed according to the departmental dose-optimization program which includes automated exposure control, adjustment of the mA and/or kV according to patient size and/or use of iterative reconstruction technique. CONTRAST:  199m OMNIPAQUE IOHEXOL 300 MG/ML  SOLN COMPARISON:  11/21/2021 FINDINGS: Lower Chest: New bilateral lower lobe atelectasis or consolidation and small bilateral pleural effusions. Hepatobiliary: No hepatic masses identified. Gallstones are seen, however there is no evidence of cholecystitis or biliary dilatation. Pancreas:  No mass or inflammatory changes. Spleen: Within normal limits in size and appearance.  Adrenals/Urinary Tract: No suspicious masses identified. No evidence of ureteral calculi or hydronephrosis. Stomach/Bowel: Diffuse small bowel and colonic dilatation is seen, consistent with ileus. Mild diffuse wall thickening is seen throughout the colon, consistent with diffuse colitis. No evidence of small bowel dilatation or wall thickening. Normal appendix visualized. No evidence of abscess or free fluid. Vascular/Lymphatic: No pathologically enlarged lymph nodes. No acute vascular findings. Reproductive: Prior hysterectomy noted. Adnexal regions are unremarkable in appearance. Other:  None. Musculoskeletal:  No suspicious bone lesions identified. IMPRESSION: Mild diffuse colitis. Adynamic ileus. No evidence of recurrent or metastatic carcinoma. Cholelithiasis. No radiographic evidence of cholecystitis. New bilateral lower lobe atelectasis or consolidation and small bilateral pleural effusions. Electronically Signed   By: JMarlaine HindM.D.   On: 03/22/2022 15:54       VHosie PoissonM.D. [facility] 03/24/2022, 2:02 PM  Available via Epic secure chat 7am-7pm After 7 pm, please refer to night coverage provider listed on amion.

## 2022-03-24 NOTE — Progress Notes (Signed)
Initial Nutrition Assessment  DOCUMENTATION CODES:   Non-severe (moderate) malnutrition in context of chronic illness  INTERVENTION:  - Soft diet per MD. Advance as medically appropriate to Regular.  - Magic cup BID with meals, each supplement provides 290 kcal and 9 grams of protein. - Encourage intake at all meals of protein rich food sources in addition to supplements.  - Daily multivitamin and 566m vitamin C to support wound healing.  - Monitor weight trends.   NUTRITION DIAGNOSIS:   Moderate Malnutrition related to chronic illness as evidenced by mild fat depletion, mild muscle depletion.  GOAL:   Patient will meet greater than or equal to 90% of their needs  MONITOR:   PO intake, Supplement acceptance, Diet advancement, Weight trends  REASON FOR ASSESSMENT:   Malnutrition Screening Tool    ASSESSMENT:   72y.o. female with medical history significant of prediabetes, osteoarthritis, fibroid tumor, HLD, HTN, peripheral neuropathy, stage IV uterine cancer with pancytopenia [facility] due to C. difficile colitis, electrolyte abnormalities, sinus tachycardia with QT prolongation.  Met with patient and daughter at bedside, daughter provided most of information. She reports patient's UBW to be around 185#. Both patient and daughter did not feel patient has had any recent changes in weight. Per EMR, patient weighed at 218# in September and has dropped to current weight of 181# - a 37# or 17% weight loss in 5 months, signigicant for the time frame. However, daughter reports this has all been fluid due to being on lasix and has not been actual body weight.   Patient typically eats 3 small meals a day at home. Usually has grits with bacon or sausage for breakfast, a protein with vegetables for lunch, and a protein with vegetables and a grain for dinner. Daughter has been making patient  protein smoothies at home as well. Not sure of exact brand but states she mixes 2 scoops (which provide 60g of protein per daughter report) with milk and fruit.   Patient was just advanced from full liquids to a soft diet this morning. Daughter reports the grits were too hard but her sister brought in more grits for patient. Pt only consuming 10-25% of meals since admit per RN documentation.  Patient does not typically like Ensure but likes ice cream and agreeable to try Magic Cup to support intake during admission.   Medications reviewed and include: -  Labs reviewed:  Na 134 K+ 3.0 HA1C 5.4   NUTRITION - FOCUSED PHYSICAL EXAM:  Flowsheet Row Most Recent Value  Orbital Region Mild depletion  Upper Arm Region Mild depletion  Thoracic and Lumbar Region No depletion  Buccal Region Mild depletion  Temple Region Moderate depletion  Clavicle Bone Region Mild depletion  Clavicle and Acromion Bone Region Mild depletion  Scapular Bone Region Unable to assess  Dorsal Hand No depletion  Patellar Region No depletion  Anterior Thigh Region No depletion  Posterior Calf Region No depletion  Edema (RD Assessment) Mild  Hair Reviewed  Eyes Reviewed  Mouth Reviewed  Skin Reviewed  Nails Reviewed       Diet Order:   Diet Order             DIET SOFT Room service appropriate? Yes; Fluid consistency: Thin  Diet effective now                   EDUCATION NEEDS:  Education needs  have been addressed  Skin:  Skin Assessment: Skin Integrity Issues: Skin Integrity Issues:: Stage III Stage III: Sacrum  Last BM:  2/13 - rectal tube  Height:  Ht Readings from Last 1 Encounters:  03/23/22 5' 8"$  (1.727 m)   Weight:  Wt Readings from Last 1 Encounters:  03/23/22 82.2 kg   BMI:  Body mass index is 27.56 kg/m.  Estimated Nutritional Needs:  Kcal:  [id number] kcals Protein:  100-125 grams Fluid:  >/= 2.3L    Samson Frederic RD, LDN For contact information, refer to [facility].

## 2022-03-25 ENCOUNTER — Inpatient Hospital Stay (HOSPITAL_COMMUNITY): Payer: Medicare Other

## 2022-03-25 ENCOUNTER — Telehealth: Payer: Self-pay

## 2022-03-25 ENCOUNTER — Other Ambulatory Visit: Payer: Self-pay

## 2022-03-25 DIAGNOSIS — A0472 Enterocolitis due to Clostridium difficile, not specified as recurrent: Secondary | ICD-10-CM | POA: Diagnosis not present

## 2022-03-25 LAB — BPAM RBC
Blood Product Expiration Date: 202403082359
ISSUE DATE / TIME: 202402131537
Unit Type and Rh: 6200

## 2022-03-25 LAB — TYPE AND SCREEN
ABO/RH(D): A POS
Antibody Screen: NEGATIVE
Unit division: 0

## 2022-03-25 LAB — PHOSPHORUS
Phosphorus: 3.4 mg/dL (ref 2.5–4.6)
Phosphorus: 3.7 mg/dL (ref 2.5–4.6)

## 2022-03-25 LAB — GLUCOSE, CAPILLARY
Glucose-Capillary: 72 mg/dL (ref 70–99)
Glucose-Capillary: 80 mg/dL (ref 70–99)
Glucose-Capillary: 99 mg/dL (ref 70–99)
Glucose-Capillary: 87 mg/dL (ref 70–99)
Glucose-Capillary: 89 mg/dL (ref 70–99)

## 2022-03-25 LAB — BASIC METABOLIC PANEL
Anion gap: 13 (ref 5–15)
BUN: 12 mg/dL (ref 8–23)
CO2: 23 mmol/L (ref 22–32)
Calcium: 7.9 mg/dL — ABNORMAL LOW (ref 8.9–10.3)
Chloride: 98 mmol/L (ref 98–111)
Creatinine, Ser: 0.67 mg/dL (ref 0.44–1.00)
GFR, Estimated: >60 mL/min (ref >60)
Glucose, Bld: 98 mg/dL (ref 70–99)
Potassium: 3 mmol/L — ABNORMAL LOW (ref 3.5–5.1)
Sodium: 134 mmol/L — ABNORMAL LOW (ref 135–145)

## 2022-03-25 LAB — CBC
HCT: 27.6 % — ABNORMAL LOW (ref 36.0–46.0)
Hemoglobin: 8.8 g/dL — ABNORMAL LOW (ref 12.0–15.0)
MCH: 28.7 pg (ref 26.0–34.0)
MCHC: 31.9 g/dL (ref 30.0–36.0)
MCV: 89.9 fL (ref 80.0–100.0)
Platelets: 102 10*3/uL — ABNORMAL LOW (ref 150–400)
RBC: 3.07 MIL/uL — ABNORMAL LOW (ref 3.87–5.11)
RDW: 17.5 % — ABNORMAL HIGH (ref 11.5–15.5)
WBC: 5.7 10*3/uL (ref 4.0–10.5)
nRBC: 0.9 % — ABNORMAL HIGH (ref 0.0–0.2)

## 2022-03-25 LAB — MAGNESIUM: Magnesium: 1.4 mg/dL — ABNORMAL LOW (ref 1.7–2.4)

## 2022-03-25 MED ORDER — ZINC OXIDE 40 % EX OINT
TOPICAL_OINTMENT | Freq: Four times a day (QID) | CUTANEOUS | Status: DC | PRN
  Filled 2022-03-25: qty 57

## 2022-03-25 MED ORDER — HYDRALAZINE HCL 20 MG/ML IJ SOLN: 10.0000 mg | INTRAMUSCULAR | Status: DC | PRN

## 2022-03-25 MED ORDER — TRAMADOL HCL 50 MG PO TABS
50.0000 mg | ORAL_TABLET | Freq: Four times a day (QID) | ORAL | Status: DC | PRN
  Administered 2022-03-27 – 2022-03-31 (×3): 50 mg via ORAL
  Filled 2022-03-25 (×4): qty 1

## 2022-03-25 MED ORDER — FERROUS SULFATE 325 (65 FE) MG PO TABS
325.0000 mg | ORAL_TABLET | Freq: Every day | ORAL | Status: DC
  Administered 2022-03-26 – 2022-04-08 (×14): 325 mg via ORAL
  Filled 2022-03-25 (×14): qty 1

## 2022-03-25 MED ORDER — ATORVASTATIN CALCIUM 10 MG PO TABS
10.0000 mg | ORAL_TABLET | Freq: Every day | ORAL | Status: DC
  Administered 2022-03-25 – 2022-04-07 (×13): 10 mg via ORAL
  Filled 2022-03-25 (×13): qty 1

## 2022-03-25 MED ORDER — POTASSIUM CHLORIDE 10 MEQ/100ML IV SOLN
10.0000 meq | INTRAVENOUS | Status: AC
  Administered 2022-03-25 (×6): 10 meq via INTRAVENOUS
  Filled 2022-03-25 (×6): qty 100

## 2022-03-25 MED ORDER — SENNOSIDES-DOCUSATE SODIUM 8.6-50 MG PO TABS: 1.0000 | ORAL_TABLET | Freq: Every evening | ORAL | Status: DC | PRN

## 2022-03-25 MED ORDER — GUAIFENESIN 100 MG/5ML PO LIQD: 5.0000 mL | ORAL | Status: DC | PRN

## 2022-03-25 MED ORDER — IPRATROPIUM-ALBUTEROL 0.5-2.5 (3) MG/3ML IN SOLN: 3.0000 mL | RESPIRATORY_TRACT | Status: DC | PRN

## 2022-03-25 MED ORDER — MAGNESIUM SULFATE 4 GM/100ML IV SOLN
4.0000 g | Freq: Once | INTRAVENOUS | Status: AC
  Administered 2022-03-25: 4 g via INTRAVENOUS
  Filled 2022-03-25: qty 100

## 2022-03-25 MED ORDER — TRAZODONE HCL 50 MG PO TABS: 50.0000 mg | ORAL_TABLET | Freq: Every evening | ORAL | Status: DC | PRN

## 2022-03-25 MED ORDER — METOPROLOL TARTRATE 5 MG/5ML IV SOLN: 5.0000 mg | INTRAVENOUS | Status: DC | PRN

## 2022-03-25 NOTE — Evaluation (Signed)
Physical Therapy Evaluation Patient Details Name: Sarah Wu MRN: [id number] DOB: Oct 23, 1950 Today's Date: 03/25/2022  History of Present Illness  Patient is 72 y.o. female who was transferred [facility] from [facility] where she was treated for 2 days due to C. difficile colitis, electrolyte abnormalities, sinus tachycardia with QT prolongation that has now converted into atrial fibrillation with RVR. PMH significant for prediabetes, osteoarthritis, fibroid tumor, HLD, HTN, postmenopausal bleeding, peripheral neuropathy, stage IV uterine cancer with pancytopenia.  Clinical Impression  Pt admitted secondary to problem above with deficits below.  patient was extensive assist for all functional mobility, therapist arrived and nursing staff present to assist pt with hygiene tasks, 2 daughters present during evaluation today and able to provide insight to PLOF.  Pt currently requires TD x 2 for bed mobility tasks, min guard for sitting balance EOB with reports of dizziness and BP 111/72 and symptoms subsiding, 2 trials with STS from elevated EOB with TD x 2 and pull to stand at [facility], pt unable to demonstrate power up phase due to generalized weakness and multiple co-morbidities. Pt c/o buttock/sacral pain with stage III sacral wound, B knee pain with R> L.  Anticipate patient will benefit from PT to address problems listed below.Will continue to follow acutely to maximize functional mobility independence and safety.       Recommendations for follow up therapy are one component of a multi-disciplinary discharge planning process, led by the attending physician.  Recommendations may be updated based on patient status, additional functional criteria and insurance authorization.  Follow Up Recommendations Acute inpatient rehab (3hours/day)      Assistance Recommended at Discharge Frequent or constant Supervision/Assistance  Patient can return home with the following  Two people to help with  walking and/or transfers;Two people to help with bathing/dressing/bathroom;Assistance with cooking/housework;Direct supervision/assist for medications management;Assist for transportation;Help with stairs or ramp for entrance    Equipment Recommendations Other (comment) (none recommended at this time)  Recommendations for Other Services       Functional Status Assessment Patient has had a recent decline in their functional status and demonstrates the ability to make significant improvements in function in a reasonable and predictable amount of time.     Precautions / Restrictions Precautions Precautions: Fall;Other (comment) (enteric) Restrictions Weight Bearing Restrictions: No      Mobility  Bed Mobility Overal bed mobility: Needs Assistance Bed Mobility: Supine to Sit, Sit to Supine     Supine to sit: +2 for physical assistance, Total assist Sit to supine: +2 for physical assistance, Total assist   General bed mobility comments: pt encouraged to engage and use bed rails to assist    Transfers Overall transfer level: Needs assistance Equipment used: Rolling walker (2 wheels) Transfers: Sit to/from Stand Sit to Stand: +2 physical assistance, Total assist, From elevated surface           General transfer comment: unable to assist pt to upright standing wtih noted B LE MMT deficits and pt unable to complete power up with pull to stand at RW with cues and A x 2 from elevated surfaace. unable to safely perform SPT    Ambulation/Gait                  Stairs            Wheelchair Mobility    Modified Rankin (Stroke Patients Only)       Balance Overall balance assessment: Needs assistance Sitting-balance support: Feet supported, Single extremity supported (pt iniitally  required min A for sitting balance EOB and progressed to min guard) Sitting balance-Leahy Scale: Poor     Standing balance support: Reliant on assistive device for balance (unable to  progress to upright standing at eval) Standing balance-Leahy Scale: Zero                               Pertinent Vitals/Pain Pain Assessment Pain Assessment: Faces Pain Score: 4  Faces Pain Scale: Hurts little more Pain Location: B knee R > L, sacral and buttock region with stage II sacral wound Pain Descriptors / Indicators: Discomfort, Grimacing, Guarding Pain Intervention(s): Limited activity within patient's tolerance, Monitored during session, Repositioned    Home Living Family/patient expects to be discharged to:: Private residence Living Arrangements: Children Available Help at Discharge: Family Type of Home: House Home Access: Stairs to enter Entrance Stairs-Rails: Psychiatric nurse of Steps: 4   Home Layout: One level Home Equipment: Teaching laboratory technician (2 wheels);Cane - single point;Grab bars - toilet      Prior Function Prior Level of Function : Independent/Modified Independent             Mobility Comments: amb with SPC. daughter assists for medication management to ensure medications taken properly       Hand Dominance        Extremity/Trunk Assessment        Lower Extremity Assessment Lower Extremity Assessment: RLE deficits/detail;LLE deficits/detail RLE Deficits / Details: requires assist to move against gravity for all planes of movement with LEs with c/o R knee pain and indicated she is "missing" her knee cap RLE Sensation: WNL LLE Deficits / Details: requires assist to move against gravity for all planes of movement with LEs LLE Sensation: WNL    Cervical / Trunk Assessment Cervical / Trunk Assessment: Normal  Communication   Communication: No difficulties  Cognition Arousal/Alertness: Awake/alert Behavior During Therapy: WFL for tasks assessed/performed Overall Cognitive Status: Within Functional Limits for tasks assessed                                           General Comments      Exercises     Assessment/Plan    PT Assessment Patient needs continued PT services  PT Problem List Decreased strength;Decreased range of motion;Decreased activity tolerance;Decreased balance;Decreased mobility;Decreased coordination;Decreased knowledge of use of DME;Decreased skin integrity       PT Treatment Interventions DME instruction;Gait training;Stair training;Functional mobility training;Therapeutic activities;Therapeutic exercise;Balance training;Neuromuscular re-education;Patient/family education    PT Goals (Current goals can be found in the Care Plan section)  Acute Rehab PT Goals Patient Stated Goal: to get stonger and go home PT Goal Formulation: With patient Time For Goal Achievement: 04/08/22 Potential to Achieve Goals: Good    Frequency Min 3X/week     Co-evaluation               AM-PAC PT "6 Clicks" Mobility  Outcome Measure Help needed turning from your back to your side while in a flat bed without using bedrails?: A Lot Help needed moving from lying on your back to sitting on the side of a flat bed without using bedrails?: Total Help needed moving to and from a bed to a chair (including a wheelchair)?: Total Help needed standing up from a chair using your arms (e.g., wheelchair or bedside chair)?:  Total Help needed to walk in hospital room?: Total Help needed climbing 3-5 steps with a railing? : Total 6 Click Score: 7    End of Session Equipment Utilized During Treatment: Gait belt Activity Tolerance: Patient limited by fatigue Patient left: in bed;with family/visitor present;with nursing/sitter in room;with call bell/phone within reach Nurse Communication: Mobility status PT Visit Diagnosis: Unsteadiness on feet (R26.81);Other abnormalities of gait and mobility (R26.89);Muscle weakness (generalized) (M62.81);Difficulty in walking, not elsewhere classified (R26.2)    Time: [id number] PT Time Calculation (min) (ACUTE  ONLY): 41 min   Charges:   PT Evaluation $PT Eval Moderate Complexity: 1 Mod PT Treatments $Therapeutic Activity: 23-37 mins        Baird Lyons, PT   Adair Patter 03/25/2022, 12:24 PM

## 2022-03-25 NOTE — Progress Notes (Signed)
ANTICOAGULATION CONSULT NOTE - follow up  Pharmacy Consult for apixaban Indication: atrial fibrillation  Allergies  Allergen Reactions   Hydrocodone-Acetaminophen Swelling    Swells stomach   Oxycodone-Acetaminophen Swelling    Swells stomach    Patient Measurements: IBW: 64kg TWB 83.9kg Heparin Dosing Weight: 81 kg  Vital Signs: Temp: 99.7 F (37.6 C) (02/14 0340) Temp Source: Oral (02/14 0340) BP: 131/64 (02/14 0340) Pulse Rate: 90 (02/14 0340)  Labs: Recent Labs    03/23/22 0600 03/24/22 0330 03/25/22 0405  HGB 7.9* 7.3* 8.8*  HCT 24.9* 23.0* 27.6*  PLT 117* 101* 102*  CREATININE 1.03* 0.85 0.67     Estimated Creatinine Clearance: 71.4 mL/min (by C-G formula based on SCr of 0.67 mg/dL).  Assessment: Active Problem(s): Transfer from OSH on diltiazem infusion for afib  AC/Heme: In Afib. CHA2DS2-VASc score at least 3. Not on anticoag PTA. Started on IV heparin and now transitioned to apixaban.    Plan:  -Continue apixaban 5 mg po ID for Afib -Patient educated and provided with coupon -Pharmacy to sign off consult  Tawnya Crook, PharmD, BCPS Clinical Pharmacist 03/25/2022 7:56 AM

## 2022-03-25 NOTE — Progress Notes (Signed)
PROGRESS NOTE    Sarah Wu  [id number] DOB: [birth year] DOA: 03/21/2022 PCP: Levin Erp, DO   Brief Narrative:   72 y.o. female with medical history significant of prediabetes, osteoarthritis, fibroid tumor, hyperlipidemia, hypertension, postmenopausal bleeding, peripheral neuropathy, stage IV uterine cancer with pancytopenia who was transferred to this facility from [facility] where she has been treated for the past 2 days due to C. difficile colitis, electrolyte abnormalities, sinus tachycardia with QT prolongation that has now converted into atrial fibrillation with RVR . Cardiology consulted and patient started on IV amiodarone and IV heparin. She was transitioned to oral amiodarone and eliquis.    Assessment & Plan:  Principal Problem:   C. difficile colitis Active Problems:   Essential hypertension   Prediabetes   Pure hypercholesterolemia   Peripheral neuropathy due to chemotherapy (HCC)   Pressure injury of skin   Atrial fibrillation with RVR (HCC)   Malnutrition of moderate degree    Pancolitis secondary to C. difficile infection -Currently on Dificid, started on 2/10.  Symptomatic management, IV fluids as necessary.  Diet as tolerated.   Atrial fibrillation with RVR -Seen by cardiology.  Currently on amiodarone, p.o. metoprolol and anticoagulation. -TSH within normal limits   Prolonged QTc  Keep potassium greater than 4 and magnesium greater than 2 Monitor electrolytes    Hypokalemia/hypokalemia - As needed repletion  Abnormal right knee x-ray - Concerns of previous prosthetic loosening seen on x-ray at the outside hospital.  Discussed with [facility], Dr. Kathaleen Bury is requesting repeat x-ray so we can get proper imaging.  Appreciate his input   Mild thrombocytopenia Acquired pancytopenia -Continue to monitor.  No obvious evidence of gross bleeding.  Transfuse as necessary Status post PRBC transfusion 2/13 Hemoccult  positive-expected in setting of colitis   Stage 4 Uterine cancer S/p robotic assisted laparoscopic total hysterectomy with bilateral salpingo-oophorectomy,. S/p reduced dose of chemotherapy completed 6 cycles.  Follows up with Dr. Alvy Bimler Peripheral neuropathy from chemotherapy   DVT prophylaxis: Eliquis Code Status: Full code Family Communication: Daughter is present at bedside  Status is: Inpatient Remains inpatient appropriate because: Still having significant amount of diarrhea.   Nutritional status    Signs/Symptoms: mild fat depletion, mild muscle depletion  Interventions: Refer to RD note for recommendations, Magic cup, MVI  Body mass index is 27.56 kg/m.  Pressure Injury 03/21/22 Sacrum Mid Stage 3 -  Full thickness tissue loss. Subcutaneous fat may be visible but bone, tendon or muscle are NOT exposed. Red (Active)  03/21/22 0800  Location: Sacrum  Location Orientation: Mid  Staging: Stage 3 -  Full thickness tissue loss. Subcutaneous fat may be visible but bone, tendon or muscle are NOT exposed.  Wound Description (Comments): Red  Present on Admission: Yes        Subjective: Patient still having significant amount of diarrhea.  Overall feels quite weak.  Also having trouble bearing weight on the right knee   Examination:  General exam: Appears calm and comfortable  Respiratory system: Clear to auscultation. Respiratory effort normal. Cardiovascular system: S1 & S2 heard, RRR. No JVD, murmurs, rubs, gallops or clicks. No pedal edema. Gastrointestinal system: Abdomen is nondistended, soft and nontender. No organomegaly or masses felt. Normal bowel sounds heard. Central nervous system: Alert and oriented. No focal neurological deficits. Extremities: Symmetric 5 x 5 power. Skin: No rashes, lesions or ulcers Psychiatry: Judgement and insight appear normal. Mood & affect appropriate.   Right chest wall port in place External catheter Rectal  tube  Objective: Vitals:   03/24/22 1836 03/24/22 2035 03/24/22 2201 03/25/22 0340  BP: 119/66 118/68 123/67 131/64  Pulse: 97 97 93 90  Resp: 17     Temp: 98.4 F (36.9 C) 99 F (37.2 C)  99.7 F (37.6 C)  TempSrc: Oral Oral  Oral  SpO2: 96% 93%    Weight:      Height:        Intake/Output Summary (Last 24 hours) at 03/25/2022 0806 Last data filed at 03/24/2022 1830 Gross per 24 hour  Intake 1049.63 ml  Output 375 ml  Net 674.63 ml   Filed Weights   03/23/22 2016  Weight: 82.2 kg     Data Reviewed:   CBC: Recent Labs  Lab 03/21/22 0621 03/22/22 0235 03/23/22 0600 03/24/22 0330 03/25/22 0405  WBC 7.7 10.2 6.5 5.1 5.7  HGB 9.4* 9.1* 7.9* 7.3* 8.8*  HCT 29.8* 28.3* 24.9* 23.0* 27.6*  MCV 89.2 88.7 89.2 90.6 89.9  PLT 140* 149* 117* 101* [id number]*   Basic Metabolic Panel: Recent Labs  Lab 03/21/22 0621 03/22/22 0235 03/23/22 0600 03/23/22 1509 03/24/22 0330 03/25/22 0405  NA 137 135 133*  --  134* 134*  K 3.6 3.4* 2.9*  --  3.0* 3.0*  CL 99 96* 97*  --  99 98  CO2 22 23 23  $ --  24 23  GLUCOSE 113* 123* 100*  --  87 98  BUN 22 24* 24*  --  19 12  CREATININE 0.84 1.00 1.03*  --  0.85 0.67  CALCIUM 7.9* 8.0* 7.9*  --  7.7* 7.9*  MG 2.7*  --  2.0 1.6* 1.9 1.4*  PHOS 3.3  --  2.4*  --  2.8 3.4   GFR: Estimated Creatinine Clearance: 71.4 mL/min (by C-G formula based on SCr of 0.67 mg/dL). Liver Function Tests: Recent Labs  Lab 03/21/22 0621 03/22/22 0235  AST 37 29  ALT 23 22  ALKPHOS 62 66  BILITOT 1.6* 1.4*  PROT 6.2* 6.1*  ALBUMIN 2.2* 2.1*   No results for input(s): "LIPASE", "AMYLASE" in the last 168 hours. No results for input(s): "AMMONIA" in the last 168 hours. Coagulation Profile: No results for input(s): "INR", "PROTIME" in the last 168 hours. Cardiac Enzymes: No results for input(s): "CKTOTAL", "CKMB", "CKMBINDEX", "TROPONINI" in the last 168 hours. BNP (last 3 results) No results for input(s): "PROBNP" in the last 8760  hours. HbA1C: No results for input(s): "HGBA1C" in the last 72 hours. CBG: Recent Labs  Lab 03/24/22 1113 03/24/22 1644 03/24/22 2139 03/25/22 0323 03/25/22 0730  GLUCAP 93 80 77 72 80   Lipid Profile: No results for input(s): "CHOL", "HDL", "LDLCALC", "TRIG", "CHOLHDL", "LDLDIRECT" in the last 72 hours. Thyroid Function Tests: No results for input(s): "TSH", "T4TOTAL", "FREET4", "T3FREE", "THYROIDAB" in the last 72 hours. Anemia Panel: Recent Labs    03/24/22 1244  VITAMINB12 5,396*  FOLATE 12.4  FERRITIN 1,373*  TIBC 151*  IRON 36  RETICCTPCT 1.6   Sepsis Labs: No results for input(s): "PROCALCITON", "LATICACIDVEN" in the last 168 hours.  Recent Results (from the past 240 hour(s))  MRSA Next Gen by PCR, Nasal     Status: None   Collection Time: 03/21/22  6:04 AM   Specimen: Nasal Mucosa; Nasal Swab  Result Value Ref Range Status   MRSA by PCR Next Gen NOT DETECTED NOT DETECTED Final    Comment: (NOTE) The GeneXpert MRSA Assay (FDA approved for NASAL specimens only), is one component of a comprehensive MRSA  colonization surveillance program. It is not intended to diagnose MRSA infection nor to guide or monitor treatment for MRSA infections. Test performance is not FDA approved in patients less than 34 years old. Performed at [facility], [place] [address], [place], [place] [address]          Radiology Studies: No results found.      Scheduled Meds:  amiodarone  400 mg Oral BID   Followed by   Derrill Memo ON 03/30/2022] amiodarone  200 mg Oral BID   Followed by   Derrill Memo ON 04/06/2022] amiodarone  200 mg Oral Daily   apixaban  5 mg Oral BID   ascorbic acid  500 mg Oral Daily   Chlorhexidine Gluconate Cloth  6 each Topical Q0600   fidaxomicin  200 mg Oral BID   metoprolol tartrate  12.5 mg Oral BID   multivitamin with minerals  1 tablet Oral Daily   sodium chloride flush  10-40 mL Intracatheter Q12H   Continuous Infusions:  famotidine  (PEPCID) IV 20 mg (03/24/22 1402)     LOS: 3 days   Time spent= 35 mins    Irvan Tiedt Arsenio Loader, MD [facility]  If 7PM-7AM, please contact night-coverage  03/25/2022, 8:06 AM

## 2022-03-25 NOTE — Progress Notes (Signed)
Inpatient Rehab Admissions Coordinator:   Per therapy recommendations pt was screened for CIR by Shann Medal, PT, DPT.  At this time, pt not at a level to tolerate intensity of CIR program.  We can follow from a distance for progress, but would encourage investigation of other rehab options at this time.    Shann Medal, PT, DPT Admissions Coordinator [phone] 03/25/22  5:07 PM

## 2022-03-25 NOTE — Telephone Encounter (Signed)
Patient is still admitted in the hospital.

## 2022-03-25 NOTE — Care Management Important Message (Signed)
Important Message  Patient Details IM Letter given. Name: Shaka Taney MRN: [id number] Date of Birth: 12-11-1950   Medicare Important Message Given:  Yes     Kerin Salen 03/25/2022, 12:28 PM

## 2022-03-25 NOTE — Progress Notes (Signed)
Subjective:  Patient seen and examined at bedside, resting comfortably. No events overnight. She remains in sinus rhythm.  She denies chest pain, shortness of breath, palpitations.   Intake/Output from previous day:  I/O last 3 completed shifts: In: 853.4 [I.V.:804.2; IV Piggyback:49.2] Out: 300 [Urine:300] Total I/O In: 997.3 [I.V.:897.5; IV Piggyback:99.8] Out: 500 [Urine:500] Net IO Since Admission: 1,506.67 mL [03/23/22 1234]  Blood pressure 120/77, pulse (!) 103, temperature 98.9 F (37.2 C), temperature source Oral, resp. rate 16, SpO2 97 %. Physical Exam Constitutional:      Appearance: She is ill-appearing.  HENT:     Head: Normocephalic and atraumatic.  Cardiovascular:     Rate and Rhythm: Regular rate and rhythm.     Heart sounds: Normal heart sounds.  Pulmonary:     Effort: Pulmonary effort is normal.  Musculoskeletal:     Right lower leg: No edema.     Left lower leg: No edema.  Skin:    General: Skin is warm and dry.  Neurological:     Mental Status: She is alert.     Lab Results: Lab Results  Component Value Date   NA 133 (L) 03/23/2022   K 2.9 (L) 03/23/2022   CO2 23 03/23/2022   GLUCOSE 100 (H) 03/23/2022   BUN 24 (H) 03/23/2022   CREATININE 1.03 (H) 03/23/2022   CALCIUM 7.9 (L) 03/23/2022   GFRNONAA 58 (L) 03/23/2022    BNP (last 3 results) No results for input(s): "BNP" in the last 8760 hours.  ProBNP (last 3 results) No results for input(s): "PROBNP" in the last 8760 hours.    Latest Ref Rng & Units 03/23/2022    6:00 AM 03/22/2022    2:35 AM 03/21/2022    6:21 AM  BMP  Glucose 70 - 99 mg/dL 100  123  113   BUN 8 - 23 mg/dL 24  24  22   $ Creatinine 0.44 - 1.00 mg/dL 1.03  1.00  0.84   Sodium 135 - 145 mmol/L 133  135  137   Potassium 3.5 - 5.1 mmol/L 2.9  3.4  3.6   Chloride 98 - 111 mmol/L 97  96  99   CO2 22 - 32 mmol/L 23  23  22   $ Calcium 8.9 - 10.3 mg/dL 7.9  8.0  7.9       Latest Ref Rng & Units 03/22/2022    2:35 AM 03/21/2022     6:21 AM 03/12/2022    8:51 AM  Hepatic Function  Total Protein 6.5 - 8.1 g/dL 6.1  6.2  7.1   Albumin 3.5 - 5.0 g/dL 2.1  2.2  3.6   AST 15 - 41 U/L 29  37  15   ALT 0 - 44 U/L 22  23  8   $ Alk Phosphatase 38 - 126 U/L 66  62  54   Total Bilirubin 0.3 - 1.2 mg/dL 1.4  1.6  0.9       Latest Ref Rng & Units 03/23/2022    6:00 AM 03/22/2022    2:35 AM 03/21/2022    6:21 AM  CBC  WBC 4.0 - 10.5 K/uL 6.5  10.2  7.7   Hemoglobin 12.0 - 15.0 g/dL 7.9  9.1  9.4   Hematocrit 36.0 - 46.0 % 24.9  28.3  29.8   Platelets 150 - 400 K/uL 117  149  140    Lipid Panel  No results found for: "CHOL", "TRIG", "HDL", "CHOLHDL", "VLDL", "LDLCALC", "LDLDIRECT" Cardiac  Panel (last 3 results) No results for input(s): "CKTOTAL", "CKMB", "TROPONINI", "RELINDX" in the last 72 hours.  HEMOGLOBIN A1C Lab Results  Component Value Date   HGBA1C 5.4 12/30/2021   MPG 108.28 12/30/2021   TSH Recent Labs    02/19/22 0841 03/12/22 0851 03/21/22 1518  TSH 0.532 0.274* 2.015    Imaging: Imaging results have been reviewed and CT ABDOMEN PELVIS W CONTRAST  Result Date: 03/22/2022 CLINICAL DATA:  Acute non localized abdominal pain. Prior uterine carcinoma. * Tracking Code: BO * EXAM: CT ABDOMEN AND PELVIS WITH CONTRAST TECHNIQUE: Multidetector CT imaging of the abdomen and pelvis was performed using the standard protocol following bolus administration of intravenous contrast. RADIATION DOSE REDUCTION: This exam was performed according to the departmental dose-optimization program which includes automated exposure control, adjustment of the mA and/or kV according to patient size and/or use of iterative reconstruction technique. CONTRAST:  159m OMNIPAQUE IOHEXOL 300 MG/ML  SOLN COMPARISON:  11/21/2021 FINDINGS: Lower Chest: New bilateral lower lobe atelectasis or consolidation and small bilateral pleural effusions. Hepatobiliary: No hepatic masses identified. Gallstones are seen, however there is no evidence of  cholecystitis or biliary dilatation. Pancreas:  No mass or inflammatory changes. Spleen: Within normal limits in size and appearance. Adrenals/Urinary Tract: No suspicious masses identified. No evidence of ureteral calculi or hydronephrosis. Stomach/Bowel: Diffuse small bowel and colonic dilatation is seen, consistent with ileus. Mild diffuse wall thickening is seen throughout the colon, consistent with diffuse colitis. No evidence of small bowel dilatation or wall thickening. Normal appendix visualized. No evidence of abscess or free fluid. Vascular/Lymphatic: No pathologically enlarged lymph nodes. No acute vascular findings. Reproductive: Prior hysterectomy noted. Adnexal regions are unremarkable in appearance. Other:  None. Musculoskeletal:  No suspicious bone lesions identified. IMPRESSION: Mild diffuse colitis. Adynamic ileus. No evidence of recurrent or metastatic carcinoma. Cholelithiasis. No radiographic evidence of cholecystitis. New bilateral lower lobe atelectasis or consolidation and small bilateral pleural effusions. Electronically Signed   By: JMarlaine HindM.D.   On: 03/22/2022 15:54   DG Abd 1 View  Result Date: 03/22/2022 CLINICAL DATA:  Abdominal distension.  History of C diff. EXAM: ABDOMEN - 1 VIEW COMPARISON:  CT scan 11/21/2021 FINDINGS: Diffuse gaseous distension of small bowel and colon noted with gas visible in the descending colon to the level of the sigmoid segment. Mild gaseous distention of the stomach is associated. IMPRESSION: Diffuse gaseous distension of small bowel and colon. Imaging features more suggestive of ileus than obstruction at this time. Electronically Signed   By: EMisty StanleyM.D.   On: 03/22/2022 10:29   ECHOCARDIOGRAM COMPLETE  Result Date: 03/22/2022    ECHOCARDIOGRAM REPORT   Patient Name:   MZHARIAH ZALESDate of Exam: 03/22/2022 Medical Rec #:  [id number]         Height:       68.0 in Accession #:    [id number]        Weight:       185.0 lb Date of  Birth:  2November 06, 1952         BSA:          1.977 m Patient Age:    89years           BP:           113/76 mmHg Patient Gender: F                  HR:  106 bpm. Exam Location:  Inpatient Procedure: 2D Echo, Cardiac Doppler and Color Doppler Indications:    I48.9 Atrial Fibrillation  History:        Patient has no prior history of Echocardiogram examinations.                 C-Diff; uterine cancer; Risk Factors:Hypertension, Dyslipidemia                 and Non-Smoker.  Sonographer:    Wilkie Aye RVT RCS Referring Phys: [id number] [place]  Sonographer Comments: No subcostal window, Technically challenging study due to limited acoustic windows, suboptimal parasternal window and suboptimal apical window. Image acquisition challenging due to respiratory motion and exam done with patient in supine position. IMPRESSIONS  1. Left ventricular ejection fraction, by estimation, is 65 to 70%. The left ventricle has normal function. The left ventricle has no regional wall motion abnormalities. There is mild left ventricular hypertrophy. Left ventricular diastolic parameters are consistent with Grade I diastolic dysfunction (impaired relaxation).  2. Right ventricular systolic function is normal. The right ventricular size is normal. Tricuspid regurgitation signal is inadequate for assessing PA pressure.  3. The mitral valve is grossly normal. Trivial mitral valve regurgitation. No evidence of mitral stenosis.  4. The aortic valve is grossly normal. Aortic valve regurgitation is not visualized. No aortic stenosis is present. FINDINGS  Left Ventricle: Left ventricular ejection fraction, by estimation, is 65 to 70%. The left ventricle has normal function. The left ventricle has no regional wall motion abnormalities. The left ventricular internal cavity size was normal in size. There is  mild left ventricular hypertrophy. Left ventricular diastolic parameters are consistent with Grade I diastolic dysfunction (impaired  relaxation). Right Ventricle: The right ventricular size is normal. No increase in right ventricular wall thickness. Right ventricular systolic function is normal. Tricuspid regurgitation signal is inadequate for assessing PA pressure. Left Atrium: Left atrial size was normal in size. Right Atrium: Right atrial size was normal in size. Pericardium: There is no evidence of pericardial effusion. Mitral Valve: The mitral valve is grossly normal. Trivial mitral valve regurgitation. No evidence of mitral valve stenosis. Tricuspid Valve: The tricuspid valve is normal in structure. Tricuspid valve regurgitation is not demonstrated. No evidence of tricuspid stenosis. Aortic Valve: The aortic valve is grossly normal. Aortic valve regurgitation is not visualized. No aortic stenosis is present. Aortic valve mean gradient measures 3.0 mmHg. Aortic valve peak gradient measures 4.9 mmHg. Aortic valve area, by VTI measures 2.92 cm. Pulmonic Valve: The pulmonic valve was normal in structure. Pulmonic valve regurgitation is trivial. No evidence of pulmonic stenosis. Aorta: The aortic root is normal in size and structure. Venous: The inferior vena cava was not well visualized. IAS/Shunts: The interatrial septum was not well visualized.  LEFT VENTRICLE PLAX 2D LVIDd:         2.90 cm   Diastology LVIDs:         2.10 cm   LV e' medial:    7.36 cm/s LV PW:         1.10 cm   LV E/e' medial:  7.2 LV IVS:        1.10 cm   LV e' lateral:   6.48 cm/s LVOT diam:     2.00 cm   LV E/e' lateral: 8.2 LV SV:         38 LV SV Index:   19 LVOT Area:     3.14 cm  RIGHT VENTRICLE RV S prime:  14.50 cm/s TAPSE (M-mode): 1.5 cm LEFT ATRIUM             Index LA diam:        3.10 cm 1.57 cm/m LA Vol (A2C):   18.5 ml 9.36 ml/m LA Vol (A4C):   25.0 ml 12.64 ml/m LA Biplane Vol: 22.5 ml 11.38 ml/m  AORTIC VALVE                    PULMONIC VALVE AV Area (Vmax):    2.80 cm     PV Vmax:       1.25 m/s AV Area (Vmean):   2.78 cm     PV Peak grad:  6.2  mmHg AV Area (VTI):     2.92 cm AV Vmax:           111.00 cm/s AV Vmean:          75.700 cm/s AV VTI:            0.130 m AV Peak Grad:      4.9 mmHg AV Mean Grad:      3.0 mmHg LVOT Vmax:         99.00 cm/s LVOT Vmean:        67.100 cm/s LVOT VTI:          0.121 m LVOT/AV VTI ratio: 0.93  AORTA Ao Root diam: 2.60 cm Ao Asc diam:  2.70 cm MITRAL VALVE MV Area (PHT): 3.46 cm    SHUNTS MV Decel Time: 219 msec    Systemic VTI:  0.12 m MV E velocity: 52.90 cm/s  Systemic Diam: 2.00 cm MV A velocity: 81.00 cm/s MV E/A ratio:  0.65 Cherlynn Kaiser MD Electronically signed by Cherlynn Kaiser MD Signature Date/Time: 03/22/2022/9:42:10 AM    Final     Cardiac Studies:  EKG: 03/21/2022: Afib RVR with ST-T wave abnormality suspect due to LVH.  03/22/2022 telemetry: sinus tachycardia, rates low 100s  Recent Results (from the past 43800 hour(s))  ECHOCARDIOGRAM COMPLETE   Collection Time: 03/22/22  9:07 AM  Result Value   BP 119/79   S' Lateral 2.10   AR max vel 2.80   AV Area VTI 2.92   AV Mean grad 3.0   AV Peak grad 4.9   Ao pk vel 1.11   Area-P 1/2 3.46   AV Area mean vel 2.78   Est EF 65 - 70%   Narrative      ECHOCARDIOGRAM REPORT       Patient Name:   Annamarie Major Date of Exam: 03/22/2022 Medical Rec #:  [id number]          Height:       68.0 in Accession #:    [id number]         Weight:       185.0 lb Date of Birth:  [birth year]          BSA:          1.977 m Patient Age:    72 years           BP:           113/76 mmHg Patient Gender: F                  HR:           106 bpm. Exam Location:  Inpatient  Procedure: 2D Echo, Cardiac Doppler and Color Doppler  Indications:    I48.9 Atrial Fibrillation   History:  Patient has no prior history of Echocardiogram examinations.                 C-Diff; uterine cancer; Risk Factors:Hypertension, Dyslipidemia                 and Non-Smoker.   Sonographer:    Wilkie Aye RVT RCS Referring Phys: [id number] [place]     Sonographer Comments: No subcostal window, Technically challenging study due to limited acoustic windows, suboptimal parasternal window and suboptimal apical window. Image acquisition challenging due to respiratory motion and exam done with patient in  supine position. IMPRESSIONS    1. Left ventricular ejection fraction, by estimation, is 65 to 70%. The left ventricle has normal function. The left ventricle has no regional wall motion abnormalities. There is mild left ventricular hypertrophy. Left ventricular diastolic parameters  are consistent with Grade I diastolic dysfunction (impaired relaxation).  2. Right ventricular systolic function is normal. The right ventricular size is normal. Tricuspid regurgitation signal is inadequate for assessing PA pressure.  3. The mitral valve is grossly normal. Trivial mitral valve regurgitation. No evidence of mitral stenosis.  4. The aortic valve is grossly normal. Aortic valve regurgitation is not visualized. No aortic stenosis is present.  FINDINGS  Left Ventricle: Left ventricular ejection fraction, by estimation, is 65 to 70%. The left ventricle has normal function. The left ventricle has no regional wall motion abnormalities. The left ventricular internal cavity size was normal in size. There is  mild left ventricular hypertrophy. Left ventricular diastolic parameters are consistent with Grade I diastolic dysfunction (impaired relaxation).  Right Ventricle: The right ventricular size is normal. No increase in right ventricular wall thickness. Right ventricular systolic function is normal. Tricuspid regurgitation signal is inadequate for assessing PA pressure.  Left Atrium: Left atrial size was normal in size.  Right Atrium: Right atrial size was normal in size.  Pericardium: There is no evidence of pericardial effusion.  Mitral Valve: The mitral valve is grossly normal. Trivial mitral valve regurgitation. No evidence of mitral valve  stenosis.  Tricuspid Valve: The tricuspid valve is normal in structure. Tricuspid valve regurgitation is not demonstrated. No evidence of tricuspid stenosis.  Aortic Valve: The aortic valve is grossly normal. Aortic valve regurgitation is not visualized. No aortic stenosis is present. Aortic valve mean gradient measures 3.0 mmHg. Aortic valve peak gradient measures 4.9 mmHg. Aortic valve area, by VTI measures  2.92 cm.  Pulmonic Valve: The pulmonic valve was normal in structure. Pulmonic valve regurgitation is trivial. No evidence of pulmonic stenosis.  Aorta: The aortic root is normal in size and structure.  Venous: The inferior vena cava was not well visualized.  IAS/Shunts: The interatrial septum was not well visualized.    LEFT VENTRICLE PLAX 2D LVIDd:         2.90 cm   Diastology LVIDs:         2.10 cm   LV e' medial:    7.36 cm/s LV PW:         1.10 cm   LV E/e' medial:  7.2 LV IVS:        1.10 cm   LV e' lateral:   6.48 cm/s LVOT diam:     2.00 cm   LV E/e' lateral: 8.2 LV SV:         38 LV SV Index:   19 LVOT Area:     3.14 cm    RIGHT VENTRICLE RV S prime:     14.50  cm/s TAPSE (M-mode): 1.5 cm  LEFT ATRIUM             Index LA diam:        3.10 cm 1.57 cm/m LA Vol (A2C):   18.5 ml 9.36 ml/m LA Vol (A4C):   25.0 ml 12.64 ml/m LA Biplane Vol: 22.5 ml 11.38 ml/m  AORTIC VALVE                    PULMONIC VALVE AV Area (Vmax):    2.80 cm     PV Vmax:       1.25 m/s AV Area (Vmean):   2.78 cm     PV Peak grad:  6.2 mmHg AV Area (VTI):     2.92 cm AV Vmax:           111.00 cm/s AV Vmean:          75.700 cm/s AV VTI:            0.130 m AV Peak Grad:      4.9 mmHg AV Mean Grad:      3.0 mmHg LVOT Vmax:         99.00 cm/s LVOT Vmean:        67.100 cm/s LVOT VTI:          0.121 m LVOT/AV VTI ratio: 0.93   AORTA Ao Root diam: 2.60 cm Ao Asc diam:  2.70 cm  MITRAL VALVE MV Area (PHT): 3.46 cm    SHUNTS MV Decel Time: 219 msec    Systemic VTI:  0.12 m MV  E velocity: 52.90 cm/s  Systemic Diam: 2.00 cm MV A velocity: 81.00 cm/s MV E/A ratio:  0.65  Cherlynn Kaiser MD Electronically signed by Cherlynn Kaiser MD Signature Date/Time: 03/22/2022/9:42:10 AM       Final     *Note: Due to a large number of results and/or encounters for the requested time period, some results have not been displayed. A complete set of results can be found in Results Review.    Scheduled Meds:  amiodarone  400 mg Oral BID   Followed by   Derrill Memo ON 03/30/2022] amiodarone  200 mg Oral BID   Followed by   Derrill Memo ON 04/06/2022] amiodarone  200 mg Oral Daily   apixaban  5 mg Oral BID   Chlorhexidine Gluconate Cloth  6 each Topical Q0600   fidaxomicin  200 mg Oral BID   metoprolol tartrate  12.5 mg Oral BID   sodium chloride flush  10-40 mL Intracatheter Q12H   Continuous Infusions:  famotidine (PEPCID) IV Stopped (03/22/22 1419)   lactated ringers Stopped (03/23/22 0927)   potassium chloride     potassium chloride 10 mEq (03/23/22 1229)   PRN Meds:.acetaminophen **OR** acetaminophen, hydrOXYzine, ketorolac, metoprolol tartrate, ondansetron (ZOFRAN) IV, prochlorperazine, sodium chloride flush  Assessment  Quintella Brozowski is a 72 y.o. female patient with new onset atrial fibrillation, c.diff infection, and cancer.  New onset Afib RVR, CHA2DS2VASc = 3 Continue PO Eliquis. Continue 400 mg PO BID today. Patient has f/u with me in office scheduled. Cardiology will sign off. Dosing of amio: 400 mg PO BID to be taken for 1 week. Amio 200 mg BID for 1 week after that and amio 200 mg qDay thereafter. Lopressor 5 mg IVP q70mn x3 doses for sustained RVR. Can dig load if develops RVR with activity: dig 500 mcg x1 then 6 hours later 250 mcg, 6 hours later another 250 mcg, next day 125  mcg for maintenance. Echocardiogram reviewed, LVEF normal. Optimize lytes, keep K>4 and Mag>2.     Low TSH STAT thyroid panel ordered: TSH, FT4/T3. TSH now normalized, less likely  this is thyroid related.   Please call or text me directly with any questions or concerns [phone].    Floydene Flock, DO, [facility] 03/23/2022, 12:34 PM Office: [phone] Fax: [phone] Pager: [phone]

## 2022-03-26 ENCOUNTER — Ambulatory Visit: Payer: Medicare Other | Admitting: Radiation Oncology

## 2022-03-26 ENCOUNTER — Other Ambulatory Visit: Payer: Self-pay

## 2022-03-26 DIAGNOSIS — A0472 Enterocolitis due to Clostridium difficile, not specified as recurrent: Secondary | ICD-10-CM | POA: Diagnosis not present

## 2022-03-26 LAB — CBC
HCT: 27.4 % — ABNORMAL LOW (ref 36.0–46.0)
Hemoglobin: 8.7 g/dL — ABNORMAL LOW (ref 12.0–15.0)
MCH: 28.7 pg (ref 26.0–34.0)
MCHC: 31.8 g/dL (ref 30.0–36.0)
MCV: 90.4 fL (ref 80.0–100.0)
Platelets: 104 10*3/uL — ABNORMAL LOW (ref 150–400)
RBC: 3.03 MIL/uL — ABNORMAL LOW (ref 3.87–5.11)
RDW: 17.7 % — ABNORMAL HIGH (ref 11.5–15.5)
WBC: 4.4 10*3/uL (ref 4.0–10.5)
nRBC: 0.7 % — ABNORMAL HIGH (ref 0.0–0.2)

## 2022-03-26 LAB — GLUCOSE, CAPILLARY
Glucose-Capillary: 76 mg/dL (ref 70–99)
Glucose-Capillary: 90 mg/dL (ref 70–99)

## 2022-03-26 LAB — BASIC METABOLIC PANEL WITH GFR
Anion gap: 10 (ref 5–15)
BUN: 9 mg/dL (ref 8–23)
CO2: 24 mmol/L (ref 22–32)
Calcium: 7.6 mg/dL — ABNORMAL LOW (ref 8.9–10.3)
Chloride: 98 mmol/L (ref 98–111)
Creatinine, Ser: 0.54 mg/dL (ref 0.44–1.00)
GFR, Estimated: >60 mL/min
Glucose, Bld: 83 mg/dL (ref 70–99)
Potassium: 3 mmol/L — ABNORMAL LOW (ref 3.5–5.1)
Sodium: 132 mmol/L — ABNORMAL LOW (ref 135–145)

## 2022-03-26 LAB — MAGNESIUM: Magnesium: 1.5 mg/dL — ABNORMAL LOW (ref 1.7–2.4)

## 2022-03-26 LAB — PHOSPHORUS: Phosphorus: 3.7 mg/dL (ref 2.5–4.6)

## 2022-03-26 MED ORDER — MAGNESIUM SULFATE 4 GM/100ML IV SOLN
4.0000 g | Freq: Once | INTRAVENOUS | Status: AC
  Administered 2022-03-26: 4 g via INTRAVENOUS
  Filled 2022-03-26: qty 100

## 2022-03-26 MED ORDER — SIMETHICONE 80 MG PO CHEW: 80.0000 mg | CHEWABLE_TABLET | Freq: Four times a day (QID) | ORAL | Status: DC | PRN

## 2022-03-26 MED ORDER — POTASSIUM CHLORIDE 10 MEQ/100ML IV SOLN
10.0000 meq | INTRAVENOUS | Status: AC
  Administered 2022-03-26 (×6): 10 meq via INTRAVENOUS
  Filled 2022-03-26 (×6): qty 100

## 2022-03-26 MED ORDER — ORAL CARE MOUTH RINSE: 15.0000 mL | OROMUCOSAL | Status: DC | PRN

## 2022-03-26 NOTE — Progress Notes (Signed)
Sarah Wu   DOB:Feb 28, 1950   [id number]    ASSESSMENT & PLAN:  Uterine cancer She has completed adjuvant treatment Her treatment will be placed on hold to allow recovery from her current hospitalization I have informed the GYN navigator to update radiation oncologist to also put her radiation therapy on hold We will focus on supportive care Her CT imaging show no evidence of disease   C. difficile colitis Continue medical management   Acquired pancytopenia Due to recent chemotherapy This is much improved after blood transfusion   Electrolyte imbalance Due to colitis Replace as needed   Transient atrial fibrillation Due to her illness Agree with anticoagulation therapy Monitor closely for signs of bleeding   Moderate protein calorie malnutrition Appreciate dietitian consult  Knee pain status post fall X-ray result was reviewed Recommend orthopedic consult   Discharge planning Anticipate she will be here for the next 3 to 5 days I will return to check on her next week if she is still hospitalized  All questions were answered. The patient knows to call the clinic with any problems, questions or concerns.   The total time spent in the appointment was 30 minutes encounter with patients including review of chart and various tests results, discussions about plan of care and coordination of care plan  Heath Lark, MD 03/26/2022 9:38 AM  Subjective:  She is seen by the bedside.  Family member by the bedside.  She continues to have intermittent abdominal bloating when she tries to eat.  Her rectal tube was working yesterday.  She has not been able to exercise due to knee pain.  X-ray was done yesterday. The patient denies any recent signs or symptoms of bleeding such as spontaneous epistaxis, hematuria or hematochezia.   Objective:  Vitals:   03/25/22 2109 03/26/22 0606  BP: 113/68 116/65  Pulse: 92 94  Resp: 18 16  Temp: 98.7 F (37.1 C) 98.6 F (37 C)  SpO2:  97% 95%     Intake/Output Summary (Last 24 hours) at 03/26/2022 [id number] Last data filed at 03/26/2022 0342 Gross per 24 hour  Intake 298.96 ml  Output 1600 ml  Net -1301.04 ml    GENERAL:alert, no distress and comfortable NEURO: alert & oriented x 3 with fluent speech, no focal motor/sensory deficits   Labs:  Recent Labs    03/12/22 0851 03/21/22 0621 03/22/22 0235 03/23/22 0600 03/24/22 0330 03/25/22 0405 03/26/22 0400  NA 140 137 135   < > 134* 134* 132*  K 2.9* 3.6 3.4*   < > 3.0* 3.0* 3.0*  CL 98 99 96*   < > 99 98 98  CO2 27 22 23   $ < > 24 23 24  $ GLUCOSE 111* 113* 123*   < > 87 98 83  BUN 10 22 24*   < > 19 12 9  $ CREATININE 0.58 0.84 1.00   < > 0.85 0.67 0.54  CALCIUM 9.2 7.9* 8.0*   < > 7.7* 7.9* 7.6*  GFRNONAA >60 >60 60*   < > >60 >60 >60  PROT 7.1 6.2* 6.1*  --   --   --   --   ALBUMIN 3.6 2.2* 2.1*  --   --   --   --   AST 15 37 29  --   --   --   --   ALT 8 23 22  $ --   --   --   --   ALKPHOS 54 62 66  --   --   --   --  BILITOT 0.9 1.6* 1.4*  --   --   --   --    < > = values in this interval not displayed.    Studies:  CT KNEE RIGHT WO CONTRAST  Result Date: 03/25/2022 CLINICAL DATA:  Loose body, knee. Probable loosening and subsidence of the femoral component. EXAM: CT OF THE RIGHT KNEE WITHOUT CONTRAST TECHNIQUE: Multidetector CT imaging of the right knee was performed according to the standard protocol. Multiplanar CT image reconstructions were also generated. RADIATION DOSE REDUCTION: This exam was performed according to the departmental dose-optimization program which includes automated exposure control, adjustment of the mA and/or kV according to patient size and/or use of iterative reconstruction technique. COMPARISON:  Radiographs same date.  No other comparison studies. FINDINGS: Bones/Joint/Cartilage Status post right total knee arthroplasty (bilateral total knee arthroplasty on the scout image). As seen on the earlier radiographs, there is an abnormal  orientation of the femoral component relative to the distal femur. There is some lucency between the femur and the femoral component which could indicate loosening. The femoral component also appears posterolaterally displaced relative to the distal femoral metaphysis. On the coronal images, there is cortical step-off along the lateral femoral epicondyle (see coronal image 68/6). These findings may be chronic and related to an old partially healed fracture. Bone detail is limited by artifact from the arthroplasty, and an acute fracture cannot be completely excluded in the appropriate clinical context (such as recent fall or acute pain). The tibial component appears intact without loosening. The proximal tibia, fibula and patella appear intact. The bones are diffusely demineralized. No significant joint effusion identified. Ligaments Suboptimally assessed by CT. Muscles and Tendons Generalized muscular atrophy. The extensor mechanism appears intact, although the patellar tendon is partly obscured by artifact. No evidence of intramuscular hematoma or other focal fluid collection. Soft tissues Generalized subcutaneous edema without evidence of focal fluid collection, unexpected foreign body or soft tissue emphysema. IMPRESSION: 1. Abnormal orientation of the femoral component of the right total knee arthroplasty as seen on earlier radiographs. The femoral component appears posterolaterally displaced relative to the distal femoral metaphysis and there is possible loosening. 2. Cortical step-off along the lateral femoral epicondyle may be chronic and related to an old partially healed fracture. Bone detail is limited by artifact from the arthroplasty, and an acute fracture cannot be completely excluded in the appropriate clinical context. 3. The tibial component appears intact without loosening. 4. Generalized muscular atrophy. 5. Generalized subcutaneous edema without evidence of focal fluid collection, unexpected  foreign body or soft tissue emphysema. Electronically Signed   By: Richardean Sale M.D.   On: 03/25/2022 18:15   DG Knee Complete 4 Views Right  Result Date: 03/25/2022 CLINICAL DATA:  Previous knee replacement with pain EXAM: RIGHT KNEE - COMPLETE 4+ VIEW COMPARISON:  None Available. FINDINGS: Patient has had previous total knee arthroplasty. No abnormality seen relating to the tibial component. Probable loosening and subsidence of the femoral component, possibly with intra-articular fragmentation. Patellar component is not well seen but may also be abnormal. Joint effusion is present. IMPRESSION: Previous total knee arthroplasty. Probable loosening and subsidence of the femoral component, possibly with intra-articular fragmentation. Patellar component is not well seen but may also be abnormal. Joint effusion. Electronically Signed   By: Nelson Chimes M.D.   On: 03/25/2022 13:55   CT ABDOMEN PELVIS W CONTRAST  Result Date: 03/22/2022 CLINICAL DATA:  Acute non localized abdominal pain. Prior uterine carcinoma. * Tracking Code: BO * EXAM: CT ABDOMEN  AND PELVIS WITH CONTRAST TECHNIQUE: Multidetector CT imaging of the abdomen and pelvis was performed using the standard protocol following bolus administration of intravenous contrast. RADIATION DOSE REDUCTION: This exam was performed according to the departmental dose-optimization program which includes automated exposure control, adjustment of the mA and/or kV according to patient size and/or use of iterative reconstruction technique. CONTRAST:  163m OMNIPAQUE IOHEXOL 300 MG/ML  SOLN COMPARISON:  11/21/2021 FINDINGS: Lower Chest: New bilateral lower lobe atelectasis or consolidation and small bilateral pleural effusions. Hepatobiliary: No hepatic masses identified. Gallstones are seen, however there is no evidence of cholecystitis or biliary dilatation. Pancreas:  No mass or inflammatory changes. Spleen: Within normal limits in size and appearance.  Adrenals/Urinary Tract: No suspicious masses identified. No evidence of ureteral calculi or hydronephrosis. Stomach/Bowel: Diffuse small bowel and colonic dilatation is seen, consistent with ileus. Mild diffuse wall thickening is seen throughout the colon, consistent with diffuse colitis. No evidence of small bowel dilatation or wall thickening. Normal appendix visualized. No evidence of abscess or free fluid. Vascular/Lymphatic: No pathologically enlarged lymph nodes. No acute vascular findings. Reproductive: Prior hysterectomy noted. Adnexal regions are unremarkable in appearance. Other:  None. Musculoskeletal:  No suspicious bone lesions identified. IMPRESSION: Mild diffuse colitis. Adynamic ileus. No evidence of recurrent or metastatic carcinoma. Cholelithiasis. No radiographic evidence of cholecystitis. New bilateral lower lobe atelectasis or consolidation and small bilateral pleural effusions. Electronically Signed   By: JMarlaine HindM.D.   On: 03/22/2022 15:54   DG Abd 1 View  Result Date: 03/22/2022 CLINICAL DATA:  Abdominal distension.  History of C diff. EXAM: ABDOMEN - 1 VIEW COMPARISON:  CT scan 11/21/2021 FINDINGS: Diffuse gaseous distension of small bowel and colon noted with gas visible in the descending colon to the level of the sigmoid segment. Mild gaseous distention of the stomach is associated. IMPRESSION: Diffuse gaseous distension of small bowel and colon. Imaging features more suggestive of ileus than obstruction at this time. Electronically Signed   By: EMisty StanleyM.D.   On: 03/22/2022 10:29   ECHOCARDIOGRAM COMPLETE  Result Date: 03/22/2022    ECHOCARDIOGRAM REPORT   Patient Name:   Sarah SPINLERDate of Exam: 03/22/2022 Medical Rec #:  [id number]         Height:       68.0 in Accession #:    [id number]        Weight:       185.0 lb Date of Birth:  207/09/1950         BSA:          1.977 m Patient Age:    89years           BP:           113/76 mmHg Patient Gender: F                   HR:           106 bpm. Exam Location:  Inpatient Procedure: 2D Echo, Cardiac Doppler and Color Doppler Indications:    I48.9 Atrial Fibrillation  History:        Patient has no prior history of Echocardiogram examinations.                 C-Diff; uterine cancer; Risk Factors:Hypertension, Dyslipidemia                 and Non-Smoker.  Sonographer:    GWilkie AyeRVT RCS Referring Phys: [id number]  Sonographer Comments: No subcostal window, Technically challenging study due to limited acoustic windows, suboptimal parasternal window and suboptimal apical window. Image acquisition challenging due to respiratory motion and exam done with patient in supine position. IMPRESSIONS  1. Left ventricular ejection fraction, by estimation, is 65 to 70%. The left ventricle has normal function. The left ventricle has no regional wall motion abnormalities. There is mild left ventricular hypertrophy. Left ventricular diastolic parameters are consistent with Grade I diastolic dysfunction (impaired relaxation).  2. Right ventricular systolic function is normal. The right ventricular size is normal. Tricuspid regurgitation signal is inadequate for assessing PA pressure.  3. The mitral valve is grossly normal. Trivial mitral valve regurgitation. No evidence of mitral stenosis.  4. The aortic valve is grossly normal. Aortic valve regurgitation is not visualized. No aortic stenosis is present. FINDINGS  Left Ventricle: Left ventricular ejection fraction, by estimation, is 65 to 70%. The left ventricle has normal function. The left ventricle has no regional wall motion abnormalities. The left ventricular internal cavity size was normal in size. There is  mild left ventricular hypertrophy. Left ventricular diastolic parameters are consistent with Grade I diastolic dysfunction (impaired relaxation). Right Ventricle: The right ventricular size is normal. No increase in right ventricular wall thickness. Right ventricular  systolic function is normal. Tricuspid regurgitation signal is inadequate for assessing PA pressure. Left Atrium: Left atrial size was normal in size. Right Atrium: Right atrial size was normal in size. Pericardium: There is no evidence of pericardial effusion. Mitral Valve: The mitral valve is grossly normal. Trivial mitral valve regurgitation. No evidence of mitral valve stenosis. Tricuspid Valve: The tricuspid valve is normal in structure. Tricuspid valve regurgitation is not demonstrated. No evidence of tricuspid stenosis. Aortic Valve: The aortic valve is grossly normal. Aortic valve regurgitation is not visualized. No aortic stenosis is present. Aortic valve mean gradient measures 3.0 mmHg. Aortic valve peak gradient measures 4.9 mmHg. Aortic valve area, by VTI measures 2.92 cm. Pulmonic Valve: The pulmonic valve was normal in structure. Pulmonic valve regurgitation is trivial. No evidence of pulmonic stenosis. Aorta: The aortic root is normal in size and structure. Venous: The inferior vena cava was not well visualized. IAS/Shunts: The interatrial septum was not well visualized.  LEFT VENTRICLE PLAX 2D LVIDd:         2.90 cm   Diastology LVIDs:         2.10 cm   LV e' medial:    7.36 cm/s LV PW:         1.10 cm   LV E/e' medial:  7.2 LV IVS:        1.10 cm   LV e' lateral:   6.48 cm/s LVOT diam:     2.00 cm   LV E/e' lateral: 8.2 LV SV:         38 LV SV Index:   19 LVOT Area:     3.14 cm  RIGHT VENTRICLE RV S prime:     14.50 cm/s TAPSE (M-mode): 1.5 cm LEFT ATRIUM             Index LA diam:        3.10 cm 1.57 cm/m LA Vol (A2C):   18.5 ml 9.36 ml/m LA Vol (A4C):   25.0 ml 12.64 ml/m LA Biplane Vol: 22.5 ml 11.38 ml/m  AORTIC VALVE                    PULMONIC VALVE AV Area (Vmax):    2.80  cm     PV Vmax:       1.25 m/s AV Area (Vmean):   2.78 cm     PV Peak grad:  6.2 mmHg AV Area (VTI):     2.92 cm AV Vmax:           111.00 cm/s AV Vmean:          75.700 cm/s AV VTI:            0.130 m AV Peak Grad:       4.9 mmHg AV Mean Grad:      3.0 mmHg LVOT Vmax:         99.00 cm/s LVOT Vmean:        67.100 cm/s LVOT VTI:          0.121 m LVOT/AV VTI ratio: 0.93  AORTA Ao Root diam: 2.60 cm Ao Asc diam:  2.70 cm MITRAL VALVE MV Area (PHT): 3.46 cm    SHUNTS MV Decel Time: 219 msec    Systemic VTI:  0.12 m MV E velocity: 52.90 cm/s  Systemic Diam: 2.00 cm MV A velocity: 81.00 cm/s MV E/A ratio:  0.65 Cherlynn Kaiser MD Electronically signed by Cherlynn Kaiser MD Signature Date/Time: 03/22/2022/9:42:10 AM    Final

## 2022-03-26 NOTE — Telephone Encounter (Signed)
Patient is still admitted in the hospital.

## 2022-03-26 NOTE — Progress Notes (Signed)
PROGRESS NOTE    Sarah Wu  [id number] DOB: Jun 27, 1950 DOA: 03/21/2022 PCP: Levin Erp, DO   Brief Narrative:   72 y.o. female with medical history significant of prediabetes, osteoarthritisStatus post right total knee replacement, fibroid tumor, hyperlipidemia, hypertension, postmenopausal bleeding, peripheral neuropathy, stage IV uterine cancer with pancytopenia who was transferred to this facility from [facility] where she has been treated for the past 2 days due to C. difficile colitis, electrolyte abnormalities, sinus tachycardia with QT prolongation that has now converted into atrial fibrillation with RVR . Cardiology consulted and patient started on IV amiodarone and IV heparin. She was transitioned to oral amiodarone and eliquis. X-ray and CT of the right knee shows possible loosening of previous prosthetic knee, orthopedic consulted.   Assessment & Plan:  Principal Problem:   C. difficile colitis Active Problems:   Essential hypertension   Prediabetes   Pure hypercholesterolemia   Peripheral neuropathy due to chemotherapy (HCC)   Pressure injury of skin   Atrial fibrillation with RVR (HCC)   Malnutrition of moderate degree    Pancolitis secondary to C. difficile infection -Currently on Dificid, started on 2/10.  Symptomatic management, IV fluids as necessary.  Diet as tolerated.   Atrial fibrillation with RVR, Improved -Seen by [facility] cardiology.  Currently on amiodarone, p.o. metoprolol and anticoagulation. -TSH within normal limits. F/u with PCP   Prolonged QTc  Keep potassium greater than 4 and magnesium greater than 2 Monitor electrolytes    Hypokalemia/hypokalemia - As needed repletion  Abnormal right knee x-ray - X-ray and CT scan of the right knee concerns for loosening of previous prosthetic hardware.  Discussed with [facility] who will consult on this patient.Addressing this eventually will help her with her therapy    Mild thrombocytopenia Acquired pancytopenia -Continue to monitor.  No obvious evidence of gross bleeding.  Transfuse as necessary Status post PRBC transfusion 2/13 Hemoccult positive-expected in setting of colitis   Stage 4 Uterine cancer S/p robotic assisted laparoscopic total hysterectomy with bilateral salpingo-oophorectomy,. S/p reduced dose of chemotherapy completed 6 cycles.  Follows up with Dr. Alvy Bimler Peripheral neuropathy from chemotherapy   DVT prophylaxis: Eliquis Code Status: Full code Family Communication: Daughters are present at bedside  Status is: Inpatient Remains inpatient appropriate because: Still having significant amount of diarrhea.   Nutritional status    Signs/Symptoms: mild fat depletion, mild muscle depletion  Interventions: Refer to RD note for recommendations, Magic cup, MVI  Body mass index is 27.56 kg/m.  Pressure Injury 03/21/22 Sacrum Mid Stage 3 -  Full thickness tissue loss. Subcutaneous fat may be visible but bone, tendon or muscle are NOT exposed. Red (Active)  03/21/22 0800  Location: Sacrum  Location Orientation: Mid  Staging: Stage 3 -  Full thickness tissue loss. Subcutaneous fat may be visible but bone, tendon or muscle are NOT exposed.  Wound Description (Comments): Red  Present on Admission: Yes     Subjective: Seen and examined at bedside, working with physical therapy but quite weak. Diarrhea appears to have improved.  Examination: Constitutional: Not in acute distress Respiratory: bibasilar crackles.  Cardiovascular: Normal sinus rhythm, no rubs Abdomen: Nontender nondistended good bowel sounds Musculoskeletal: No edema noted Skin: No rashes seen Neurologic: CN 2-12 grossly intact.  And nonfocal Psychiatric: Normal judgment and insight. Alert and oriented x 3. Normal mood.  Right chest wall port in place External catheter Rectal tube  Objective: Vitals:   03/25/22 1255 03/25/22 1426 03/25/22 2109 03/26/22  0606  BP:  101/66 113/68 116/65  Pulse:  88 92 94  Resp:  18 18 16  $ Temp: 98.1 F (36.7 C) 98.1 F (36.7 C) 98.7 F (37.1 C) 98.6 F (37 C)  TempSrc: Oral Oral Oral Oral  SpO2:  96% 97% 95%  Weight:      Height:        Intake/Output Summary (Last 24 hours) at 03/26/2022 0734 Last data filed at 03/26/2022 0342 Gross per 24 hour  Intake 298.96 ml  Output 1600 ml  Net -1301.04 ml   Filed Weights   03/23/22 2016  Weight: 82.2 kg     Data Reviewed:   CBC: Recent Labs  Lab 03/22/22 0235 03/23/22 0600 03/24/22 0330 03/25/22 0405 03/26/22 0400  WBC 10.2 6.5 5.1 5.7 4.4  HGB 9.1* 7.9* 7.3* 8.8* 8.7*  HCT 28.3* 24.9* 23.0* 27.6* 27.4*  MCV 88.7 89.2 90.6 89.9 90.4  PLT 149* 117* 101* 102* [id number]*   Basic Metabolic Panel: Recent Labs  Lab 03/22/22 0235 03/23/22 0600 03/23/22 1509 03/24/22 0330 03/25/22 0405 03/25/22 1240 03/26/22 0400  NA 135 133*  --  134* 134*  --  132*  K 3.4* 2.9*  --  3.0* 3.0*  --  3.0*  CL 96* 97*  --  99 98  --  98  CO2 23 23  --  24 23  --  24  GLUCOSE 123* 100*  --  87 98  --  83  BUN 24* 24*  --  19 12  --  9  CREATININE 1.00 1.03*  --  0.85 0.67  --  0.54  CALCIUM 8.0* 7.9*  --  7.7* 7.9*  --  7.6*  MG  --  2.0 1.6* 1.9 1.4*  --  1.5*  PHOS  --  2.4*  --  2.8 3.4 3.7 3.7   GFR: Estimated Creatinine Clearance: 71.4 mL/min (by C-G formula based on SCr of 0.54 mg/dL). Liver Function Tests: Recent Labs  Lab 03/21/22 0621 03/22/22 0235  AST 37 29  ALT 23 22  ALKPHOS 62 66  BILITOT 1.6* 1.4*  PROT 6.2* 6.1*  ALBUMIN 2.2* 2.1*   No results for input(s): "LIPASE", "AMYLASE" in the last 168 hours. No results for input(s): "AMMONIA" in the last 168 hours. Coagulation Profile: No results for input(s): "INR", "PROTIME" in the last 168 hours. Cardiac Enzymes: No results for input(s): "CKTOTAL", "CKMB", "CKMBINDEX", "TROPONINI" in the last 168 hours. BNP (last 3 results) No results for input(s): "PROBNP" in the last 8760  hours. HbA1C: No results for input(s): "HGBA1C" in the last 72 hours. CBG: Recent Labs  Lab 03/25/22 0730 03/25/22 1156 03/25/22 1743 03/25/22 2327 03/26/22 0722  GLUCAP 80 99 87 89 76   Lipid Profile: No results for input(s): "CHOL", "HDL", "LDLCALC", "TRIG", "CHOLHDL", "LDLDIRECT" in the last 72 hours. Thyroid Function Tests: No results for input(s): "TSH", "T4TOTAL", "FREET4", "T3FREE", "THYROIDAB" in the last 72 hours. Anemia Panel: Recent Labs    03/24/22 1244  VITAMINB12 5,396*  FOLATE 12.4  FERRITIN 1,373*  TIBC 151*  IRON 36  RETICCTPCT 1.6   Sepsis Labs: No results for input(s): "PROCALCITON", "LATICACIDVEN" in the last 168 hours.  Recent Results (from the past 240 hour(s))  MRSA Next Gen by PCR, Nasal     Status: None   Collection Time: 03/21/22  6:04 AM   Specimen: Nasal Mucosa; Nasal Swab  Result Value Ref Range Status   MRSA by PCR Next Gen NOT DETECTED NOT DETECTED Final    Comment: (  NOTE) The GeneXpert MRSA Assay (FDA approved for NASAL specimens only), is one component of a comprehensive MRSA colonization surveillance program. It is not intended to diagnose MRSA infection nor to guide or monitor treatment for MRSA infections. Test performance is not FDA approved in patients less than 66 years old. Performed at [facility], [place] [address], [place], [place] [address]   Culture, blood (Routine X 2) w Reflex to ID Panel     Status: None (Preliminary result)   Collection Time: 03/24/22 12:44 PM   Specimen: BLOOD  Result Value Ref Range Status   Specimen Description   Final    BLOOD BLOOD LEFT ARM Performed at [facility] [address] [place], [place] [address]    Special Requests   Final    BOTTLES DRAWN AEROBIC ONLY Blood Culture adequate volume Performed at [facility] [address], [place], [place] [address]    Culture   Final    NO GROWTH < 24 HOURS Performed at [facility] [address] [place], [place] [address]    Report Status PENDING  Incomplete  Culture, blood (Routine X 2) w Reflex to ID Panel     Status: None (Preliminary result)   Collection Time: 03/24/22 12:44 PM   Specimen: BLOOD  Result Value Ref Range Status   Specimen Description   Final    BLOOD BLOOD LEFT HAND Performed at [facility] [address] [place], [place] [address]    Special Requests   Final    BOTTLES DRAWN AEROBIC ONLY Blood Culture adequate volume Performed at [facility] [address], [place], [place] [address]    Culture   Final    NO GROWTH < 24 HOURS Performed at [facility] [address] [place], [place] [address]    Report Status PENDING  Incomplete         Radiology Studies: CT KNEE RIGHT WO CONTRAST  Result Date: 03/25/2022 CLINICAL DATA:  Loose body, knee. Probable loosening and subsidence of the femoral component. EXAM: CT OF THE RIGHT KNEE WITHOUT CONTRAST TECHNIQUE: Multidetector CT imaging of the right knee was performed according to the standard protocol. Multiplanar CT image reconstructions were also generated. RADIATION DOSE REDUCTION: This exam was performed according to the departmental dose-optimization program which includes automated exposure control, adjustment of the mA and/or kV according to patient size and/or use of iterative reconstruction technique. COMPARISON:  Radiographs same date.  No other comparison studies. FINDINGS: Bones/Joint/Cartilage Status post right total knee arthroplasty (bilateral total knee arthroplasty on the scout image). As seen on the earlier radiographs, there is an abnormal orientation of the femoral component relative to the distal femur. There is some lucency between the femur and the femoral component which could indicate loosening. The femoral component also appears posterolaterally displaced relative to the distal femoral metaphysis. On the coronal  images, there is cortical step-off along the lateral femoral epicondyle (see coronal image 68/6). These findings may be chronic and related to an old partially healed fracture. Bone detail is limited by artifact from the arthroplasty, and an acute fracture cannot be completely excluded in the appropriate clinical context (such as recent fall or acute pain). The tibial component appears intact without loosening. The proximal tibia, fibula and patella appear intact. The bones are diffusely demineralized. No significant joint effusion identified. Ligaments Suboptimally assessed by CT. Muscles and Tendons Generalized muscular atrophy. The extensor mechanism appears intact,  although the patellar tendon is partly obscured by artifact. No evidence of intramuscular hematoma or other focal fluid collection. Soft tissues Generalized subcutaneous edema without evidence of focal fluid collection, unexpected foreign body or soft tissue emphysema. IMPRESSION: 1. Abnormal orientation of the femoral component of the right total knee arthroplasty as seen on earlier radiographs. The femoral component appears posterolaterally displaced relative to the distal femoral metaphysis and there is possible loosening. 2. Cortical step-off along the lateral femoral epicondyle may be chronic and related to an old partially healed fracture. Bone detail is limited by artifact from the arthroplasty, and an acute fracture cannot be completely excluded in the appropriate clinical context. 3. The tibial component appears intact without loosening. 4. Generalized muscular atrophy. 5. Generalized subcutaneous edema without evidence of focal fluid collection, unexpected foreign body or soft tissue emphysema. Electronically Signed   By: Richardean Sale M.D.   On: 03/25/2022 18:15   DG Knee Complete 4 Views Right  Result Date: 03/25/2022 CLINICAL DATA:  Previous knee replacement with pain EXAM: RIGHT KNEE - COMPLETE 4+ VIEW COMPARISON:  None Available.  FINDINGS: Patient has had previous total knee arthroplasty. No abnormality seen relating to the tibial component. Probable loosening and subsidence of the femoral component, possibly with intra-articular fragmentation. Patellar component is not well seen but may also be abnormal. Joint effusion is present. IMPRESSION: Previous total knee arthroplasty. Probable loosening and subsidence of the femoral component, possibly with intra-articular fragmentation. Patellar component is not well seen but may also be abnormal. Joint effusion. Electronically Signed   By: Nelson Chimes M.D.   On: 03/25/2022 13:55        Scheduled Meds:  amiodarone  400 mg Oral BID   Followed by   Derrill Memo ON 03/30/2022] amiodarone  200 mg Oral BID   Followed by   Derrill Memo ON 04/06/2022] amiodarone  200 mg Oral Daily   apixaban  5 mg Oral BID   ascorbic acid  500 mg Oral Daily   atorvastatin  10 mg Oral QHS   Chlorhexidine Gluconate Cloth  6 each Topical Q0600   ferrous sulfate  325 mg Oral Q breakfast   fidaxomicin  200 mg Oral BID   metoprolol tartrate  12.5 mg Oral BID   multivitamin with minerals  1 tablet Oral Daily   sodium chloride flush  10-40 mL Intracatheter Q12H   Continuous Infusions:  famotidine (PEPCID) IV 20 mg (03/25/22 1423)     LOS: 4 days   Time spent= 35 mins    Alaysha Jefcoat Arsenio Loader, MD [facility]  If 7PM-7AM, please contact night-coverage  03/26/2022, 7:34 AM

## 2022-03-26 NOTE — Radiation Completion Notes (Signed)
Patient Name: Sarah Wu, Sarah Wu MRN: [id number] Date of Birth: [birth year] Referring Physician: Jeral Pinch, M.D. Date of Service: 2022-03-26 Radiation Oncologist: Teryl Lucy, M.D. [place] END OF TREATMENT NOTE     Diagnosis: C55 Malignant neoplasm of uterus, part unspecified Staging on 2021-09-15: Uterine cancer ([place]) T=cT3, N=cN2a, M=pM1 Intent: Curative     ==========DELIVERED PLANS==========  First Treatment Date: 2022-02-26 - Last Treatment Date: 2022-03-12   Plan Name: [id number] Site: Vagina Technique: HDR Ir-192 Mode: Brachytherapy Dose Per Fraction: 6 Gy Prescribed Dose (Delivered / Prescribed): 18 Gy / 30 Gy Prescribed Fxs (Delivered / Prescribed): 3 / 5     ==========ON TREATMENT VISIT DATES========== 2022-02-26, 2022-03-05, 2022-03-12     ==========UPCOMING VISITS==========       ==========APPENDIX - ON TREATMENT VISIT NOTES==========   See weekly On Treatment Notes is Epic for details.

## 2022-03-26 NOTE — TOC Progression Note (Signed)
Transition of Care [facility]) - Progression Note    Patient Details  Name: Sarah Wu MRN: [id number] Date of Birth: 05-Sep-1950  Transition of [facility] CM/SW [place], LCSW Phone Number: 03/26/2022, 1:05 PM  Clinical Narrative:     CSW spoke with pt's daughter regarding PT recommendations. Pt was recommended for CIR, however, CIR has declined. Pt's daughter reported speaking with pt's MD and the Physical therapist about fixing pt's knee. She reported she is not interested in skilled nursing right now.she wants Inpatient rehab for her mother.  She reported she wants to wait and revisit SNF placement at a later time. TOC to follow.         Expected Discharge Plan and Services                                               Social Determinants of Health (SDOH) Interventions SDOH Screenings   Food Insecurity: No Food Insecurity (03/23/2022)  Housing: Low Risk  (03/23/2022)  Transportation Needs: No Transportation Needs (03/23/2022)  Utilities: Not At Risk (03/23/2022)  Tobacco Use: Low Risk  (03/23/2022)    Readmission Risk Interventions     No data to display

## 2022-03-26 NOTE — Evaluation (Signed)
Occupational Therapy Evaluation Patient Details Name: Sarah Wu MRN: [id number] DOB: 04-07-1950 Today's Date: 03/26/2022   History of Present Illness Patient is 72 y.o. female who was transferred [facility] from [facility] where she was treated for 2 days due to C. difficile colitis, electrolyte abnormalities, sinus tachycardia with QT prolongation that has now converted into atrial fibrillation with RVR. PMH significant for prediabetes, osteoarthritis, fibroid tumor, HLD, HTN, postmenopausal bleeding, peripheral neuropathy, stage IV uterine cancer with pancytopenia.   Clinical Impression   [name] is a 72 year old woman who is typically able to ambulate with cane and independent with ADLs. On evaluation she is mod-total assist for bed transfers with +2 assist, and requires mod-total assist for Adls at bed level except  for grooming and feeding.She sat edge of bed briefly but limited by sacral wound pain. Need for BM and position of rectal tube resulting in patient being returned to bed within a couple of minutes. Did not attempt stand as MD informed therapist that he has consulted ortho on potential loosening of hardware in patient's knee. Patient will benefit from skilled OT services while in hospital to improve deficits and learn compensatory strategies as needed in order to improve functional abilities. Patient's family wanting to pursue CIR - however patient's activity tolerance still quite limited. Current recommendation is SNF and will update POC when patient able to show improved participation.      Recommendations for follow up therapy are one component of a multi-disciplinary discharge planning process, led by the attending physician.  Recommendations may be updated based on patient status, additional functional criteria and insurance authorization.   Follow Up Recommendations  Other (comment) (SNF vs CIR)     Assistance Recommended at Discharge Frequent  or constant Supervision/Assistance  Patient can return home with the following Two people to help with walking and/or transfers;A lot of help with bathing/dressing/bathroom;Assistance with cooking/housework;Assist for transportation;Help with stairs or ramp for entrance    Functional Status Assessment  Patient has had a recent decline in their functional status and demonstrates the ability to make significant improvements in function in a reasonable and predictable amount of time.  Equipment Recommendations  Other (comment) (Defer to NExt Venue)    Recommendations for Other Services       Precautions / Restrictions Precautions Precautions: Fall;Other (comment) Precaution Comments: R knee pain - loosening of TKA component? Restrictions Weight Bearing Restrictions: No Other Position/Activity Restrictions: Per Dr. Reesa Chew - limit wb through RLE till ortho consult      Mobility Bed Mobility Overal bed mobility: Needs Assistance Bed Mobility: Supine to Sit, Sit to Supine     Supine to sit: +2 for physical assistance, Mod assist Sit to supine: +2 for physical assistance, Total assist   General bed mobility comments: pt encouraged to engage and use bed rails to assist using muscle strength    Transfers                          Balance Overall balance assessment: Needs assistance Sitting-balance support: Bilateral upper extremity supported, Feet supported Sitting balance-Leahy Scale: Poor                                     ADL either performed or assessed with clinical judgement   ADL Overall ADL's : Needs assistance/impaired Eating/Feeding: Set up;Bed level   Grooming: Set up;Bed level  Upper Body Bathing: Moderate assistance;Bed level   Lower Body Bathing: Total assistance;Bed level   Upper Body Dressing : Maximal assistance;Bed level   Lower Body Dressing: Total assistance;Bed level   Toilet Transfer: Total assistance   Toileting-  Clothing Manipulation and Hygiene: Total assistance;Bed level               Vision   Vision Assessment?: No apparent visual deficits     Perception     Praxis      Pertinent Vitals/Pain Pain Assessment Pain Score: 4  Pain Location: Abdomen, buttocks Pain Descriptors / Indicators: Grimacing, Sore Pain Intervention(s): Limited activity within patient's tolerance     Hand Dominance Right   Extremity/Trunk Assessment Upper Extremity Assessment Upper Extremity Assessment: Generalized weakness   Lower Extremity Assessment Lower Extremity Assessment: Defer to PT evaluation   Cervical / Trunk Assessment Cervical / Trunk Assessment: Normal   Communication Communication Communication: No difficulties   Cognition Arousal/Alertness: Awake/alert Behavior During Therapy: Flat affect Overall Cognitive Status: Within Functional Limits for tasks assessed                                                  Home Living Family/patient expects to be discharged to:: Private residence Living Arrangements: Children Available Help at Discharge: Family Type of Home: House Home Access: Stairs to enter Technical brewer of Steps: 4 Entrance Stairs-Rails: Right;Left Home Layout: One level     Bathroom Shower/Tub: Occupational psychologist: Standard Bathroom Accessibility: Yes   Home Equipment: Teaching laboratory technician (2 wheels);Cane - single point;Grab bars - toilet          Prior Functioning/Environment Prior Level of Function : Independent/Modified Independent             Mobility Comments: amb with SPC. daughter assists for medication management to ensure medications taken properly          OT Problem List: Decreased strength;Decreased range of motion;Decreased activity tolerance;Impaired balance (sitting and/or standing);Decreased knowledge of use of DME or AE;Decreased knowledge of precautions;Pain;Obesity       OT Treatment/Interventions: Self-care/ADL training;Therapeutic exercise;DME and/or AE instruction;Therapeutic activities;Balance training;Patient/family education    OT Goals(Current goals can be found in the care plan section) Acute Rehab OT Goals Patient Stated Goal: none stated OT Goal Formulation: With patient/family Time For Goal Achievement: 04/09/22 Potential to Achieve Goals: Fair  OT Frequency: Min 2X/week    Co-evaluation              AM-PAC OT "6 Clicks" Daily Activity     Outcome Measure Help from another person eating meals?: A Little Help from another person taking care of personal grooming?: A Little Help from another person toileting, which includes using toliet, bedpan, or urinal?: Total Help from another person bathing (including washing, rinsing, drying)?: A Lot Help from another person to put on and taking off regular upper body clothing?: A Lot Help from another person to put on and taking off regular lower body clothing?: Total 6 Click Score: 12   End of Session Nurse Communication: Mobility status  Activity Tolerance: Patient limited by pain;Patient limited by fatigue Patient left: in bed;with call bell/phone within reach;with bed alarm set;with family/visitor present  OT Visit Diagnosis: Muscle weakness (generalized) (M62.81)                Time:  [id number] OT Time Calculation (min): 19 min Charges:  OT General Charges $OT Visit: 1 Visit OT Evaluation $OT Eval Low Complexity: 1 Low  Gustavo Lah, OTR/L [place]  Office [phone]   Lenward Chancellor 03/26/2022, 1:25 PM

## 2022-03-27 DIAGNOSIS — A0472 Enterocolitis due to Clostridium difficile, not specified as recurrent: Secondary | ICD-10-CM | POA: Diagnosis not present

## 2022-03-27 LAB — BASIC METABOLIC PANEL
Anion gap: 11 (ref 5–15)
BUN: 6 mg/dL — ABNORMAL LOW (ref 8–23)
CO2: 25 mmol/L (ref 22–32)
Calcium: 7.6 mg/dL — ABNORMAL LOW (ref 8.9–10.3)
Chloride: 98 mmol/L (ref 98–111)
Creatinine, Ser: 0.58 mg/dL (ref 0.44–1.00)
GFR, Estimated: >60 mL/min (ref >60)
Glucose, Bld: 82 mg/dL (ref 70–99)
Potassium: 3 mmol/L — ABNORMAL LOW (ref 3.5–5.1)
Sodium: 134 mmol/L — ABNORMAL LOW (ref 135–145)

## 2022-03-27 LAB — PHOSPHORUS: Phosphorus: 3.2 mg/dL (ref 2.5–4.6)

## 2022-03-27 LAB — CBC
HCT: 26.6 % — ABNORMAL LOW (ref 36.0–46.0)
Hemoglobin: 8.4 g/dL — ABNORMAL LOW (ref 12.0–15.0)
MCH: 28.6 pg (ref 26.0–34.0)
MCHC: 31.6 g/dL (ref 30.0–36.0)
MCV: 90.5 fL (ref 80.0–100.0)
Platelets: 116 10*3/uL — ABNORMAL LOW (ref 150–400)
RBC: 2.94 MIL/uL — ABNORMAL LOW (ref 3.87–5.11)
RDW: 18 % — ABNORMAL HIGH (ref 11.5–15.5)
WBC: 4 10*3/uL (ref 4.0–10.5)
nRBC: 0 % (ref 0.0–0.2)

## 2022-03-27 LAB — MAGNESIUM: Magnesium: 1.5 mg/dL — ABNORMAL LOW (ref 1.7–2.4)

## 2022-03-27 MED ORDER — POTASSIUM CHLORIDE 10 MEQ/100ML IV SOLN
10.0000 meq | INTRAVENOUS | Status: AC
  Administered 2022-03-27 (×2): 10 meq via INTRAVENOUS
  Filled 2022-03-27 (×2): qty 100

## 2022-03-27 MED ORDER — POTASSIUM CHLORIDE 10 MEQ/100ML IV SOLN
10.0000 meq | INTRAVENOUS | Status: AC
  Administered 2022-03-27 (×3): 10 meq via INTRAVENOUS
  Filled 2022-03-27 (×5): qty 100

## 2022-03-27 MED ORDER — MAGNESIUM SULFATE 4 GM/100ML IV SOLN
4.0000 g | Freq: Once | INTRAVENOUS | Status: AC
  Administered 2022-03-27: 4 g via INTRAVENOUS
  Filled 2022-03-27: qty 100

## 2022-03-27 NOTE — NC FL2 (Signed)
[place] LEVEL OF CARE FORM     IDENTIFICATION  Patient Name: Sarah Wu Birthdate: 02-16-1950 Sex: female Admission Date (Current Location): 03/21/2022  [facility] and [place] Number:  Herbalist and Address:  [facility],  [place], [place]      Provider Number: [id number]  Attending Physician Name and Address:  Damita Lack, MD  Relative Name and Phone Number:  Salois,Robbie (Daughter) [phone] (Mobile)    Current Level of Care: Hospital Recommended Level of Care: [place] Prior Approval Number:    Date Approved/Denied:   PASRR Number: [id number]  Discharge Plan: SNF    Current Diagnoses: Patient Active Problem List   Diagnosis Date Noted   Malnutrition of moderate degree 03/24/2022   C. 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   Bilateral edema of lower extremity 09/17/2021   Anemia, chronic disease 09/17/2021   Uterine cancer ([place]) 08/22/2021   BMI 33.0-33.9,adult 08/22/2021   Colon cancer screening 12/23/2020   Prediabetes 11/08/2020   Pure hypercholesterolemia 11/08/2020   Chronic gout of right foot 03/29/2018   Essential hypertension 03/29/2018   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 ([place]) 09/14/2017   Osteoarthritis of right knee 09/02/2017    Orientation RESPIRATION BLADDER Height & Weight     Self, Situation, Place, Time  Normal Incontinent Weight: 198 lb 6.6 oz (90 kg) Height:  5' 8"$  (172.7 cm)  BEHAVIORAL SYMPTOMS/MOOD NEUROLOGICAL BOWEL NUTRITION STATUS      Incontinent, Continent Diet (soft)   AMBULATORY STATUS COMMUNICATION OF NEEDS Skin   Limited Assist Verbally Normal                       Personal Care Assistance Level of Assistance  Bathing, Feeding, Dressing Bathing Assistance: Limited assistance Feeding assistance: Independent Dressing Assistance: Limited assistance     Functional Limitations Info  Sight, Hearing, Speech Sight Info: Adequate Hearing Info: Adequate Speech Info: Adequate    SPECIAL CARE FACTORS FREQUENCY  PT (By licensed PT), OT (By licensed OT)     PT Frequency: 5 x a week OT Frequency: 5 x a wek            Contractures Contractures Info: Present    Additional Factors Info  Code Status, Allergies Code Status Info: full Allergies Info: Hydrocodone-acetaminophen,Oxycodone-acetaminophen           Current Medications (03/27/2022):  This is the current hospital active medication list Current Facility-Administered Medications  Medication Dose Route Frequency Provider Last Rate Last Admin   acetaminophen (TYLENOL) tablet 650 mg  650 mg Oral Q6H PRN Hosie Poisson, MD   650 mg at 03/25/22 [id number]   Or   acetaminophen (TYLENOL) suppository 650 mg  650 mg Rectal Q6H PRN Hosie Poisson, MD       amiodarone (PACERONE) tablet 400 mg  400 mg Oral BID Hosie Poisson, MD   400 mg at 03/27/22 1210   Followed by   Derrill Memo ON 03/30/2022] amiodarone (PACERONE) tablet 200 mg  200 mg Oral BID Hosie Poisson, MD       Followed by   Derrill Memo ON 04/06/2022] amiodarone (PACERONE)  tablet 200 mg  200 mg Oral Daily Hosie Poisson, MD       apixaban (ELIQUIS) tablet 5 mg  5 mg Oral BID Hosie Poisson, MD   5 mg at 03/27/22 1212   ascorbic acid (VITAMIN C) tablet 500 mg  500 mg Oral Daily Hosie Poisson, MD   500 mg at 03/27/22 1212   atorvastatin (LIPITOR) tablet 10 mg  10 mg Oral QHS Amin, Ankit Chirag, MD   10 mg at 03/26/22 2153   Chlorhexidine Gluconate Cloth 2 % PADS 6 each  6 each Topical Q0600 Hosie Poisson, MD   6 each at 03/27/22 1216   famotidine (PEPCID)  IVPB 20 mg premix  20 mg Intravenous Q24H Hosie Poisson, MD 100 mL/hr at 03/27/22 1437 20 mg at 03/27/22 1437   ferrous sulfate tablet 325 mg  325 mg Oral Q breakfast Amin, Ankit Chirag, MD   325 mg at 03/27/22 0841   fidaxomicin (DIFICID) tablet 200 mg  200 mg Oral BID Hosie Poisson, MD   200 mg at 03/27/22 1210   guaiFENesin (ROBITUSSIN) 100 MG/5ML liquid 5 mL  5 mL Oral Q4H PRN Amin, Ankit Chirag, MD       hydrALAZINE (APRESOLINE) injection 10 mg  10 mg Intravenous Q4H PRN Amin, Ankit Chirag, MD       hydrOXYzine (ATARAX) 10 MG/5ML syrup 25 mg  25 mg Oral Q6H PRN Hosie Poisson, MD       ipratropium-albuterol (DUONEB) 0.5-2.5 (3) MG/3ML nebulizer solution 3 mL  3 mL Nebulization Q4H PRN Amin, Ankit Chirag, MD       liver oil-zinc oxide (DESITIN) 40 % ointment   Topical QID PRN Amin, Ankit Chirag, MD       metoprolol tartrate (LOPRESSOR) injection 5 mg  5 mg Intravenous Q4H PRN Amin, Ankit Chirag, MD       metoprolol tartrate (LOPRESSOR) tablet 12.5 mg  12.5 mg Oral BID Hosie Poisson, MD   12.5 mg at 03/27/22 1212   multivitamin with minerals tablet 1 tablet  1 tablet Oral Daily Hosie Poisson, MD   1 tablet at 03/27/22 1212   ondansetron (ZOFRAN) injection 4 mg  4 mg Intravenous Q6H PRN Hosie Poisson, MD   4 mg at 03/24/22 1010   Oral care mouth rinse  15 mL Mouth Rinse PRN Amin, Ankit Chirag, MD       prochlorperazine (COMPAZINE) injection 5 mg  5 mg Intravenous Q4H PRN Hosie Poisson, MD   5 mg at 03/21/22 2312   senna-docusate (Senokot-S) tablet 1 tablet  1 tablet Oral QHS PRN Amin, Ankit Chirag, MD       simethicone (MYLICON) chewable tablet 80 mg  80 mg Oral Q6H PRN Amin, Ankit Chirag, MD       sodium chloride flush (NS) 0.9 % injection 10-40 mL  10-40 mL Intracatheter Q12H Hosie Poisson, MD   10 mL at 03/27/22 1035   sodium chloride flush (NS) 0.9 % injection 10-40 mL  10-40 mL Intracatheter PRN Hosie Poisson, MD       traMADol (ULTRAM) tablet 50 mg  50 mg Oral Q6H PRN Amin, Ankit Chirag, MD        traZODone (DESYREL) tablet 50 mg  50 mg Oral QHS PRN Amin, Jeanella Flattery, MD         Discharge Medications: Please see discharge summary for a list of discharge medications.  Relevant Imaging Results:  Relevant Lab Results:   Additional Information [phone]  [place], LCSW

## 2022-03-27 NOTE — TOC Progression Note (Signed)
Transition of Care [facility]) - Progression Note    Patient Details  Name: Sarah Wu MRN: [id number] Date of Birth: June 07, 1950  Transition of [facility]) CM/SW [place], LCSW Phone Number: 03/27/2022, 2:17 PM  Clinical Narrative:    CSW spoke with pt and pt's daughter regarding SNF placement, they are in agreement for pt's information to be sent out to [place] area. Pt and family did ask about facilities in [place]. CSW explained when they know of a facility pt's information can be sent out. They reported they will follow up. CSW to work pt up for SNF placement. TOC to follow.     Expected Discharge Plan and Services                                               Social Determinants of Health (SDOH) Interventions SDOH Screenings   Food Insecurity: No Food Insecurity (03/23/2022)  Housing: Low Risk  (03/23/2022)  Transportation Needs: No Transportation Needs (03/23/2022)  Utilities: Not At Risk (03/23/2022)  Tobacco Use: Low Risk  (03/23/2022)    Readmission Risk Interventions     No data to display

## 2022-03-27 NOTE — Progress Notes (Signed)
Physical Therapy Treatment Patient Details Name: Sarah Wu MRN: [id number] DOB: [birth year] Today's Date: 03/27/2022   History of Present Illness Patient is 72 y.o. female who was transferred [facility] from [facility] where she was treated for 2 days due to C. difficile colitis, electrolyte abnormalities, sinus tachycardia with QT prolongation that has now converted into atrial fibrillation with RVR. PMH significant for prediabetes, osteoarthritis, fibroid tumor, HLD, HTN, postmenopausal bleeding, peripheral neuropathy, stage IV uterine cancer with pancytopenia.    PT Comments    Pt admitted secondary to problem above with deficits below. Pt currently requires extensive assist for bed mobility tasks, pt exhibits self limiting behaviors, ongoing pain report and indication of dizziness seated EOB,  secondary to abn findings of CT of R knee new recommendations for R LE TDWB with KI donned. Anticipate patient will benefit from PT to address problems listed below.Will continue to follow acutely to maximize functional mobility independence and safety.      Recommendations for follow up therapy are one component of a multi-disciplinary discharge planning process, led by the attending physician.  Recommendations may be updated based on patient status, additional functional criteria and insurance authorization.  Follow Up Recommendations  Skilled nursing-short term rehab (<3 hours/day) Can patient physically be transported by private vehicle: No   Assistance Recommended at Discharge Frequent or constant Supervision/Assistance  Patient can return home with the following Two people to help with walking and/or transfers;Two people to help with bathing/dressing/bathroom;Assistance with cooking/housework;Direct supervision/assist for medications management;Direct supervision/assist for financial management;Assist for transportation;Help with stairs or ramp for entrance   Equipment  Recommendations  Other (comment)    Recommendations for Other Services       Precautions / Restrictions Precautions Precautions: Fall;Other (comment) Precaution Comments: R LETDWB with KI (she may be touch down weightbearing with knee immobilizer and walker with 1-2 person support at all times) Restrictions Weight Bearing Restrictions: Yes RLE Weight Bearing: Touchdown weight bearing     Mobility  Bed Mobility Overal bed mobility: Needs Assistance Bed Mobility: Supine to Sit, Sit to Supine, Rolling Rolling: Max assist   Supine to sit: Max assist Sit to supine: Max assist, +2 for physical assistance   General bed mobility comments: pt encouraged to engage, participate and use bed rails to assist for increased I and functional strength    Transfers                        Ambulation/Gait                   Stairs             Wheelchair Mobility    Modified Rankin (Stroke Patients Only)       Balance Overall balance assessment: Needs assistance Sitting-balance support: Bilateral upper extremity supported, Feet supported Sitting balance-Leahy Scale: Poor                                      Cognition Arousal/Alertness: Awake/alert Behavior During Therapy: Flat affect Overall Cognitive Status: Within Functional Limits for tasks assessed                                          Exercises      General Comments  Pertinent Vitals/Pain Pain Assessment Pain Assessment: Faces Pain Score: 5  Faces Pain Scale: Hurts little more Pain Location: buttocks Pain Descriptors / Indicators: Grimacing, Sore Pain Intervention(s): Limited activity within patient's tolerance, Monitored during session    Home Living                          Prior Function            PT Goals (current goals can now be found in the care plan section) Acute Rehab PT Goals Patient Stated Goal: to get stonger and go  home PT Goal Formulation: With patient Time For Goal Achievement: 04/08/22 Potential to Achieve Goals: Good    Frequency    Min 3X/week      PT Plan      Co-evaluation              AM-PAC PT "6 Clicks" Mobility   Outcome Measure  Help needed turning from your back to your side while in a flat bed without using bedrails?: A Lot Help needed moving from lying on your back to sitting on the side of a flat bed without using bedrails?: A Lot Help needed moving to and from a bed to a chair (including a wheelchair)?: A Lot Help needed standing up from a chair using your arms (e.g., wheelchair or bedside chair)?: Total Help needed to walk in hospital room?: Total Help needed climbing 3-5 steps with a railing? : Total 6 Click Score: 9    End of Session   Activity Tolerance: Patient limited by fatigue;Patient limited by pain Patient left: in bed;with call bell/phone within reach;with family/visitor present Nurse Communication: Mobility status PT Visit Diagnosis: Muscle weakness (generalized) (M62.81);Pain Pain - part of body:  (buttock)     Time: [id number] PT Time Calculation (min) (ACUTE ONLY): 26 min  Charges:  $Therapeutic Activity: 23-37 mins                     Baird Lyons, PT    Adair Patter 03/27/2022, 11:55 AM

## 2022-03-27 NOTE — Progress Notes (Signed)
Orthopedic Tech Progress Note Patient Details:  Sarah Wu 1950-07-17 [id number]  Ortho Devices Type of Ortho Device: Knee Immobilizer Ortho Device/Splint Location: Right knee Ortho Device/Splint Interventions: Application   Post Interventions Patient Tolerated: Well  Sarah Wu Sarah Wu 03/27/2022, 8:19 PM

## 2022-03-27 NOTE — Consult Note (Addendum)
Patient ID: Sarah Wu MRN: [id number] DOB/AGE: 08/01/1950 72 y.o.  Admit date: 03/21/2022  Admission Diagnoses:  Principal Problem:   C. 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 Patient underwent primary TKA in [place] around 15 years ago. In 2019, she underwent revision arthroplasty for aseptic loosening at [facility]. She did well for a while but started noticing right knee pain and swelling for past 2 years.  Ongoing admission for C diff colitis and atrial fibrillation with RVR.  PMH notable for prediabetes, osteoarthritis, fibroid tumor, HLD, HTN, postmenopausal bleeding, peripheral neuropathy, stage IV uterine cancer with pancytopenia.  Denies numbness/tingling. She has been stable on RNF. Accompanied by two daughters at bedside.  Past Medical History: Past Medical History:  Diagnosis Date   Arthritis    Cancer ([place])    Fibroid tumor    Hx of mammogram 2022   [place]   Hypercholesteremia    Hypertension    Post-menopausal bleeding    Pre-diabetes     Surgical History: Past Surgical History:  Procedure Laterality Date   EXAMINATION UNDER ANESTHESIA  08/11/2021   with cervical mass biopsies   IR CHEST FLUORO  10/14/2021   IR IMAGING GUIDED PORT INSERTION  09/23/2021   IR IMAGING GUIDED PORT INSERTION  10/17/2021   LAPAROSCOPY N/A 09/10/2021   Procedure: DIAGNOSTIC LAPAROSCOPY;  Surgeon: Lafonda Mosses, MD;  Location: [facility];  Service: Gynecology;  Laterality: N/A;   REPLACEMENT TOTAL KNEE BILATERAL     TUBAL LIGATION      Family History: Family History  Problem Relation Age of Onset   Breast cancer Sister        half-sister   Colon cancer Neg Hx     Ovarian cancer Neg Hx    Endometrial cancer Neg Hx    Pancreatic cancer Neg Hx    Prostate cancer Neg Hx     Social History: Social History   Socioeconomic History   Marital status: Single    Spouse name: Not on file   Number of children: Not on file   Years of education: Not on file   Highest education level: Not on file  Occupational History   Not on file  Tobacco Use   Smoking status: Never    Passive exposure: Never   Smokeless tobacco: Never  Vaping Use   Vaping Use: Never used  Substance and Sexual Activity   Alcohol use: Never   Drug use: Never   Sexual activity: Not Currently  Other Topics Concern   Not on file  Social History Narrative   Not on file   Social Determinants of Health   Financial Resource Strain: Not on file  Food Insecurity: No Food Insecurity (03/23/2022)   Hunger Vital Sign    Worried About Running Out of Food in the Last Year: Never true    Ran Out of Food in the Last Year: Never true  Transportation Needs: No Transportation Needs (03/23/2022)   PRAPARE - Hydrologist (Medical): No    Lack of Transportation (Non-Medical): No  Physical Activity: Not on file  Stress: Not on file  Social Connections: Not on file  Intimate Partner Violence: Unknown (03/23/2022)   Humiliation, Afraid,  Rape, and Kick questionnaire    Fear of Current or Ex-Partner: Patient refused    Emotionally Abused: Patient refused    Physically Abused: Patient refused    Sexually Abused: Patient refused    Allergies: Hydrocodone-acetaminophen and Oxycodone-acetaminophen  Medications: I have reviewed the patient's current medications.  Vital Signs: Patient Vitals for the past 24 hrs:  BP Temp Temp src Pulse Resp SpO2 Weight  03/26/22 1951 122/70 98.2 F (36.8 C) Oral 98 (!) 24 96 % --  03/26/22 1122 117/68 98.2 F (36.8 C) Oral 93 16 95 % --  03/26/22 1113 -- -- -- -- -- -- 90 kg  03/26/22 0606 116/65 98.6 F (37 C) Oral 94 16 95 % --     Radiology: CT KNEE RIGHT WO CONTRAST  Result Date: 03/25/2022 CLINICAL DATA:  Loose body, knee. Probable loosening and subsidence of the femoral component. EXAM: CT OF THE RIGHT KNEE WITHOUT CONTRAST TECHNIQUE: Multidetector CT imaging of the right knee was performed according to the standard protocol. Multiplanar CT image reconstructions were also generated. RADIATION DOSE REDUCTION: This exam was performed according to the departmental dose-optimization program which includes automated exposure control, adjustment of the mA and/or kV according to patient size and/or use of iterative reconstruction technique. COMPARISON:  Radiographs same date.  No other comparison studies. FINDINGS: Bones/Joint/Cartilage Status post right total knee arthroplasty (bilateral total knee arthroplasty on the scout image). As seen on the earlier radiographs, there is an abnormal orientation of the femoral component relative to the distal femur. There is some lucency between the femur and the femoral component which could indicate loosening. The femoral component also appears posterolaterally displaced relative to the distal femoral metaphysis. On the coronal images, there is cortical step-off along the lateral femoral epicondyle (see coronal image 68/6). These findings may be chronic and related to an old partially healed fracture. Bone detail is limited by artifact from the arthroplasty, and an acute fracture cannot be completely excluded in the appropriate clinical context (such as recent fall or acute pain). The tibial component appears intact without loosening. The proximal tibia, fibula and patella appear intact. The bones are diffusely demineralized. No significant joint effusion identified. Ligaments Suboptimally assessed by CT. Muscles and Tendons Generalized muscular atrophy. The extensor mechanism appears intact, although the patellar tendon is partly obscured by artifact. No evidence of intramuscular hematoma or  other focal fluid collection. Soft tissues Generalized subcutaneous edema without evidence of focal fluid collection, unexpected foreign body or soft tissue emphysema. IMPRESSION: 1. Abnormal orientation of the femoral component of the right total knee arthroplasty as seen on earlier radiographs. The femoral component appears posterolaterally displaced relative to the distal femoral metaphysis and there is possible loosening. 2. Cortical step-off along the lateral femoral epicondyle may be chronic and related to an old partially healed fracture. Bone detail is limited by artifact from the arthroplasty, and an acute fracture cannot be completely excluded in the appropriate clinical context. 3. The tibial component appears intact without loosening. 4. Generalized muscular atrophy. 5. Generalized subcutaneous edema without evidence of focal fluid collection, unexpected foreign body or soft tissue emphysema. Electronically Signed   By: Richardean Sale M.D.   On: 03/25/2022 18:15   DG Knee Complete 4 Views Right  Result Date: 03/25/2022 CLINICAL DATA:  Previous knee replacement with pain EXAM: RIGHT KNEE - COMPLETE 4+ VIEW COMPARISON:  None Available. FINDINGS: Patient has had previous total knee arthroplasty. No abnormality seen relating to the tibial component. Probable loosening and subsidence of  the femoral component, possibly with intra-articular fragmentation. Patellar component is not well seen but may also be abnormal. Joint effusion is present. IMPRESSION: Previous total knee arthroplasty. Probable loosening and subsidence of the femoral component, possibly with intra-articular fragmentation. Patellar component is not well seen but may also be abnormal. Joint effusion. Electronically Signed   By: Nelson Chimes M.D.   On: 03/25/2022 13:55   CT ABDOMEN PELVIS W CONTRAST  Result Date: 03/22/2022 CLINICAL DATA:  Acute non localized abdominal pain. Prior uterine carcinoma. * Tracking Code: BO * EXAM: CT  ABDOMEN AND PELVIS WITH CONTRAST TECHNIQUE: Multidetector CT imaging of the abdomen and pelvis was performed using the standard protocol following bolus administration of intravenous contrast. RADIATION DOSE REDUCTION: This exam was performed according to the departmental dose-optimization program which includes automated exposure control, adjustment of the mA and/or kV according to patient size and/or use of iterative reconstruction technique. CONTRAST:  12m OMNIPAQUE IOHEXOL 300 MG/ML  SOLN COMPARISON:  11/21/2021 FINDINGS: Lower Chest: New bilateral lower lobe atelectasis or consolidation and small bilateral pleural effusions. Hepatobiliary: No hepatic masses identified. Gallstones are seen, however there is no evidence of cholecystitis or biliary dilatation. Pancreas:  No mass or inflammatory changes. Spleen: Within normal limits in size and appearance. Adrenals/Urinary Tract: No suspicious masses identified. No evidence of ureteral calculi or hydronephrosis. Stomach/Bowel: Diffuse small bowel and colonic dilatation is seen, consistent with ileus. Mild diffuse wall thickening is seen throughout the colon, consistent with diffuse colitis. No evidence of small bowel dilatation or wall thickening. Normal appendix visualized. No evidence of abscess or free fluid. Vascular/Lymphatic: No pathologically enlarged lymph nodes. No acute vascular findings. Reproductive: Prior hysterectomy noted. Adnexal regions are unremarkable in appearance. Other:  None. Musculoskeletal:  No suspicious bone lesions identified. IMPRESSION: Mild diffuse colitis. Adynamic ileus. No evidence of recurrent or metastatic carcinoma. Cholelithiasis. No radiographic evidence of cholecystitis. New bilateral lower lobe atelectasis or consolidation and small bilateral pleural effusions. Electronically Signed   By: JMarlaine HindM.D.   On: 03/22/2022 15:54   DG Abd 1 View  Result Date: 03/22/2022 CLINICAL DATA:  Abdominal distension.  History of  C diff. EXAM: ABDOMEN - 1 VIEW COMPARISON:  CT scan 11/21/2021 FINDINGS: Diffuse gaseous distension of small bowel and colon noted with gas visible in the descending colon to the level of the sigmoid segment. Mild gaseous distention of the stomach is associated. IMPRESSION: Diffuse gaseous distension of small bowel and colon. Imaging features more suggestive of ileus than obstruction at this time. Electronically Signed   By: EMisty StanleyM.D.   On: 03/22/2022 10:29   ECHOCARDIOGRAM COMPLETE  Result Date: 03/22/2022    ECHOCARDIOGRAM REPORT   Patient Name:   MJIHANNA FASNACHTDate of Exam: 03/22/2022 Medical Rec #:  [id number]         Height:       68.0 in Accession #:    [id number]        Weight:       185.0 lb Date of Birth:  2February 25, 1952         BSA:          1.977 m Patient Age:    89years           BP:           113/76 mmHg Patient Gender: F                  HR:  106 bpm. Exam Location:  Inpatient Procedure: 2D Echo, Cardiac Doppler and Color Doppler Indications:    I48.9 Atrial Fibrillation  History:        Patient has no prior history of Echocardiogram examinations.                 C-Diff; uterine cancer; Risk Factors:Hypertension, Dyslipidemia                 and Non-Smoker.  Sonographer:    Wilkie Aye RVT RCS Referring Phys: [id number] [place]  Sonographer Comments: No subcostal window, Technically challenging study due to limited acoustic windows, suboptimal parasternal window and suboptimal apical window. Image acquisition challenging due to respiratory motion and exam done with patient in supine position. IMPRESSIONS  1. Left ventricular ejection fraction, by estimation, is 65 to 70%. The left ventricle has normal function. The left ventricle has no regional wall motion abnormalities. There is mild left ventricular hypertrophy. Left ventricular diastolic parameters are consistent with Grade I diastolic dysfunction (impaired relaxation).  2. Right ventricular systolic function is normal.  The right ventricular size is normal. Tricuspid regurgitation signal is inadequate for assessing PA pressure.  3. The mitral valve is grossly normal. Trivial mitral valve regurgitation. No evidence of mitral stenosis.  4. The aortic valve is grossly normal. Aortic valve regurgitation is not visualized. No aortic stenosis is present. FINDINGS  Left Ventricle: Left ventricular ejection fraction, by estimation, is 65 to 70%. The left ventricle has normal function. The left ventricle has no regional wall motion abnormalities. The left ventricular internal cavity size was normal in size. There is  mild left ventricular hypertrophy. Left ventricular diastolic parameters are consistent with Grade I diastolic dysfunction (impaired relaxation). Right Ventricle: The right ventricular size is normal. No increase in right ventricular wall thickness. Right ventricular systolic function is normal. Tricuspid regurgitation signal is inadequate for assessing PA pressure. Left Atrium: Left atrial size was normal in size. Right Atrium: Right atrial size was normal in size. Pericardium: There is no evidence of pericardial effusion. Mitral Valve: The mitral valve is grossly normal. Trivial mitral valve regurgitation. No evidence of mitral valve stenosis. Tricuspid Valve: The tricuspid valve is normal in structure. Tricuspid valve regurgitation is not demonstrated. No evidence of tricuspid stenosis. Aortic Valve: The aortic valve is grossly normal. Aortic valve regurgitation is not visualized. No aortic stenosis is present. Aortic valve mean gradient measures 3.0 mmHg. Aortic valve peak gradient measures 4.9 mmHg. Aortic valve area, by VTI measures 2.92 cm. Pulmonic Valve: The pulmonic valve was normal in structure. Pulmonic valve regurgitation is trivial. No evidence of pulmonic stenosis. Aorta: The aortic root is normal in size and structure. Venous: The inferior vena cava was not well visualized. IAS/Shunts: The interatrial septum  was not well visualized.  LEFT VENTRICLE PLAX 2D LVIDd:         2.90 cm   Diastology LVIDs:         2.10 cm   LV e' medial:    7.36 cm/s LV PW:         1.10 cm   LV E/e' medial:  7.2 LV IVS:        1.10 cm   LV e' lateral:   6.48 cm/s LVOT diam:     2.00 cm   LV E/e' lateral: 8.2 LV SV:         38 LV SV Index:   19 LVOT Area:     3.14 cm  RIGHT VENTRICLE RV S  prime:     14.50 cm/s TAPSE (M-mode): 1.5 cm LEFT ATRIUM             Index LA diam:        3.10 cm 1.57 cm/m LA Vol (A2C):   18.5 ml 9.36 ml/m LA Vol (A4C):   25.0 ml 12.64 ml/m LA Biplane Vol: 22.5 ml 11.38 ml/m  AORTIC VALVE                    PULMONIC VALVE AV Area (Vmax):    2.80 cm     PV Vmax:       1.25 m/s AV Area (Vmean):   2.78 cm     PV Peak grad:  6.2 mmHg AV Area (VTI):     2.92 cm AV Vmax:           111.00 cm/s AV Vmean:          75.700 cm/s AV VTI:            0.130 m AV Peak Grad:      4.9 mmHg AV Mean Grad:      3.0 mmHg LVOT Vmax:         99.00 cm/s LVOT Vmean:        67.100 cm/s LVOT VTI:          0.121 m LVOT/AV VTI ratio: 0.93  AORTA Ao Root diam: 2.60 cm Ao Asc diam:  2.70 cm MITRAL VALVE MV Area (PHT): 3.46 cm    SHUNTS MV Decel Time: 219 msec    Systemic VTI:  0.12 m MV E velocity: 52.90 cm/s  Systemic Diam: 2.00 cm MV A velocity: 81.00 cm/s MV E/A ratio:  0.65 Cherlynn Kaiser MD Electronically signed by Cherlynn Kaiser MD Signature Date/Time: 03/22/2022/9:42:10 AM    Final     Labs: Recent Labs    03/25/22 0405 03/26/22 0400  WBC 5.7 4.4  RBC 3.07* 3.03*  HCT 27.6* 27.4*  PLT 102* 104*   Recent Labs    03/25/22 0405 03/26/22 0400  NA 134* 132*  K 3.0* 3.0*  CL 98 98  CO2 23 24  BUN 12 9  CREATININE 0.67 0.54  GLUCOSE 98 83  CALCIUM 7.9* 7.6*   No results for input(s): "LABPT", "INR" in the last 72 hours.  Review of Systems: ROS as detailed in HPI  Physical Exam: Body mass index is 30.17 kg/m.  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 Per family, this has been known for past 2 years with gradually progressive symptoms. Reviewed these findings with arthoplasty colleagues as well -she will require eventual right knee revision arthroplasty when medically cleared -in interim, she may be touch down weightbearing and walker with 1-2 person support at all times -follow up with her arthroplasty surgeon or with our arthroplasty team at [facility], MD Orthopaedic Surgeon [facility] [phone]  The risks and benefits were presented and reviewed. The risks due to hardware failure/irritation, new/persistent infection, stiffness, nerve/vessel/tendon injury, nonunion/malunion, wound healing issues, allograft usage,  failure of surgery, possibility of external fixation, possibility of future fusion, possibility of delayed definitive surgery, need for further surgery, thromboembolic events, anesthesia/medical complications, amputation, death among others were discussed.

## 2022-03-27 NOTE — Telephone Encounter (Signed)
Patient is still admitted in the hospital.

## 2022-03-27 NOTE — Plan of Care (Signed)
  Problem: Education: Goal: Knowledge of General Education information will improve Description: Including pain rating scale, medication(s)/side effects and non-pharmacologic comfort measures Outcome: Not Progressing   Problem: Health Behavior/Discharge Planning: Goal: Ability to manage health-related needs will improve Outcome: Not Progressing   Problem: Activity: Goal: Risk for activity intolerance will decrease Outcome: Not Progressing   Problem: Nutrition: Goal: Adequate nutrition will be maintained Outcome: Not Progressing   Problem: Elimination: Goal: Will not experience complications related to bowel motility Outcome: Not Progressing

## 2022-03-27 NOTE — Progress Notes (Signed)
PROGRESS NOTE    Sarah Wu  [id number] DOB: 1951-02-04 DOA: 03/21/2022 PCP: Levin Erp, DO   Brief Narrative:   72 y.o. female with medical history significant of prediabetes, osteoarthritisStatus post right total knee replacement, fibroid tumor, hyperlipidemia, hypertension, postmenopausal bleeding, peripheral neuropathy, stage IV uterine cancer with pancytopenia who was transferred to this facility from [facility] where she has been treated for the past 2 days due to C. difficile colitis, electrolyte abnormalities, sinus tachycardia with QT prolongation that has now converted into atrial fibrillation with RVR . Cardiology consulted and patient started on IV amiodarone and IV heparin. She was transitioned to oral amiodarone and eliquis. X-ray and CT of the right knee shows possible loosening of previous prosthetic knee, orthopedic consulted.  Orthopedic recommending eventually will need surgical intervention once medically stable.   Assessment & Plan:  Principal Problem:   C. difficile colitis Active Problems:   Essential hypertension   Prediabetes   Pure hypercholesterolemia   Peripheral neuropathy due to chemotherapy (HCC)   Pressure injury of skin   Atrial fibrillation with RVR (HCC)   Malnutrition of moderate degree    Pancolitis secondary to C. difficile infection -Currently on Dificid, started on 2/10.  Would plan for total of 10-14 days depending on how her symptoms progress.  Symptomatic management, IV fluids as necessary.  Diet as tolerated.  Encourage oral intake.   Atrial fibrillation with RVR, Improved -Seen by [facility] cardiology.  Currently on amiodarone, p.o. metoprolol and anticoagulation. -TSH within normal limits. F/u with PCP   Prolonged QTc  Optimize electrolytes   Hypokalemia/hypokalemia, secondary to GI losses - As needed repletion  Abnormal right knee x-ray - X-ray and CT scan of the right knee concerns for loosening of  previous prosthetic hardware.  Seen by orthopedic, eventually will require surgical revision once medically stable.  In the meantime advised touchdown weightbearing with knee immobilizer and walker with 1-2 person support at all times.   Mild thrombocytopenia Acquired pancytopenia -Continue to monitor.  No obvious evidence of gross bleeding.  Transfuse as necessary Status post PRBC transfusion 2/13 Hemoccult positive-expected in setting of colitis   Stage 4 Uterine cancer S/p robotic assisted laparoscopic total hysterectomy with bilateral salpingo-oophorectomy. S/p reduced dose of chemotherapy completed 6 cycles.  Follows up with Dr. Alvy Bimler. Peripheral neuropathy from chemotherapy  Patient will really benefit from skilled nursing facility as she is recovering from her active infection and eventually she will need right knee surgery.  Timing of surgery will be determined by her medical recovery and stabilization   DVT prophylaxis: Eliquis Code Status: Full code Family Communication: Daughters are present at bedside  Status is: Inpatient Remains inpatient appropriate because: Still having significant amount of diarrhea.   Nutritional status    Signs/Symptoms: mild fat depletion, mild muscle depletion  Interventions: Refer to RD note for recommendations, Magic cup, MVI  Body mass index is 30.17 kg/m.  Pressure Injury 03/21/22 Sacrum Mid Stage 3 -  Full thickness tissue loss. Subcutaneous fat may be visible but bone, tendon or muscle are NOT exposed. Red (Active)  03/21/22 0800  Location: Sacrum  Location Orientation: Mid  Staging: Stage 3 -  Full thickness tissue loss. Subcutaneous fat may be visible but bone, tendon or muscle are NOT exposed.  Wound Description (Comments): Red  Present on Admission: Yes     Subjective: Patient seen and examined at bedside.  Still having diarrhea, same amount of episodes as yesterday but slight improvement.  Examination: Constitutional:  Not  in acute distress Respiratory: Clear to auscultation bilaterally Cardiovascular: Normal sinus rhythm, no rubs Abdomen: Nontender nondistended good bowel sounds Musculoskeletal: No edema noted Skin: No rashes seen Neurologic: CN 2-12 grossly intact.  And nonfocal Psychiatric: Normal judgment and insight. Alert and oriented x 3. Normal mood.  Right chest wall port in place External catheter Rectal tube  Objective: Vitals:   03/26/22 1113 03/26/22 1122 03/26/22 1951 03/27/22 0606  BP:  117/68 122/70 114/62  Pulse:  93 98 94  Resp:  16 (!) 24 (!) 24  Temp:  98.2 F (36.8 C) 98.2 F (36.8 C) 98.3 F (36.8 C)  TempSrc:  Oral Oral Oral  SpO2:  95% 96% 100%  Weight: 90 kg     Height:        Intake/Output Summary (Last 24 hours) at 03/27/2022 0738 Last data filed at 03/27/2022 [id number] Gross per 24 hour  Intake 1195.81 ml  Output 1350 ml  Net -154.19 ml   Filed Weights   03/23/22 2016 03/26/22 1113  Weight: 82.2 kg 90 kg     Data Reviewed:   CBC: Recent Labs  Lab 03/23/22 0600 03/24/22 0330 03/25/22 0405 03/26/22 0400 03/27/22 0245  WBC 6.5 5.1 5.7 4.4 4.0  HGB 7.9* 7.3* 8.8* 8.7* 8.4*  HCT 24.9* 23.0* 27.6* 27.4* 26.6*  MCV 89.2 90.6 89.9 90.4 90.5  PLT 117* 101* 102* 104* [id number]*   Basic Metabolic Panel: Recent Labs  Lab 03/23/22 0600 03/23/22 1509 03/24/22 0330 03/25/22 0405 03/25/22 1240 03/26/22 0400 03/27/22 0245  NA 133*  --  134* 134*  --  132* 134*  K 2.9*  --  3.0* 3.0*  --  3.0* 3.0*  CL 97*  --  99 98  --  98 98  CO2 23  --  24 23  --  24 25  GLUCOSE 100*  --  87 98  --  83 82  BUN 24*  --  19 12  --  9 6*  CREATININE 1.03*  --  0.85 0.67  --  0.54 0.58  CALCIUM 7.9*  --  7.7* 7.9*  --  7.6* 7.6*  MG 2.0 1.6* 1.9 1.4*  --  1.5* 1.5*  PHOS 2.4*  --  2.8 3.4 3.7 3.7 3.2   GFR: Estimated Creatinine Clearance: 74.6 mL/min (by C-G formula based on SCr of 0.58 mg/dL). Liver Function Tests: Recent Labs  Lab 03/21/22 0621 03/22/22 0235  AST  37 29  ALT 23 22  ALKPHOS 62 66  BILITOT 1.6* 1.4*  PROT 6.2* 6.1*  ALBUMIN 2.2* 2.1*   No results for input(s): "LIPASE", "AMYLASE" in the last 168 hours. No results for input(s): "AMMONIA" in the last 168 hours. Coagulation Profile: No results for input(s): "INR", "PROTIME" in the last 168 hours. Cardiac Enzymes: No results for input(s): "CKTOTAL", "CKMB", "CKMBINDEX", "TROPONINI" in the last 168 hours. BNP (last 3 results) No results for input(s): "PROBNP" in the last 8760 hours. HbA1C: No results for input(s): "HGBA1C" in the last 72 hours. CBG: Recent Labs  Lab 03/25/22 1156 03/25/22 1743 03/25/22 2327 03/26/22 0722 03/26/22 1117  GLUCAP 99 87 89 76 90   Lipid Profile: No results for input(s): "CHOL", "HDL", "LDLCALC", "TRIG", "CHOLHDL", "LDLDIRECT" in the last 72 hours. Thyroid Function Tests: No results for input(s): "TSH", "T4TOTAL", "FREET4", "T3FREE", "THYROIDAB" in the last 72 hours. Anemia Panel: Recent Labs    03/24/22 1244  VITAMINB12 5,396*  FOLATE 12.4  FERRITIN 1,373*  TIBC 151*  IRON 36  RETICCTPCT 1.6   Sepsis Labs: No results for input(s): "PROCALCITON", "LATICACIDVEN" in the last 168 hours.  Recent Results (from the past 240 hour(s))  MRSA Next Gen by PCR, Nasal     Status: None   Collection Time: 03/21/22  6:04 AM   Specimen: Nasal Mucosa; Nasal Swab  Result Value Ref Range Status   MRSA by PCR Next Gen NOT DETECTED NOT DETECTED Final    Comment: (NOTE) The GeneXpert MRSA Assay (FDA approved for NASAL specimens only), is one component of a comprehensive MRSA colonization surveillance program. It is not intended to diagnose MRSA infection nor to guide or monitor treatment for MRSA infections. Test performance is not FDA approved in patients less than 63 years old. Performed at [facility], [place] [address], [place], [place] [address]   Culture, blood (Routine X 2) w Reflex to ID Panel     Status: None (Preliminary  result)   Collection Time: 03/24/22 12:44 PM   Specimen: BLOOD  Result Value Ref Range Status   Specimen Description   Final    BLOOD BLOOD LEFT ARM Performed at [facility] [address], [place], [place] [address]    Special Requests   Final    BOTTLES DRAWN AEROBIC ONLY Blood Culture adequate volume Performed at [facility] [address] [place] [address], [place], [place] [address]    Culture   Final    NO GROWTH 2 DAYS Performed at [facility] [address] [place] [address], [place], [place] [address]    Report Status PENDING  Incomplete  Culture, blood (Routine X 2) w Reflex to ID Panel     Status: None (Preliminary result)   Collection Time: 03/24/22 12:44 PM   Specimen: BLOOD  Result Value Ref Range Status   Specimen Description   Final    BLOOD BLOOD LEFT HAND Performed at [facility] [address], [place], [place] [address]    Special Requests   Final    BOTTLES DRAWN AEROBIC ONLY Blood Culture adequate volume Performed at [facility] [address] [place], [place] [address]    Culture   Final    NO GROWTH 2 DAYS Performed at [facility] [address], [place], [place] [address]    Report Status PENDING  Incomplete         Radiology Studies: CT KNEE RIGHT WO CONTRAST  Result Date: 03/25/2022 CLINICAL DATA:  Loose body, knee. Probable loosening and subsidence of the femoral component. EXAM: CT OF THE RIGHT KNEE WITHOUT CONTRAST TECHNIQUE: Multidetector CT imaging of the right knee was performed according to the standard protocol. Multiplanar CT image reconstructions were also generated. RADIATION DOSE REDUCTION: This exam was performed according to the departmental dose-optimization program which includes automated exposure control, adjustment of the mA and/or kV according to patient size and/or use of iterative reconstruction technique. COMPARISON:  Radiographs same date.  No other comparison  studies. FINDINGS: Bones/Joint/Cartilage Status post right total knee arthroplasty (bilateral total knee arthroplasty on the scout image). As seen on the earlier radiographs, there is an abnormal orientation of the femoral component relative to the distal femur. There is some lucency between the femur and the femoral component which could indicate loosening. The femoral component also appears posterolaterally displaced relative to the distal femoral metaphysis. On the coronal images, there is cortical step-off along the lateral femoral epicondyle (see coronal image 68/6). These findings may be chronic and related to an old  partially healed fracture. Bone detail is limited by artifact from the arthroplasty, and an acute fracture cannot be completely excluded in the appropriate clinical context (such as recent fall or acute pain). The tibial component appears intact without loosening. The proximal tibia, fibula and patella appear intact. The bones are diffusely demineralized. No significant joint effusion identified. Ligaments Suboptimally assessed by CT. Muscles and Tendons Generalized muscular atrophy. The extensor mechanism appears intact, although the patellar tendon is partly obscured by artifact. No evidence of intramuscular hematoma or other focal fluid collection. Soft tissues Generalized subcutaneous edema without evidence of focal fluid collection, unexpected foreign body or soft tissue emphysema. IMPRESSION: 1. Abnormal orientation of the femoral component of the right total knee arthroplasty as seen on earlier radiographs. The femoral component appears posterolaterally displaced relative to the distal femoral metaphysis and there is possible loosening. 2. Cortical step-off along the lateral femoral epicondyle may be chronic and related to an old partially healed fracture. Bone detail is limited by artifact from the arthroplasty, and an acute fracture cannot be completely excluded in the appropriate  clinical context. 3. The tibial component appears intact without loosening. 4. Generalized muscular atrophy. 5. Generalized subcutaneous edema without evidence of focal fluid collection, unexpected foreign body or soft tissue emphysema. Electronically Signed   By: Richardean Sale M.D.   On: 03/25/2022 18:15   DG Knee Complete 4 Views Right  Result Date: 03/25/2022 CLINICAL DATA:  Previous knee replacement with pain EXAM: RIGHT KNEE - COMPLETE 4+ VIEW COMPARISON:  None Available. FINDINGS: Patient has had previous total knee arthroplasty. No abnormality seen relating to the tibial component. Probable loosening and subsidence of the femoral component, possibly with intra-articular fragmentation. Patellar component is not well seen but may also be abnormal. Joint effusion is present. IMPRESSION: Previous total knee arthroplasty. Probable loosening and subsidence of the femoral component, possibly with intra-articular fragmentation. Patellar component is not well seen but may also be abnormal. Joint effusion. Electronically Signed   By: Nelson Chimes M.D.   On: 03/25/2022 13:55        Scheduled Meds:  amiodarone  400 mg Oral BID   Followed by   Derrill Memo ON 03/30/2022] amiodarone  200 mg Oral BID   Followed by   Derrill Memo ON 04/06/2022] amiodarone  200 mg Oral Daily   apixaban  5 mg Oral BID   ascorbic acid  500 mg Oral Daily   atorvastatin  10 mg Oral QHS   Chlorhexidine Gluconate Cloth  6 each Topical Q0600   ferrous sulfate  325 mg Oral Q breakfast   fidaxomicin  200 mg Oral BID   metoprolol tartrate  12.5 mg Oral BID   multivitamin with minerals  1 tablet Oral Daily   sodium chloride flush  10-40 mL Intracatheter Q12H   Continuous Infusions:  famotidine (PEPCID) IV Stopped (03/26/22 1451)     LOS: 5 days   Time spent= 35 mins    Treyon Wymore Arsenio Loader, MD [facility]  If 7PM-7AM, please contact night-coverage  03/27/2022, 7:38 AM

## 2022-03-28 DIAGNOSIS — A0472 Enterocolitis due to Clostridium difficile, not specified as recurrent: Secondary | ICD-10-CM | POA: Diagnosis not present

## 2022-03-28 LAB — BASIC METABOLIC PANEL
Anion gap: 10 (ref 5–15)
BUN: 7 mg/dL — ABNORMAL LOW (ref 8–23)
CO2: 27 mmol/L (ref 22–32)
Calcium: 7.9 mg/dL — ABNORMAL LOW (ref 8.9–10.3)
Chloride: 98 mmol/L (ref 98–111)
Creatinine, Ser: 0.58 mg/dL (ref 0.44–1.00)
GFR, Estimated: >60 mL/min (ref >60)
Glucose, Bld: 91 mg/dL (ref 70–99)
Potassium: 3.2 mmol/L — ABNORMAL LOW (ref 3.5–5.1)
Sodium: 135 mmol/L (ref 135–145)

## 2022-03-28 LAB — CBC
HCT: 27.9 % — ABNORMAL LOW (ref 36.0–46.0)
Hemoglobin: 8.7 g/dL — ABNORMAL LOW (ref 12.0–15.0)
MCH: 28.4 pg (ref 26.0–34.0)
MCHC: 31.2 g/dL (ref 30.0–36.0)
MCV: 91.2 fL (ref 80.0–100.0)
Platelets: 124 10*3/uL — ABNORMAL LOW (ref 150–400)
RBC: 3.06 MIL/uL — ABNORMAL LOW (ref 3.87–5.11)
RDW: 18.4 % — ABNORMAL HIGH (ref 11.5–15.5)
WBC: 3.9 10*3/uL — ABNORMAL LOW (ref 4.0–10.5)
nRBC: 0 % (ref 0.0–0.2)

## 2022-03-28 LAB — PHOSPHORUS: Phosphorus: 3.5 mg/dL (ref 2.5–4.6)

## 2022-03-28 LAB — MAGNESIUM: Magnesium: 1.7 mg/dL (ref 1.7–2.4)

## 2022-03-28 MED ORDER — MAGNESIUM SULFATE 2 GM/50ML IV SOLN
2.0000 g | Freq: Once | INTRAVENOUS | Status: AC
  Administered 2022-03-28: 2 g via INTRAVENOUS
  Filled 2022-03-28: qty 50

## 2022-03-28 MED ORDER — POTASSIUM CHLORIDE 10 MEQ/100ML IV SOLN
10.0000 meq | INTRAVENOUS | Status: AC
  Administered 2022-03-28 (×5): 10 meq via INTRAVENOUS
  Filled 2022-03-28 (×5): qty 100

## 2022-03-28 NOTE — Progress Notes (Signed)
PROGRESS NOTE    Sarah Wu  [id number] DOB: 12-13-1950 DOA: 03/21/2022 PCP: Levin Erp, DO   Brief Narrative:   72 y.o. female with medical history significant of prediabetes, osteoarthritisStatus post right total knee replacement, fibroid tumor, hyperlipidemia, hypertension, postmenopausal bleeding, peripheral neuropathy, stage IV uterine cancer with pancytopenia who was transferred to this facility from [facility] where she has been treated for the past 2 days due to C. difficile colitis, electrolyte abnormalities, sinus tachycardia with QT prolongation that has now converted into atrial fibrillation with RVR . Cardiology consulted and patient started on IV amiodarone and IV heparin. She was transitioned to oral amiodarone and eliquis. X-ray and CT of the right knee shows possible loosening of previous prosthetic knee, orthopedic consulted.  Orthopedic recommending eventually will need surgical intervention once medically stable.   Assessment & Plan:  Principal Problem:   C. difficile colitis Active Problems:   Essential hypertension   Prediabetes   Pure hypercholesterolemia   Peripheral neuropathy due to chemotherapy (HCC)   Pressure injury of skin   Atrial fibrillation with RVR (HCC)   Malnutrition of moderate degree    Pancolitis secondary to C. difficile infection -Currently on Dificid, started on 2/10.  Would plan for total of 10-14 days depending on how her symptoms progress.  Symptomatic management, IV fluids as necessary.  Diet as tolerated.  Encourage oral intake.   Atrial fibrillation with RVR, Improved -Seen by [facility] cardiology.  Currently on amiodarone, p.o. metoprolol and anticoagulation. -TSH within normal limits. F/u with PCP   Prolonged QTc  Optimize electrolytes   Hypokalemia/hypokalemia, secondary to GI losses - As needed repletion  Abnormal right knee x-ray - X-ray and CT scan of the right knee concerns for loosening of  previous prosthetic hardware.  Seen by orthopedic, eventually will require surgical revision once medically stable.  In the meantime advised touchdown weightbearing with knee immobilizer and walker with 1-2 person support at all times.   Mild thrombocytopenia Acquired pancytopenia -Continue to monitor.  No obvious evidence of gross bleeding.  Transfuse as necessary Status post PRBC transfusion 2/13 Hemoccult positive-expected in setting of colitis   Stage 4 Uterine cancer S/p robotic assisted laparoscopic total hysterectomy with bilateral salpingo-oophorectomy. S/p reduced dose of chemotherapy completed 6 cycles.  Follows up with Dr. Alvy Bimler. Peripheral neuropathy from chemotherapy  Patient will really benefit from skilled nursing facility as she is recovering from her active infection and eventually she will need right knee surgery.  Timing of surgery will be determined by her medical recovery and stabilization. Current PT recommendations-SNF   DVT prophylaxis: Eliquis Code Status: Full code Family Communication: Daughters are present at bedside  Status is: Inpatient Remains inpatient appropriate because: Still having significant amount of diarrhea.   Nutritional status    Signs/Symptoms: mild fat depletion, mild muscle depletion  Interventions: Refer to RD note for recommendations, Magic cup, MVI  Body mass index is 30.17 kg/m.  Pressure Injury 03/21/22 Sacrum Mid Stage 3 -  Full thickness tissue loss. Subcutaneous fat may be visible but bone, tendon or muscle are NOT exposed. Red (Active)  03/21/22 0800  Location: Sacrum  Location Orientation: Mid  Staging: Stage 3 -  Full thickness tissue loss. Subcutaneous fat may be visible but bone, tendon or muscle are NOT exposed.  Wound Description (Comments): Red  Present on Admission: Yes     Subjective: Seen and examined at bedside, no complaints.  Overall feels very weak.  Roughly 4 episodes of diarrhea in last 24  hours Spoke with the daughters at bedside.  They understand the knee surgery will be delayed due to her current medical issues.  They would like to follow-up with [facility] for any future surgical needs. For now although they prefer home with home health, would like patient to work with physical therapy with knee immobilizer in place and determine her functional status.  Examination: Constitutional: Not in acute distress Respiratory: Clear to auscultation bilaterally Cardiovascular: Normal sinus rhythm, no rubs Abdomen: Nontender nondistended good bowel sounds Musculoskeletal: No edema noted Skin: No rashes seen Neurologic: CN 2-12 grossly intact.  And nonfocal Psychiatric: Normal judgment and insight. Alert and oriented x 3. Normal mood.  Right chest wall port in place External catheter Rectal tube  Objective: Vitals:   03/27/22 0606 03/27/22 1206 03/27/22 2014 03/28/22 0403  BP: 114/62 108/75 109/70 105/64  Pulse: 94 97 93 87  Resp: (!) 24 17 (!) 22 11  Temp: 98.3 F (36.8 C) 98.6 F (37 C) 98.9 F (37.2 C) 98.6 F (37 C)  TempSrc: Oral Oral Oral Oral  SpO2: 100% 97% 97% 99%  Weight:      Height:        Intake/Output Summary (Last 24 hours) at 03/28/2022 0739 Last data filed at 03/28/2022 0403 Gross per 24 hour  Intake 579.83 ml  Output 1380 ml  Net -800.17 ml   Filed Weights   03/23/22 2016 03/26/22 1113  Weight: 82.2 kg 90 kg     Data Reviewed:   CBC: Recent Labs  Lab 03/24/22 0330 03/25/22 0405 03/26/22 0400 03/27/22 0245 03/28/22 0500  WBC 5.1 5.7 4.4 4.0 3.9*  HGB 7.3* 8.8* 8.7* 8.4* 8.7*  HCT 23.0* 27.6* 27.4* 26.6* 27.9*  MCV 90.6 89.9 90.4 90.5 91.2  PLT 101* 102* 104* 116* [id number]*   Basic Metabolic Panel: Recent Labs  Lab 03/24/22 0330 03/25/22 0405 03/25/22 1240 03/26/22 0400 03/27/22 0245 03/28/22 0500  NA 134* 134*  --  132* 134* 135  K 3.0* 3.0*  --  3.0* 3.0* 3.2*  CL 99 98  --  98 98 98  CO2 24 23  --  24 25 27  $ GLUCOSE 87 98   --  83 82 91  BUN 19 12  --  9 6* 7*  CREATININE 0.85 0.67  --  0.54 0.58 0.58  CALCIUM 7.7* 7.9*  --  7.6* 7.6* 7.9*  MG 1.9 1.4*  --  1.5* 1.5* 1.7  PHOS 2.8 3.4 3.7 3.7 3.2 3.5   GFR: Estimated Creatinine Clearance: 74.6 mL/min (by C-G formula based on SCr of 0.58 mg/dL). Liver Function Tests: Recent Labs  Lab 03/22/22 0235  AST 29  ALT 22  ALKPHOS 66  BILITOT 1.4*  PROT 6.1*  ALBUMIN 2.1*   No results for input(s): "LIPASE", "AMYLASE" in the last 168 hours. No results for input(s): "AMMONIA" in the last 168 hours. Coagulation Profile: No results for input(s): "INR", "PROTIME" in the last 168 hours. Cardiac Enzymes: No results for input(s): "CKTOTAL", "CKMB", "CKMBINDEX", "TROPONINI" in the last 168 hours. BNP (last 3 results) No results for input(s): "PROBNP" in the last 8760 hours. HbA1C: No results for input(s): "HGBA1C" in the last 72 hours. CBG: Recent Labs  Lab 03/25/22 1156 03/25/22 1743 03/25/22 2327 03/26/22 0722 03/26/22 1117  GLUCAP 99 87 89 76 90   Lipid Profile: No results for input(s): "CHOL", "HDL", "LDLCALC", "TRIG", "CHOLHDL", "LDLDIRECT" in the last 72 hours. Thyroid Function Tests: No results for input(s): "TSH", "T4TOTAL", "FREET4", "T3FREE", "THYROIDAB"  in the last 72 hours. Anemia Panel: No results for input(s): "VITAMINB12", "FOLATE", "FERRITIN", "TIBC", "IRON", "RETICCTPCT" in the last 72 hours.  Sepsis Labs: No results for input(s): "PROCALCITON", "LATICACIDVEN" in the last 168 hours.  Recent Results (from the past 240 hour(s))  MRSA Next Gen by PCR, Nasal     Status: None   Collection Time: 03/21/22  6:04 AM   Specimen: Nasal Mucosa; Nasal Swab  Result Value Ref Range Status   MRSA by PCR Next Gen NOT DETECTED NOT DETECTED Final    Comment: (NOTE) The GeneXpert MRSA Assay (FDA approved for NASAL specimens only), is one component of a comprehensive MRSA colonization surveillance program. It is not intended to diagnose MRSA  infection nor to guide or monitor treatment for MRSA infections. Test performance is not FDA approved in patients less than 60 years old. Performed at [facility], [place] [address], [place], [place] [address]   Culture, blood (Routine X 2) w Reflex to ID Panel     Status: None (Preliminary result)   Collection Time: 03/24/22 12:44 PM   Specimen: BLOOD  Result Value Ref Range Status   Specimen Description   Final    BLOOD BLOOD LEFT ARM Performed at [facility] [address] [place], [place] [address]    Special Requests   Final    BOTTLES DRAWN AEROBIC ONLY Blood Culture adequate volume Performed at [facility] [address], [place], [place] [address]    Culture   Final    NO GROWTH 3 DAYS Performed at [facility], [place] [address] [place], [place] [address]    Report Status PENDING  Incomplete  Culture, blood (Routine X 2) w Reflex to ID Panel     Status: None (Preliminary result)   Collection Time: 03/24/22 12:44 PM   Specimen: BLOOD  Result Value Ref Range Status   Specimen Description   Final    BLOOD BLOOD LEFT HAND Performed at [facility] [address] [place], [place] [address]    Special Requests   Final    BOTTLES DRAWN AEROBIC ONLY Blood Culture adequate volume Performed at [facility] [address], [place], [place] [address]    Culture   Final    NO GROWTH 3 DAYS Performed at [facility], [place] [address], [place], [place] [address]    Report Status PENDING  Incomplete         Radiology Studies: No results found.      Scheduled Meds:  amiodarone  400 mg Oral BID   Followed by   Derrill Memo ON 03/30/2022] amiodarone  200 mg Oral BID   Followed by   Derrill Memo ON 04/06/2022] amiodarone  200 mg Oral Daily   apixaban  5 mg Oral BID   ascorbic acid  500 mg Oral Daily   atorvastatin  10 mg Oral QHS   Chlorhexidine Gluconate Cloth  6 each  Topical Q0600   ferrous sulfate  325 mg Oral Q breakfast   fidaxomicin  200 mg Oral BID   metoprolol tartrate  12.5 mg Oral BID   multivitamin with minerals  1 tablet Oral Daily   sodium chloride flush  10-40 mL Intracatheter Q12H   Continuous Infusions:  famotidine (PEPCID) IV Stopped (03/27/22 1507)   magnesium sulfate bolus IVPB     potassium chloride       LOS: 6 days   Time spent= 35 mins  Joban Colledge Arsenio Loader, MD [facility]  If 7PM-7AM, please contact night-coverage  03/28/2022, 7:39 AM

## 2022-03-28 NOTE — TOC Progression Note (Signed)
Transition of Care [facility]) - Progression Note    Patient Details  Name: Sarah Wu MRN: [id number] Date of Birth: April 05, 1950  Transition of Care [facility]) CM/SW Contact  Rodney Booze, LCSW Phone Number: 03/28/2022, 11:54 AM  Clinical Narrative:    CSW has set up [facility] with [name], This CSW has also reached out to the family/ daughter. The daughter reports the mom going home first to heal than have her surgery. States she will need placement after the Surgery. TOC will follow for possible DC Monday per provider.        Expected Discharge Plan and Services                                               Social Determinants of Health (SDOH) Interventions SDOH Screenings   Food Insecurity: No Food Insecurity (03/23/2022)  Housing: Low Risk  (03/23/2022)  Transportation Needs: No Transportation Needs (03/23/2022)  Utilities: Not At Risk (03/23/2022)  Tobacco Use: Low Risk  (03/23/2022)    Readmission Risk Interventions     No data to display

## 2022-03-29 DIAGNOSIS — A0472 Enterocolitis due to Clostridium difficile, not specified as recurrent: Secondary | ICD-10-CM | POA: Diagnosis not present

## 2022-03-29 LAB — CBC
HCT: 27.4 % — ABNORMAL LOW (ref 36.0–46.0)
Hemoglobin: 8.5 g/dL — ABNORMAL LOW (ref 12.0–15.0)
MCH: 28.9 pg (ref 26.0–34.0)
MCHC: 31 g/dL (ref 30.0–36.0)
MCV: 93.2 fL (ref 80.0–100.0)
Platelets: 138 10*3/uL — ABNORMAL LOW (ref 150–400)
RBC: 2.94 MIL/uL — ABNORMAL LOW (ref 3.87–5.11)
RDW: 18.5 % — ABNORMAL HIGH (ref 11.5–15.5)
WBC: 3.5 10*3/uL — ABNORMAL LOW (ref 4.0–10.5)
nRBC: 0 % (ref 0.0–0.2)

## 2022-03-29 LAB — CULTURE, BLOOD (ROUTINE X 2)
Culture: NO GROWTH
Culture: NO GROWTH
Special Requests: ADEQUATE
Special Requests: ADEQUATE

## 2022-03-29 LAB — BASIC METABOLIC PANEL
Anion gap: 8 (ref 5–15)
BUN: 6 mg/dL — ABNORMAL LOW (ref 8–23)
CO2: 27 mmol/L (ref 22–32)
Calcium: 7.7 mg/dL — ABNORMAL LOW (ref 8.9–10.3)
Chloride: 99 mmol/L (ref 98–111)
Creatinine, Ser: 0.56 mg/dL (ref 0.44–1.00)
GFR, Estimated: >60 mL/min (ref >60)
Glucose, Bld: 93 mg/dL (ref 70–99)
Potassium: 2.9 mmol/L — ABNORMAL LOW (ref 3.5–5.1)
Sodium: 134 mmol/L — ABNORMAL LOW (ref 135–145)

## 2022-03-29 LAB — PHOSPHORUS: Phosphorus: 3.6 mg/dL (ref 2.5–4.6)

## 2022-03-29 LAB — MAGNESIUM: Magnesium: 1.3 mg/dL — ABNORMAL LOW (ref 1.7–2.4)

## 2022-03-29 MED ORDER — MAGNESIUM SULFATE 2 GM/50ML IV SOLN
2.0000 g | Freq: Once | INTRAVENOUS | Status: AC
  Administered 2022-03-29: 2 g via INTRAVENOUS
  Filled 2022-03-29: qty 50

## 2022-03-29 MED ORDER — POTASSIUM CHLORIDE 10 MEQ/100ML IV SOLN
10.0000 meq | INTRAVENOUS | Status: AC
  Administered 2022-03-29 (×6): 10 meq via INTRAVENOUS
  Filled 2022-03-29 (×6): qty 100

## 2022-03-29 MED ORDER — POTASSIUM CHLORIDE 20 MEQ PO PACK: 60.0000 meq | PACK | Freq: Once | ORAL | Status: DC

## 2022-03-29 MED ORDER — MAGNESIUM SULFATE 4 GM/100ML IV SOLN
4.0000 g | Freq: Once | INTRAVENOUS | Status: DC
  Filled 2022-03-29: qty 100

## 2022-03-29 MED ORDER — FAMOTIDINE 20 MG PO TABS
20.0000 mg | ORAL_TABLET | Freq: Every day | ORAL | Status: DC
  Administered 2022-03-29 – 2022-04-08 (×11): 20 mg via ORAL
  Filled 2022-03-29 (×11): qty 1

## 2022-03-29 MED ORDER — SODIUM CHLORIDE 0.9 % IV SOLN: INTRAVENOUS | Status: DC

## 2022-03-29 NOTE — Progress Notes (Signed)
PROGRESS NOTE    Sarah Wu  [id number] DOB: 09-18-1950 DOA: 03/21/2022 PCP: Levin Erp, DO   Brief Narrative:   72 y.o. female with medical history significant of prediabetes, osteoarthritisStatus post right total knee replacement, fibroid tumor, hyperlipidemia, hypertension, postmenopausal bleeding, peripheral neuropathy, stage IV uterine cancer with pancytopenia who was transferred to this facility from [facility] where she has been treated for the past 2 days due to C. difficile colitis, electrolyte abnormalities, sinus tachycardia with QT prolongation that has now converted into atrial fibrillation with RVR . Cardiology consulted and patient started on IV amiodarone and IV heparin. She was transitioned to oral amiodarone and eliquis. X-ray and CT of the right knee shows possible loosening of previous prosthetic knee, orthopedic consulted.  Orthopedic recommending eventually will need surgical intervention once medically stable.  Currently awaiting electrolytes to stabilize and her diarrhea to improve.   Assessment & Plan:  Principal Problem:   C. difficile colitis Active Problems:   Essential hypertension   Prediabetes   Pure hypercholesterolemia   Peripheral neuropathy due to chemotherapy (HCC)   Pressure injury of skin   Atrial fibrillation with RVR (HCC)   Malnutrition of moderate degree    Pancolitis secondary to C. difficile infection -Currently on Dificid, started on 2/10.  Would plan for total of 14 days depending on how her symptoms progress.  Symptomatic management, IV fluids as necessary.  Diet as tolerated.  Encourage oral intake.   Atrial fibrillation with RVR, Improved -Seen by [facility] cardiology.  Currently on amiodarone, p.o. metoprolol and anticoagulation. -TSH within normal limits. F/u with PCP   Prolonged QTc  Optimize electrolytes   Hypokalemia/hypokalemia, secondary to GI losses - Continue repletion  Abnormal right knee  x-ray - X-ray and CT scan of the right knee concerns for loosening of previous prosthetic hardware.  Seen by orthopedic, eventually will require surgical revision once medically stable.  Plans will be for outpatient intervention.  In the meantime advised touchdown weightbearing with knee immobilizer and walker with 1-2 person support at all times.   Mild thrombocytopenia Acquired pancytopenia -Continue to monitor.  No obvious evidence of gross bleeding.  Transfuse as necessary Status post PRBC transfusion 2/13 Hemoccult positive-expected in setting of colitis   Stage 4 Uterine cancer S/p robotic assisted laparoscopic total hysterectomy with bilateral salpingo-oophorectomy. S/p reduced dose of chemotherapy completed 6 cycles.  Follows up with Dr. Alvy Bimler. Peripheral neuropathy from chemotherapy  Patient will really benefit from skilled nursing facility as she is recovering from her active infection and eventually she will need right knee surgery.  Timing of surgery will be determined by her medical recovery and stabilization. Current PT recommendations-SNF but plans to go home with home health   DVT prophylaxis: Eliquis Code Status: Full code Family Communication: Daughters are present at bedside  Status is: Inpatient Remains inpatient appropriate because: Still having significant amount of diarrhea.  Replete electrolytes aggressively.   Nutritional status    Signs/Symptoms: mild fat depletion, mild muscle depletion  Interventions: Refer to RD note for recommendations, Magic cup, MVI  Body mass index is 30.17 kg/m.  Pressure Injury 03/21/22 Sacrum Mid Stage 3 -  Full thickness tissue loss. Subcutaneous fat may be visible but bone, tendon or muscle are NOT exposed. Red (Active)  03/21/22 0800  Location: Sacrum  Location Orientation: Mid  Staging: Stage 3 -  Full thickness tissue loss. Subcutaneous fat may be visible but bone, tendon or muscle are NOT exposed.  Wound Description  (Comments): Red  Present on Admission: Yes     Subjective: No complaints besides some bilateral lower extremity swelling.  Slightly poor oral intake. Had about 2-3 episodes of diarrhea per daughters.  Examination: Constitutional: Not in acute distress Respiratory: Clear to auscultation bilaterally Cardiovascular: Normal sinus rhythm, no rubs Abdomen: Nontender nondistended good bowel sounds Musculoskeletal: Trace bilateral lower extremity edema Skin: No rashes seen Neurologic: CN 2-12 grossly intact.  And nonfocal Psychiatric: Normal judgment and insight. Alert and oriented x 3. Normal mood.  Right chest wall port in place External catheter Rectal tube  Objective: Vitals:   03/28/22 0403 03/28/22 1507 03/28/22 2049 03/29/22 0446  BP: 105/64 113/69 110/67 116/72  Pulse: 87 88 92 88  Resp: 11 14  20  $ Temp: 98.6 F (37 C) 98.6 F (37 C) 98.6 F (37 C) 99.1 F (37.3 C)  TempSrc: Oral Oral Oral Oral  SpO2: 99% 96% 95% 100%  Weight:      Height:        Intake/Output Summary (Last 24 hours) at 03/29/2022 0736 Last data filed at 03/29/2022 [id number] Gross per 24 hour  Intake 783.09 ml  Output 1340 ml  Net -556.91 ml   Filed Weights   03/23/22 2016 03/26/22 1113  Weight: 82.2 kg 90 kg     Data Reviewed:   CBC: Recent Labs  Lab 03/25/22 0405 03/26/22 0400 03/27/22 0245 03/28/22 0500 03/29/22 0255  WBC 5.7 4.4 4.0 3.9* 3.5*  HGB 8.8* 8.7* 8.4* 8.7* 8.5*  HCT 27.6* 27.4* 26.6* 27.9* 27.4*  MCV 89.9 90.4 90.5 91.2 93.2  PLT 102* 104* 116* 124* 0000000*   Basic Metabolic Panel: Recent Labs  Lab 03/25/22 0405 03/25/22 1240 03/26/22 0400 03/27/22 0245 03/28/22 0500 03/29/22 0255  NA 134*  --  132* 134* 135 134*  K 3.0*  --  3.0* 3.0* 3.2* 2.9*  CL 98  --  98 98 98 99  CO2 23  --  24 25 27 27  $ GLUCOSE 98  --  83 82 91 93  BUN 12  --  9 6* 7* 6*  CREATININE 0.67  --  0.54 0.58 0.58 0.56  CALCIUM 7.9*  --  7.6* 7.6* 7.9* 7.7*  MG 1.4*  --  1.5* 1.5* 1.7 1.3*   PHOS 3.4 3.7 3.7 3.2 3.5 3.6   GFR: Estimated Creatinine Clearance: 74.6 mL/min (by C-G formula based on SCr of 0.56 mg/dL). Liver Function Tests: No results for input(s): "AST", "ALT", "ALKPHOS", "BILITOT", "PROT", "ALBUMIN" in the last 168 hours.  No results for input(s): "LIPASE", "AMYLASE" in the last 168 hours. No results for input(s): "AMMONIA" in the last 168 hours. Coagulation Profile: No results for input(s): "INR", "PROTIME" in the last 168 hours. Cardiac Enzymes: No results for input(s): "CKTOTAL", "CKMB", "CKMBINDEX", "TROPONINI" in the last 168 hours. BNP (last 3 results) No results for input(s): "PROBNP" in the last 8760 hours. HbA1C: No results for input(s): "HGBA1C" in the last 72 hours. CBG: Recent Labs  Lab 03/25/22 1156 03/25/22 1743 03/25/22 2327 03/26/22 0722 03/26/22 1117  GLUCAP 99 87 89 76 90   Lipid Profile: No results for input(s): "CHOL", "HDL", "LDLCALC", "TRIG", "CHOLHDL", "LDLDIRECT" in the last 72 hours. Thyroid Function Tests: No results for input(s): "TSH", "T4TOTAL", "FREET4", "T3FREE", "THYROIDAB" in the last 72 hours. Anemia Panel: No results for input(s): "VITAMINB12", "FOLATE", "FERRITIN", "TIBC", "IRON", "RETICCTPCT" in the last 72 hours.  Sepsis Labs: No results for input(s): "PROCALCITON", "LATICACIDVEN" in the last 168 hours.  Recent Results (from the past  240 hour(s))  MRSA Next Gen by PCR, Nasal     Status: None   Collection Time: 03/21/22  6:04 AM   Specimen: Nasal Mucosa; Nasal Swab  Result Value Ref Range Status   MRSA by PCR Next Gen NOT DETECTED NOT DETECTED Final    Comment: (NOTE) The GeneXpert MRSA Assay (FDA approved for NASAL specimens only), is one component of a comprehensive MRSA colonization surveillance program. It is not intended to diagnose MRSA infection nor to guide or monitor treatment for MRSA infections. Test performance is not FDA approved in patients less than 30 years old. Performed at [facility], [place] [address] [place], [place] [address]   Culture, blood (Routine X 2) w Reflex to ID Panel     Status: None (Preliminary result)   Collection Time: 03/24/22 12:44 PM   Specimen: BLOOD  Result Value Ref Range Status   Specimen Description   Final    BLOOD BLOOD LEFT ARM Performed at [place] [address] [place], [place] [address]    Special Requests   Final    BOTTLES DRAWN AEROBIC ONLY Blood Culture adequate volume Performed at [place] [address] [place], [place] [address]    Culture   Final    NO GROWTH 4 DAYS Performed at [facility], [place] [address] [place], [place] [address]    Report Status PENDING  Incomplete  Culture, blood (Routine X 2) w Reflex to ID Panel     Status: None (Preliminary result)   Collection Time: 03/24/22 12:44 PM   Specimen: BLOOD  Result Value Ref Range Status   Specimen Description   Final    BLOOD BLOOD LEFT HAND Performed at [place] [address], [place], [place] [address]    Special Requests   Final    BOTTLES DRAWN AEROBIC ONLY Blood Culture adequate volume Performed at [place] [address], [place], [place] [address]    Culture   Final    NO GROWTH 4 DAYS Performed at [facility], [place] [address] [place],  [address]    Report Status PENDING  Incomplete         Radiology Studies: No results found.      Scheduled Meds:  amiodarone  400 mg Oral BID   Followed by   Derrill Memo ON 03/30/2022] amiodarone  200 mg Oral BID   Followed by   Derrill Memo ON 04/06/2022] amiodarone  200 mg Oral Daily   apixaban  5 mg Oral BID   ascorbic acid  500 mg Oral Daily   atorvastatin  10 mg Oral QHS   Chlorhexidine Gluconate Cloth  6 each Topical Q0600   ferrous sulfate  325 mg Oral Q breakfast   fidaxomicin  200 mg Oral BID   metoprolol tartrate  12.5 mg Oral BID   multivitamin with minerals  1  tablet Oral Daily   sodium chloride flush  10-40 mL Intracatheter Q12H   Continuous Infusions:  sodium chloride 50 mL/hr at 03/29/22 0054   famotidine (PEPCID) IV Stopped (03/28/22 1440)   magnesium sulfate bolus IVPB     potassium chloride       LOS: 7 days   Time spent= 35 mins    Coleta Grosshans Arsenio Loader, MD [facility]  If 7PM-7AM, please contact night-coverage  03/29/2022, 7:36 AM

## 2022-03-30 ENCOUNTER — Ambulatory Visit (HOSPITAL_COMMUNITY): Payer: Medicare Other

## 2022-03-30 DIAGNOSIS — A0472 Enterocolitis due to Clostridium difficile, not specified as recurrent: Secondary | ICD-10-CM | POA: Diagnosis not present

## 2022-03-30 LAB — CBC
HCT: 27.7 % — ABNORMAL LOW (ref 36.0–46.0)
Hemoglobin: 8.6 g/dL — ABNORMAL LOW (ref 12.0–15.0)
MCH: 28.9 pg (ref 26.0–34.0)
MCHC: 31 g/dL (ref 30.0–36.0)
MCV: 93 fL (ref 80.0–100.0)
Platelets: 158 10*3/uL (ref 150–400)
RBC: 2.98 MIL/uL — ABNORMAL LOW (ref 3.87–5.11)
RDW: 18.7 % — ABNORMAL HIGH (ref 11.5–15.5)
WBC: 3.8 10*3/uL — ABNORMAL LOW (ref 4.0–10.5)
nRBC: 0 % (ref 0.0–0.2)

## 2022-03-30 LAB — BASIC METABOLIC PANEL
Anion gap: 7 (ref 5–15)
BUN: 5 mg/dL — ABNORMAL LOW (ref 8–23)
CO2: 26 mmol/L (ref 22–32)
Calcium: 7.6 mg/dL — ABNORMAL LOW (ref 8.9–10.3)
Chloride: 101 mmol/L (ref 98–111)
Creatinine, Ser: 0.52 mg/dL (ref 0.44–1.00)
GFR, Estimated: >60 mL/min (ref >60)
Glucose, Bld: 86 mg/dL (ref 70–99)
Potassium: 2.9 mmol/L — ABNORMAL LOW (ref 3.5–5.1)
Sodium: 134 mmol/L — ABNORMAL LOW (ref 135–145)

## 2022-03-30 LAB — MAGNESIUM
Magnesium: 1.6 mg/dL — ABNORMAL LOW (ref 1.7–2.4)
Magnesium: 1.8 mg/dL (ref 1.7–2.4)

## 2022-03-30 LAB — PHOSPHORUS: Phosphorus: 4.2 mg/dL (ref 2.5–4.6)

## 2022-03-30 LAB — POTASSIUM: Potassium: 3.3 mmol/L — ABNORMAL LOW (ref 3.5–5.1)

## 2022-03-30 MED ORDER — POTASSIUM CHLORIDE 10 MEQ/100ML IV SOLN
10.0000 meq | INTRAVENOUS | Status: AC
  Administered 2022-03-30 (×6): 10 meq via INTRAVENOUS
  Filled 2022-03-30 (×6): qty 100

## 2022-03-30 MED ORDER — POTASSIUM CHLORIDE CRYS ER 20 MEQ PO TBCR
40.0000 meq | EXTENDED_RELEASE_TABLET | Freq: Once | ORAL | Status: AC
  Administered 2022-03-30: 40 meq via ORAL
  Filled 2022-03-30: qty 2

## 2022-03-30 MED ORDER — MAGNESIUM SULFATE 4 GM/100ML IV SOLN
4.0000 g | Freq: Once | INTRAVENOUS | Status: AC
  Administered 2022-03-30: 4 g via INTRAVENOUS
  Filled 2022-03-30: qty 100

## 2022-03-30 MED ORDER — MAGNESIUM SULFATE 2 GM/50ML IV SOLN
2.0000 g | Freq: Once | INTRAVENOUS | Status: AC
  Administered 2022-03-30: 2 g via INTRAVENOUS
  Filled 2022-03-30: qty 50

## 2022-03-30 MED ORDER — CALCIUM GLUCONATE-NACL 2-0.675 GM/100ML-% IV SOLN
2.0000 g | Freq: Once | INTRAVENOUS | Status: AC
  Administered 2022-03-30: 2000 mg via INTRAVENOUS
  Filled 2022-03-30: qty 100

## 2022-03-30 NOTE — Progress Notes (Signed)
Occupational Therapy Treatment Patient Details Name: Sarah Wu MRN: [id number] DOB: February 10, 1950 Today's Date: 03/30/2022   History of present illness Patient is 72 y.o. female who was transferred [facility] from [facility] where she was treated for 2 days due to C. difficile colitis, electrolyte abnormalities, sinus tachycardia with QT prolongation that has now converted into atrial fibrillation with RVR. PMH significant for prediabetes, osteoarthritis, fibroid tumor, HLD, HTN, postmenopausal bleeding, peripheral neuropathy, stage IV uterine cancer with pancytopenia.   OT comments  Patient is unfortunately not progressing, but did agree to sit EOB for ~20 min to allow her daughters to bath her UB and wash her face. Pt did not initiate participation in her own self care despite encouragement and seems to prefer her dtrs to perform for her, including wiping her face. Pt also responded, "I can't" to most requests for mobility and required education and encouragement throughout. Difficult to ascertain whether this is related to cognitive deficits vs mood and pain.  Due to this, have changes pt's discharge plan to SNF. Patient remains limited by TDWB to RLE, buttocks pain with sitting, decreased motivation to participate in her own ADLs, generalized weakness and decreased activity tolerance along with deficits noted below. Pt continues to demonstrate fair/guarded rehab potential and could benefit from continued skilled OT to increase safety and independence with ADLs and functional transfers to allow pt to return home safely and reduce caregiver burden and fall risk.    Recommendations for follow up therapy are one component of a multi-disciplinary discharge planning process, led by the attending physician.  Recommendations may be updated based on patient status, additional functional criteria and insurance authorization.    Follow Up Recommendations  Skilled nursing-short term rehab (<3  hours/day)     Assistance Recommended at Discharge Frequent or constant Supervision/Assistance  Patient can return home with the following  Two people to help with walking and/or transfers;A lot of help with bathing/dressing/bathroom;Assistance with cooking/housework;Assist for transportation;Help with stairs or ramp for entrance   Equipment Recommendations       Recommendations for Other Services      Precautions / Restrictions Precautions Precautions: Fall;Other (comment) Precaution Comments: R LE TDWB with KI Required Braces or Orthoses: Knee Immobilizer - Right Knee Immobilizer - Right: On when out of bed or walking Restrictions Weight Bearing Restrictions: Yes RLE Weight Bearing: Touchdown weight bearing       Mobility Bed Mobility Overal bed mobility: Needs Assistance Bed Mobility: Supine to Sit, Sit to Supine     Supine to sit: Max assist Sit to supine: Max assist, +2 for physical assistance   General bed mobility comments: pt encouraged to engage, participate and use bed rails to assist for increased I and functional strength. Follows ~50% of instructions.    Transfers                         Balance Overall balance assessment: Needs assistance Sitting-balance support: No upper extremity supported, Feet supported Sitting balance-Leahy Scale: Fair Sitting balance - Comments: Prefers to hold self up with BUEs. Would place UEs in lap and raised at shoulders x1 to request but returns hands to bed.                                   ADL either performed or assessed with clinical judgement   ADL Overall ADL's : Needs assistance/impaired  Grooming: Sitting;Wash/dry face;Moderate assistance Grooming Details (indicate cue type and reason): Needs cues to initiate. Depends on 2 daughters in room to assist. Upper Body Bathing: Maximal assistance;Sitting Upper Body Bathing Details (indicate cue type and reason): Daughters performing while pt  EOB. Pt not intiaiting to participate. Lower Body Bathing: Total assistance;Bed level;+2 for physical assistance   Upper Body Dressing : Maximal assistance;Bed level   Lower Body Dressing: Total assistance;Bed level Lower Body Dressing Details (indicate cue type and reason): To don socks. Pt unable to kick either foot off bed to assist.   Toilet Transfer Details (indicate cue type and reason): Unable to stand. Pt educated on TDWB to RLE. Toileting- Clothing Manipulation and Hygiene: Total assistance;Bed level Toileting - Clothing Manipulation Details (indicate cue type and reason): Fecal management system and pure wick     Functional mobility during ADLs: Maximal assistance;+2 for physical assistance      Extremity/Trunk Assessment Upper Extremity Assessment Upper Extremity Assessment: Generalized weakness;RUE deficits/detail;LUE deficits/detail RUE Deficits / Details: Very limited shoulder ROM with apparent OA LUE Deficits / Details: Very limited shoulder ROM with apparent OA            Vision   Vision Assessment?: No apparent visual deficits   Perception     Praxis      Cognition Arousal/Alertness: Awake/alert Behavior During Therapy: Flat affect Overall Cognitive Status: Within Functional Limits for tasks assessed                                 General Comments: Learned dependence vs motivation vs cognitive deficits.  Responds, "I can't" to most requests for mobility despite showing that she can."        Exercises Other Exercises Other Exercises: Pt performed 5 reps of BUE pulls on lower bed rails in supine to raise back from bed. Performed 3 reps half bridge pushing with LLE. Performed 3 reps quad sets with 5sec hold. Stopped exercises due to RN to room for meds. Pt asked to perform this exercises ad lib throughout the day for strengthening to prepare for transfer training.    Shoulder Instructions       General Comments      Pertinent Vitals/  Pain       Pain Assessment Pain Assessment: Faces Pain Score:  (unable to give number despite cues/instruction) Faces Pain Scale: Hurts even more Pain Location: buttocks Pain Descriptors / Indicators: Grimacing, Sore, Tender Pain Intervention(s): Limited activity within patient's tolerance, Monitored during session, Repositioned (Pt refused side lying once back to supine despite education on importance for skin integrity.)  Home Living                                          Prior Functioning/Environment              Frequency  Min 2X/week        Progress Toward Goals  OT Goals(current goals can now be found in the care plan section)  Progress towards OT goals: Not progressing toward goals - comment  Acute Rehab OT Goals Patient Stated Goal: "I don't know." OT Goal Formulation: With patient/family Time For Goal Achievement: 04/09/22 Potential to Achieve Goals: [place] Discharge plan remains appropriate    Co-evaluation  AM-PAC OT "6 Clicks" Daily Activity     Outcome Measure   Help from another person eating meals?: A Little Help from another person taking care of personal grooming?: A Lot Help from another person toileting, which includes using toliet, bedpan, or urinal?: Total Help from another person bathing (including washing, rinsing, drying)?: A Lot Help from another person to put on and taking off regular upper body clothing?: A Lot Help from another person to put on and taking off regular lower body clothing?: Total 6 Click Score: 11    End of Session    OT Visit Diagnosis: Muscle weakness (generalized) (M62.81)   Activity Tolerance Patient limited by pain;Patient limited by fatigue   Patient Left in bed;with call bell/phone within reach;with bed alarm set;with family/visitor present;with nursing/sitter in room   Nurse Communication  (Rn to room)        Time: 1127-1206 OT Time Calculation (min): 39  min  Charges: OT General Charges $OT Visit: 1 Visit OT Evaluation $OT Eval Low Complexity: 1 Low OT Treatments $Self Care/Home Management : 8-22 mins $Therapeutic Activity: 8-22 mins  Anderson [place], [place] Office: [phone] 03/30/2022  Julien Girt 03/30/2022, 12:29 PM

## 2022-03-30 NOTE — Progress Notes (Signed)
PT Cancellation Note  Patient Details Name: Sarah Wu MRN: [id number] DOB: March 23, 1950   Cancelled Treatment:    Reason Eval/Treat Not Completed: Fatigue/lethargy limiting ability to participate. Pt declines, reports fatigued from OT this morning and wanting to rest now, requests PT check back tomorrow (2/20) morning.    Tori Jevaeh Shams PT, DPT 03/30/22, 1:32 PM

## 2022-03-30 NOTE — Telephone Encounter (Signed)
Patient is still admitted in the hospital.

## 2022-03-30 NOTE — Care Management Important Message (Signed)
Important Message  Patient Details IM Letter given. Name: Sarah Wu MRN: [id number] Date of Birth: 03/03/1950   Medicare Important Message Given:  Yes     Kerin Salen 03/30/2022, 12:00 PM

## 2022-03-30 NOTE — Progress Notes (Signed)
PROGRESS NOTE    Sarah Wu  [id number] DOB: October 15, 1950 DOA: 03/21/2022 PCP: Levin Erp, DO   Brief Narrative:   72 y.o. female with medical history significant of prediabetes, osteoarthritisStatus post right total knee replacement, fibroid tumor, hyperlipidemia, hypertension, postmenopausal bleeding, peripheral neuropathy, stage IV uterine cancer with pancytopenia who was transferred to this facility from [facility] where she has been treated for the past 2 days due to C. difficile colitis, electrolyte abnormalities, sinus tachycardia with QT prolongation that has now converted into atrial fibrillation with RVR . Cardiology consulted and patient started on IV amiodarone and IV heparin. She was transitioned to oral amiodarone and eliquis. X-ray and CT of the right knee shows possible loosening of previous prosthetic knee, orthopedic consulted.  Orthopedic recommending eventually will need surgical intervention once medically stable.  Currently awaiting electrolytes to stabilize and her diarrhea to improve.   Assessment & Plan:  Principal Problem:   C. difficile colitis Active Problems:   Essential hypertension   Prediabetes   Pure hypercholesterolemia   Peripheral neuropathy due to chemotherapy (HCC)   Pressure injury of skin   Atrial fibrillation with RVR (HCC)   Malnutrition of moderate degree    Pancolitis secondary to C. difficile infection -Currently on Dificid, started on 2/10.  Would plan for total of 14 days depending on how her symptoms progress.  Symptomatic management, IV fluids as necessary.  Diet as tolerated.  Encourage oral intake.  Some of her diarrhea had predated this infection related to her chemo and radiation.   Atrial fibrillation with RVR, Improved -Seen by [facility] cardiology.  Currently on amiodarone, p.o. metoprolol and anticoagulation. -TSH within normal limits. F/u with PCP   Prolonged QTc  Optimize electrolytes    Hypokalemia/hypokalemia, secondary to GI losses - Replete aggressively  Abnormal right knee x-ray - X-ray and CT scan of the right knee concerns for loosening of previous prosthetic hardware.  Seen by orthopedic, eventually will require surgical revision once medically stable.  Plans will be for outpatient intervention.  In the meantime advised touchdown weightbearing with knee immobilizer and walker with 1-2 person support at all times.   Mild thrombocytopenia Acquired pancytopenia -Continue to monitor.  No obvious evidence of gross bleeding.  Transfuse as necessary Status post PRBC transfusion 2/13 Hemoccult positive-expected in setting of colitis   Stage 4 Uterine cancer S/p robotic assisted laparoscopic total hysterectomy with bilateral salpingo-oophorectomy. S/p reduced dose of chemotherapy completed 6 cycles.  Follows up with Dr. Alvy Bimler. Peripheral neuropathy from chemotherapy  Patient will really benefit from skilled nursing facility as she is recovering from her active infection and eventually she will need right knee surgery.  Timing of surgery will be determined by her medical recovery and stabilization. Current PT recommendations-SNF but plans to go home with home health   DVT prophylaxis: Eliquis Code Status: Full code Family Communication: Daughters are present at bedside  Status is: Inpatient Remains inpatient appropriate because: Still having significant amount of diarrhea.  Replete electrolytes aggressively.   Nutritional status    Signs/Symptoms: mild fat depletion, mild muscle depletion  Interventions: Refer to RD note for recommendations, Magic cup, MVI  Body mass index is 30.17 kg/m.  Pressure Injury 03/21/22 Sacrum Mid Stage 3 -  Full thickness tissue loss. Subcutaneous fat may be visible but bone, tendon or muscle are NOT exposed. Red (Active)  03/21/22 0800  Location: Sacrum  Location Orientation: Mid  Staging: Stage 3 -  Full thickness tissue loss.  Subcutaneous fat may be  visible but bone, tendon or muscle are NOT exposed.  Wound Description (Comments): Red  Present on Admission: Yes     Subjective: Diarrhea improving still feels weak and has low electrolytes.  Patient really wishes to go home but I explained to her that it needs to be aggressively repleted before we can safely send her home.  Examination: Constitutional: Not in acute distress Respiratory: Clear to auscultation bilaterally Cardiovascular: Normal sinus rhythm, no rubs Abdomen: Nontender nondistended good bowel sounds Musculoskeletal: No edema noted Skin: No rashes seen Neurologic: CN 2-12 grossly intact.  And nonfocal Psychiatric: Normal judgment and insight. Alert and oriented x 3. Normal mood.  Right chest wall port in place External catheter Rectal tube  Objective: Vitals:   03/29/22 2353 03/30/22 0129 03/30/22 0321 03/30/22 0610  BP:    111/68  Pulse:    85  Resp: 15 19 17 17  $ Temp:    98.3 F (36.8 C)  TempSrc:    Oral  SpO2:    98%  Weight:      Height:        Intake/Output Summary (Last 24 hours) at 03/30/2022 1135 Last data filed at 03/30/2022 1000 Gross per 24 hour  Intake 240 ml  Output 1300 ml  Net -1060 ml   Filed Weights   03/23/22 2016 03/26/22 1113  Weight: 82.2 kg 90 kg     Data Reviewed:   CBC: Recent Labs  Lab 03/26/22 0400 03/27/22 0245 03/28/22 0500 03/29/22 0255 03/30/22 0424  WBC 4.4 4.0 3.9* 3.5* 3.8*  HGB 8.7* 8.4* 8.7* 8.5* 8.6*  HCT 27.4* 26.6* 27.9* 27.4* 27.7*  MCV 90.4 90.5 91.2 93.2 93.0  PLT 104* 116* 124* 138* 0000000   Basic Metabolic Panel: Recent Labs  Lab 03/26/22 0400 03/27/22 0245 03/28/22 0500 03/29/22 0255 03/30/22 0424  NA 132* 134* 135 134* 134*  K 3.0* 3.0* 3.2* 2.9* 2.9*  CL 98 98 98 99 101  CO2 24 25 27 27 26  $ GLUCOSE 83 82 91 93 86  BUN 9 6* 7* 6* 5*  CREATININE 0.54 0.58 0.58 0.56 0.52  CALCIUM 7.6* 7.6* 7.9* 7.7* 7.6*  MG 1.5* 1.5* 1.7 1.3* 1.6*  PHOS 3.7 3.2 3.5 3.6 4.2    GFR: Estimated Creatinine Clearance: 74.6 mL/min (by C-G formula based on SCr of 0.52 mg/dL). Liver Function Tests: No results for input(s): "AST", "ALT", "ALKPHOS", "BILITOT", "PROT", "ALBUMIN" in the last 168 hours.  No results for input(s): "LIPASE", "AMYLASE" in the last 168 hours. No results for input(s): "AMMONIA" in the last 168 hours. Coagulation Profile: No results for input(s): "INR", "PROTIME" in the last 168 hours. Cardiac Enzymes: No results for input(s): "CKTOTAL", "CKMB", "CKMBINDEX", "TROPONINI" in the last 168 hours. BNP (last 3 results) No results for input(s): "PROBNP" in the last 8760 hours. HbA1C: No results for input(s): "HGBA1C" in the last 72 hours. CBG: Recent Labs  Lab 03/25/22 1156 03/25/22 1743 03/25/22 2327 03/26/22 0722 03/26/22 1117  GLUCAP 99 87 89 76 90   Lipid Profile: No results for input(s): "CHOL", "HDL", "LDLCALC", "TRIG", "CHOLHDL", "LDLDIRECT" in the last 72 hours. Thyroid Function Tests: No results for input(s): "TSH", "T4TOTAL", "FREET4", "T3FREE", "THYROIDAB" in the last 72 hours. Anemia Panel: No results for input(s): "VITAMINB12", "FOLATE", "FERRITIN", "TIBC", "IRON", "RETICCTPCT" in the last 72 hours.  Sepsis Labs: No results for input(s): "PROCALCITON", "LATICACIDVEN" in the last 168 hours.  Recent Results (from the past 240 hour(s))  MRSA Next Gen by PCR, Nasal  Status: None   Collection Time: 03/21/22  6:04 AM   Specimen: Nasal Mucosa; Nasal Swab  Result Value Ref Range Status   MRSA by PCR Next Gen NOT DETECTED NOT DETECTED Final    Comment: (NOTE) The GeneXpert MRSA Assay (FDA approved for NASAL specimens only), is one component of a comprehensive MRSA colonization surveillance program. It is not intended to diagnose MRSA infection nor to guide or monitor treatment for MRSA infections. Test performance is not FDA approved in patients less than 70 years old. Performed at [facility], [place]  [address], [place], [place] [address]   Culture, blood (Routine X 2) w Reflex to ID Panel     Status: None   Collection Time: 03/24/22 12:44 PM   Specimen: BLOOD  Result Value Ref Range Status   Specimen Description   Final    BLOOD BLOOD LEFT ARM Performed at [facility] [address] [place], [place] [address]    Special Requests   Final    BOTTLES DRAWN AEROBIC ONLY Blood Culture adequate volume Performed at [facility] [address], [place], [place] [address]    Culture   Final    NO GROWTH 5 DAYS Performed at [facility], [place] [address], [place], [place] [address]    Report Status 03/29/2022 FINAL  Final  Culture, blood (Routine X 2) w Reflex to ID Panel     Status: None   Collection Time: 03/24/22 12:44 PM   Specimen: BLOOD  Result Value Ref Range Status   Specimen Description   Final    BLOOD BLOOD LEFT HAND Performed at [facility] [address], [place], [place] [address]    Special Requests   Final    BOTTLES DRAWN AEROBIC ONLY Blood Culture adequate volume Performed at [facility] [address], [place], [place] [address]    Culture   Final    NO GROWTH 5 DAYS Performed at [facility], [place] [address], [place], [place] [address]    Report Status 03/29/2022 FINAL  Final         Radiology Studies: No results found.      Scheduled Meds:  amiodarone  200 mg Oral BID   Followed by   Derrill Memo ON 04/06/2022] amiodarone  200 mg Oral Daily   apixaban  5 mg Oral BID   ascorbic acid  500 mg Oral Daily   atorvastatin  10 mg Oral QHS   Chlorhexidine Gluconate Cloth  6 each Topical Q0600   famotidine  20 mg Oral Daily   ferrous sulfate  325 mg Oral Q breakfast   fidaxomicin  200 mg Oral BID   metoprolol tartrate  12.5 mg Oral BID   multivitamin with minerals  1 tablet Oral Daily   sodium chloride flush  10-40 mL Intracatheter Q12H   Continuous Infusions:   calcium gluconate 2,000 mg (03/30/22 1052)   potassium chloride 10 mEq (03/30/22 1050)     LOS: 8 days   Time spent= 35 mins    Adiel Mcnamara Arsenio Loader, MD [facility]  If 7PM-7AM, please contact night-coverage  03/30/2022, 11:35 AM

## 2022-03-31 DIAGNOSIS — A0472 Enterocolitis due to Clostridium difficile, not specified as recurrent: Secondary | ICD-10-CM | POA: Diagnosis not present

## 2022-03-31 LAB — CBC
HCT: 28.4 % — ABNORMAL LOW (ref 36.0–46.0)
Hemoglobin: 9 g/dL — ABNORMAL LOW (ref 12.0–15.0)
MCH: 29.6 pg (ref 26.0–34.0)
MCHC: 31.7 g/dL (ref 30.0–36.0)
MCV: 93.4 fL (ref 80.0–100.0)
Platelets: 183 10*3/uL (ref 150–400)
RBC: 3.04 MIL/uL — ABNORMAL LOW (ref 3.87–5.11)
RDW: 18.9 % — ABNORMAL HIGH (ref 11.5–15.5)
WBC: 4.6 10*3/uL (ref 4.0–10.5)
nRBC: 0 % (ref 0.0–0.2)

## 2022-03-31 LAB — BASIC METABOLIC PANEL
Anion gap: 7 (ref 5–15)
BUN: 5 mg/dL — ABNORMAL LOW (ref 8–23)
CO2: 26 mmol/L (ref 22–32)
Calcium: 7.9 mg/dL — ABNORMAL LOW (ref 8.9–10.3)
Chloride: 102 mmol/L (ref 98–111)
Creatinine, Ser: 0.48 mg/dL (ref 0.44–1.00)
GFR, Estimated: >60 mL/min (ref >60)
Glucose, Bld: 93 mg/dL (ref 70–99)
Potassium: 3.6 mmol/L (ref 3.5–5.1)
Sodium: 135 mmol/L (ref 135–145)

## 2022-03-31 LAB — PHOSPHORUS: Phosphorus: 4 mg/dL (ref 2.5–4.6)

## 2022-03-31 LAB — MAGNESIUM: Magnesium: 1.9 mg/dL (ref 1.7–2.4)

## 2022-03-31 MED ORDER — POTASSIUM CHLORIDE 10 MEQ/100ML IV SOLN
10.0000 meq | INTRAVENOUS | Status: AC
  Administered 2022-03-31 (×4): 10 meq via INTRAVENOUS
  Filled 2022-03-31 (×4): qty 100

## 2022-03-31 NOTE — Progress Notes (Signed)
PROGRESS NOTE    Sarah Wu  [id number] DOB: 1951-01-18 DOA: 03/21/2022 PCP: Levin Erp, DO   Brief Narrative:   72 y.o. female with medical history significant of prediabetes, osteoarthritisStatus post right total knee replacement, fibroid tumor, hyperlipidemia, hypertension, postmenopausal bleeding, peripheral neuropathy, stage IV uterine cancer with pancytopenia who was transferred to this facility from [facility] where she has been treated for the past 2 days due to C. difficile colitis, electrolyte abnormalities, sinus tachycardia with QT prolongation that has now converted into atrial fibrillation with RVR . Cardiology consulted and patient started on IV amiodarone and IV heparin. She was transitioned to oral amiodarone and eliquis. X-ray and CT of the right knee shows possible loosening of previous prosthetic knee, orthopedic consulted.  Orthopedic recommending eventually will need surgical intervention once medically stable.  Currently awaiting electrolytes to stabilize and her diarrhea to improve.   Assessment & Plan:  Principal Problem:   C. difficile colitis Active Problems:   Essential hypertension   Prediabetes   Pure hypercholesterolemia   Peripheral neuropathy due to chemotherapy (HCC)   Pressure injury of skin   Atrial fibrillation with RVR (HCC)   Malnutrition of moderate degree    Pancolitis secondary to C. difficile infection -Currently on Dificid, started on 2/10.  Would plan for total of 14 days depending on how her symptoms progress.  Symptomatic management, IV fluids as necessary.  Diet as tolerated.  Encourage oral intake.  Some of her diarrhea had predated this infection related to her chemo and radiation.   Atrial fibrillation with RVR, Improved -Seen by [facility] cardiology.  Currently on amiodarone, p.o. metoprolol and anticoagulation. -TSH within normal limits. F/u with PCP   Prolonged QTc  Optimize electrolytes    Hypokalemia/hypokalemia, secondary to GI losses - Replete aggressively, electrolytes slowly improving  Abnormal right knee x-ray - X-ray and CT scan of the right knee concerns for loosening of previous prosthetic hardware.  Seen by orthopedic, eventually will require surgical revision once medically stable.  Plans will be for outpatient intervention.  In the meantime advised touchdown weightbearing with knee immobilizer and walker with 1-2 person support at all times.   Mild thrombocytopenia Acquired pancytopenia -Continue to monitor.  No obvious evidence of gross bleeding.  Transfuse as necessary Status post PRBC transfusion 2/13 Hemoccult positive-expected in setting of colitis   Stage 4 Uterine cancer S/p robotic assisted laparoscopic total hysterectomy with bilateral salpingo-oophorectomy. S/p reduced dose of chemotherapy completed 6 cycles.  Follows up with Dr. Alvy Bimler. Peripheral neuropathy from chemotherapy  Patient will really benefit from skilled nursing facility as she is recovering from her active infection and eventually she will need right knee surgery.  Timing of surgery will be determined by her medical recovery and stabilization. Current PT recommendations-SNF but plans to go home with home health  Due to patient's knee issues, she would benefit from hospital bed at home, wheelchair, walker and a bedside commode   DVT prophylaxis: Eliquis Code Status: Full code Family Communication: Daughters are present at bedside  Status is: Inpatient Remains inpatient appropriate because: Still having significant amount of diarrhea.  Replete electrolytes aggressively.  Hopefully home once the equipments have been delivered at home   Nutritional status    Signs/Symptoms: mild fat depletion, mild muscle depletion  Interventions: Refer to RD note for recommendations, Magic cup, MVI  Body mass index is 30.17 kg/m.  Pressure Injury 03/21/22 Sacrum Mid Stage 3 -  Full thickness  tissue loss. Subcutaneous fat may be visible  but bone, tendon or muscle are NOT exposed. Red (Active)  03/21/22 0800  Location: Sacrum  Location Orientation: Mid  Staging: Stage 3 -  Full thickness tissue loss. Subcutaneous fat may be visible but bone, tendon or muscle are NOT exposed.  Wound Description (Comments): Red  Present on Admission: Yes     Subjective: No complaints.  Overall feels weak.  Diarrhea appears to be stable  Examination: In constitutional: Not in acute distress Respiratory: Clear to auscultation bilaterally Cardiovascular: Normal sinus rhythm, no rubs Abdomen: Nontender nondistended good bowel sounds Musculoskeletal: No edema noted Skin: No rashes seen Neurologic: CN 2-12 grossly intact.  And nonfocal Psychiatric: Normal judgment and insight. Alert and oriented x 3. Normal mood.  Right chest wall port in place External catheter Rectal tube  Objective: Vitals:   03/30/22 0321 03/30/22 0610 03/30/22 1458 03/30/22 2010  BP:  111/68 108/72 125/78  Pulse:  85 78 84  Resp: 17 17 18 18  $ Temp:  98.3 F (36.8 C) 98.2 F (36.8 C) 98.5 F (36.9 C)  TempSrc:  Oral Oral Oral  SpO2:  98% 100% 98%  Weight:      Height:        Intake/Output Summary (Last 24 hours) at 03/31/2022 1208 Last data filed at 03/31/2022 0931 Gross per 24 hour  Intake 340 ml  Output 1300 ml  Net -960 ml   Filed Weights   03/23/22 2016 03/26/22 1113  Weight: 82.2 kg 90 kg     Data Reviewed:   CBC: Recent Labs  Lab 03/27/22 0245 03/28/22 0500 03/29/22 0255 03/30/22 0424 03/31/22 0316  WBC 4.0 3.9* 3.5* 3.8* 4.6  HGB 8.4* 8.7* 8.5* 8.6* 9.0*  HCT 26.6* 27.9* 27.4* 27.7* 28.4*  MCV 90.5 91.2 93.2 93.0 93.4  PLT 116* 124* 138* 158 [id number]   Basic Metabolic Panel: Recent Labs  Lab 03/27/22 0245 03/28/22 0500 03/29/22 0255 03/30/22 0424 03/30/22 1818 03/31/22 0316  NA 134* 135 134* 134*  --  135  K 3.0* 3.2* 2.9* 2.9* 3.3* 3.6  CL 98 98 99 101  --  102  CO2 25 27 27  26  $ --  26  GLUCOSE 82 91 93 86  --  93  BUN 6* 7* 6* 5*  --  5*  CREATININE 0.58 0.58 0.56 0.52  --  0.48  CALCIUM 7.6* 7.9* 7.7* 7.6*  --  7.9*  MG 1.5* 1.7 1.3* 1.6* 1.8 1.9  PHOS 3.2 3.5 3.6 4.2  --  4.0   GFR: Estimated Creatinine Clearance: 74.6 mL/min (by C-G formula based on SCr of 0.48 mg/dL). Liver Function Tests: No results for input(s): "AST", "ALT", "ALKPHOS", "BILITOT", "PROT", "ALBUMIN" in the last 168 hours.  No results for input(s): "LIPASE", "AMYLASE" in the last 168 hours. No results for input(s): "AMMONIA" in the last 168 hours. Coagulation Profile: No results for input(s): "INR", "PROTIME" in the last 168 hours. Cardiac Enzymes: No results for input(s): "CKTOTAL", "CKMB", "CKMBINDEX", "TROPONINI" in the last 168 hours. BNP (last 3 results) No results for input(s): "PROBNP" in the last 8760 hours. HbA1C: No results for input(s): "HGBA1C" in the last 72 hours. CBG: Recent Labs  Lab 03/25/22 1156 03/25/22 1743 03/25/22 2327 03/26/22 0722 03/26/22 1117  GLUCAP 99 87 89 76 90   Lipid Profile: No results for input(s): "CHOL", "HDL", "LDLCALC", "TRIG", "CHOLHDL", "LDLDIRECT" in the last 72 hours. Thyroid Function Tests: No results for input(s): "TSH", "T4TOTAL", "FREET4", "T3FREE", "THYROIDAB" in the last 72 hours. Anemia Panel: No  results for input(s): "VITAMINB12", "FOLATE", "FERRITIN", "TIBC", "IRON", "RETICCTPCT" in the last 72 hours.  Sepsis Labs: No results for input(s): "PROCALCITON", "LATICACIDVEN" in the last 168 hours.  Recent Results (from the past 240 hour(s))  Culture, blood (Routine X 2) w Reflex to ID Panel     Status: None   Collection Time: 03/24/22 12:44 PM   Specimen: BLOOD  Result Value Ref Range Status   Specimen Description   Final    BLOOD BLOOD LEFT ARM Performed at [facility] [address] [place], [place] [address]    Special Requests   Final    BOTTLES DRAWN AEROBIC ONLY Blood Culture adequate  volume Performed at [facility] [address] [place], [place] [address]    Culture   Final    NO GROWTH 5 DAYS Performed at [facility], [place] [address], [place], [place] [address]    Report Status 03/29/2022 FINAL  Final  Culture, blood (Routine X 2) w Reflex to ID Panel     Status: None   Collection Time: 03/24/22 12:44 PM   Specimen: BLOOD  Result Value Ref Range Status   Specimen Description   Final    BLOOD BLOOD LEFT HAND Performed at [facility] [address], [place], [place] [address]    Special Requests   Final    BOTTLES DRAWN AEROBIC ONLY Blood Culture adequate volume Performed at [facility] [address] [place], [place] [address]    Culture   Final    NO GROWTH 5 DAYS Performed at [facility], [place] [address] [place], [place] [address]    Report Status 03/29/2022 FINAL  Final         Radiology Studies: No results found.      Scheduled Meds:  amiodarone  200 mg Oral BID   Followed by   Derrill Memo ON 04/06/2022] amiodarone  200 mg Oral Daily   apixaban  5 mg Oral BID   ascorbic acid  500 mg Oral Daily   atorvastatin  10 mg Oral QHS   Chlorhexidine Gluconate Cloth  6 each Topical Q0600   famotidine  20 mg Oral Daily   ferrous sulfate  325 mg Oral Q breakfast   metoprolol tartrate  12.5 mg Oral BID   multivitamin with minerals  1 tablet Oral Daily   sodium chloride flush  10-40 mL Intracatheter Q12H   Continuous Infusions:  potassium chloride 10 mEq (03/31/22 1112)     LOS: 9 days   Time spent= 35 mins    Loleta Frommelt Arsenio Loader, MD [facility]  If 7PM-7AM, please contact night-coverage  03/31/2022, 12:08 PM

## 2022-03-31 NOTE — Progress Notes (Signed)
    Durable Medical Equipment  (From admission, onward)           Start     Ordered   03/31/22 1510  For home use only DME lightweight manual wheelchair with seat cushion  Once       Comments: Patient suffers from osteoarthritis which impairs their ability to perform daily activities like bathing, dressing, grooming, and toileting in the home.  A walker will not resolve  issue with performing activities of daily living. A wheelchair will allow patient to safely perform daily activities. Patient is not able to propel themselves in the home using a standard weight wheelchair due to arm weakness. Patient can self propel in the lightweight wheelchair. Length of need Lifetime. Accessories: elevating leg rests (ELRs), wheel locks, extensions and anti-tippers.   03/31/22 1512   03/31/22 1210  For home use only DME 4 wheeled rolling walker with seat  Once       Question:  Patient needs a walker to treat with the following condition  Answer:  Ambulatory dysfunction   03/31/22 1209   03/31/22 1157  For home use only DME wheelchair cushion (seat and back)  Once        03/31/22 1157   03/31/22 1157  For home use only DME Hospital bed  Once       Question Answer Comment  Length of Need Lifetime   Bed type Semi-electric      03/31/22 1157   03/31/22 1157  For home use only DME Bedside commode  Once       Question:  Patient needs a bedside commode to treat with the following condition  Answer:  Ambulatory dysfunction   03/31/22 1157

## 2022-03-31 NOTE — TOC Progression Note (Signed)
Transition of Care [facility]) - Progression Note   Patient Details  Name: Sarah Wu MRN: [id number] Date of Birth: 1950/05/31  [facility] of [facility] CM/SW [place], LCSW Phone Number: 03/31/2022, 2:08 PM  Clinical Narrative: Per daughter, Heath Lark, family wants patient to return home with [facility] in [place] and with DME (hospital bed and wheelchair). CSW followed up with Cindie with [facility] and [facility] can no longer accept this patient for any [facility] services due to limited staffing in the area. CSW made [facility] referrals to the following agencies:  [facility]: declined [facility]: out of service area [facility]: declined  CSW made DME referral to [place] with [facility]. The DME will not be delivered today as this request will go through another branch. DME narrative completed for a hospital bed, 3N1, and wheelchair. [employer] to provide update when DME will be delivered.  Social Determinants of Health (SDOH) Interventions SDOH Screenings   Food Insecurity: No Food Insecurity (03/23/2022)  Housing: Low Risk  (03/23/2022)  Transportation Needs: No Transportation Needs (03/23/2022)  Utilities: Not At Risk (03/23/2022)  Tobacco Use: Low Risk  (03/23/2022)   Readmission Risk Interventions     No data to display

## 2022-03-31 NOTE — Care Management (Addendum)
Patient suffers from peripheral neuropathy due to chemotherapy and pressure injury of skin which impairs their ability to perform daily activities like bathing, toileting, and dressing. A rolling walker will not resolve issue with performing activities of daily living. A wheelchair will allow patient to safely perform daily activities.  Patient is not able to propel themselves in the home using a standard weight wheelchair due to malnutrition. Patient can self propel in the lightweight wheelchair.  Patient has peripheral neuropathy due to chemotherapy and pressure injury of skin which requires arms and legs to be positioned in ways not feasible with a normal bed. Head must be elevated at least 30 or more.  Patient will need a 3N1 as she is limited to one room in the home.     Durable Medical Equipment  (From admission, onward)           Start     Ordered   03/31/22 1210  For home use only DME 4 wheeled rolling walker with seat  Once       Question:  Patient needs a walker to treat with the following condition  Answer:  Ambulatory dysfunction   03/31/22 1209   03/31/22 1157  For home use only DME wheelchair cushion (seat and back)  Once        03/31/22 1157   03/31/22 1157  For home use only DME Hospital bed  Once       Question Answer Comment  Length of Need Lifetime   Bed type Semi-electric      03/31/22 1157   03/31/22 1157  For home use only DME Bedside commode  Once       Question:  Patient needs a bedside commode to treat with the following condition  Answer:  Ambulatory dysfunction   03/31/22 1157

## 2022-03-31 NOTE — Progress Notes (Signed)
Physical Therapy Treatment Patient Details Name: Sarah Wu MRN: [id number] DOB: 1950-09-12 Today's Date: 03/31/2022   History of Present Illness Patient is 72 y.o. female who was transferred [facility] from [facility] where she was treated for 2 days due to C. difficile colitis, electrolyte abnormalities, sinus tachycardia with QT prolongation that has now converted into atrial fibrillation with RVR. PMH significant for prediabetes, osteoarthritis, fibroid tumor, HLD, HTN, postmenopausal bleeding, peripheral neuropathy, stage IV uterine cancer with pancytopenia.    PT Comments    Patient received in bed, cooperative with PT with encouragement from therapist, PT tech, and daughters. Able to sit at EOB for 5-7 minutes but very anxious and perseverated on PT wanting her to stand even though PT repeatedly educated that standing was not part of plan today- too weak to safely maintain TDWB with transfers. Family present and helped to encourage pt to participate but also dictated session, at one point one family member adjusted rectal tube despite PT educating that this should be done by RN; same family member also stated "I need your name because you're about to be in trouble" when PT simply put bed in mild trendelenburg position so that patient could be adjusted in bed. RN aware of family behaviors.  Pt did become a bit dizzy at EOB but BP was OK and she was also tightly squeezing her eyes shut. Educated family on use of chair position in the bed. Left in bed with all needs met, bed alarm active. Will continue efforts, remains appropriate for SNF.   Recommendations for follow up therapy are one component of a multi-disciplinary discharge planning process, led by the attending physician.  Recommendations may be updated based on patient status, additional functional criteria and insurance authorization.  Follow Up Recommendations  Skilled nursing-short term rehab (<3 hours/day) Can patient  physically be transported by private vehicle: No   Assistance Recommended at Discharge Frequent or constant Supervision/Assistance  Patient can return home with the following Two people to help with walking and/or transfers;Two people to help with bathing/dressing/bathroom;Assistance with cooking/housework;Direct supervision/assist for medications management;Direct supervision/assist for financial management;Assist for transportation;Help with stairs or ramp for entrance   Equipment Recommendations  Other (comment)    Recommendations for Other Services       Precautions / Restrictions Precautions Precautions: Fall;Other (comment) Precaution Comments: R LE TDWB with KI Required Braces or Orthoses: Knee Immobilizer - Right Knee Immobilizer - Right: On when out of bed or walking Restrictions Weight Bearing Restrictions: Yes RLE Weight Bearing: Touchdown weight bearing     Mobility  Bed Mobility   Bed Mobility: Supine to Sit, Sit to Supine     Supine to sit: Max assist, +2 for physical assistance Sit to supine: Max assist, +2 for physical assistance   General bed mobility comments: MaxAx2 for all bed mobility- physical assist and tube management    Transfers                   General transfer comment: DNT- unsafe given TDWB and gross weakness, unable to maintain precautions    Ambulation/Gait               General Gait Details: DNT, safety   Stairs             Wheelchair Mobility    Modified Rankin (Stroke Patients Only)       Balance Overall balance assessment: Needs assistance Sitting-balance support: Feet unsupported, Bilateral upper extremity supported Sitting balance-Leahy Scale: Fair Sitting balance -  Comments: tends to rely on BUEs for balance                                    Cognition Arousal/Alertness: Awake/alert Behavior During Therapy: Flat affect, Anxious Overall Cognitive Status: Within Functional Limits for  tasks assessed                                 General Comments: needed lots of encouragement for PT today, daughters present and helped to convince her to participate; repeatedly asked about standing despite PT educating this is not safe as she currently cannot maintain TDWB        Exercises      General Comments General comments (skin integrity, edema, etc.): daughters present throughout session      Pertinent Vitals/Pain Pain Assessment Pain Assessment: Faces Faces Pain Scale: Hurts little more Pain Location: site of rectal tube Pain Descriptors / Indicators: Aching, Discomfort Pain Intervention(s): Limited activity within patient's tolerance, Monitored during session    Home Living                          Prior Function            PT Goals (current goals can now be found in the care plan section) Acute Rehab PT Goals Patient Stated Goal: to get stonger and go home PT Goal Formulation: With patient Time For Goal Achievement: 04/08/22 Potential to Achieve Goals: Good Progress towards PT goals: Progressing toward goals    Frequency    Min 3X/week      PT Plan Current plan remains appropriate    Co-evaluation              AM-PAC PT "6 Clicks" Mobility   Outcome Measure  Help needed turning from your back to your side while in a flat bed without using bedrails?: A Lot Help needed moving from lying on your back to sitting on the side of a flat bed without using bedrails?: Total Help needed moving to and from a bed to a chair (including a wheelchair)?: Total Help needed standing up from a chair using your arms (e.g., wheelchair or bedside chair)?: Total Help needed to walk in hospital room?: Total Help needed climbing 3-5 steps with a railing? : Total 6 Click Score: 7    End of Session   Activity Tolerance: Patient limited by fatigue Patient left: in bed;with call bell/phone within reach;with family/visitor present;with bed  alarm set Nurse Communication: Mobility status;Other (comment) (family trying to adjust medical equipment such as rectal tube) PT Visit Diagnosis: Muscle weakness (generalized) (M62.81);Pain Pain - part of body:  (buttock)     Time: 1400-1420 PT Time Calculation (min) (ACUTE ONLY): 20 min  Charges:  $Therapeutic Activity: 8-22 mins                    Deniece Ree PT DPT PN2

## 2022-03-31 NOTE — Telephone Encounter (Signed)
Patient is still admitted in the hospital.

## 2022-03-31 NOTE — Plan of Care (Signed)
  Problem: Education: Goal: Knowledge of General Education information will improve Description: Including pain rating scale, medication(s)/side effects and non-pharmacologic comfort measures Outcome: Not Progressing   Problem: Health Behavior/Discharge Planning: Goal: Ability to manage health-related needs will improve Outcome: Not Progressing   Problem: Activity: Goal: Risk for activity intolerance will decrease Outcome: Not Progressing   Problem: Nutrition: Goal: Adequate nutrition will be maintained Outcome: Not Progressing   Problem: Elimination: Goal: Will not experience complications related to bowel motility Outcome: Not Progressing   Problem: Skin Integrity: Goal: Risk for impaired skin integrity will decrease Outcome: Not Progressing

## 2022-03-31 NOTE — Progress Notes (Signed)
Nutrition Follow-up  DOCUMENTATION CODES:   Non-severe (moderate) malnutrition in context of chronic illness  INTERVENTION:  - Upgrade to Regular diet per discussion with MD. - Magic cup BID with meals, each supplement provides 290 kcal and 9 grams of protein. - Encourage intake at all meals of protein rich food sources in addition to supplements.  - Daily multivitamin and 576m vitamin C to support wound healing.  - Monitor weight trends.  NUTRITION DIAGNOSIS:   Moderate Malnutrition related to chronic illness as evidenced by mild fat depletion, mild muscle depletion. *ongoing  GOAL:   Patient will meet greater than or equal to 90% of their needs *progressing  MONITOR:   PO intake, Supplement acceptance, Diet advancement, Weight trends  REASON FOR ASSESSMENT:   Malnutrition Screening Tool    ASSESSMENT:   72y.o. female with medical history significant of prediabetes, osteoarthritis, fibroid tumor, HLD, HTN, peripheral neuropathy, stage IV uterine cancer with pancytopenia [facility] due to C. difficile colitis, electrolyte abnormalities, sinus tachycardia with QT prolongation.  Met with patient and daughters at bedside this AM. Daughter reports patient simply doesn't like available hospital food. However, has still been trying to eat 3 meals a day to support meeting nutrition needs. She is documented to have consumed 50-100% of meals the past 3 days. Enjoys the [employer] has been consuming daily.   Patient has remained on a soft diet the past week. Discussed with MD and can advance patient to a regular diet to provide more options.  Medications reviewed and include: 5082mvitamin C (end date 3/15), MVI, iron  Labs reviewed:  -  Diet Order:   Diet Order             Diet regular Room service appropriate? Yes; Fluid consistency: Thin  Diet effective now                    EDUCATION NEEDS:  Education needs have been addressed  Skin:  Skin Assessment: Reviewed RN Assessment Skin Integrity Issues:: Stage III Stage III: Sacrum  Last BM:  2/20 - diarrhea  Height:  Ht Readings from Last 1 Encounters:  03/23/22 5' 8"$  (1.727 m)   Weight:  Wt Readings from Last 1 Encounters:  03/26/22 90 kg    BMI:  Body mass index is 30.17 kg/m.  Estimated Nutritional Needs:  Kcal:  [id number] Protein:  100-125 grams  Fluid:  >/= 2.3L    AsSamson FredericD, LDN For contact information, refer to [facility]

## 2022-04-01 ENCOUNTER — Inpatient Hospital Stay (HOSPITAL_COMMUNITY): Payer: Medicare Other

## 2022-04-01 DIAGNOSIS — A0472 Enterocolitis due to Clostridium difficile, not specified as recurrent: Secondary | ICD-10-CM | POA: Diagnosis not present

## 2022-04-01 LAB — CBC
HCT: 27.6 % — ABNORMAL LOW (ref 36.0–46.0)
Hemoglobin: 8.7 g/dL — ABNORMAL LOW (ref 12.0–15.0)
MCH: 29.5 pg (ref 26.0–34.0)
MCHC: 31.5 g/dL (ref 30.0–36.0)
MCV: 93.6 fL (ref 80.0–100.0)
Platelets: 195 10*3/uL (ref 150–400)
RBC: 2.95 MIL/uL — ABNORMAL LOW (ref 3.87–5.11)
RDW: 19.1 % — ABNORMAL HIGH (ref 11.5–15.5)
WBC: 4.2 10*3/uL (ref 4.0–10.5)
nRBC: 0 % (ref 0.0–0.2)

## 2022-04-01 LAB — BASIC METABOLIC PANEL
Anion gap: 7 (ref 5–15)
BUN: <5 mg/dL — ABNORMAL LOW (ref 8–23)
CO2: 27 mmol/L (ref 22–32)
Calcium: 7.8 mg/dL — ABNORMAL LOW (ref 8.9–10.3)
Chloride: 99 mmol/L (ref 98–111)
Creatinine, Ser: 0.52 mg/dL (ref 0.44–1.00)
GFR, Estimated: >60 mL/min (ref >60)
Glucose, Bld: 82 mg/dL (ref 70–99)
Potassium: 3.3 mmol/L — ABNORMAL LOW (ref 3.5–5.1)
Sodium: 133 mmol/L — ABNORMAL LOW (ref 135–145)

## 2022-04-01 LAB — MAGNESIUM: Magnesium: 1.3 mg/dL — ABNORMAL LOW (ref 1.7–2.4)

## 2022-04-01 LAB — PHOSPHORUS: Phosphorus: 4.2 mg/dL (ref 2.5–4.6)

## 2022-04-01 MED ORDER — LOPERAMIDE HCL 2 MG PO CAPS
4.0000 mg | ORAL_CAPSULE | Freq: Once | ORAL | Status: AC
  Administered 2022-04-01: 4 mg via ORAL
  Filled 2022-04-01: qty 2

## 2022-04-01 MED ORDER — POTASSIUM CHLORIDE 20 MEQ PO PACK
40.0000 meq | PACK | Freq: Once | ORAL | Status: AC
  Administered 2022-04-01: 40 meq via ORAL
  Filled 2022-04-01: qty 2

## 2022-04-01 MED ORDER — MAGNESIUM SULFATE 4 GM/100ML IV SOLN
4.0000 g | Freq: Once | INTRAVENOUS | Status: AC
  Administered 2022-04-01: 4 g via INTRAVENOUS
  Filled 2022-04-01: qty 100

## 2022-04-01 MED ORDER — CHOLESTYRAMINE LIGHT 4 G PO PACK
4.0000 g | PACK | ORAL | Status: DC
  Administered 2022-04-01 – 2022-04-02 (×6): 4 g via ORAL
  Filled 2022-04-01 (×6): qty 1

## 2022-04-01 MED ORDER — POTASSIUM CHLORIDE 10 MEQ/100ML IV SOLN
10.0000 meq | INTRAVENOUS | Status: AC
  Administered 2022-04-01 (×4): 10 meq via INTRAVENOUS
  Filled 2022-04-01 (×4): qty 100

## 2022-04-01 NOTE — Telephone Encounter (Signed)
Still admitted to hospital

## 2022-04-01 NOTE — Progress Notes (Signed)
Mobility Specialist - Progress Note   04/01/22 1127  Mobility  Activity  (Supine UE Exercises)  Level of Assistance Modified independent, requires aide device or extra time  Range of Motion/Exercises Right arm;Left arm;Passive;Active  Activity Response Tolerated well  $Mobility charge 1 Mobility   Pt received in bed and agreeable to UE exercises. Pt stated exercises were not difficult & felt fine doing them. Pt did requires minimal assistance for elbow flexion & extension. No other complaints during session. Pt left in bed with call bell in reach   Supine BUE Exercises: 5 reps each,  yellow resistance band  1) Elbow Flexion (Passive)  2) Elbow extension (Passive)  3) Horizontal Abduction (Active)    [facility]

## 2022-04-01 NOTE — TOC Progression Note (Addendum)
Transition of Care [facility]) - Progression Note    Patient Details  Name: Sarah Wu MRN: [id number] Date of Birth: Apr 29, 1950  Transition of Care [facility]) CM/SW Contact  Sarah Dine, RN Phone Number: 04/01/2022, 4:31 PM  Clinical Narrative:    Sarah Wu at [employer] and she says hospital bed scheduled for delivery today between 1230 - 1300; Sarah Wu also said family declined 3-N-1 b/c pt has one; spoke w/ pt's dtr Sarah Wu; she confirmed bed was delivered to home in [facility]; she also confirmed wheel chair w/ elevated leg rests were delivered to pt's room; Sarah Wu also says her brother did not sign for another wheel chair that delivery was sent to the home.        Expected Discharge Plan and Services                                               Social Determinants of Health (SDOH) Interventions SDOH Screenings   Food Insecurity: No Food Insecurity (03/23/2022)  Housing: Low Risk  (03/23/2022)  Transportation Needs: No Transportation Needs (03/23/2022)  Utilities: Not At Risk (03/23/2022)  Tobacco Use: Low Risk  (03/23/2022)    Readmission Risk Interventions     No data to display

## 2022-04-01 NOTE — Progress Notes (Signed)
PROGRESS NOTE    Sarah Wu  [id number] DOB: January 24, 1951 DOA: 03/21/2022 PCP: Levin Erp, DO   Brief Narrative:   72 y.o. female with medical history significant of prediabetes, osteoarthritisStatus post right total knee replacement, fibroid tumor, hyperlipidemia, hypertension, postmenopausal bleeding, peripheral neuropathy, stage IV uterine cancer with pancytopenia who was transferred to this facility from [facility] where she has been treated for the past 2 days due to C. difficile colitis, electrolyte abnormalities, sinus tachycardia with QT prolongation that has now converted into atrial fibrillation with RVR . Cardiology consulted and patient started on IV amiodarone and IV heparin. She was transitioned to oral amiodarone and eliquis. X-ray and CT of the right knee shows possible loosening of previous prosthetic knee, orthopedic consulted.  Orthopedic recommending eventually will need surgical intervention once medically stable.  Currently awaiting electrolytes to stabilize and her diarrhea to improve.  In the meantime TOC working to help her with home DME equipment.   Assessment & Plan:  Principal Problem:   C. difficile colitis Active Problems:   Essential hypertension   Prediabetes   Pure hypercholesterolemia   Peripheral neuropathy due to chemotherapy (HCC)   Pressure injury of skin   Atrial fibrillation with RVR (HCC)   Malnutrition of moderate degree    Pancolitis secondary to C. difficile infection Ileus -Currently on Dificid, started on 2/10.  Completed 10 days.  Symptomatic management, IV fluids as necessary.  Diet as tolerated.  Some of her diarrhea had predated this infection related to her chemo and radiation.  Will give her some Imodium to help with her electrolytes due to persistent GI losses.  Abdominal x-ray shows ileus/colonic dilation. -Discussed with Dr. Johnnye Sima, no need for further anti-infectives for C. difficile at this time.   Appreciate his input - Discussed with Dr. Alessandra Bevels from [facility], recommending giving cholestyramine.  Hopefully this will improve her symptoms, otherwise their service will be available for formal consultation.  Appreciate his input.   Atrial fibrillation with RVR, Improved -Seen by [facility] cardiology.  Currently on amiodarone, p.o. metoprolol and anticoagulation. -TSH within normal limits. F/u with PCP   Prolonged QTc  Optimize electrolytes   Hypokalemia/hypokalemia, secondary to GI losses - Replete aggressively, electrolytes slowly improving  Abnormal right knee x-ray consistent with loosening hardware of previous prosthetic. - X-ray and CT scan of right knee done.  [facility]en by [facility], eventually will require surgical revision once medically stable.  Plans will be for outpatient intervention.  In the meantime advised touchdown weightbearing with knee immobilizer and walker with 1-2 person support at all times.   Mild thrombocytopenia Acquired pancytopenia -Continue to monitor.  No obvious evidence of gross bleeding.  Transfuse as necessary. Status post PRBC transfusion 2/13 Hemoccult positive-expected in setting of colitis   Stage 4 Uterine cancer S/p robotic assisted laparoscopic total hysterectomy with bilateral salpingo-oophorectomy. S/p reduced dose of chemotherapy completed 6 cycles.  Follows up with Dr. Alvy Bimler. Peripheral neuropathy from chemotherapy  Current PT recommendations-SNF but plans to go home with home health  Due to patient's knee issues, she would benefit from hospital bed at home, wheelchair, walker and a bedside commode   DVT prophylaxis: Eliquis Code Status: Full code Family Communication: Daughters are present at bedside  Status is: Inpatient Persistent electrolyte abnormality, continue hospital stay   Nutritional status    Signs/Symptoms: mild fat depletion, mild muscle depletion  Interventions: Refer to RD note for recommendations, Magic  cup, MVI  Body mass index is 30.17 kg/m.  Pressure  Injury 03/21/22 Sacrum Mid Stage 3 -  Full thickness tissue loss. Subcutaneous fat may be visible but bone, tendon or muscle are NOT exposed. Red (Active)  03/21/22 0800  Location: Sacrum  Location Orientation: Mid  Staging: Stage 3 -  Full thickness tissue loss. Subcutaneous fat may be visible but bone, tendon or muscle are NOT exposed.  Wound Description (Comments): Red  Present on Admission: Yes     Subjective: Patient does not have any complaints, overall feels weak.  Does tell me that she has had less watery output over last 24 hours.  Oral intake poor but denies any nausea or vomiting.  Tells me her abdomen feels soft and nontender  Examination: Constitutional: Not in acute distress Respiratory: Clear to auscultation bilaterally Cardiovascular: Normal sinus rhythm, no rubs Abdomen: Nontender nondistended good bowel sounds Musculoskeletal: No edema noted Skin: No rashes seen Neurologic: CN 2-12 grossly intact.  And nonfocal Psychiatric: Normal judgment and insight. Alert and oriented x 3. Normal mood.  Right chest wall port in place External catheter Rectal tube  Objective: Vitals:   03/30/22 2010 03/31/22 1445 03/31/22 1847 03/31/22 2116  BP: 125/78 107/63  118/70  Pulse: 84 85  92  Resp: 18 17  17  $ Temp: 98.5 F (36.9 C) 99.2 F (37.3 C) 99.1 F (37.3 C) 98.4 F (36.9 C)  TempSrc: Oral Oral Oral Oral  SpO2: 98% 97%  99%  Weight:      Height:        Intake/Output Summary (Last 24 hours) at 04/01/2022 0806 Last data filed at 04/01/2022 0300 Gross per 24 hour  Intake 808 ml  Output 1390 ml  Net -582 ml   Filed Weights   03/23/22 2016 03/26/22 1113  Weight: 82.2 kg 90 kg     Data Reviewed:   CBC: Recent Labs  Lab 03/28/22 0500 03/29/22 0255 03/30/22 0424 03/31/22 0316 04/01/22 0323  WBC 3.9* 3.5* 3.8* 4.6 4.2  HGB 8.7* 8.5* 8.6* 9.0* 8.7*  HCT 27.9* 27.4* 27.7* 28.4* 27.6*  MCV 91.2 93.2  93.0 93.4 93.6  PLT 124* 138* 158 183 0000000   Basic Metabolic Panel: Recent Labs  Lab 03/28/22 0500 03/29/22 0255 03/30/22 0424 03/30/22 1818 03/31/22 0316 04/01/22 0323  NA 135 134* 134*  --  135 133*  K 3.2* 2.9* 2.9* 3.3* 3.6 3.3*  CL 98 99 101  --  102 99  CO2 27 27 26  $ --  26 27  GLUCOSE 91 93 86  --  93 82  BUN 7* 6* 5*  --  5* <5*  CREATININE 0.58 0.56 0.52  --  0.48 0.52  CALCIUM 7.9* 7.7* 7.6*  --  7.9* 7.8*  MG 1.7 1.3* 1.6* 1.8 1.9 1.3*  PHOS 3.5 3.6 4.2  --  4.0 4.2   GFR: Estimated Creatinine Clearance: 73.5 mL/min (by C-G formula based on SCr of 0.52 mg/dL). Liver Function Tests: No results for input(s): "AST", "ALT", "ALKPHOS", "BILITOT", "PROT", "ALBUMIN" in the last 168 hours.  No results for input(s): "LIPASE", "AMYLASE" in the last 168 hours. No results for input(s): "AMMONIA" in the last 168 hours. Coagulation Profile: No results for input(s): "INR", "PROTIME" in the last 168 hours. Cardiac Enzymes: No results for input(s): "CKTOTAL", "CKMB", "CKMBINDEX", "TROPONINI" in the last 168 hours. BNP (last 3 results) No results for input(s): "PROBNP" in the last 8760 hours. HbA1C: No results for input(s): "HGBA1C" in the last 72 hours. CBG: Recent Labs  Lab 03/25/22 1156 03/25/22 1743 03/25/22 2327  03/26/22 0722 03/26/22 1117  GLUCAP 99 87 89 76 90   Lipid Profile: No results for input(s): "CHOL", "HDL", "LDLCALC", "TRIG", "CHOLHDL", "LDLDIRECT" in the last 72 hours. Thyroid Function Tests: No results for input(s): "TSH", "T4TOTAL", "FREET4", "T3FREE", "THYROIDAB" in the last 72 hours. Anemia Panel: No results for input(s): "VITAMINB12", "FOLATE", "FERRITIN", "TIBC", "IRON", "RETICCTPCT" in the last 72 hours.  Sepsis Labs: No results for input(s): "PROCALCITON", "LATICACIDVEN" in the last 168 hours.  Recent Results (from the past 240 hour(s))  Culture, blood (Routine X 2) w Reflex to ID Panel     Status: None   Collection Time: 03/24/22 12:44 PM    Specimen: BLOOD  Result Value Ref Range Status   Specimen Description   Final    BLOOD BLOOD LEFT ARM Performed at [facility] [address] [place], [place] [address]    Special Requests   Final    BOTTLES DRAWN AEROBIC ONLY Blood Culture adequate volume Performed at [facility] [address] [place], [place] [address]    Culture   Final    NO GROWTH 5 DAYS Performed at [facility], [place] [address] [place], [place] [address]    Report Status 03/29/2022 FINAL  Final  Culture, blood (Routine X 2) w Reflex to ID Panel     Status: None   Collection Time: 03/24/22 12:44 PM   Specimen: BLOOD  Result Value Ref Range Status   Specimen Description   Final    BLOOD BLOOD LEFT HAND Performed at [facility] [address] [place], [place] [address]    Special Requests   Final    BOTTLES DRAWN AEROBIC ONLY Blood Culture adequate volume Performed at [facility] [address], [place], [place] [address]    Culture   Final    NO GROWTH 5 DAYS Performed at [facility], [place] [address] [place], [place] [address]    Report Status 03/29/2022 FINAL  Final         Radiology Studies: No results found.      Scheduled Meds:  amiodarone  200 mg Oral BID   Followed by   Derrill Memo ON 04/06/2022] amiodarone  200 mg Oral Daily   apixaban  5 mg Oral BID   ascorbic acid  500 mg Oral Daily   atorvastatin  10 mg Oral QHS   Chlorhexidine Gluconate Cloth  6 each Topical Q0600   famotidine  20 mg Oral Daily   ferrous sulfate  325 mg Oral Q breakfast   metoprolol tartrate  12.5 mg Oral BID   multivitamin with minerals  1 tablet Oral Daily   potassium chloride  40 mEq Oral Once   sodium chloride flush  10-40 mL Intracatheter Q12H   Continuous Infusions:  magnesium sulfate bolus IVPB     potassium chloride       LOS: 10 days   Time spent= 35 mins    Hicks Feick Arsenio Loader, MD [facility]  If 7PM-7AM, please contact night-coverage  04/01/2022, 8:06 AM

## 2022-04-01 NOTE — Progress Notes (Signed)
Mobility Specialist - Progress Note   04/01/22 1522  Mobility  Activity  (Supine UE Exercises)  Level of Assistance Modified independent, requires aide device or extra time  Range of Motion/Exercises Passive;Right arm;Left arm;Active  Activity Response Tolerated well  $Mobility charge 1 Mobility   Pt received in bed and agreeable to  Supine UE exercises. No complaints during session. Pt required passive assistance for extension & flexion due to hand/band positioning. Pt to bed after session w/ all needs met.    Supine BUE Exercises: 6-7  reps each,  yellow resistance band  1) Elbow Flexion (Passive)  2) Elbow extension (Passive)  3) Horizontal Abduction (Active)   [facility]

## 2022-04-02 ENCOUNTER — Other Ambulatory Visit: Payer: Medicare Other

## 2022-04-02 ENCOUNTER — Other Ambulatory Visit: Payer: Self-pay

## 2022-04-02 ENCOUNTER — Ambulatory Visit: Payer: Medicare Other

## 2022-04-02 ENCOUNTER — Ambulatory Visit: Payer: Medicare Other | Admitting: Hematology and Oncology

## 2022-04-02 DIAGNOSIS — A0472 Enterocolitis due to Clostridium difficile, not specified as recurrent: Secondary | ICD-10-CM | POA: Diagnosis not present

## 2022-04-02 LAB — BASIC METABOLIC PANEL
Anion gap: 15 (ref 5–15)
BUN: <5 mg/dL — ABNORMAL LOW (ref 8–23)
CO2: 25 mmol/L (ref 22–32)
Calcium: 8.3 mg/dL — ABNORMAL LOW (ref 8.9–10.3)
Chloride: 98 mmol/L (ref 98–111)
Creatinine, Ser: 0.6 mg/dL (ref 0.44–1.00)
GFR, Estimated: >60 mL/min (ref >60)
Glucose, Bld: 80 mg/dL (ref 70–99)
Potassium: 3.8 mmol/L (ref 3.5–5.1)
Sodium: 138 mmol/L (ref 135–145)

## 2022-04-02 LAB — MAGNESIUM: Magnesium: 1.6 mg/dL — ABNORMAL LOW (ref 1.7–2.4)

## 2022-04-02 LAB — PHOSPHORUS: Phosphorus: 4.6 mg/dL (ref 2.5–4.6)

## 2022-04-02 MED ORDER — POTASSIUM CHLORIDE 10 MEQ/100ML IV SOLN
10.0000 meq | INTRAVENOUS | Status: AC
  Administered 2022-04-02 (×3): 10 meq via INTRAVENOUS
  Filled 2022-04-02 (×3): qty 100

## 2022-04-02 MED ORDER — MAGNESIUM SULFATE 4 GM/100ML IV SOLN
4.0000 g | Freq: Once | INTRAVENOUS | Status: AC
  Administered 2022-04-02: 4 g via INTRAVENOUS
  Filled 2022-04-02: qty 100

## 2022-04-02 NOTE — TOC Progression Note (Addendum)
Transition of Care [facility]) - Progression Note    Patient Details  Name: Sarah Wu MRN: [id number] Date of Birth: [birth year]  Transition of Care [facility]) CM/SW [place], [place] Phone Number: 04/02/2022, 3:57 PM  Clinical Narrative:      CSW contacted the following [facility] agencies to arrange [place]:  [facility]- they do not service pt's home area [facility]- awaiting response  [facility]- awaiting response    Expected Discharge Plan and Services                                               Social Determinants of Health (SDOH) Interventions SDOH Screenings   Food Insecurity: No Food Insecurity (03/23/2022)  Housing: Low Risk  (03/23/2022)  Transportation Needs: No Transportation Needs (03/23/2022)  Utilities: Not At Risk (03/23/2022)  Tobacco Use: Low Risk  (03/23/2022)    Readmission Risk Interventions     No data to display

## 2022-04-02 NOTE — Telephone Encounter (Signed)
Still admitted to hospital

## 2022-04-02 NOTE — Plan of Care (Signed)

## 2022-04-02 NOTE — Care Management Important Message (Signed)
Important Message  Patient Details IM Letter given. Name: Sarah Wu MRN: [id number] Date of Birth: 1951/01/11   Medicare Important Message Given:  Yes     Kerin Salen 04/02/2022, 12:55 PM

## 2022-04-02 NOTE — Progress Notes (Signed)
PROGRESS NOTE    Sarah Wu  [id number] DOB: 1950-12-28 DOA: 03/21/2022 PCP: Levin Erp, DO   Brief Narrative:   72 y.o. female with medical history significant of prediabetes, osteoarthritisStatus post right total knee replacement, fibroid tumor, hyperlipidemia, hypertension, postmenopausal bleeding, peripheral neuropathy, stage IV uterine cancer with pancytopenia who was transferred to this facility from [facility] where she has been treated for the past 2 days due to C. difficile colitis, electrolyte abnormalities, sinus tachycardia with QT prolongation that has now converted into atrial fibrillation with RVR . Cardiology consulted and patient started on IV amiodarone and IV heparin. She was transitioned to oral amiodarone and eliquis. X-ray and CT of the right knee shows possible loosening of previous prosthetic knee, orthopedic consulted.  Orthopedic recommending eventually will need surgical intervention once medically stable.  Currently awaiting electrolytes to stabilize and her diarrhea to improve.  In the meantime TOC working to help her with home DME equipment.  [facility] s[facility], to cholestyramine added   Assessment & Plan:  Principal Problem:   C. difficile colitis Active Problems:   Essential hypertension   Prediabetes   Pure hypercholesterolemia   Peripheral neuropathy due to chemotherapy ([place])   Pressure injury of skin   Atrial fibrillation with RVR (HCC)   Malnutrition of moderate degree    Pancolitis secondary to C. difficile infection Ileus -Currently on Dificid, started on 2/10.  Completed 10 days.  Symptomatic management, IV fluids as necessary.  Diet as tolerated.  Some of her diarrhea had predated this infection related to her chemo and radiation.  Will give her some Imodium to help with her electrolytes due to persistent GI losses.  Abdominal x-ray shows ileus/colonic dilation. -Discussed with Dr. Johnnye Sima, no need for  further anti-infectives for C. difficile at this time.  Appreciate his input - Curb sided Dr. Alessandra Bevels from [facility] who recommended cholestyramine for about 3-4 weeks.  Timing adjusted as patient is on amiodarone. -Repeat abdominal x-ray tomorrow morning   Atrial fibrillation with RVR, Improved -Seen by [facility] cardiology.  Currently on amiodarone, p.o. metoprolol and anticoagulation. -TSH within normal limits. F/u with PCP   Prolonged QTc  Optimize electrolytes   Hypokalemia/hypokalemia, secondary to GI losses - Much better today.  Will replete more to get potassium above 4 to help with ileus  Abnormal right knee x-ray consistent with loosening hardware of previous prosthetic. - X-ray and CT scan of right knee done.  [facility]en by [facility], eventually will require surgical revision once medically stable.  Plans will be for outpatient intervention.  In the meantime advised touchdown weightbearing with knee immobilizer and walker with 1-2 person support at all times.   Mild thrombocytopenia Acquired pancytopenia -Continue to monitor.  No obvious evidence of gross bleeding.  Transfuse as necessary. Status post PRBC transfusion 2/13 Hemoccult positive-expected in setting of colitis   Stage 4 Uterine cancer S/p robotic assisted laparoscopic total hysterectomy with bilateral salpingo-oophorectomy. S/p reduced dose of chemotherapy completed 6 cycles.  Follows up with Dr. Alvy Bimler. Peripheral neuropathy from chemotherapy  Current PT recommendations-SNF but plans to go home with home health  Due to patient's knee issues, she would benefit from hospital bed at home, wheelchair, walker and a bedside commode   DVT prophylaxis: Eliquis Code Status: Full code Family Communication: Daughters are present at bedside  Status is: Inpatient Persistent electrolyte abnormality, continue hospital stay   Nutritional status    Signs/Symptoms: mild fat depletion, mild muscle  depletion  Interventions: Refer to  RD note for recommendations, Magic cup, MVI  Body mass index is 30.17 kg/m.  Pressure Injury 03/21/22 Sacrum Mid Stage 3 -  Full thickness tissue loss. Subcutaneous fat may be visible but bone, tendon or muscle are NOT exposed. Red (Active)  03/21/22 0800  Location: Sacrum  Location Orientation: Mid  Staging: Stage 3 -  Full thickness tissue loss. Subcutaneous fat may be visible but bone, tendon or muscle are NOT exposed.  Wound Description (Comments): Red  Present on Admission: Yes     Subjective: Still having loose stools but improved.  Denies any abdominal pain.  Examination: Constitutional: Not in acute distress Respiratory: Clear to auscultation bilaterally Cardiovascular: Normal sinus rhythm, no rubs Abdomen: Nontender nondistended good bowel sounds Musculoskeletal: No edema noted Skin: No rashes seen Neurologic: CN 2-12 grossly intact.  And nonfocal Psychiatric: Normal judgment and insight. Alert and oriented x 3. Normal mood.  Right chest wall port in place External catheter Rectal tube  Objective: Vitals:   04/01/22 1200 04/01/22 2054 04/02/22 0455 04/02/22 1035  BP:  126/83 125/76 115/77  Pulse:  93 90 90  Resp: 16     Temp:  99 F (37.2 C) 98.5 F (36.9 C)   TempSrc:  Oral Oral   SpO2:  99% 100%   Weight:      Height:        Intake/Output Summary (Last 24 hours) at 04/02/2022 1204 Last data filed at 04/02/2022 [id number] Gross per 24 hour  Intake 900.53 ml  Output 2750 ml  Net -1849.47 ml   Filed Weights   03/23/22 2016 03/26/22 1113  Weight: 82.2 kg 90 kg     Data Reviewed:   CBC: Recent Labs  Lab 03/28/22 0500 03/29/22 0255 03/30/22 0424 03/31/22 0316 04/01/22 0323  WBC 3.9* 3.5* 3.8* 4.6 4.2  HGB 8.7* 8.5* 8.6* 9.0* 8.7*  HCT 27.9* 27.4* 27.7* 28.4* 27.6*  MCV 91.2 93.2 93.0 93.4 93.6  PLT 124* 138* 158 183 0000000   Basic Metabolic Panel: Recent Labs  Lab 03/29/22 0255 03/30/22 0424 03/30/22 1818  03/31/22 0316 04/01/22 0323 04/02/22 0356 04/02/22 1058  NA 134* 134*  --  135 133*  --  138  K 2.9* 2.9* 3.3* 3.6 3.3*  --  3.8  CL 99 101  --  102 99  --  98  CO2 27 26  --  26 27  --  25  GLUCOSE 93 86  --  93 82  --  80  BUN 6* 5*  --  5* <5*  --  <5*  CREATININE 0.56 0.52  --  0.48 0.52  --  0.60  CALCIUM 7.7* 7.6*  --  7.9* 7.8*  --  8.3*  MG 1.3* 1.6* 1.8 1.9 1.3* 1.6*  --   PHOS 3.6 4.2  --  4.0 4.2 4.6  --    GFR: Estimated Creatinine Clearance: 73.5 mL/min (by C-G formula based on SCr of 0.6 mg/dL). Liver Function Tests: No results for input(s): "AST", "ALT", "ALKPHOS", "BILITOT", "PROT", "ALBUMIN" in the last 168 hours.  No results for input(s): "LIPASE", "AMYLASE" in the last 168 hours. No results for input(s): "AMMONIA" in the last 168 hours. Coagulation Profile: No results for input(s): "INR", "PROTIME" in the last 168 hours. Cardiac Enzymes: No results for input(s): "CKTOTAL", "CKMB", "CKMBINDEX", "TROPONINI" in the last 168 hours. BNP (last 3 results) No results for input(s): "PROBNP" in the last 8760 hours. HbA1C: No results for input(s): "HGBA1C" in the last 72 hours. CBG:  No results for input(s): "GLUCAP" in the last 168 hours.  Lipid Profile: No results for input(s): "CHOL", "HDL", "LDLCALC", "TRIG", "CHOLHDL", "LDLDIRECT" in the last 72 hours. Thyroid Function Tests: No results for input(s): "TSH", "T4TOTAL", "FREET4", "T3FREE", "THYROIDAB" in the last 72 hours. Anemia Panel: No results for input(s): "VITAMINB12", "FOLATE", "FERRITIN", "TIBC", "IRON", "RETICCTPCT" in the last 72 hours.  Sepsis Labs: No results for input(s): "PROCALCITON", "LATICACIDVEN" in the last 168 hours.  Recent Results (from the past 240 hour(s))  Culture, blood (Routine X 2) w Reflex to ID Panel     Status: None   Collection Time: 03/24/22 12:44 PM   Specimen: BLOOD  Result Value Ref Range Status   Specimen Description   Final    BLOOD BLOOD LEFT ARM Performed at [facility] [address], [place], [place] [address]    Special Requests   Final    BOTTLES DRAWN AEROBIC ONLY Blood Culture adequate volume Performed at [facility] [address], [place], [place] [address]    Culture   Final    NO GROWTH 5 DAYS Performed at [facility], [place] [address] [place], [place] [address]    Report Status 03/29/2022 FINAL  Final  Culture, blood (Routine X 2) w Reflex to ID Panel     Status: None   Collection Time: 03/24/22 12:44 PM   Specimen: BLOOD  Result Value Ref Range Status   Specimen Description   Final    BLOOD BLOOD LEFT HAND Performed at [facility] [address] [place], [place] [address]    Special Requests   Final    BOTTLES DRAWN AEROBIC ONLY Blood Culture adequate volume Performed at [facility] [address] [place], [place] [address]    Culture   Final    NO GROWTH 5 DAYS Performed at [facility], [place] [address], [place],  [address]    Report Status 03/29/2022 FINAL  Final         Radiology Studies: DG Abd 1 View  Result Date: 04/01/2022 CLINICAL DATA:  [id number] Abdominal distention [id number] EXAM: ABDOMEN - 1 VIEW COMPARISON:  03/22/2022 FINDINGS: Persistent marked gaseous distension of the colon as well as several prominent loops of small bowel, pattern very similar to the previous study. No gross free intraperitoneal air. Streaky bibasilar opacities. IMPRESSION: Persistent marked gaseous distension of the colon as well as several prominent loops of small bowel, pattern very similar to the previous study and favors ileus. Electronically Signed   By: Davina Poke D.O.   On: 04/01/2022 08:37        Scheduled Meds:  amiodarone  200 mg Oral BID   Followed by   Derrill Memo ON 04/06/2022] amiodarone  200 mg Oral Daily   apixaban  5 mg Oral BID   ascorbic acid  500 mg Oral Daily   atorvastatin  10 mg Oral QHS   Chlorhexidine Gluconate  Cloth  6 each Topical Q0600   cholestyramine light  4 g Oral 4 times per day   famotidine  20 mg Oral Daily   ferrous sulfate  325 mg Oral Q breakfast   metoprolol tartrate  12.5 mg Oral BID   multivitamin with minerals  1 tablet Oral Daily   sodium chloride flush  10-40 mL Intracatheter Q12H   Continuous Infusions:  potassium chloride       LOS: 11 days   Time spent= 35 mins  Avyukt Cimo Arsenio Loader, MD [facility]  If 7PM-7AM, please contact night-coverage  04/02/2022, 12:04 PM

## 2022-04-02 NOTE — Progress Notes (Signed)
PT Cancellation Note  Patient Details Name: Sarah Wu MRN: [id number] DOB: [birth year]   Cancelled Treatment:    Reason Eval/Treat Not Completed: Other (comment) family request that PT come back later as they just turned her on her side, has been having a lot of pain in buttocks region- will attempt to return later if time/schedule allow.   Deniece Ree PT DPT PN2

## 2022-04-03 ENCOUNTER — Inpatient Hospital Stay (HOSPITAL_COMMUNITY): Payer: Medicare Other

## 2022-04-03 ENCOUNTER — Ambulatory Visit: Payer: Medicare Other | Admitting: Internal Medicine

## 2022-04-03 DIAGNOSIS — A0472 Enterocolitis due to Clostridium difficile, not specified as recurrent: Secondary | ICD-10-CM | POA: Diagnosis not present

## 2022-04-03 LAB — BASIC METABOLIC PANEL
Anion gap: 12 (ref 5–15)
BUN: 6 mg/dL — ABNORMAL LOW (ref 8–23)
CO2: 25 mmol/L (ref 22–32)
Calcium: 8.3 mg/dL — ABNORMAL LOW (ref 8.9–10.3)
Chloride: 99 mmol/L (ref 98–111)
Creatinine, Ser: 0.63 mg/dL (ref 0.44–1.00)
GFR, Estimated: >60 mL/min (ref >60)
Glucose, Bld: 74 mg/dL (ref 70–99)
Potassium: 3.9 mmol/L (ref 3.5–5.1)
Sodium: 136 mmol/L (ref 135–145)

## 2022-04-03 LAB — PHOSPHORUS: Phosphorus: 4.3 mg/dL (ref 2.5–4.6)

## 2022-04-03 LAB — MAGNESIUM: Magnesium: 1.6 mg/dL — ABNORMAL LOW (ref 1.7–2.4)

## 2022-04-03 MED ORDER — MAGNESIUM SULFATE 2 GM/50ML IV SOLN
2.0000 g | Freq: Once | INTRAVENOUS | Status: AC
  Administered 2022-04-03: 2 g via INTRAVENOUS
  Filled 2022-04-03: qty 50

## 2022-04-03 MED ORDER — SODIUM CHLORIDE (PF) 0.9 % IJ SOLN
INTRAMUSCULAR | Status: AC
  Filled 2022-04-03: qty 50

## 2022-04-03 MED ORDER — IOHEXOL 300 MG/ML  SOLN
100.0000 mL | Freq: Once | INTRAMUSCULAR | Status: AC | PRN
  Administered 2022-04-03: 100 mL via INTRAVENOUS

## 2022-04-03 MED ORDER — IOHEXOL 9 MG/ML PO SOLN
500.0000 mL | ORAL | Status: AC
  Administered 2022-04-03 (×2): 500 mL via ORAL

## 2022-04-03 MED ORDER — IOHEXOL 9 MG/ML PO SOLN
ORAL | Status: AC
  Filled 2022-04-03: qty 1000

## 2022-04-03 NOTE — Telephone Encounter (Signed)
Still admitted to hospital

## 2022-04-03 NOTE — TOC Progression Note (Signed)
Transition of Care [facility]) - Progression Note    Patient Details  Name: Sarah Wu MRN: [id number] Date of Birth: [birth year]  Transition of [facility]) CM/SW [place], LCSW Phone Number: 04/03/2022, 1:41 PM  Clinical Narrative:     CSW spoke with Amy with [facility] , she reported they do not cover [facility].  [facility])- declined. [facility]- declined  [facility]- Referral sent pending response. TOC to follow.           Expected Discharge Plan and Services                                               Social Determinants of Health (SDOH) Interventions SDOH Screenings   Food Insecurity: No Food Insecurity (03/23/2022)  Housing: Low Risk  (03/23/2022)  Transportation Needs: No Transportation Needs (03/23/2022)  Utilities: Not At Risk (03/23/2022)  Tobacco Use: Low Risk  (03/23/2022)    Readmission Risk Interventions     No data to display

## 2022-04-03 NOTE — Telephone Encounter (Signed)
Called patient, Sarah Wu, Sarah Wu

## 2022-04-03 NOTE — Plan of Care (Signed)

## 2022-04-03 NOTE — Progress Notes (Signed)
PT Cancellation Note  Patient Details Name: Sarah Wu MRN: [id number] DOB: 1950/10/19   Cancelled Treatment:     Pt drinking contrast for her ABD imaging then was working with Occupational Therapy.  Will attempt to see pt another day/time as schedule permits.  Rica Koyanagi  PTA Acute  Rehabilitation Services Office M-F          [phone] Weekend pager [phone]

## 2022-04-03 NOTE — Progress Notes (Signed)
Occupational Therapy Treatment Patient Details Name: Sarah Wu MRN: [id number] DOB: Oct 18, 1950 Today's Date: 04/03/2022   History of present illness Patient is 72 y.o. female who was transferred [facility] from [facility] where she was treated for 2 days due to C. difficile colitis, electrolyte abnormalities, sinus tachycardia with QT prolongation that has now converted into atrial fibrillation with RVR. PMH significant for prediabetes, osteoarthritis, fibroid tumor, HLD, HTN, postmenopausal bleeding, peripheral neuropathy, stage IV uterine cancer with pancytopenia.   OT comments  Patient showed some mild progress with improved willingness and ability to release one hand from bed while sitting EOB and use RT hand to wipe face, compared to previous session when pt was total assist with all ADLs in sitting and unwilling to try using UEs functionally while balancing in seated position.  Pt does require frequent encouragement and reassurance due to fear of falling, discomfort from flexiseal, and dizziness with mobility.   Patient remains limited by cognitive deficits, dizziness, profound generalized weakness and decreased activity tolerance along with deficits noted below. Pt continues to demonstrate fair rehab potential and would benefit from continued skilled OT to increase safety and independence with ADLs and functional transfers to allow pt to return home safely and reduce caregiver burden and fall risk.    Recommendations for follow up therapy are one component of a multi-disciplinary discharge planning process, led by the attending physician.  Recommendations may be updated based on patient status, additional functional criteria and insurance authorization.    Follow Up Recommendations  Skilled nursing-short term rehab (<3 hours/day)     Assistance Recommended at Discharge Frequent or constant Supervision/Assistance  Patient can return home with the following  Two people to  help with walking and/or transfers;A lot of help with bathing/dressing/bathroom;Assistance with cooking/housework;Assist for transportation;Help with stairs or ramp for entrance   Equipment Recommendations  Other (comment)    Recommendations for Other Services      Precautions / Restrictions Precautions Precautions: Fall;Other (comment) Precaution Comments: R LE TDWB with KI Required Braces or Orthoses: Knee Immobilizer - Right Knee Immobilizer - Right: On when out of bed or walking Restrictions Weight Bearing Restrictions: Yes RLE Weight Bearing: Touchdown weight bearing       Mobility Bed Mobility Overal bed mobility: Needs Assistance Bed Mobility: Supine to Sit, Sit to Supine, Rolling Rolling: Max assist, +2 for physical assistance   Supine to sit: Max assist, +2 for physical assistance Sit to supine: Max assist, +2 for physical assistance        Transfers                         Balance Overall balance assessment: Needs assistance Sitting-balance support: Feet unsupported, Single extremity supported Sitting balance-Leahy Scale: Fair Sitting balance - Comments: prefers to rely on BUEs for balance                                   ADL either performed or assessed with clinical judgement   ADL Overall ADL's : Needs assistance/impaired     Grooming: Min guard;Set up;Sitting;Cueing for sequencing;Cueing for safety Grooming Details (indicate cue type and reason): With Max encouragement pt able to release RT hand from bed while supporting self with LT hand and wipe face. Pt expressed fear of falling continuously and requires frquent reassurance for safety in order to work on more challenging sitting balance activities such as using a  UEs. Min guard for trunk support to allow pt to feel safe. Pt reporting dizziness through 20 min of EOB sitting. Highest HR: 111 per tele. Upper Body Bathing: Maximal assistance;Sitting Upper Body Bathing Details  (indicate cue type and reason): Daughters performing while pt EOB. Pt not intiaiting to participate.         Lower Body Dressing: Total assistance;Bed level Lower Body Dressing Details (indicate cue type and reason): To don socks. Pt unable to kick either foot off bed to assist. Toilet Transfer: Total assistance Toilet Transfer Details (indicate cue type and reason): Unable to stand. Pt re-educated on TDWB to RLE.  MD to room and pt verbalized agreement to remove flexiseal. Daughters asking that once flexiseal removed, pt can begin [place] transfers to recliner.         Functional mobility during ADLs: Maximal assistance;+2 for physical assistance      Extremity/Trunk Assessment Upper Extremity Assessment Upper Extremity Assessment: RUE deficits/detail;LUE deficits/detail;Generalized weakness RUE Deficits / Details: Very limited shoulder ROM with apparent OA LUE Deficits / Details: Very limited shoulder ROM with apparent OA            Vision       Perception     Praxis      Cognition Arousal/Alertness: Awake/alert Behavior During Therapy: Flat affect, Anxious Overall Cognitive Status: Within Functional Limits for tasks assessed                                 General Comments: needed lots of encouragement for all activities, daughters present and helped to convince her to participate; repeatedly asked about transferring to the recliner and educated that this is not safe as she currently cannot maintain TDWB and will need mechanical lift.        Exercises      Shoulder Instructions       General Comments      Pertinent Vitals/ Pain       Pain Assessment Pain Assessment: Faces Faces Pain Scale: Hurts little more Pain Location: site of rectal tube Pain Descriptors / Indicators: Aching, Discomfort Pain Intervention(s): Limited activity within patient's tolerance, Monitored during session, Repositioned  Home Living                                           Prior Functioning/Environment              Frequency  Min 2X/week        Progress Toward Goals  OT Goals(current goals can now be found in the care plan section)  Progress towards OT goals: Progressing toward goals  Acute Rehab OT Goals Patient Stated Goal: Per daughters: For pt to get to chair. OT Goal Formulation: With family Time For Goal Achievement: 04/09/22 Potential to Achieve Goals: [place] Discharge plan remains appropriate    Co-evaluation                 AM-PAC OT "6 Clicks" Daily Activity     Outcome Measure   Help from another person eating meals?: A Little Help from another person taking care of personal grooming?: A Little Help from another person toileting, which includes using toliet, bedpan, or urinal?: Total Help from another person bathing (including washing, rinsing, drying)?: A Lot Help from another person to put on and taking off  regular upper body clothing?: A Lot Help from another person to put on and taking off regular lower body clothing?: Total 6 Click Score: 12    End of Session    OT Visit Diagnosis: Muscle weakness (generalized) (M62.81)   Activity Tolerance Patient limited by pain;Other (comment) (Limited by fear of falling)   Patient Left in bed;with call bell/phone within reach;with bed alarm set;with family/visitor present   Nurse Communication Other (comment) (MD to room and discussed future use of mechanic lift.)        Time: [phone] OT Time Calculation (min): 38 min  Charges: OT General Charges $OT Visit: 1 Visit OT Treatments $Self Care/Home Management : 8-22 mins $Therapeutic Activity: 23-37 mins  Anderson [place], OT Acute Rehab Services Office: [phone] 04/03/2022  Julien Girt 04/03/2022, 11:28 AM

## 2022-04-03 NOTE — Consult Note (Signed)
Referring Provider: [place] Primary Care Physician:  Levin Erp, DO Primary Gastroenterologist:  Althia Forts  Reason for Consultation:  Ileus  HPI: Sarah Wu is a 72 y.o. female  medical history significant of prediabetes, osteoarthritisStatus post right total knee replacement, fibroid tumor, hyperlipidemia, hypertension, postmenopausal bleeding, peripheral neuropathy, stage IV uterine cancer with pancytopenia who was transferred to this facility from [facility] where she has been treated for the past 10 days due to C. difficile colitis, electrolyte abnormalities, sinus tachycardia with QT prolongation that has now converted into atrial fibrillation with RVR .   Patient's diarrhea initially improved after treatment of 10 days of Dificid.  Then continued to have watery diarrhea and abdominal x-ray continues to show worsening ileus.  Trial of cholestyramine with minimal improvement. Patient's daughter states stool in bag is beginning to have a little more form to it. Tolerating food without difficulty, though not eating much due to ill taste of the food. Patient thinks she had a colonoscopy done more that 20+ years ago, unsure what it showed. Since then has been doing yearly stool studies that have all been negative. Denies family history of colon cancer. Patient denies abdominal pain, nausea, and vomiting. Denies melena/hematochezia    Past Medical History:  Diagnosis Date   Arthritis    Cancer ([place])    Fibroid tumor    Hx of mammogram 2022   [place]   Hypercholesteremia    Hypertension    Post-menopausal bleeding    Pre-diabetes     Past Surgical History:  Procedure Laterality Date   EXAMINATION UNDER ANESTHESIA  08/11/2021   with cervical mass biopsies   IR CHEST FLUORO  10/14/2021   IR IMAGING GUIDED PORT INSERTION  09/23/2021   IR IMAGING GUIDED PORT INSERTION  10/17/2021   LAPAROSCOPY N/A 09/10/2021   Procedure: DIAGNOSTIC LAPAROSCOPY;  Surgeon: Lafonda Mosses, MD;  Location: [facility];  Service: Gynecology;  Laterality: N/A;   REPLACEMENT TOTAL KNEE BILATERAL     TUBAL LIGATION      Prior to Admission medications   Medication Sig Start Date End Date Taking? Authorizing Provider  allopurinol (ZYLOPRIM) 300 MG tablet Take 300 mg by mouth daily.   Yes [provider]  amLODipine (NORVASC) 5 MG tablet Take 5 mg by mouth at bedtime.   Yes [provider]  atenolol (TENORMIN) 50 MG tablet Take 50 mg by mouth daily.   Yes [provider]  atorvastatin (LIPITOR) 10 MG tablet Take 10 mg by mouth at bedtime.   Yes [provider]  Biotin (BIOTIN 5000) 5 MG CAPS Take 5 mg by mouth daily.   Yes [provider]  diphenoxylate-atropine (LOMOTIL) 2.5-0.025 MG tablet Take 2 tablets by mouth 4 (four) times daily as needed for diarrhea or loose stools. 03/17/22  Yes Gery Pray, MD  ferrous sulfate 325 (65 FE) MG tablet Take 325 mg by mouth daily with breakfast.   Yes [provider]  Lidocaine 3.5 % PTCH Apply 1 patch topically daily as needed (knee pain).   Yes [provider]  lidocaine-prilocaine (EMLA) cream Apply to affected area once 09/17/21  Yes Gorsuch, Ni, MD  Multiple Vitamins-Minerals (MULTIVITAMIN WITH MINERALS) tablet Take 1 tablet by mouth at bedtime. Centrum   Yes [provider]  ondansetron (ZOFRAN) 8 MG tablet Take 1 tablet by mouth every 8 hours as needed for nausea or vomiting. Start on the third day after chemotherapy. 09/17/21  Yes Heath Lark, MD  OVER THE COUNTER  MEDICATION Take 1 tablet by mouth daily. [facility] and brain support   Yes [provider]  polyethylene glycol (MIRALAX / GLYCOLAX) 17 g packet Take 17 g by mouth daily as needed for mild constipation.   Yes [provider]  prochlorperazine (COMPAZINE) 10 MG tablet Take 1 tablet (10 mg total) by mouth every 6 (six) hours as needed for nausea or vomiting. 03/12/22  Yes Gorsuch, Ni,  MD  furosemide (LASIX) 20 MG tablet Take 1 tablet (20 mg total) by mouth daily. Patient not taking: Reported on 02/26/2022 11/17/21   Heath Lark, MD    Scheduled Meds:  amiodarone  200 mg Oral BID   Followed by   Derrill Memo ON 04/06/2022] amiodarone  200 mg Oral Daily   apixaban  5 mg Oral BID   ascorbic acid  500 mg Oral Daily   atorvastatin  10 mg Oral QHS   Chlorhexidine Gluconate Cloth  6 each Topical Q0600   cholestyramine light  4 g Oral 4 times per day   famotidine  20 mg Oral Daily   ferrous sulfate  325 mg Oral Q breakfast   metoprolol tartrate  12.5 mg Oral BID   multivitamin with minerals  1 tablet Oral Daily   sodium chloride flush  10-40 mL Intracatheter Q12H   Continuous Infusions:  magnesium sulfate bolus IVPB 2 g (04/03/22 0807)   PRN Meds:.acetaminophen **OR** acetaminophen, guaiFENesin, hydrALAZINE, hydrOXYzine, ipratropium-albuterol, liver oil-zinc oxide, metoprolol tartrate, ondansetron (ZOFRAN) IV, mouth rinse, prochlorperazine, simethicone, sodium chloride flush, traMADol, traZODone  Allergies as of 03/20/2022 - Review Complete 03/12/2022  Allergen Reaction Noted   Hydrocodone-acetaminophen Swelling 09/06/2017   Oxycodone-acetaminophen Swelling 07/04/2016    Family History  Problem Relation Age of Onset   Breast cancer Sister        half-sister   Colon cancer Neg Hx    Ovarian cancer Neg Hx    Endometrial cancer Neg Hx    Pancreatic cancer Neg Hx    Prostate cancer Neg Hx     Social History   Socioeconomic History   Marital status: Single    Spouse name: Not on file   Number of children: Not on file   Years of education: Not on file   Highest education level: Not on file  Occupational History   Not on file  Tobacco Use   Smoking status: Never    Passive exposure: Never   Smokeless tobacco: Never  Vaping Use   Vaping Use: Never used  Substance and Sexual Activity   Alcohol use: Never   Drug use: Never   Sexual activity: Not Currently  Other  Topics Concern   Not on file  Social History Narrative   Not on file   Social Determinants of Health   Financial Resource Strain: Not on file  Food Insecurity: No Food Insecurity (03/23/2022)   Hunger Vital Sign    Worried About Running Out of Food in the Last Year: Never true    Ran Out of Food in the Last Year: Never true  Transportation Needs: No Transportation Needs (03/23/2022)   PRAPARE - Hydrologist (Medical): No    Lack of Transportation (Non-Medical): No  Physical Activity: Not on file  Stress: Not on file  Social Connections: Not on file  Intimate Partner Violence: Unknown (03/23/2022)   Humiliation, Afraid, Rape, and Kick questionnaire    Fear of Current or Ex-Partner: Patient refused    Emotionally Abused: Patient refused  Physically Abused: Patient refused    Sexually Abused: Patient refused    Review of Systems: All negative except as stated above in HPI.  Physical Exam:Physical Exam Constitutional:      Appearance: Normal appearance.  HENT:     Head: Normocephalic and atraumatic.     Nose: Nose normal. No congestion.     Mouth/Throat:     Mouth: Mucous membranes are moist.  Eyes:     Extraocular Movements: Extraocular movements intact.     Conjunctiva/sclera: Conjunctivae normal.  Cardiovascular:     Rate and Rhythm: Normal rate and regular rhythm.  Pulmonary:     Effort: Pulmonary effort is normal. No respiratory distress.     Breath sounds: No stridor.  Abdominal:     General: There is distension.     Palpations: There is no mass.     Tenderness: There is no abdominal tenderness. There is no guarding or rebound.     Hernia: No hernia is present.     Comments: Hyperactive BS  Musculoskeletal:        General: No swelling. Normal range of motion.     Cervical back: Normal range of motion and neck supple.  Skin:    General: Skin is warm and dry.  Neurological:     General: No focal deficit present.     Mental Status:  She is alert and oriented to person, place, and time.  Psychiatric:        Mood and Affect: Mood normal.        Behavior: Behavior normal.        Thought Content: Thought content normal.        Judgment: Judgment normal.     Vital signs: Vitals:   04/02/22 1204 04/02/22 2008  BP: 118/83 118/75  Pulse: 86 95  Resp: 16 16  Temp: 98.3 F (36.8 C) 99.1 F (37.3 C)  SpO2: 96% 97%   Last BM Date : 04/01/22    GI:  Lab Results: Recent Labs    04/01/22 0323  WBC 4.2  HGB 8.7*  HCT 27.6*  PLT 195   BMET Recent Labs    04/01/22 0323 04/02/22 1058 04/03/22 0400  NA 133* 138 136  K 3.3* 3.8 3.9  CL 99 98 99  CO2 '27 25 25  '$ GLUCOSE 82 80 74  BUN <5* <5* 6*  CREATININE 0.52 0.60 0.63  CALCIUM 7.8* 8.3* 8.3*   LFT No results for input(s): "PROT", "ALBUMIN", "AST", "ALT", "ALKPHOS", "BILITOT", "BILIDIR", "IBILI" in the last 72 hours. PT/INR No results for input(s): "LABPROT", "INR" in the last 72 hours.   Studies/Results: DG Abd 1 View  Result Date: 04/03/2022 CLINICAL DATA:  Abdominal distention EXAM: ABDOMEN - 1 VIEW COMPARISON:  Abdominal radiograph dated 04/01/2022 FINDINGS: Interval increase in marked dilation of gas-filled large bowel. Rectal tube in-situ. No acute osseous abnormality. IMPRESSION: Interval increase in marked dilation of gas-filled large bowel. Electronically Signed   By: Darrin Nipper M.D.   On: 04/03/2022 08:11    Impression: Ileus -Abdominal x-ray shows interval increase in marked dilation of gas-filled large bowel -Phosphorus 4.3, magnesium 1.6 -Potassium 3.9 -Hgb 8.7   Plan: No improvement with cholestyramine. Discontinue Will obtain CT ab/pelvis with contrast for further evaluation. Continue supportive care Recommend NPO until CT scan and then can have clear liquid diet after. Continue daily CBC and transfuse as needed to maintain HGB > 7  [facility] will follow    LOS: 12 days   Kyson Kupper  Radford Pax  PA-C 04/03/2022, 8:38 AM  Contact  #  [phone]

## 2022-04-03 NOTE — Progress Notes (Addendum)
PROGRESS NOTE    Sarah Wu  [id number] DOB: 06/19/1950 DOA: 03/21/2022 PCP: Levin Erp, DO   Brief Narrative:   72 y.o. female with medical history significant of prediabetes, osteoarthritisStatus post right total knee replacement, fibroid tumor, hyperlipidemia, hypertension, postmenopausal bleeding, peripheral neuropathy, stage IV uterine cancer with pancytopenia who was transferred to this facility from [facility] where she has been treated for the past 2 days due to C. difficile colitis, electrolyte abnormalities, sinus tachycardia with QT prolongation that has now converted into atrial fibrillation with RVR . Cardiology consulted and patient started on IV amiodarone and IV heparin. She was transitioned to oral amiodarone and eliquis. X-ray and CT of the right knee shows possible loosening of previous prosthetic knee, orthopedic consulted.  Orthopedic recommending eventually will need surgical intervention once medically stable.  Currently awaiting electrolytes to stabilize and her diarrhea to improve.  In the meantime TOC working to help her with home DME equipment.  Curb sided [place] gastro[facility]ology, to cholestyramine added.  Electrolytes slowly improving but x-ray still shows significant ileus.  [facility] consulted, CT abdomen pelvis ordered.   Assessment & Plan:  Principal Problem:   C. difficile colitis Active Problems:   Essential hypertension   Prediabetes   Pure hypercholesterolemia   Peripheral neuropathy due to chemotherapy (HCC)   Pressure injury of skin   Atrial fibrillation with RVR (HCC)   Malnutrition of moderate degree    Pancolitis secondary to C. difficile infection Ileus - Patient completed 10 days of oral Dificid.  Still continues to have watery stool.  X-ray showed ileus.  Case discussed with Dr. Johnnye Sima did not recommend further diarrhea as her leukocytosis and symptoms have improved.  Unfortunately postinfection now she is ileus and  its persisted despite of aggressive repletion of electrolytes over several days. - Patient did not improve much with cholestyramine.  Still has persistent ileus, [facility] consulted.  CT abdomen pelvis with contrast ordered.  Addendum 520pm CT reviewed, Ileus slightly better compared to previous CT. Will need to continue to monitor.    Atrial fibrillation with RVR, Improved -Seen by [facility] cardiology.  Currently on amiodarone, p.o. metoprolol and anticoagulation.-TSH within normal limits. F/u with PCP   Prolonged QTc  Optimize electrolytes   Hypokalemia/hypomag dysemia secondary to GI losses - Potassium improved, replete magnesium  Abnormal right knee x-ray consistent with loosening hardware of previous prosthetic. - X-ray and CT scan of right knee done.  [facility] by [facility], eventually will require surgical revision once medically stable.  Plans will be for outpatient intervention.  In the meantime advised touchdown weightbearing with knee immobilizer and walker with 1-2 person support at all times.   Mild thrombocytopenia Acquired pancytopenia - Hemoglobin stable, no obvious signs of blood loss transfuse as necessary. Status post PRBC transfusion 2/13 Hemoccult positive-expected in setting of colitis   Stage 4 Uterine cancer S/p robotic assisted laparoscopic total hysterectomy with bilateral salpingo-oophorectomy. S/p reduced dose of chemotherapy completed 6 cycles.  Follows up with Dr. Alvy Bimler. Peripheral neuropathy from chemotherapy  Current PT recommendations-SNF but plans to go home with home health  Due to patient's knee issues, she would benefit from hospital bed at home, wheelchair, walker and a bedside commode   DVT prophylaxis: Eliquis Code Status: Full code Family Communication: Daughters are present at bedside  Status is: Inpatient Awaiting improvement in her ileus  Nutritional status    Signs/Symptoms: mild fat depletion, mild muscle  depletion  Interventions: Refer to RD note for recommendations, Magic  cup, MVI  Body mass index is 30.17 kg/m.  Pressure Injury 03/21/22 Sacrum Mid Stage 3 -  Full thickness tissue loss. Subcutaneous fat may be visible but bone, tendon or muscle are NOT exposed. Red (Active)  03/21/22 0800  Location: Sacrum  Location Orientation: Mid  Staging: Stage 3 -  Full thickness tissue loss. Subcutaneous fat may be visible but bone, tendon or muscle are NOT exposed.  Wound Description (Comments): Red  Present on Admission: Yes     Subjective: Seen and examined at bedside, working with therapy.  Denies any nausea and vomiting.  Less frequency of her watery stool.  Daughter reports is also less liquidy. Examination: Constitutional: Not in acute distress Respiratory: Clear to auscultation bilaterally Cardiovascular: Normal sinus rhythm, no rubs Abdomen: Soft, very minimal abdominal distention.  Positive bowel sounds. Musculoskeletal: No edema noted Skin: No rashes seen Neurologic: CN 2-12 grossly intact.  And nonfocal Psychiatric: Normal judgment and insight. Alert and oriented x 3. Normal mood. Right chest wall port in place External catheter Rectal tube  Objective: Vitals:   04/02/22 0455 04/02/22 1035 04/02/22 1204 04/02/22 2008  BP: 125/76 115/77 118/83 118/75  Pulse: 90 90 86 95  Resp:   16 16  Temp: 98.5 F (36.9 C)  98.3 F (36.8 C) 99.1 F (37.3 C)  TempSrc: Oral  Oral Oral  SpO2: 100%  96% 97%  Weight:      Height:        Intake/Output Summary (Last 24 hours) at 04/03/2022 0736 Last data filed at 04/02/2022 2300 Gross per 24 hour  Intake 750 ml  Output 1200 ml  Net -450 ml   Filed Weights   03/23/22 2016 03/26/22 1113  Weight: 82.2 kg 90 kg     Data Reviewed:   CBC: Recent Labs  Lab 03/28/22 0500 03/29/22 0255 03/30/22 0424 03/31/22 0316 04/01/22 0323  WBC 3.9* 3.5* 3.8* 4.6 4.2  HGB 8.7* 8.5* 8.6* 9.0* 8.7*  HCT 27.9* 27.4* 27.7* 28.4* 27.6*  MCV  91.2 93.2 93.0 93.4 93.6  PLT 124* 138* 158 183 0000000   Basic Metabolic Panel: Recent Labs  Lab 03/30/22 0424 03/30/22 1818 03/31/22 0316 04/01/22 0323 04/02/22 0356 04/02/22 1058 04/03/22 0400  NA 134*  --  135 133*  --  138 136  K 2.9* 3.3* 3.6 3.3*  --  3.8 3.9  CL 101  --  102 99  --  98 99  CO2 26  --  26 27  --  25 25  GLUCOSE 86  --  93 82  --  80 74  BUN 5*  --  5* <5*  --  <5* 6*  CREATININE 0.52  --  0.48 0.52  --  0.60 0.63  CALCIUM 7.6*  --  7.9* 7.8*  --  8.3* 8.3*  MG 1.6* 1.8 1.9 1.3* 1.6*  --  1.6*  PHOS 4.2  --  4.0 4.2 4.6  --  4.3   GFR: Estimated Creatinine Clearance: 73.5 mL/min (by C-G formula based on SCr of 0.63 mg/dL). Liver Function Tests: No results for input(s): "AST", "ALT", "ALKPHOS", "BILITOT", "PROT", "ALBUMIN" in the last 168 hours.  No results for input(s): "LIPASE", "AMYLASE" in the last 168 hours. No results for input(s): "AMMONIA" in the last 168 hours. Coagulation Profile: No results for input(s): "INR", "PROTIME" in the last 168 hours. Cardiac Enzymes: No results for input(s): "CKTOTAL", "CKMB", "CKMBINDEX", "TROPONINI" in the last 168 hours. BNP (last 3 results) No results for input(s): "PROBNP"  in the last 8760 hours. HbA1C: No results for input(s): "HGBA1C" in the last 72 hours. CBG: No results for input(s): "GLUCAP" in the last 168 hours.  Lipid Profile: No results for input(s): "CHOL", "HDL", "LDLCALC", "TRIG", "CHOLHDL", "LDLDIRECT" in the last 72 hours. Thyroid Function Tests: No results for input(s): "TSH", "T4TOTAL", "FREET4", "T3FREE", "THYROIDAB" in the last 72 hours. Anemia Panel: No results for input(s): "VITAMINB12", "FOLATE", "FERRITIN", "TIBC", "IRON", "RETICCTPCT" in the last 72 hours.  Sepsis Labs: No results for input(s): "PROCALCITON", "LATICACIDVEN" in the last 168 hours.  Recent Results (from the past 240 hour(s))  Culture, blood (Routine X 2) w Reflex to ID Panel     Status: None   Collection Time:  03/24/22 12:44 PM   Specimen: BLOOD  Result Value Ref Range Status   Specimen Description   Final    BLOOD BLOOD LEFT ARM Performed at [facility] [address] [place], [place] [address]    Special Requests   Final    BOTTLES DRAWN AEROBIC ONLY Blood Culture adequate volume Performed at [facility] [address] [place], [place] [address]    Culture   Final    NO GROWTH 5 DAYS Performed at [facility], [place] [address], [place], [place] [address]    Report Status 03/29/2022 FINAL  Final  Culture, blood (Routine X 2) w Reflex to ID Panel     Status: None   Collection Time: 03/24/22 12:44 PM   Specimen: BLOOD  Result Value Ref Range Status   Specimen Description   Final    BLOOD BLOOD LEFT HAND Performed at [facility] [address], [place], [place] [address]    Special Requests   Final    BOTTLES DRAWN AEROBIC ONLY Blood Culture adequate volume Performed at [facility] [address], [place], [place] [address]    Culture   Final    NO GROWTH 5 DAYS Performed at [facility], [place] [address] [place],  [address]    Report Status 03/29/2022 FINAL  Final         Radiology Studies: DG Abd 1 View  Result Date: 04/01/2022 CLINICAL DATA:  [id number] Abdominal distention [id number] EXAM: ABDOMEN - 1 VIEW COMPARISON:  03/22/2022 FINDINGS: Persistent marked gaseous distension of the colon as well as several prominent loops of small bowel, pattern very similar to the previous study. No gross free intraperitoneal air. Streaky bibasilar opacities. IMPRESSION: Persistent marked gaseous distension of the colon as well as several prominent loops of small bowel, pattern very similar to the previous study and favors ileus. Electronically Signed   By: Davina Poke D.O.   On: 04/01/2022 08:37        Scheduled Meds:  amiodarone  200 mg Oral BID   Followed by   Derrill Memo ON 04/06/2022]  amiodarone  200 mg Oral Daily   apixaban  5 mg Oral BID   ascorbic acid  500 mg Oral Daily   atorvastatin  10 mg Oral QHS   Chlorhexidine Gluconate Cloth  6 each Topical Q0600   cholestyramine light  4 g Oral 4 times per day   famotidine  20 mg Oral Daily   ferrous sulfate  325 mg Oral Q breakfast   metoprolol tartrate  12.5 mg Oral BID   multivitamin with minerals  1 tablet Oral Daily   sodium chloride flush  10-40 mL Intracatheter Q12H   Continuous Infusions:  magnesium sulfate bolus  IVPB       LOS: 12 days   Time spent= 35 mins    Shayn Madole Arsenio Loader, MD [facility]  If 7PM-7AM, please contact night-coverage  04/03/2022, 7:36 AM

## 2022-04-03 NOTE — Telephone Encounter (Signed)
Error : Patient is still admitted in the hospital.

## 2022-04-04 DIAGNOSIS — A0472 Enterocolitis due to Clostridium difficile, not specified as recurrent: Secondary | ICD-10-CM | POA: Diagnosis not present

## 2022-04-04 DIAGNOSIS — E876 Hypokalemia: Secondary | ICD-10-CM | POA: Insufficient documentation

## 2022-04-04 DIAGNOSIS — K567 Ileus, unspecified: Secondary | ICD-10-CM | POA: Insufficient documentation

## 2022-04-04 LAB — BASIC METABOLIC PANEL
Anion gap: 14 (ref 5–15)
BUN: 6 mg/dL — ABNORMAL LOW (ref 8–23)
CO2: 23 mmol/L (ref 22–32)
Calcium: 8 mg/dL — ABNORMAL LOW (ref 8.9–10.3)
Chloride: 97 mmol/L — ABNORMAL LOW (ref 98–111)
Creatinine, Ser: 0.53 mg/dL (ref 0.44–1.00)
GFR, Estimated: >60 mL/min (ref >60)
Glucose, Bld: 66 mg/dL — ABNORMAL LOW (ref 70–99)
Potassium: 3.4 mmol/L — ABNORMAL LOW (ref 3.5–5.1)
Sodium: 134 mmol/L — ABNORMAL LOW (ref 135–145)

## 2022-04-04 LAB — GLUCOSE, CAPILLARY
Glucose-Capillary: 69 mg/dL — ABNORMAL LOW (ref 70–99)
Glucose-Capillary: 89 mg/dL (ref 70–99)

## 2022-04-04 LAB — MAGNESIUM: Magnesium: 1.4 mg/dL — ABNORMAL LOW (ref 1.7–2.4)

## 2022-04-04 LAB — PHOSPHORUS: Phosphorus: 4.1 mg/dL (ref 2.5–4.6)

## 2022-04-04 MED ORDER — DEXTROSE 50 % IV SOLN
12.5000 g | INTRAVENOUS | Status: AC
  Administered 2022-04-04: 12.5 g via INTRAVENOUS
  Filled 2022-04-04: qty 50

## 2022-04-04 MED ORDER — MAGNESIUM SULFATE 4 GM/100ML IV SOLN
4.0000 g | Freq: Once | INTRAVENOUS | Status: AC
  Administered 2022-04-04: 4 g via INTRAVENOUS
  Filled 2022-04-04: qty 100

## 2022-04-04 MED ORDER — POTASSIUM CHLORIDE CRYS ER 20 MEQ PO TBCR
40.0000 meq | EXTENDED_RELEASE_TABLET | Freq: Two times a day (BID) | ORAL | Status: AC
  Administered 2022-04-04 (×2): 40 meq via ORAL
  Filled 2022-04-04 (×2): qty 2

## 2022-04-04 NOTE — Assessment & Plan Note (Signed)
-   Supplement magnesium 

## 2022-04-04 NOTE — Assessment & Plan Note (Addendum)
Treated with 10 days fidaxomicin.  WBC and fever resolved. Stool output and fever curve/WBC seem normal.

## 2022-04-04 NOTE — Assessment & Plan Note (Signed)
BP soft - Continue meto;rolol

## 2022-04-04 NOTE — Progress Notes (Signed)
Subjective: Patient's rectal tube was removed last night. She had 2 loose watery bowel movements today morning. She denies abdominal pain or nausea, has been started on clear liquid diet but states she has no appetite.  Objective: Vital signs in last 24 hours: Temp:  [98.2 F (36.8 C)-98.7 F (37.1 C)] 98.3 F (36.8 C) (02/24 1322) Pulse Rate:  [90-96] 96 (02/24 1322) Resp:  [17-23] 17 (02/24 1322) BP: (100-108)/(63-74) 108/74 (02/24 1322) SpO2:  [97 %-100 %] 99 % (02/24 1322) Weight change:  Last BM Date : 04/03/22  PE: Ill-appearing GENERAL: Sitting up on bed, on room air, able to speak in few words, not using accessory muscles of respiration  ABDOMEN: Slightly distended but nontender, hyperactive bowel sounds EXTREMITIES: No deformity, no edema  Lab Results: Results for orders placed or performed during the hospital encounter of 03/21/22 (from the past 48 hour(s))  Magnesium     Status: Abnormal   Collection Time: 04/03/22  4:00 AM  Result Value Ref Range   Magnesium 1.6 (L) 1.7 - 2.4 mg/dL    Comment: Performed at [facility], [place] [address] [place], [place] [address]  Phosphorus     Status: None   Collection Time: 04/03/22  4:00 AM  Result Value Ref Range   Phosphorus 4.3 2.5 - 4.6 mg/dL    Comment: Performed at [facility], [place] [address] [place], [place] [id number]  Basic metabolic panel     Status: Abnormal   Collection Time: 04/03/22  4:00 AM  Result Value Ref Range   Sodium 136 135 - 145 mmol/L   Potassium 3.9 3.5 - 5.1 mmol/L   Chloride 99 98 - 111 mmol/L   CO2 25 22 - 32 mmol/L   Glucose, Bld 74 70 - 99 mg/dL    Comment: Glucose reference range applies only to samples taken after fasting for at least 8 hours.   BUN 6 (L) 8 - 23 mg/dL   Creatinine, Ser 0.63 0.44 - 1.00 mg/dL   Calcium 8.3 (L) 8.9 - 10.3 mg/dL   GFR, Estimated >60 >60 mL/min    Comment: (NOTE) Calculated using the CKD-EPI Creatinine Equation (2021)     Anion gap 12 5 - 15    Comment: Performed at [facility], [place] [address], [place], [place] [address]  Magnesium     Status: Abnormal   Collection Time: 04/04/22  3:25 AM  Result Value Ref Range   Magnesium 1.4 (L) 1.7 - 2.4 mg/dL    Comment: Performed at [facility], [place] [address], [place], [place] [address]  Phosphorus     Status: None   Collection Time: 04/04/22  3:25 AM  Result Value Ref Range   Phosphorus 4.1 2.5 - 4.6 mg/dL    Comment: Performed at [facility], [place] [address], [place], [place] [id number]  Basic metabolic panel     Status: Abnormal   Collection Time: 04/04/22  3:25 AM  Result Value Ref Range   Sodium 134 (L) 135 - 145 mmol/L   Potassium 3.4 (L) 3.5 - 5.1 mmol/L   Chloride 97 (L) 98 - 111 mmol/L   CO2 23 22 - 32 mmol/L   Glucose, Bld 66 (L) 70 - 99 mg/dL    Comment: Glucose reference range applies only to samples taken after fasting for at least 8 hours.   BUN 6 (L) 8 - 23 mg/dL   Creatinine, Ser 0.53 0.44 - 1.00 mg/dL  Calcium 8.0 (L) 8.9 - 10.3 mg/dL   GFR, Estimated >60 >60 mL/min    Comment: (NOTE) Calculated using the CKD-EPI Creatinine Equation (2021)    Anion gap 14 5 - 15    Comment: Performed at [facility], [place] [address] [place], [place] [address]  Glucose, capillary     Status: Abnormal   Collection Time: 04/04/22  4:53 AM  Result Value Ref Range   Glucose-Capillary 69 (L) 70 - 99 mg/dL    Comment: Glucose reference range applies only to samples taken after fasting for at least 8 hours.  Glucose, capillary     Status: None   Collection Time: 04/04/22  5:50 AM  Result Value Ref Range   Glucose-Capillary 89 70 - 99 mg/dL    Comment: Glucose reference range applies only to samples taken after fasting for at least 8 hours.    Studies/Results: CT ABDOMEN PELVIS W CONTRAST  Result Date: 04/03/2022 CLINICAL DATA:  Bowel obstruction suspected. History of C  difficile colitis. EXAM: CT ABDOMEN AND PELVIS WITH CONTRAST TECHNIQUE: Multidetector CT imaging of the abdomen and pelvis was performed using the standard protocol following bolus administration of intravenous contrast. RADIATION DOSE REDUCTION: This exam was performed according to the departmental dose-optimization program which includes automated exposure control, adjustment of the mA and/or kV according to patient size and/or use of iterative reconstruction technique. CONTRAST:  120m OMNIPAQUE IOHEXOL 300 MG/ML  SOLN COMPARISON:  CT abdomen and pelvis 03/22/2022, 03/19/2022 and 11/21/2021 FINDINGS: Lower chest: 6 mm nodule in the medial right lower lobe on image 11/4 minimally changed since 11/21/2021. Compressive atelectasis at both lung bases with trace bilateral pleural effusions. Stable tiny nodules in the right middle lobe on image 9/4. Central line tip near the superior cavoatrial junction. Small nodule anterior to the heart measures 9 mm on image 8/2 and minimally changed since 11/21/2021. Hepatobiliary: Normal appearance of the liver. Limited evaluation of the gallbladder due to external compression from the adjacent colon. No significant biliary dilatation. Pancreas: Unremarkable. No pancreatic ductal dilatation or surrounding inflammatory changes. Spleen: Normal in size without focal abnormality. Adrenals/Urinary Tract: Normal appearance of the adrenal glands. Probable small cyst in the posterior right kidney that does not require dedicated follow-up. No hydronephrosis and no suspicious renal lesions. Normal appearance of the urinary bladder. Probable small parapelvic cyst in left kidney lower pole region. Stomach/Bowel: Stomach is decompressed. Probable lipoma near the duodenal bulb region. Duodenum is decompressed. Again noted is gaseous distension throughout the colon. Cecum is located at the anterior midline and measures up to 7.7 cm in diameter and previously measured 9.5 cm. Oral contrast in a  normal appearing appendix. Mid transverse colon measures up to 6.0 cm in diameter and previously measured 7.3 cm. Mild wall thickening in the sigmoid colon probably represents underlying diverticular changes. Mild distention of the rectum with fluid. There is a rectal tube with inflated balloon. No evidence for a mass lesion. No focal bowel inflammation. Vascular/Lymphatic: Small retroperitoneal lymph nodes have decreased in size since 11/21/2021. Index lymph node anterior to the IVC on image 53/2 measures 5 mm in the short axis and previously measured 8 mm. Nodular tissue adjacent to the suprarenal IVC on image 25/2 measures 11 mm in the short axis and previously measured 17 mm in the short axis. No suspicious lymph node enlargement. Minimal atherosclerotic disease in the abdominal aorta without aneurysm. 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 This thrombus actually appears to  be slightly smaller and causing less vein distension compared to the comparison exam on 03/19/2022. The left ovarian vein thrombus is best seen on image 41/5. Reproductive: History of total hysterectomy with bilateral salpingectomy oophorectomy. Small amount of low-density material in the left adnexal region likely represents postoperative changes. Other: Diffuse subcutaneous edema. Persistent presacral edema. Negative for free air. Small umbilical hernia containing fat. Musculoskeletal: Again noted is a sclerotic lesion along the inferior endplate L1. This lesion has not significantly changed since 11/21/2021 but new since 09/01/2021. Disc space narrowing and endplate changes at 624THL. Anterolisthesis of L3 on L4. Degenerative facet disease in lumbar spine. No acute bone abnormality. Multilevel disc bulges in the lumbar spine. IMPRESSION: 1. Persistent gaseous distension throughout the colon. The colonic gaseous distension has slightly decreased since 03/22/2022 as described. There is now a rectal tube with an  inflated balloon in the rectum. 2. No significant wall thickening involving the colon. No evidence for an obstructive lesion. 3. Thrombus involving the left ovarian vein. Thrombus appears to be slightly smaller and causing less vein distension compared to recent comparison exams. 4. Atelectasis at both lung bases with trace bilateral pleural effusions. 5. Stable sclerotic lesion involving L1 vertebral body. This is suggestive for a bone metastasis and may represent treated disease based on the sclerosis. Electronically Signed   By: Markus Daft M.D.   On: 04/03/2022 16:50   DG Abd 1 View  Result Date: 04/03/2022 CLINICAL DATA:  Abdominal distention EXAM: ABDOMEN - 1 VIEW COMPARISON:  Abdominal radiograph dated 04/01/2022 FINDINGS: Interval increase in marked dilation of gas-filled large bowel. Rectal tube in-situ. No acute osseous abnormality. IMPRESSION: Interval increase in marked dilation of gas-filled large bowel. Electronically Signed   By: Darrin Nipper M.D.   On: 04/03/2022 08:11    Medications: I have reviewed the patient's current medications.  Assessment: History of C. difficile colitis, status post treatment with Dificid Mild hyponatremia, mild hypokalemia, hypomagnesemia Normocytic anemia CT showed persistent gaseous distention throughout colon which has decreased without significant wall thickening or obstructive lesion  Plan: Clear liquid diet started today, if tolerated will advance to full liquids in a.m. tomorrow. As per daughter, cholestyramine constipated her. Will continue to monitor, agree with potassium replacement and magnesium replacement to avoid colonic ileus.  Ronnette Juniper, MD 04/04/2022, 1:48 PM

## 2022-04-04 NOTE — Progress Notes (Signed)
  Progress Note   Patient: Sarah Wu [id number] DOB: 06/11/1950 DOA: 03/21/2022     13 DOS: the patient was seen and examined on 04/04/2022 at 11:10AM      Brief hospital course: 72 y.o. female with medical history significant of prediabetes, osteoarthritisStatus post right total knee replacement, fibroid tumor, hyperlipidemia, hypertension, postmenopausal bleeding, peripheral neuropathy, stage IV uterine cancer with pancytopenia who was transferred to this facility from [facility] where she has been treated for the past 2 days due to C. difficile colitis, electrolyte abnormalities, sinus tachycardia with QT prolongation that has now converted into atrial fibrillation with RVR . Cardiology consulted and patient started on IV amiodarone and IV heparin. She was transitioned to oral amiodarone and eliquis. X-ray and CT of the right knee shows possible loosening of previous prosthetic knee, orthopedic consulted.  Orthopedic recommending eventually will need surgical intervention once medically stable.  Currently awaiting electrolytes to stabilize and her diarrhea to improve.  In the meantime TOC working to help her with home DME equipment.  Curb sided [place] gastro[facility], to cholestyramine added.  Electrolytes slowly improving but x-ray still shows significant ileus.  [facility] consulted, CT abdomen pelvis ordered.       Assessment and Plan: * C. difficile pancolitis Resolved  Ileus ([place]) - Supplement mag and K - Remove rectal tube - Advance diet as tolerated  Atrial fibrillation with RVR (HCC) Resolved - Continue apixaban - Continue amiodarone - Supplement mag and K - Continue metoprolol  Hypomagnesemia - Supplement magnesium  Hypokalemia - Supplement K  Protein-calorie malnutrition, severe (HCC) As evidenced by loss of subcutaneous muscle mass and fat, in setting of >10% weight loss in 3 months (93kg Nov 2023, 83kg Feb 2024)  - Consult dietititan -  Continue nutritional supplements  Peripheral neuropathy due to chemotherapy (HCC)    Pancytopenia, acquired ([place])    Uterine cancer ([place])    Essential hypertension BP soft - Continue meto;rolol          Subjective: Weak but getting stronger.  3BM in last 24 hours.  No fever.  No confusion. BMs more solid.     Physical Exam: BP 108/74 (BP Location: Right Arm)   Pulse 96   Temp 98.3 F (36.8 C) (Oral)   Resp 17   Ht 5' 8"$  (1.727 m)   Wt 90 kg   SpO2 99%   BMI 30.17 kg/m   Thin adult female, lying in bed, appears weak and tired RRR, no murmurs, no peripheral edema Respiratory rate normal, lungs clear without rales or wheezes Abdomen without tenderness, bowel sounds positive Attention normal, affect appropriate, judgment and insight appear normal  Data Reviewed: Potassium 3.4, magnesium 1.4 Sodium normal Creatinine normal Glucose level overnight CT of the abdomen and pelvis showed improved ileus overnight    Family Communication: Daughters at the bedside    Disposition: Status is: Inpatient         Author: Edwin Dada, MD 04/04/2022 7:40 PM  For on call review [email or web address].

## 2022-04-04 NOTE — Assessment & Plan Note (Signed)
As evidenced by loss of subcutaneous muscle mass and fat, in setting of >10% weight loss in 3 months (93kg Nov 2023, 83kg Feb 2024)  - Consult dietititan - Continue nutritional supplements

## 2022-04-04 NOTE — Assessment & Plan Note (Signed)
No pain.  Having gas, BM, no distension.  Cross-sectional imaging 2/23 showed improvement in the ileus, and clinically over the last 2 days this seems resolved. - Supplement magnesium - Advance diet to regular - PT eval

## 2022-04-04 NOTE — Hospital Course (Signed)
Sarah Wu is a 72 y.o. F with uterine CA, HTN who was transferred from OSH in setting of C diff colitis, new Afib with RVR.   2/7: Admitted to [facility] for syncope/near syncope, found to have abdominal pain, diarrhea 2/8: CT showed pancolitis, started on cefepime, flagyl  2/9: Given ongoing diarrhea, Cdiff toxin and antigen was found to be +, started on Difcid; new onset Afib noted    2/10: Transferred to [place] at family request, Cardiology consulted; amiodarone and heparin started 2/11: CT shows adynamic ileus, also colitis persists 2/15: Orthopedics consulted for loosening knee hardware --> defer to outpatient setting 2/19: Completed 10 days Difcid 2/21: Abd x-ray shows persistent ileus 2/23: GI consulted for ongoing diarrhea, CT shows improved ileus, rectal tube removed 2/26: Stool frequency improving

## 2022-04-04 NOTE — Assessment & Plan Note (Signed)
Resolved - Continue apixaban - Continue amiodarone - Supplement mag and K - Continue metoprolol

## 2022-04-05 DIAGNOSIS — K567 Ileus, unspecified: Secondary | ICD-10-CM

## 2022-04-05 DIAGNOSIS — A0472 Enterocolitis due to Clostridium difficile, not specified as recurrent: Secondary | ICD-10-CM | POA: Diagnosis not present

## 2022-04-05 DIAGNOSIS — I4891 Unspecified atrial fibrillation: Secondary | ICD-10-CM | POA: Diagnosis not present

## 2022-04-05 DIAGNOSIS — E876 Hypokalemia: Secondary | ICD-10-CM

## 2022-04-05 DIAGNOSIS — I10 Essential (primary) hypertension: Secondary | ICD-10-CM | POA: Diagnosis not present

## 2022-04-05 LAB — CBC
HCT: 28.6 % — ABNORMAL LOW (ref 36.0–46.0)
Hemoglobin: 8.7 g/dL — ABNORMAL LOW (ref 12.0–15.0)
MCH: 28.7 pg (ref 26.0–34.0)
MCHC: 30.4 g/dL (ref 30.0–36.0)
MCV: 94.4 fL (ref 80.0–100.0)
Platelets: 231 10*3/uL (ref 150–400)
RBC: 3.03 MIL/uL — ABNORMAL LOW (ref 3.87–5.11)
RDW: 19.5 % — ABNORMAL HIGH (ref 11.5–15.5)
WBC: 4.5 10*3/uL (ref 4.0–10.5)
nRBC: 0 % (ref 0.0–0.2)

## 2022-04-05 LAB — BASIC METABOLIC PANEL
Anion gap: 11 (ref 5–15)
BUN: <5 mg/dL — ABNORMAL LOW (ref 8–23)
CO2: 26 mmol/L (ref 22–32)
Calcium: 8.1 mg/dL — ABNORMAL LOW (ref 8.9–10.3)
Chloride: 98 mmol/L (ref 98–111)
Creatinine, Ser: 0.6 mg/dL (ref 0.44–1.00)
GFR, Estimated: >60 mL/min (ref >60)
Glucose, Bld: 78 mg/dL (ref 70–99)
Potassium: 3.8 mmol/L (ref 3.5–5.1)
Sodium: 135 mmol/L (ref 135–145)

## 2022-04-05 LAB — MAGNESIUM: Magnesium: 1.4 mg/dL — ABNORMAL LOW (ref 1.7–2.4)

## 2022-04-05 LAB — PHOSPHORUS: Phosphorus: 3.6 mg/dL (ref 2.5–4.6)

## 2022-04-05 MED ORDER — MEDIHONEY WOUND/BURN DRESSING EX PSTE
1.0000 | PASTE | Freq: Every day | CUTANEOUS | Status: DC
  Administered 2022-04-05 – 2022-04-08 (×4): 1 via TOPICAL
  Filled 2022-04-05: qty 44

## 2022-04-05 MED ORDER — MAGNESIUM SULFATE 2 GM/50ML IV SOLN
2.0000 g | Freq: Once | INTRAVENOUS | Status: AC
  Administered 2022-04-05: 2 g via INTRAVENOUS
  Filled 2022-04-05: qty 50

## 2022-04-05 NOTE — Progress Notes (Signed)
Progress Note   Patient: Sarah Wu [id number] DOB: [birth year] DOA: 03/21/2022     14 DOS: the patient was seen and examined on 04/05/2022 at 9:00AM      Brief hospital course: Sarah Wu is a 72 y.o. F with uterine CA, HTN who was transferred from OSH in setting of C diff colitis, new Afib with RVR.   2/7: Admitted to [facility] for syncope/near syncope, found to have abdominal pain, diarrhea 2/8: CT showed pancolitis, started on cefepime, flagyl  2/9: Given ongoing diarrhea, Cdiff toxin and antigen was found to be +, started on Difcid; new onset Afib noted    2/10: Transferred to [facility] at family request, Cardiology consulted; amiodarone and heparin started 2/11: CT shows adynamic ileus, also colitis persists 2/15: Orthopedics consulted for loosening knee hardware --> defer to outpatient setting 2/19: Completed 10 days Difcid 2/21: Abd x-ray shows persistent ileus 2/23: GI consulted for ongoing diarrhea, CT shows improved ileus, rectal tube removed     Assessment and Plan: * C. difficile pancolitis Treated with 10 days fidaxomicin.  WBC and fever resolved. - Monitor stool output and fever curve/WBC  Ileus ([place]) Cross-sectional imaging 2/23 showed improvement in the ileus, when she is passing gas and having bowel movements, and tolerating oral diet well so far - Supplement magnesium - Advance diet as tolerated - PT eval     Atrial fibrillation with RVR ([place]) New onset this admission.  CHA2DS2-Vasc 3.  Started anticoagulation this admission, no bleding complications. - Continue apixaban - Continue amiodarone - Supplement mag and K - Continue metoprolol  Hypomagnesemia - Supplement magnesium  Hypokalemia    Protein-calorie malnutrition, severe ([place]) As evidenced by loss of subcutaneous muscle mass and fat, in setting of >10% weight loss in 3 months (93kg Nov 2023, 83kg Feb 2024)  - Consult dietititan - Continue nutritional  supplements  Pressure injury of skin Sacrum stage III, present on admission Left buttock stage 2, not present on admission  Peripheral neuropathy due to chemotherapy ([place])    Pancytopenia, acquired ([place]) Due to chemotherapy.  Hgb stable.  Platelets and WBC have improved. 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  On chemo-radiation with Dr. Alvy Bimler.  Pure hypercholesterolemia - Continue Lipitor  Essential hypertension BP soft  - Continue metoprolol          Subjective: Patient is tired, she has some cramping abdominal pain, she has had 5 bowel movements in the last 24 hours.  These are mostly soft or liquid.  No fever, no confusion, no vomiting.     Physical Exam: BP 103/69 (BP Location: Right Arm)   Pulse 88   Temp 98.2 F (36.8 C) (Oral)   Resp 20   Ht '5\' 8"'$  (1.727 m)   Wt 90 kg   SpO2 100%   BMI 30.17 kg/m   Elderly adult female, lying in bed, appears chronically ill RRR, no murmurs, mild nonpitting diffuse peripheral edema Respiratory rate seems normal, lung sounds diminished but overall without rales or wheezes Abdomen soft without tenderness palpation, no guarding, no rigidity, no ascites Attention normal, affect blunted, judgment and insight appear normal, face symmetric, speech fluent, severe generalized weakness    Data Reviewed: Magnesium still 1.4 Basic metabolic panel shows normal renal function and potassium Hemoglobin 8.7, no change, white blood cell count within normal limits  Family Communication: Daughters at the bedside    Disposition: Status is: Inpatient         Chief Strategy Officer: Sarah Wu  Sarah Asmus, MD 04/05/2022 12:13 PM  For on call review [email or web address].

## 2022-04-05 NOTE — Assessment & Plan Note (Signed)
Sacrum stage III, present on admission Left buttock stage 2, not present on admission - [place] honey, daily dressing changes - Mobilization, limit supine, offloading

## 2022-04-05 NOTE — Assessment & Plan Note (Signed)
BMI 30.18

## 2022-04-05 NOTE — Assessment & Plan Note (Signed)
-  Continue Lipitor °

## 2022-04-05 NOTE — Progress Notes (Signed)
Subjective: Had 2 bowel movements yesterday and has already had 5 bowel movements today however has not had fecal accidents and the consistency of bowel movement is not as watery.  Objective: Vital signs in last 24 hours: Temp:  [98.2 F (36.8 C)-98.4 F (36.9 C)] 98.4 F (36.9 C) (02/25 1305) Pulse Rate:  [88-96] 96 (02/25 1305) Resp:  [15-20] 15 (02/25 1305) BP: (103-105)/(66-80) 103/80 (02/25 1305) SpO2:  [99 %-100 %] 100 % (02/25 1305) Weight change:  Last BM Date : 04/04/22  [id number] up on bed GENERAL: Not in distress ABDOMEN: Distended abdomen but soft, nontender, hyperactive bowel sounds EXTREMITIES: No deformity  Lab Results: Results for orders placed or performed during the hospital encounter of 03/21/22 (from the past 48 hour(s))  Magnesium     Status: Abnormal   Collection Time: 04/04/22  3:25 AM  Result Value Ref Range   Magnesium 1.4 (L) 1.7 - 2.4 mg/dL    Comment: Performed at [facility], [place] [address] [place], [place] [address]  Phosphorus     Status: None   Collection Time: 04/04/22  3:25 AM  Result Value Ref Range   Phosphorus 4.1 2.5 - 4.6 mg/dL    Comment: Performed at [facility], [place] [address] [place], [place] [id number]  Basic metabolic panel     Status: Abnormal   Collection Time: 04/04/22  3:25 AM  Result Value Ref Range   Sodium 134 (L) 135 - 145 mmol/L   Potassium 3.4 (L) 3.5 - 5.1 mmol/L   Chloride 97 (L) 98 - 111 mmol/L   CO2 23 22 - 32 mmol/L   Glucose, Bld 66 (L) 70 - 99 mg/dL    Comment: Glucose reference range applies only to samples taken after fasting for at least 8 hours.   BUN 6 (L) 8 - 23 mg/dL   Creatinine, Ser 0.53 0.44 - 1.00 mg/dL   Calcium 8.0 (L) 8.9 - 10.3 mg/dL   GFR, Estimated >60 >60 mL/min    Comment: (NOTE) Calculated using the CKD-EPI Creatinine Equation (2021)    Anion gap 14 5 - 15    Comment: Performed at [facility], [place] [address],  [place], [place] [address]  Glucose, capillary     Status: Abnormal   Collection Time: 04/04/22  4:53 AM  Result Value Ref Range   Glucose-Capillary 69 (L) 70 - 99 mg/dL    Comment: Glucose reference range applies only to samples taken after fasting for at least 8 hours.  Glucose, capillary     Status: None   Collection Time: 04/04/22  5:50 AM  Result Value Ref Range   Glucose-Capillary 89 70 - 99 mg/dL    Comment: Glucose reference range applies only to samples taken after fasting for at least 8 hours.  Magnesium     Status: Abnormal   Collection Time: 04/05/22  3:04 AM  Result Value Ref Range   Magnesium 1.4 (L) 1.7 - 2.4 mg/dL    Comment: Performed at [facility], [place] [address] [place], [place] [address]  Phosphorus     Status: None   Collection Time: 04/05/22  3:04 AM  Result Value Ref Range   Phosphorus 3.6 2.5 - 4.6 mg/dL    Comment: Performed at [facility], [place] [address] [place], [place] [id number]  Basic metabolic panel     Status: Abnormal   Collection Time: 04/05/22  3:04 AM  Result Value Ref Range   Sodium  135 135 - 145 mmol/L   Potassium 3.8 3.5 - 5.1 mmol/L   Chloride 98 98 - 111 mmol/L   CO2 26 22 - 32 mmol/L   Glucose, Bld 78 70 - 99 mg/dL    Comment: Glucose reference range applies only to samples taken after fasting for at least 8 hours.   BUN <5 (L) 8 - 23 mg/dL   Creatinine, Ser 0.60 0.44 - 1.00 mg/dL   Calcium 8.1 (L) 8.9 - 10.3 mg/dL   GFR, Estimated >60 >60 mL/min    Comment: (NOTE) Calculated using the CKD-EPI Creatinine Equation (2021)    Anion gap 11 5 - 15    Comment: Performed at [facility], [place] [address] [place], [place] [address]  CBC     Status: Abnormal   Collection Time: 04/05/22  3:04 AM  Result Value Ref Range   WBC 4.5 4.0 - 10.5 K/uL   RBC 3.03 (L) 3.87 - 5.11 MIL/uL   Hemoglobin 8.7 (L) 12.0 - 15.0 g/dL   HCT 28.6 (L) 36.0 - 46.0 %   MCV 94.4 80.0 - 100.0 fL   MCH 28.7 26.0  - 34.0 pg   MCHC 30.4 30.0 - 36.0 g/dL   RDW 19.5 (H) 11.5 - 15.5 %   Platelets 231 150 - 400 K/uL   nRBC 0.0 0.0 - 0.2 %    Comment: Performed at [facility], [place] [address] [place], [place] [address]    Studies/Results: CT ABDOMEN PELVIS W CONTRAST  Result Date: 04/03/2022 CLINICAL DATA:  Bowel obstruction suspected. History of C difficile colitis. EXAM: CT ABDOMEN AND PELVIS WITH CONTRAST TECHNIQUE: Multidetector CT imaging of the abdomen and pelvis was performed using the standard protocol following bolus administration of intravenous contrast. RADIATION DOSE REDUCTION: This exam was performed according to the departmental dose-optimization program which includes automated exposure control, adjustment of the mA and/or kV according to patient size and/or use of iterative reconstruction technique. CONTRAST:  160m OMNIPAQUE IOHEXOL 300 MG/ML  SOLN COMPARISON:  CT abdomen and pelvis 03/22/2022, 03/19/2022 and 11/21/2021 FINDINGS: Lower chest: 6 mm nodule in the medial right lower lobe on image 11/4 minimally changed since 11/21/2021. Compressive atelectasis at both lung bases with trace bilateral pleural effusions. Stable tiny nodules in the right middle lobe on image 9/4. Central line tip near the superior cavoatrial junction. Small nodule anterior to the heart measures 9 mm on image 8/2 and minimally changed since 11/21/2021. Hepatobiliary: Normal appearance of the liver. Limited evaluation of the gallbladder due to external compression from the adjacent colon. No significant biliary dilatation. Pancreas: Unremarkable. No pancreatic ductal dilatation or surrounding inflammatory changes. Spleen: Normal in size without focal abnormality. Adrenals/Urinary Tract: Normal appearance of the adrenal glands. Probable small cyst in the posterior right kidney that does not require dedicated follow-up. No hydronephrosis and no suspicious renal lesions. Normal appearance of the urinary bladder.  Probable small parapelvic cyst in left kidney lower pole region. Stomach/Bowel: Stomach is decompressed. Probable lipoma near the duodenal bulb region. Duodenum is decompressed. Again noted is gaseous distension throughout the colon. Cecum is located at the anterior midline and measures up to 7.7 cm in diameter and previously measured 9.5 cm. Oral contrast in a normal appearing appendix. Mid transverse colon measures up to 6.0 cm in diameter and previously measured 7.3 cm. Mild wall thickening in the sigmoid colon probably represents underlying diverticular changes. Mild distention of the rectum with fluid. There is a rectal tube with inflated balloon.  No evidence for a mass lesion. No focal bowel inflammation. Vascular/Lymphatic: Small retroperitoneal lymph nodes have decreased in size since 11/21/2021. Index lymph node anterior to the IVC on image 53/2 measures 5 mm in the short axis and previously measured 8 mm. Nodular tissue adjacent to the suprarenal IVC on image 25/2 measures 11 mm in the short axis and previously measured 17 mm in the short axis. No suspicious lymph node enlargement. Minimal atherosclerotic disease in the abdominal aorta without aneurysm. 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 This thrombus actually appears to be slightly smaller and causing less vein distension compared to the comparison exam on 03/19/2022. The left ovarian vein thrombus is best seen on image 41/5. Reproductive: History of total hysterectomy with bilateral salpingectomy oophorectomy. Small amount of low-density material in the left adnexal region likely represents postoperative changes. Other: Diffuse subcutaneous edema. Persistent presacral edema. Negative for free air. Small umbilical hernia containing fat. Musculoskeletal: Again noted is a sclerotic lesion along the inferior endplate L1. This lesion has not significantly changed since 11/21/2021 but new since 09/01/2021. Disc space narrowing and  endplate changes at 624THL. Anterolisthesis of L3 on L4. Degenerative facet disease in lumbar spine. No acute bone abnormality. Multilevel disc bulges in the lumbar spine. IMPRESSION: 1. Persistent gaseous distension throughout the colon. The colonic gaseous distension has slightly decreased since 03/22/2022 as described. There is now a rectal tube with an inflated balloon in the rectum. 2. No significant wall thickening involving the colon. No evidence for an obstructive lesion. 3. Thrombus involving the left ovarian vein. Thrombus appears to be slightly smaller and causing less vein distension compared to recent comparison exams. 4. Atelectasis at both lung bases with trace bilateral pleural effusions. 5. Stable sclerotic lesion involving L1 vertebral body. This is suggestive for a bone metastasis and may represent treated disease based on the sclerosis. Electronically Signed   By: Markus Daft M.D.   On: 04/03/2022 16:50    Medications: I have reviewed the patient's current medications.  Assessment: Colonic distention-improved on CAT scan from 2 days ago History of C. 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 Clinically improving, please recall GI if needed, GI will sign off at this point.  Ronnette Juniper, MD 04/05/2022, 2:20 PM

## 2022-04-05 NOTE — Consult Note (Signed)
WOC Nurse Consult Note: Reason for Consult:Stage 3 PI to sacrum Wound type:pressure Pressure Injury POA: Yes Measurement:1cm x 1cm x 0.2cm Wound [id number], red Drainage (amount, consistency, odor) scant serous Periwound: intact, blanching Dressing procedure/placement/frequency:Turning and repositioning is in place, I have added guidance for minimizing time in the supine position. Heels are to be floated using Prevalon boots. Topical care will be with a daily application of leptospermum Manuka honey ([place]) topped with dry gauze and secured with a silicone foam. Turning and repositioning is in place and time in the supine position is to be minimized.  [place] nursing team will not follow, but will remain available to this patient, the nursing and medical teams.  Please re-consult if needed.  Thank you for inviting [place] to participate in this patient's Plan of Care.  Maudie Flakes, MSN, RN, CNS, [place], Serita Grammes, [employer], [employer] phone:  [phone]

## 2022-04-06 LAB — RENAL FUNCTION PANEL
Albumin: 2.1 g/dL — ABNORMAL LOW (ref 3.5–5.0)
Anion gap: 10 (ref 5–15)
BUN: <5 mg/dL — ABNORMAL LOW (ref 8–23)
CO2: 26 mmol/L (ref 22–32)
Calcium: 7.8 mg/dL — ABNORMAL LOW (ref 8.9–10.3)
Chloride: 99 mmol/L (ref 98–111)
Creatinine, Ser: 0.5 mg/dL (ref 0.44–1.00)
GFR, Estimated: >60 mL/min (ref >60)
Glucose, Bld: 84 mg/dL (ref 70–99)
Phosphorus: 3.7 mg/dL (ref 2.5–4.6)
Potassium: 3.2 mmol/L — ABNORMAL LOW (ref 3.5–5.1)
Sodium: 135 mmol/L (ref 135–145)

## 2022-04-06 LAB — CBC
HCT: 28 % — ABNORMAL LOW (ref 36.0–46.0)
Hemoglobin: 8.6 g/dL — ABNORMAL LOW (ref 12.0–15.0)
MCH: 29.3 pg (ref 26.0–34.0)
MCHC: 30.7 g/dL (ref 30.0–36.0)
MCV: 95.2 fL (ref 80.0–100.0)
Platelets: 235 10*3/uL (ref 150–400)
RBC: 2.94 MIL/uL — ABNORMAL LOW (ref 3.87–5.11)
RDW: 19.7 % — ABNORMAL HIGH (ref 11.5–15.5)
WBC: 4.1 10*3/uL (ref 4.0–10.5)
nRBC: 0 % (ref 0.0–0.2)

## 2022-04-06 LAB — MAGNESIUM: Magnesium: 1.5 mg/dL — ABNORMAL LOW (ref 1.7–2.4)

## 2022-04-06 MED ORDER — CALCIUM POLYCARBOPHIL 625 MG PO TABS
625.0000 mg | ORAL_TABLET | Freq: Every day | ORAL | Status: DC
  Administered 2022-04-06 – 2022-04-08 (×3): 625 mg via ORAL
  Filled 2022-04-06 (×3): qty 1

## 2022-04-06 MED ORDER — POTASSIUM CHLORIDE CRYS ER 20 MEQ PO TBCR
40.0000 meq | EXTENDED_RELEASE_TABLET | Freq: Two times a day (BID) | ORAL | Status: AC
  Administered 2022-04-06 (×2): 40 meq via ORAL
  Filled 2022-04-06 (×2): qty 2

## 2022-04-06 MED ORDER — MAGNESIUM SULFATE 2 GM/50ML IV SOLN
2.0000 g | Freq: Once | INTRAVENOUS | Status: AC
  Administered 2022-04-06: 2 g via INTRAVENOUS
  Filled 2022-04-06: qty 50

## 2022-04-06 NOTE — TOC Progression Note (Signed)
Transition of Care [facility]) - Progression Note    Patient Details  Name: Sarah Wu MRN: [id number] Date of Birth: 1950/11/07  Transition of Care [facility]) CM/SW [place], LCSW Phone Number: 04/06/2022, 9:49 AM  Clinical Narrative:     CSW still awaiting one more [place] agency to respond. TOC has tried 6 different [place] agencies and is unable to obtain [facility] services in [facility] where pt resides. Pt will have to follow up OP if the last [facility] agency declines. TOC to follow.          Expected Discharge Plan and Services                                               Social Determinants of Health (SDOH) Interventions SDOH Screenings   Food Insecurity: No Food Insecurity (03/23/2022)  Housing: Low Risk  (03/23/2022)  Transportation Needs: No Transportation Needs (03/23/2022)  Utilities: Not At Risk (03/23/2022)  Tobacco Use: Low Risk  (03/23/2022)    Readmission Risk Interventions     No data to display

## 2022-04-06 NOTE — Progress Notes (Addendum)
Physical Therapy Treatment Patient Details Name: Sarah Wu MRN: [id number] DOB: 20-Feb-1950 Today's Date: 04/06/2022   History of Present Illness Patient is 72 y.o. female who was transferred [facility] from [facility] where she was treated for 2 days due to C. difficile colitis, electrolyte abnormalities, sinus tachycardia with QT prolongation that has now converted into atrial fibrillation with RVR. Imaging (+) for R TK femoral component loosening.  PMH significant for prediabetes, osteoarthritis, fibroid tumor, HLD, HTN, postmenopausal bleeding, peripheral neuropathy, stage IV uterine cancer with pancytopenia.    PT Comments    Pt agreeable to attempting transfer to recliner (with encouragement from her daughter). She continues to require +2 assist for bed mobility and +3 assist for safe scoot transfer bed>recliner. She is still unable to stand 2* weakness and inability to maintain TDWB status on R LE. KI was worn for OOB activity on today. Will continue to follow. Hoyer lift pad in place for nursing to get pt back to bed.    Recommendations for follow up therapy are one component of a multi-disciplinary discharge planning process, led by the attending physician.  Recommendations may be updated based on patient status, additional functional criteria and insurance authorization.  Follow Up Recommendations  Skilled nursing-short term rehab (<3 hours/day) Can patient physically be transported by private vehicle: No   Assistance Recommended at Discharge Frequent or constant Supervision/Assistance  Patient can return home with the following Two people to help with walking and/or transfers;Two people to help with bathing/dressing/bathroom;Assistance with cooking/housework;Direct supervision/assist for medications management;Direct supervision/assist for financial management;Assist for transportation;Help with stairs or ramp for entrance   Equipment Recommendations  If home:  Possibly hoyer lift; PTAR transport      Recommendations for Other Services       Precautions / Restrictions Precautions Precautions: Fall Required Braces or Orthoses: Knee Immobilizer - Right Knee Immobilizer - Right: On when out of bed or walking Restrictions Weight Bearing Restrictions: Yes RLE Weight Bearing: Touchdown weight bearing     Mobility  Bed Mobility Overal bed mobility: Needs Assistance Bed Mobility: Supine to Sit     Supine to sit: Max assist, +2 for physical assistance, +2 for safety/equipment, HOB elevated     General bed mobility comments: Assist for trunk and bil LEs. Increased time. Cues for technique.    Transfers Overall transfer level: Needs assistance   Transfers: Bed to chair/wheelchair/BSC            Lateral/Scoot Transfers: Total assist (+3 assist) General transfer comment: Lateral scoot transfer with KI on R LE to help with WB restrictions. Total A +3 for scoot to R side to dropped arm recliner. Cues provided but little to no assist from pt-weak with poor ability to weight shift.    Ambulation/Gait                   Stairs             Wheelchair Mobility    Modified Rankin (Stroke Patients Only)       Balance Overall balance assessment: Needs assistance Sitting-balance support: Bilateral upper extremity supported, Feet supported Sitting balance-Leahy Scale: Fair                                      Cognition Arousal/Alertness: Awake/alert Behavior During Therapy: Flat affect Overall Cognitive Status: Within Functional Limits for tasks assessed  General Comments: needed lots of encouragement for all activities, daughters present and helped to convince her to participate        Exercises      General Comments        Pertinent Vitals/Pain Pain Assessment Pain Assessment: Faces Faces Pain Scale: Hurts little more Pain Location:  buttocks Pain Descriptors / Indicators: Aching, Discomfort Pain Intervention(s): Limited activity within patient's tolerance, Monitored during session, Repositioned    Home Living                          Prior Function            PT Goals (current goals can now be found in the care plan section) Progress towards PT goals: Progressing toward goals    Frequency    Min 3X/week      PT Plan Current plan remains appropriate    Co-evaluation              AM-PAC PT "6 Clicks" Mobility   Outcome Measure  Help needed turning from your back to your side while in a flat bed without using bedrails?: A Lot Help needed moving from lying on your back to sitting on the side of a flat bed without using bedrails?: Total Help needed moving to and from a bed to a chair (including a wheelchair)?: Total Help needed standing up from a chair using your arms (e.g., wheelchair or bedside chair)?: Total Help needed to walk in hospital room?: Total Help needed climbing 3-5 steps with a railing? : Total 6 Click Score: 7    End of Session Equipment Utilized During Treatment: Gait belt Activity Tolerance: Patient limited by fatigue Patient left: in chair;with call bell/phone within reach;with family/visitor present Nurse Communication: Need for lift equipment PT Visit Diagnosis: Muscle weakness (generalized) (M62.81) Pain - part of body:  (buttocks)     Time: 1430-1456 PT Time Calculation (min) (ACUTE ONLY): 26 min  Charges:  $Therapeutic Activity: 23-37 mins                        Doreatha Massed, PT Acute Rehabilitation  Office: [phone]

## 2022-04-06 NOTE — Progress Notes (Signed)
Progress Note   Patient: Sarah Wu [id number] DOB: 1950/07/02 DOA: 03/21/2022     15 DOS: the patient was seen and examined on 04/06/2022 at 12:10AM      Brief hospital course: Sarah Wu is a 72 y.o. F with uterine CA, HTN who was transferred from OSH in setting of C diff colitis, new Afib with RVR.   2/7: Admitted to [facility] for syncope/near syncope, found to have abdominal pain, diarrhea 2/8: CT showed pancolitis, started on cefepime, flagyl  2/9: Given ongoing diarrhea, Cdiff toxin and antigen was found to be +, started on Difcid; new onset Afib noted    2/10: Transferred to [place] at family request, Cardiology consulted; amiodarone and heparin started 2/11: CT shows adynamic ileus, also colitis persists 2/15: Orthopedics consulted for loosening knee hardware --> defer to outpatient setting 2/19: Completed 10 days Difcid 2/21: Abd x-ray shows persistent ileus 2/23: GI consulted for ongoing diarrhea, CT shows improved ileus, rectal tube removed 2/26: Stool frequency improving     Assessment and Plan: * C. difficile pancolitis Treated with 10 days fidaxomicin.  WBC and fever resolved. Stool output and fever curve/WBC seem normal.  Ileus (HCC) No pain.  Having gas, BM, no distension.  Cross-sectional imaging 2/23 showed improvement in the ileus, and clinically over the last 2 days this seems resolved. - Supplement magnesium - Advance diet to regular - PT eval     Atrial fibrillation with RVR ([place]) New onset this admission.  CHA2DS2-Vasc 3.  Started anticoagulation this admission, no bleding complications. - Continue apixaban - Continue amiodarone - Supplement mag and K - Continue metoprolol  Hypomagnesemia - Supplement magnesium  Hypokalemia    Protein-calorie malnutrition, severe ([place]) As evidenced by loss of subcutaneous muscle mass and fat, in setting of >10% weight loss in 3 months (93kg Nov 2023, 83kg Feb 2024)  - Consult  dietititan - Continue nutritional supplements  Pressure injury of skin Sacrum stage III, present on admission Left buttock stage 2, not present on admission - Manuka honey, daily dressing changes - Mobilization, limit supine, offloading  Peripheral neuropathy due to chemotherapy (HCC)    Pancytopenia, acquired ([place]) Due to chemotherapy.  Hgb stable.  Platelets and WBC have improved. - Continue iron   Obesity (BMI 30-39.9) BMI 30.18  Uterine cancer (HCC) Metastatic to peritoneum.  On chemo-radiation with Dr. Alvy Bimler.  Pure hypercholesterolemia - Continue Lipitor  Essential hypertension BP soft  - Continue metoprolol          Subjective: Bowel frequency has slowed down, still very loose, patient very limited in her mobility, only sat on the bed briefly once yesterday and once again today.  No fever, no abdominal pain, no distention, no confusion.     Physical Exam: BP 120/77 (BP Location: Right Arm)   Pulse 80   Temp 98.2 F (36.8 C) (Oral)   Resp 20   Ht '5\' 8"'$  (1.727 m)   Wt 90 kg   SpO2 100%   BMI 30.17 kg/m   Elderly adult female, lying in bed, appears chronically ill RRR, no murmurs, mild nonpitting diffuse peripheral edema Respiratory rate normal, lungs clear without rales or wheezes, overall diminished Abdomen soft without tenderness palpation, guarding, rigidity, or ascites Attention normal, affect blunted, judgment insight appear normal, face symmetric, speech fluent, severe generalized weakness    Data Reviewed: Magnesium 1.5 Basic metabolic panel shows hypokalemia, normal renal function CBC shows anemia, no change, white blood cell count normal    Family Communication: Daughter at  the bedside    Disposition: Status is: Inpatient The patient was admitted with C. difficile, new atrial fibrillation  Hospitalization complicated by ileus, which appears to be resolved.  To the best of my knowledge, the above diagnoses best describe the  patient's illness, and all expected diagnostic testing that requires acute inpatient care has been completed.  At this point, the patient is medically ready to transition to an outpatient treatment plan, but because of their diminished functional status from baseline, age, and cancer, it is my medical opinion that discharge home at this time would pose a high risk unnecessary harm.  Safe disposition is being sought, and the patient will be ready to transition to that setting as soon as arranged.          Author: Edwin Dada, MD 04/06/2022 2:40 PM  For on call review [email or web address].

## 2022-04-06 NOTE — Telephone Encounter (Signed)
Patient is still admitted in the hospital.

## 2022-04-06 NOTE — TOC Progression Note (Signed)
Transition of Care [facility]) - Progression Note    Patient Details  Name: Marisia Hanus MRN: [id number] Date of Birth: 1950/08/26  Transition of Care [facility]) CM/SW [place], LCSW Phone Number: 04/06/2022, 3:53 PM  Clinical Narrative:     CSW spoke with pt and family at bedside, to discuss the discharge plan. Pt and family were informed about the barriers to arranging [facility] services. Pt and family expressed their desire to wait until after pt has knee surgery for SNF placement. CSW discussed SNF vs Private Duty Care in depth. Pt's family stated they would contact CSW in the morning with their decision. CSW informed the family about the urgency of making a decision. Pt will need insurance authorization done before she is d/c to SNF facility if they choose this option. TOC to follow.         Expected Discharge Plan and Services                                               Social Determinants of Health (SDOH) Interventions SDOH Screenings   Food Insecurity: No Food Insecurity (03/23/2022)  Housing: Low Risk  (03/23/2022)  Transportation Needs: No Transportation Needs (03/23/2022)  Utilities: Not At Risk (03/23/2022)  Tobacco Use: Low Risk  (03/23/2022)    Readmission Risk Interventions     No data to display

## 2022-04-06 NOTE — TOC Progression Note (Addendum)
Transition of Care [facility]) - Progression Note    Patient Details  Name: Sarah Wu MRN: [id number] Date of Birth: 12/24/1950  Transition of Care [facility]) CM/SW [place], LCSW Phone Number: 04/06/2022, 12:49 PM  Clinical Narrative:    CSW spoke with pt's daughter Ileene Rubens ([phone]), and Shirlean Mylar to inform them of the difficulties of arranging an [facility] agency. CSW informed pt's family still waiting on a response from the last [facility] agency. Pt's family requested a list of agencies that [facility] reached out to. CSW has emailed this list to pt's daughter Ileene Rubens. Pt's family has agreed to reconsider SNF placement if the last [facility] agency declines. Pt's family inquired if they could pay OOP for [facility] services. CSW suggested family contact the [facility] agencies to get rates if any. CSW will follow up with the family once a response from [facility] is received. TOC to follow.   Adden 2:00pm  CSW received call from [place] home health they have declined pt due to low staffing. CSW received a call from pt's daughter Inda Merlin ,who requested CSW to reach out again to [facility] services, CSW reached out to [facility] they have declined due to low staffing.   TOC has reached out to   [facility]: out of service area [place]- declined limited staffing  [facility]: declined on the 2/20, declined on 2/26. [facility]- out of service area [facility]- out of service area [employer] of service area [place] ([place])- declined. [place]- declined  [facility]- declined [place]- out-of-network  CSW called pt's daughter to and left VM about no [facility] agency available for pt and requested a return call to discuss SNF placement. Awaiting return call. TOC to follow.     Expected Discharge Plan and Service TBD   Social Determinants of Health (SDOH) Interventions SDOH Screenings   Food Insecurity: No Food Insecurity (03/23/2022)  Housing: Low Risk  (03/23/2022)   Transportation Needs: No Transportation Needs (03/23/2022)  Utilities: Not At Risk (03/23/2022)  Tobacco Use: Low Risk  (03/23/2022)    Readmission Risk Interventions     No data to display

## 2022-04-07 ENCOUNTER — Telehealth: Payer: Self-pay | Admitting: Internal Medicine

## 2022-04-07 LAB — BASIC METABOLIC PANEL
Anion gap: 9 (ref 5–15)
BUN: <5 mg/dL — ABNORMAL LOW (ref 8–23)
CO2: 26 mmol/L (ref 22–32)
Calcium: 8 mg/dL — ABNORMAL LOW (ref 8.9–10.3)
Chloride: 100 mmol/L (ref 98–111)
Creatinine, Ser: 0.44 mg/dL (ref 0.44–1.00)
GFR, Estimated: >60 mL/min (ref >60)
Glucose, Bld: 77 mg/dL (ref 70–99)
Potassium: 4.1 mmol/L (ref 3.5–5.1)
Sodium: 135 mmol/L (ref 135–145)

## 2022-04-07 LAB — MAGNESIUM: Magnesium: 1.6 mg/dL — ABNORMAL LOW (ref 1.7–2.4)

## 2022-04-07 MED ORDER — MAGNESIUM SULFATE 4 GM/100ML IV SOLN
4.0000 g | Freq: Once | INTRAVENOUS | Status: AC
  Administered 2022-04-07: 4 g via INTRAVENOUS
  Filled 2022-04-07: qty 100

## 2022-04-07 NOTE — Progress Notes (Signed)
Occupational Therapy Treatment Patient Details Name: Sarah Wu MRN: [id number] DOB: 11-28-1950 Today's Date: 04/07/2022   History of present illness Patient is 72 y.o. female who was transferred [facility] from [facility] where she was treated for 2 days due to C. difficile colitis, electrolyte abnormalities, sinus tachycardia with QT prolongation that has now converted into atrial fibrillation with RVR. Imaging (+) for R TK femoral component loosening.  PMH significant for prediabetes, osteoarthritis, fibroid tumor, HLD, HTN, postmenopausal bleeding, peripheral neuropathy, stage IV uterine cancer with pancytopenia.   OT comments  Patient progressing minimally but was received in recliner today rather than bed with use of [place] lift by Nursing.  Pt worked on a stand attempts to [place] but was unable to clear her hips and stated "no, no" as coming up. RLE looks as if was getting pressure from knee guard on Stedy with KI in place.  Readjusted Stedy but pt did not want to continue attempts.  Rest of session spent educating pt's family on DME recommendations for home, expectations for [facility] OT per their questions and on pt's HEP with yellow T-band and her own cane.   Patient remains limited by TDWB and KI to RLE, profound generalized weakness, anxiety and cognitive deficits with self-limiting behaviors, and decreased activity tolerance along with deficits noted below. Pt continues to demonstrate fair/guarded rehab potential and would benefit from continued skilled OT to increase safety and independence with ADLs and functional transfers to allow pt to return home safely and reduce caregiver burden and fall risk.    Recommendations for follow up therapy are one component of a multi-disciplinary discharge planning process, led by the attending physician.  Recommendations may be updated based on patient status, additional functional criteria and insurance authorization.    Follow Up  Recommendations  Skilled nursing-short term rehab (<3 hours/day) (Daughters report they plan to take her home with [facility] PT and OT)     Assistance Recommended at Discharge    Patient can return home with the following  Two people to help with walking and/or transfers;A lot of help with bathing/dressing/bathroom;Assistance with cooking/housework;Assist for transportation;Help with stairs or ramp for entrance;A lot of help with walking and/or transfers   Equipment Recommendations  Other (comment)    Recommendations for Other Services      Precautions / Restrictions Precautions Precautions: Fall Precaution Comments: R LE TDWB with KI Required Braces or Orthoses: Knee Immobilizer - Right Knee Immobilizer - Right: On when out of bed or walking Restrictions Weight Bearing Restrictions: Yes RLE Weight Bearing: Touchdown weight bearing       Mobility Bed Mobility                    Transfers Overall transfer level: Needs assistance   Transfers: Sit to/from Stand Sit to Stand:  (Unable to pull up and clear hips from bed with use of Stedy, KI  to RLE and TDWB.)           General transfer comment: Pt worked on anterior and posterior scooting in recliner. Required cues for motor planning, weight shifting. Able to scoot a few inches in each direction and then too fatigued to continue, requiring Total Assist of 2 people to complete posteiror scoot.     Balance   Sitting-balance support: Feet supported, Bilateral upper extremity supported Sitting balance-Leahy Scale: Fair Sitting balance - Comments: prefers to rely on BUEs for balance. Poor tolerance to unsupported sitting at edge of recliner. Keeps bringing trunk posteriorly to back  of chair.                                   ADL either performed or assessed with clinical judgement   ADL Overall ADL's : Needs assistance/impaired                     Lower Body Dressing: Total  assistance;Sitting/lateral leans Lower Body Dressing Details (indicate cue type and reason): Chair level Toilet Transfer: Total assistance Toilet Transfer Details (indicate cue type and reason): Pt's daughters reported plan to take pt home tomorrow.  They were educated on possibilities for progressing tyoileting to a Heavy duty drop arm 3-in-1 BSC with a sliding board when they are working with [facility] PT and OT. Toileting- Clothing Manipulation and Hygiene: Total assistance;Bed level Toileting - Clothing Manipulation Details (indicate cue type and reason): Unable to transfer to [facility] yet. Has pure wick in place            Extremity/Trunk Assessment Upper Extremity Assessment Upper Extremity Assessment: LUE deficits/detail;RUE deficits/detail;Generalized weakness RUE Deficits / Details: Very limited shoulder ROM with apparent OA LUE Deficits / Details: Very limited shoulder ROM with apparent OA            Vision   Vision Assessment?: No apparent visual deficits   Perception     Praxis      Cognition Arousal/Alertness: Awake/alert Behavior During Therapy: Flat affect, Anxious Overall Cognitive Status: Within Functional Limits for tasks assessed                                 General Comments: needed lots of encouragement for all activities, self-limiting due to fear of falling.  Cognitive impairments of forgetting instructions after being shown and told. Motivation is questionable.        Exercises Other Exercises Other Exercises: While in recliner with back supported as pt too fatigued to engage core, pt performed 10 reps with yellow theraband straight arm chest pull. Then 5 reps straight arm raise each UE, 10 reps bicep curls. Pt then worked on hold cane as a dowel rod for BUE overhead shoulder flexion with difficulty raising arms more than ~60-70 degees. Performed 10 reps of this. Atempted [employer] but pt unable to continue due to [place].    Shoulder  Instructions       General Comments      Pertinent Vitals/ Pain       Pain Assessment Pain Assessment: No/denies pain  Home Living                                          Prior Functioning/Environment              Frequency  Min 2X/week        Progress Toward Goals  OT Goals(current goals can now be found in the care plan section)  Progress towards OT goals: Progressing toward goals  Acute Rehab OT Goals Patient Stated Goal: Per daughters: For pt to get up to chair. Pt in chair today. OT Goal Formulation: With family Time For Goal Achievement: 04/09/22 Potential to Achieve Goals: Fair ADL Goals Pt Will Perform Grooming: sitting;with set-up (progressing to good sitting balance at EOB) Pt Will Perform Upper Body Bathing: with  supervision;with set-up;sitting Pt Will Transfer to Toilet: with mod assist;with +2 assist;squat pivot transfer;with transfer board (Using LRAD) Pt/caregiver will Perform Home Exercise Program: Increased strength;Both right and left upper extremity;With Supervision  Plan Discharge plan remains appropriate    Co-evaluation                 AM-PAC OT "6 Clicks" Daily Activity     Outcome Measure   Help from another person eating meals?: A Little Help from another person taking care of personal grooming?: A Little Help from another person toileting, which includes using toliet, bedpan, or urinal?: Total Help from another person bathing (including washing, rinsing, drying)?: A Lot Help from another person to put on and taking off regular upper body clothing?: A Lot Help from another person to put on and taking off regular lower body clothing?: Total 6 Click Score: 12    End of Session Equipment Utilized During Treatment: Gait belt;Right knee immobilizer (Tried Stedy)  OT Visit Diagnosis: Muscle weakness (generalized) (M62.81)   Activity Tolerance Patient limited by pain;Other (comment) (Self-limiting)   Patient  Left in chair;with call bell/phone within reach;with family/visitor present   Nurse Communication Other (comment) (MD to room. Recommneded mechanical lift, heavy duty Drop arm 3-in-1 BSC, Heavy duty sliding board.)        Time: [id number] OT Time Calculation (min): 43 min  Charges: OT General Charges $OT Visit: 1 Visit OT Treatments $Therapeutic Activity: 23-37 mins $Therapeutic Exercise: 8-22 mins  Anderson Malta, OT Acute Rehab Services Office: [phone] 04/07/2022   Julien Girt 04/07/2022, 1:05 PM

## 2022-04-07 NOTE — Progress Notes (Signed)
This is documented brief visit.  No charge applies to today's visit I will contact the patient's family in 2 to 3 weeks to set up outpatient follow-up I have addressed all of their questions Will sign off

## 2022-04-07 NOTE — Telephone Encounter (Signed)
See above message

## 2022-04-07 NOTE — TOC Progression Note (Addendum)
Transition of Care [facility]) - Progression Note    Patient Details  Name: Sarah Wu MRN: [id number] Date of Birth: [birth year]  Transition of Care [facility]) CM/SW [place], LCSW Phone Number: 04/07/2022, 11:08 AM  Clinical Narrative:    CSW received a call from pt's daughter Inda Merlin who reported they want to take pt home and declined SNF placement. Pt's family has arranged for someone to be in the home 24hrs. Pt's daughter reported pt's youngest brother will be with her during the day, the oldest brother at night, and family friends will help with bathing, wound care, and cooking food.   Per prior notes TOC is NOT able to arrange  [facility] services for pt. CSW has spoken with pt and family and they have verbalized understanding of this.   Pt's family has requested EMS transport, CSW contacted [facility] to get an estimate, pt would need to but down 500 and it will be 17.88 per mile after 50 miles. CSW discussed this with pt and her family. Pt's family has decided to provide transport as long as someone can help her get into the car.   ADDEN 1:14pm CSW spoke with [facility] to confirm pt has received wheelchair, 3 in 1, and hospital bed. Will need orders for [facility] lift and sliding board. MD made aware. TOC to follow      Expected Discharge Plan and Services                                               Social Determinants of Health (SDOH) Interventions SDOH Screenings   Food Insecurity: No Food Insecurity (03/23/2022)  Housing: Low Risk  (03/23/2022)  Transportation Needs: No Transportation Needs (03/23/2022)  Utilities: Not At Risk (03/23/2022)  Tobacco Use: Low Risk  (03/23/2022)    Readmission Risk Interventions     No data to display

## 2022-04-07 NOTE — Progress Notes (Signed)
Nutrition Follow-up  DOCUMENTATION CODES:   Non-severe (moderate) malnutrition in context of chronic illness  INTERVENTION:  - Regular diet.  - Magic cup BID with meals, each supplement provides 290 kcal and 9 grams of protein - Encourage intake at all meals and of supplements.  - Daily multivitamin and '500mg'$  vitamin C to support wound healing.  - Monitor weight trends.   NUTRITION DIAGNOSIS:   Moderate Malnutrition related to chronic illness as evidenced by mild fat depletion, mild muscle depletion. *Ongoing  GOAL:   Patient will meet greater than or equal to 90% of their needs *progressing   MONITOR:   PO intake, Supplement acceptance, Diet advancement, Weight trends  REASON FOR ASSESSMENT:   Malnutrition Screening Tool    ASSESSMENT:   72 y.o. female with medical history significant of prediabetes, osteoarthritis, fibroid tumor, HLD, HTN, peripheral neuropathy, stage IV uterine cancer with pancytopenia who was transferred to [facility] from [facility] where she has been treated for the past 2 days due to C. difficile colitis, electrolyte abnormalities, sinus tachycardia with QT prolongation.   Patient had 0% of meals 2/23-2/25 while NPO and on only a liquid diet but since diet advanced 2/26 she has had 20-100% of meals. Had 100% of breakfast this AM. Patient is due for a new weight as she has not had a weight in just under 2 weeks. She remains ready for discharge, family have decided to take patient home.   Medications reviewed and include: '500mg'$  vitamin C, iron, MVI, Fibercon  Labs reviewed:  Magnesium 1.6   Diet Order:   Diet Order             Diet regular Room service appropriate? Yes; Fluid consistency: Thin  Diet effective now                   EDUCATION NEEDS:  Education needs have been addressed  Skin:  Skin Assessment: Reviewed RN Assessment Skin Integrity Issues:: Stage III Stage III: Sacrum  Last BM:  2/25  Height:  Ht  Readings from Last 1 Encounters:  03/23/22 '5\' 8"'$  (1.727 m)   Weight:  Wt Readings from Last 1 Encounters:  03/26/22 90 kg    BMI:  Body mass index is 30.17 kg/m.  Estimated Nutritional Needs:  Kcal:  [id number] kcals Protein:  100-125 grams Fluid:  >/= 2.3L    Samson Frederic RD, LDN For contact information, refer to [facility].

## 2022-04-07 NOTE — Telephone Encounter (Signed)
Patient's daughter asking if patient needs to come into the office 04/10/2022 for appointment, as patient is till currently admitted. Says patient has difficulty getting around, would like to know if this in-office appointment is necessary.

## 2022-04-07 NOTE — Telephone Encounter (Signed)
No she can come in april

## 2022-04-07 NOTE — Progress Notes (Signed)
  Progress Note   Patient: Sarah Wu [id number] DOB: [birth year] DOA: 03/21/2022     16 DOS: the patient was seen and examined on 04/07/2022 at 9:30AM      Brief hospital course: Mrs. Oni is a 72 y.o. F with uterine CA, HTN who was transferred from OSH in setting of C diff colitis, new Afib with RVR.       Assessment and Plan: * C. difficile pancolitis Resolved  Ileus ([place]) Resolved     Atrial fibrillation with RVR (HCC) - Continue apixaban - Continue amiodarone   Hypomagnesemia - IV magnesium         Subjective: Bowel frequency improved, no abdominal pain, no fever.     Physical Exam: BP 107/73 (BP Location: Right Arm)   Pulse 85   Temp 98.4 F (36.9 C) (Oral)   Resp 16   Ht 5' 8"$  (1.727 m)   Wt 90 kg   SpO2 99%   BMI 30.17 kg/m   Elderly adult female, lying in bed, appears chronically ill RRR, no murmurs, mild nonpitting diffuse peripheral edema Respiratory rate normal, lungs clear without rales or wheezes, overall diminished Abdomen soft without tenderness palpation, guarding, rigidity, or ascites Attention normal, affect blunted, judgment insight appear normal, face symmetric, speech fluent, severe generalized weakness    Data Reviewed: Magnesium still low.   Metaboilc panel otherwise normal   Family Communication: Daughters at the bedside    Disposition: Status is: Inpatient    Safe disposition is being sought, and the patient will be ready to transition to that setting as soon as arranged.          Author: Edwin Dada, MD 04/07/2022 4:58 PM  For on call review [email or web address].

## 2022-04-08 ENCOUNTER — Other Ambulatory Visit (HOSPITAL_COMMUNITY): Payer: Self-pay

## 2022-04-08 LAB — MAGNESIUM: Magnesium: 2 mg/dL (ref 1.7–2.4)

## 2022-04-08 MED ORDER — MEDIHONEY WOUND/BURN DRESSING EX PSTE
1.0000 | PASTE | Freq: Every day | CUTANEOUS | 3 refills | Status: DC
  Filled 2022-04-08 – 2022-04-22 (×2): qty 44, 44d supply, fill #0

## 2022-04-08 MED ORDER — APIXABAN 5 MG PO TABS
5.0000 mg | ORAL_TABLET | Freq: Two times a day (BID) | ORAL | 3 refills | Status: DC
  Filled 2022-04-08: qty 60, 30d supply, fill #0
  Filled 2022-09-04: qty 60, 30d supply, fill #1

## 2022-04-08 MED ORDER — AMIODARONE HCL 200 MG PO TABS
200.0000 mg | ORAL_TABLET | Freq: Every day | ORAL | 3 refills | Status: DC
  Filled 2022-04-08: qty 30, 30d supply, fill #0
  Filled 2022-09-04: qty 30, 30d supply, fill #1

## 2022-04-08 MED ORDER — HEPARIN SOD (PORK) LOCK FLUSH 100 UNIT/ML IV SOLN
500.0000 [IU] | Freq: Once | INTRAVENOUS | Status: AC
  Administered 2022-04-08: 500 [IU] via INTRAVENOUS
  Filled 2022-04-08: qty 5

## 2022-04-08 NOTE — Progress Notes (Signed)
Patient education completed for medications. Patient is dressed, ride is here, ready to be discharged.

## 2022-04-08 NOTE — Discharge Summary (Signed)
Physician Discharge Summary   Patient: Sarah Wu MRN: [id number] DOB: Jan 06, 1951  Admit date:     03/21/2022  Discharge date: 04/08/22  Discharge Physician: Edwin Dada   PCP: Levin Erp, DO     Recommendations at discharge:  Follow up with [facility] for right knee revision Drs. Kathaleen Bury and [employer]:  Please have staff assist family to fax records from [place] and arrange follow up appointment and preoperative work up   Dr. Shellia Carwin: Please comment on preoperative cardiac clearance at upcoming appointment and fax to [facility]  Follow up with PCP Dr. Luz Brazen in 1 week Dr. Luz Brazen: Please obtain CBC and BMP in 1 week     Discharge Diagnoses: Principal Problem:   C. difficile pancolitis Active Problems:   Atrial fibrillation with RVR ([place])   Ileus ([place])   Essential hypertension   Prediabetes   Pure hypercholesterolemia   Uterine cancer ([place])   Obesity (BMI 30-39.9)   Pancytopenia, acquired ([place])   Peripheral neuropathy due to chemotherapy ([place])   Pressure injury of skin   Protein-calorie malnutrition, severe ([place])   Hypokalemia   Hypomagnesemia      Hospital Course: Sarah Wu is a 72 y.o. F with uterine CA, HTN who was transferred from OSH in setting of C diff colitis, new Afib with RVR.   2/7: Admitted to [facility] for syncope/near syncope, found to have abdominal pain, diarrhea 2/8: CT showed pancolitis, started on cefepime, flagyl  2/9: Given ongoing diarrhea, Cdiff toxin and antigen was found to be +, started on Difcid; new onset Afib noted    2/10: Transferred to [place] at family request, Cardiology consulted; amiodarone and heparin started 2/11: CT shows adynamic ileus, also colitis persists 2/15: Orthopedics consulted for loosening knee hardware --> defer to outpatient setting 2/19: Completed 10 days Difcid 2/21: Abd x-ray shows persistent ileus 2/23: GI consulted for ongoing diarrhea, CT shows  improved ileus, rectal tube removed 2/24-2/26: Stool frequency normalized, electrolytes normalized     * C. difficile pancolitis Treated with 10 days fidaxomicin.  WBC and fever resolved. Stool output and fever curve/WBC seem normal.  Ileus (HCC) Developed distension, discomfort, obstipation with resolution of Cdiff.  X-ray showed dilated colon.    Treated conservatively, electrolytes repleted and clinically resolved.  Advanced to regular diet last two days and no problems.     Atrial fibrillation with RVR ([place]) New onset this admission.  CHA2DS2-Vasc 3.  Started anticoagulation and amiodarone this admission, no bleding complications.    Pressure injury of skin Sacrum stage III, present on admission Left buttock stage 2, not present on admission - Manuka honey, daily dressing changes - Mobilization, limit supine, offloading   Pancytopenia, acquired ([place]) Due to chemotherapy.  Hgb stable.  Platelets and WBC have improved.   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  On chemo-radiation with Dr. Alvy Bimler.            The [place] Controlled Substances Registry was reviewed for this patient prior to discharge.   Consultants:  Orthopedics GI, [facility]iology  Procedures performed:   Echocardiogram CT knee CT abdomen and pelvis  Disposition: Home health agencies sought, refused due to to insurance  Diet recommendation: Regular   DISCHARGE MEDICATION: Allergies as of 04/08/2022       Reactions   Hydrocodone-acetaminophen Swelling   Swells stomach   Oxycodone-acetaminophen Swelling   Swells stomach        Medication List  STOP taking these medications    amLODipine 5 MG tablet Commonly known as: NORVASC   furosemide 20 MG tablet Commonly known as: LASIX   polyethylene glycol 17 g packet Commonly known as: MIRALAX / GLYCOLAX       TAKE these medications    allopurinol 300 MG  tablet Commonly known as: ZYLOPRIM Take 300 mg by mouth daily.   amiodarone 200 MG tablet Commonly known as: PACERONE Take 1 tablet (200 mg total) by mouth daily. Start taking on: April 09, 2022   atenolol 50 MG tablet Commonly known as: TENORMIN Take 50 mg by mouth daily.   atorvastatin 10 MG tablet Commonly known as: LIPITOR Take 10 mg by mouth at bedtime.   Biotin 5000 5 MG Caps Generic drug: Biotin Take 5 mg by mouth daily.   diphenoxylate-atropine 2.5-0.025 MG tablet Commonly known as: LOMOTIL Take 2 tablets by mouth 4 (four) times daily as needed for diarrhea or loose stools.   Eliquis 5 MG Tabs tablet Generic drug: apixaban Take 1 tablet (5 mg total) by mouth 2 (two) times daily.   ferrous sulfate 325 (65 FE) MG tablet Take 325 mg by mouth daily with breakfast.   leptospermum manuka honey Pste paste Apply 1 Application topically daily. Start taking on: April 09, 2022   Lidocaine 3.5 % Ptch Apply 1 patch topically daily as needed (knee pain).   lidocaine-prilocaine cream Commonly known as: EMLA Apply to affected area once   multivitamin with minerals tablet Take 1 tablet by mouth at bedtime. Centrum   ondansetron 8 MG tablet Commonly known as: Zofran Take 1 tablet by mouth every 8 hours as needed for nausea or vomiting. Start on the third day after chemotherapy.   OVER THE COUNTER MEDICATION Take 1 tablet by mouth daily. [facility] and brain support   prochlorperazine 10 MG tablet Commonly known as: COMPAZINE Take 1 tablet (10 mg total) by mouth every 6 (six) hours as needed for nausea or vomiting.               Discharge Care Instructions  (From admission, onward)           Start     Ordered   04/08/22 0000  Discharge wound care:       Comments: Cleanse with warm soap and water, pat dry. Apply MediHoney, top with dry gauze 2x2 and secure with silicone foam.  Turn side to side, minimize time in the supine position.    04/08/22 [id number]            Follow-up Information     Custovic, Collene Mares, DO. Go in 1 week(s).   Specialty: Cardiology Contact information: [place] [address] [phone]         Armond Hang, MD. Call.   Specialty: Orthopedic Surgery Why: Call to ask about follow up for knee surgery Contact information: [address], [address] [place] [address] [phone]         Levin Erp, DO. Schedule an appointment as soon as possible for a visit in 1 week(s).   Specialty: Family Medicine Contact information: [address] [place] [address] [phone]                 Discharge Instructions     Discharge instructions   Complete by: As directed    **IMPORTANT DISCHARGE INSTRUCTIONS**   From Dr. Loleta Books: You were admitted for weakness and nearly passing out In the hospital, it  was found that this was from Clostridium difficile colitis ("C diff infection", a form of resistant colon infection) and also new A fib (atrial fibrillation, an abnormal heart rhythm)   The C diff was treated with antibiotics, you completed the course, and this should be resolved. However, in the unlikely even you develop fever, abdominal pain, OR start to have MORE THAN 6 watery bowel movements per day, call Dr. Luz Brazen immediately   The new Afib was controlled with amiodarone, a medicine to slow and regulate hte heart rate You were also started on a blood thinner apixaban/Eliquis to reduce the risk/severity of Afib related stroke   These are your two new medicines: Amiodarone 200 mg once daily in the morning Apixaban/Eliquis 5 mg twice daily (once in morning, once at night)  Continue your other home medicines   For the knee: Call [facility] (Dr. Kathaleen Bury, listed below) Explain that you need a revision, were seen by Dr. Kathaleen Bury in the hospital, and ask them about how to fax records from your previous knee surgeries in  [place]   For the A fib: Go see Dr. Shellia Carwin at your upcoming appointment I have asked her to fax your note to [facility], so that they have preop Cardiac clearance already done   Discharge wound care:   Complete by: As directed    Cleanse with warm soap and water, pat dry. Apply MediHoney, top with dry gauze 2x2 and secure with silicone foam.  Turn side to side, minimize time in the supine position.   Increase activity slowly   Complete by: As directed        Discharge Exam: Filed Weights   03/23/22 2016 03/26/22 1113  Weight: 82.2 kg 90 kg    General: Pt is alert, awake, not in acute distress, appears tired Cardiovascular: RRR, nl S1-S2, no murmurs appreciated.   No LE edema.   Respiratory: Normal respiratory rate and rhythm.  CTAB without rales or wheezes. Abdominal: Abdomen soft and non-tender.  No distension or HSM.   Neuro/Psych: Strength symmetric in upper and lower extremities.  Judgment and insight appear normal.  Oriented to situation. Severe generalized weakness noted.   Condition at discharge: stable  The results of significant diagnostics from this hospitalization (including imaging, microbiology, ancillary and laboratory) are listed below for reference.   Imaging Studies: CT ABDOMEN PELVIS W CONTRAST  Result Date: 04/03/2022 CLINICAL DATA:  Bowel obstruction suspected. History of C difficile colitis. EXAM: CT ABDOMEN AND PELVIS WITH CONTRAST TECHNIQUE: Multidetector CT imaging of the abdomen and pelvis was performed using the standard protocol following bolus administration of intravenous contrast. RADIATION DOSE REDUCTION: This exam was performed according to the departmental dose-optimization program which includes automated exposure control, adjustment of the mA and/or kV according to patient size and/or use of iterative reconstruction technique. CONTRAST:  188m OMNIPAQUE IOHEXOL 300 MG/ML  SOLN COMPARISON:  CT abdomen and pelvis 03/22/2022, 03/19/2022 and  11/21/2021 FINDINGS: Lower chest: 6 mm nodule in the medial right lower lobe on image 11/4 minimally changed since 11/21/2021. Compressive atelectasis at both lung bases with trace bilateral pleural effusions. Stable tiny nodules in the right middle lobe on image 9/4. Central line tip near the superior cavoatrial junction. Small nodule anterior to the heart measures 9 mm on image 8/2 and minimally changed since 11/21/2021. Hepatobiliary: Normal appearance of the liver. Limited evaluation of the gallbladder due to external compression from the adjacent colon. No significant biliary dilatation. Pancreas: Unremarkable. No pancreatic ductal dilatation or surrounding inflammatory  changes. Spleen: Normal in size without focal abnormality. Adrenals/Urinary Tract: Normal appearance of the adrenal glands. Probable small cyst in the posterior right kidney that does not require dedicated follow-up. No hydronephrosis and no suspicious renal lesions. Normal appearance of the urinary bladder. Probable small parapelvic cyst in left kidney lower pole region. Stomach/Bowel: Stomach is decompressed. Probable lipoma near the duodenal bulb region. Duodenum is decompressed. Again noted is gaseous distension throughout the colon. Cecum is located at the anterior midline and measures up to 7.7 cm in diameter and previously measured 9.5 cm. Oral contrast in a normal appearing appendix. Mid transverse colon measures up to 6.0 cm in diameter and previously measured 7.3 cm. Mild wall thickening in the sigmoid colon probably represents underlying diverticular changes. Mild distention of the rectum with fluid. There is a rectal tube with inflated balloon. No evidence for a mass lesion. No focal bowel inflammation. Vascular/Lymphatic: Small retroperitoneal lymph nodes have decreased in size since 11/21/2021. Index lymph node anterior to the IVC on image 53/2 measures 5 mm in the short axis and previously measured 8 mm. Nodular tissue adjacent to  the suprarenal IVC on image 25/2 measures 11 mm in the short axis and previously measured 17 mm in the short axis. No suspicious lymph node enlargement. Minimal atherosclerotic disease in the abdominal aorta without aneurysm. 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 This thrombus actually appears to be slightly smaller and causing less vein distension compared to the comparison exam on 03/19/2022. The left ovarian vein thrombus is best seen on image 41/5. Reproductive: History of total hysterectomy with bilateral salpingectomy oophorectomy. Small amount of low-density material in the left adnexal region likely represents postoperative changes. Other: Diffuse subcutaneous edema. Persistent presacral edema. Negative for free air. Small umbilical hernia containing fat. Musculoskeletal: Again noted is a sclerotic lesion along the inferior endplate L1. This lesion has not significantly changed since 11/21/2021 but new since 09/01/2021. Disc space narrowing and endplate changes at 624THL. Anterolisthesis of L3 on L4. Degenerative facet disease in lumbar spine. No acute bone abnormality. Multilevel disc bulges in the lumbar spine. IMPRESSION: 1. Persistent gaseous distension throughout the colon. The colonic gaseous distension has slightly decreased since 03/22/2022 as described. There is now a rectal tube with an inflated balloon in the rectum. 2. No significant wall thickening involving the colon. No evidence for an obstructive lesion. 3. Thrombus involving the left ovarian vein. Thrombus appears to be slightly smaller and causing less vein distension compared to recent comparison exams. 4. Atelectasis at both lung bases with trace bilateral pleural effusions. 5. Stable sclerotic lesion involving L1 vertebral body. This is suggestive for a bone metastasis and may represent treated disease based on the sclerosis. Electronically Signed   By: Markus Daft M.D.   On: 04/03/2022 16:50   DG Abd 1  View  Result Date: 04/03/2022 CLINICAL DATA:  Abdominal distention EXAM: ABDOMEN - 1 VIEW COMPARISON:  Abdominal radiograph dated 04/01/2022 FINDINGS: Interval increase in marked dilation of gas-filled large bowel. Rectal tube in-situ. No acute osseous abnormality. IMPRESSION: Interval increase in marked dilation of gas-filled large bowel. Electronically Signed   By: Darrin Nipper M.D.   On: 04/03/2022 08:11   DG Abd 1 View  Result Date: 04/01/2022 CLINICAL DATA:  [id number] Abdominal distention [id number] EXAM: ABDOMEN - 1 VIEW COMPARISON:  03/22/2022 FINDINGS: Persistent marked gaseous distension of the colon as well as several prominent loops of small bowel, pattern very similar to the previous study. No  gross free intraperitoneal air. Streaky bibasilar opacities. IMPRESSION: Persistent marked gaseous distension of the colon as well as several prominent loops of small bowel, pattern very similar to the previous study and favors ileus. Electronically Signed   By: Davina Poke D.O.   On: 04/01/2022 08:37   CT KNEE RIGHT WO CONTRAST  Result Date: 03/25/2022 CLINICAL DATA:  Loose body, knee. Probable loosening and subsidence of the femoral component. EXAM: CT OF THE RIGHT KNEE WITHOUT CONTRAST TECHNIQUE: Multidetector CT imaging of the right knee was performed according to the standard protocol. Multiplanar CT image reconstructions were also generated. RADIATION DOSE REDUCTION: This exam was performed according to the departmental dose-optimization program which includes automated exposure control, adjustment of the mA and/or kV according to patient size and/or use of iterative reconstruction technique. COMPARISON:  Radiographs same date.  No other comparison studies. FINDINGS: Bones/Joint/Cartilage Status post right total knee arthroplasty (bilateral total knee arthroplasty on the scout image). As seen on the earlier radiographs, there is an abnormal orientation of the femoral component relative to the distal femur.  There is some lucency between the femur and the femoral component which could indicate loosening. The femoral component also appears posterolaterally displaced relative to the distal femoral metaphysis. On the coronal images, there is cortical step-off along the lateral femoral epicondyle (see coronal image 68/6). These findings may be chronic and related to an old partially healed fracture. Bone detail is limited by artifact from the arthroplasty, and an acute fracture cannot be completely excluded in the appropriate clinical context (such as recent fall or acute pain). The tibial component appears intact without loosening. The proximal tibia, fibula and patella appear intact. The bones are diffusely demineralized. No significant joint effusion identified. Ligaments Suboptimally assessed by CT. Muscles and Tendons Generalized muscular atrophy. The extensor mechanism appears intact, although the patellar tendon is partly obscured by artifact. No evidence of intramuscular hematoma or other focal fluid collection. Soft tissues Generalized subcutaneous edema without evidence of focal fluid collection, unexpected foreign body or soft tissue emphysema. IMPRESSION: 1. Abnormal orientation of the femoral component of the right total knee arthroplasty as seen on earlier radiographs. The femoral component appears posterolaterally displaced relative to the distal femoral metaphysis and there is possible loosening. 2. Cortical step-off along the lateral femoral epicondyle may be chronic and related to an old partially healed fracture. Bone detail is limited by artifact from the arthroplasty, and an acute fracture cannot be completely excluded in the appropriate clinical context. 3. The tibial component appears intact without loosening. 4. Generalized muscular atrophy. 5. Generalized subcutaneous edema without evidence of focal fluid collection, unexpected foreign body or soft tissue emphysema. Electronically Signed   By:  Richardean Sale M.D.   On: 03/25/2022 18:15   DG Knee Complete 4 Views Right  Result Date: 03/25/2022 CLINICAL DATA:  Previous knee replacement with pain EXAM: RIGHT KNEE - COMPLETE 4+ VIEW COMPARISON:  None Available. FINDINGS: Patient has had previous total knee arthroplasty. No abnormality seen relating to the tibial component. Probable loosening and subsidence of the femoral component, possibly with intra-articular fragmentation. Patellar component is not well seen but may also be abnormal. Joint effusion is present. IMPRESSION: Previous total knee arthroplasty. Probable loosening and subsidence of the femoral component, possibly with intra-articular fragmentation. Patellar component is not well seen but may also be abnormal. Joint effusion. Electronically Signed   By: Nelson Chimes M.D.   On: 03/25/2022 13:55   CT ABDOMEN PELVIS W CONTRAST  Result Date: 03/22/2022 CLINICAL  DATA:  Acute non localized abdominal pain. Prior uterine carcinoma. * Tracking Code: BO * EXAM: CT ABDOMEN AND PELVIS WITH CONTRAST TECHNIQUE: Multidetector CT imaging of the abdomen and pelvis was performed using the standard protocol following bolus administration of intravenous contrast. RADIATION DOSE REDUCTION: This exam was performed according to the departmental dose-optimization program which includes automated exposure control, adjustment of the mA and/or kV according to patient size and/or use of iterative reconstruction technique. CONTRAST:  164m OMNIPAQUE IOHEXOL 300 MG/ML  SOLN COMPARISON:  11/21/2021 FINDINGS: Lower Chest: New bilateral lower lobe atelectasis or consolidation and small bilateral pleural effusions. Hepatobiliary: No hepatic masses identified. Gallstones are seen, however there is no evidence of cholecystitis or biliary dilatation. Pancreas:  No mass or inflammatory changes. Spleen: Within normal limits in size and appearance. Adrenals/Urinary Tract: No suspicious masses identified. No evidence of ureteral  calculi or hydronephrosis. Stomach/Bowel: Diffuse small bowel and colonic dilatation is seen, consistent with ileus. Mild diffuse wall thickening is seen throughout the colon, consistent with diffuse colitis. No evidence of small bowel dilatation or wall thickening. Normal appendix visualized. No evidence of abscess or free fluid. Vascular/Lymphatic: No pathologically enlarged lymph nodes. No acute vascular findings. Reproductive: Prior hysterectomy noted. Adnexal regions are unremarkable in appearance. Other:  None. Musculoskeletal:  No suspicious bone lesions identified. IMPRESSION: Mild diffuse colitis. Adynamic ileus. No evidence of recurrent or metastatic carcinoma. Cholelithiasis. No radiographic evidence of cholecystitis. New bilateral lower lobe atelectasis or consolidation and small bilateral pleural effusions. Electronically Signed   By: JMarlaine HindM.D.   On: 03/22/2022 15:54   DG Abd 1 View  Result Date: 03/22/2022 CLINICAL DATA:  Abdominal distension.  History of C diff. EXAM: ABDOMEN - 1 VIEW COMPARISON:  CT scan 11/21/2021 FINDINGS: Diffuse gaseous distension of small bowel and colon noted with gas visible in the descending colon to the level of the sigmoid segment. Mild gaseous distention of the stomach is associated. IMPRESSION: Diffuse gaseous distension of small bowel and colon. Imaging features more suggestive of ileus than obstruction at this time. Electronically Signed   By: EMisty StanleyM.D.   On: 03/22/2022 10:29   ECHOCARDIOGRAM COMPLETE  Result Date: 03/22/2022    ECHOCARDIOGRAM REPORT   Patient Name:   MEMONE ONDERDate of Exam: 03/22/2022 Medical Rec #:  [id number]         Height:       68.0 in Accession #:    [id number]        Weight:       185.0 lb Date of Birth:  205-Dec-1952         BSA:          1.977 m Patient Age:    89years           BP:           113/76 mmHg Patient Gender: F                  HR:           106 bpm. Exam Location:  Inpatient Procedure: 2D Echo,  Cardiac Doppler and Color Doppler Indications:    I48.9 Atrial Fibrillation  History:        Patient has no prior history of Echocardiogram examinations.                 C-Diff; uterine cancer; Risk Factors:Hypertension, Dyslipidemia  and Non-Smoker.  Sonographer:    Wilkie Aye RVT RCS Referring Phys: [id number] [place]  Sonographer Comments: No subcostal window, Technically challenging study due to limited acoustic windows, suboptimal parasternal window and suboptimal apical window. Image acquisition challenging due to respiratory motion and exam done with patient in supine position. IMPRESSIONS  1. Left ventricular ejection fraction, by estimation, is 65 to 70%. The left ventricle has normal function. The left ventricle has no regional wall motion abnormalities. There is mild left ventricular hypertrophy. Left ventricular diastolic parameters are consistent with Grade I diastolic dysfunction (impaired relaxation).  2. Right ventricular systolic function is normal. The right ventricular size is normal. Tricuspid regurgitation signal is inadequate for assessing PA pressure.  3. The mitral valve is grossly normal. Trivial mitral valve regurgitation. No evidence of mitral stenosis.  4. The aortic valve is grossly normal. Aortic valve regurgitation is not visualized. No aortic stenosis is present. FINDINGS  Left Ventricle: Left ventricular ejection fraction, by estimation, is 65 to 70%. The left ventricle has normal function. The left ventricle has no regional wall motion abnormalities. The left ventricular internal cavity size was normal in size. There is  mild left ventricular hypertrophy. Left ventricular diastolic parameters are consistent with Grade I diastolic dysfunction (impaired relaxation). Right Ventricle: The right ventricular size is normal. No increase in right ventricular wall thickness. Right ventricular systolic function is normal. Tricuspid regurgitation signal is inadequate for  assessing PA pressure. Left Atrium: Left atrial size was normal in size. Right Atrium: Right atrial size was normal in size. Pericardium: There is no evidence of pericardial effusion. Mitral Valve: The mitral valve is grossly normal. Trivial mitral valve regurgitation. No evidence of mitral valve stenosis. Tricuspid Valve: The tricuspid valve is normal in structure. Tricuspid valve regurgitation is not demonstrated. No evidence of tricuspid stenosis. Aortic Valve: The aortic valve is grossly normal. Aortic valve regurgitation is not visualized. No aortic stenosis is present. Aortic valve mean gradient measures 3.0 mmHg. Aortic valve peak gradient measures 4.9 mmHg. Aortic valve area, by VTI measures 2.92 cm. Pulmonic Valve: The pulmonic valve was normal in structure. Pulmonic valve regurgitation is trivial. No evidence of pulmonic stenosis. Aorta: The aortic root is normal in size and structure. Venous: The inferior vena cava was not well visualized. IAS/Shunts: The interatrial septum was not well visualized.  LEFT VENTRICLE PLAX 2D LVIDd:         2.90 cm   Diastology LVIDs:         2.10 cm   LV e' medial:    7.36 cm/s LV PW:         1.10 cm   LV E/e' medial:  7.2 LV IVS:        1.10 cm   LV e' lateral:   6.48 cm/s LVOT diam:     2.00 cm   LV E/e' lateral: 8.2 LV SV:         38 LV SV Index:   19 LVOT Area:     3.14 cm  RIGHT VENTRICLE RV S prime:     14.50 cm/s TAPSE (M-mode): 1.5 cm LEFT ATRIUM             Index LA diam:        3.10 cm 1.57 cm/m LA Vol (A2C):   18.5 ml 9.36 ml/m LA Vol (A4C):   25.0 ml 12.64 ml/m LA Biplane Vol: 22.5 ml 11.38 ml/m  AORTIC VALVE  PULMONIC VALVE AV Area (Vmax):    2.80 cm     PV Vmax:       1.25 m/s AV Area (Vmean):   2.78 cm     PV Peak grad:  6.2 mmHg AV Area (VTI):     2.92 cm AV Vmax:           111.00 cm/s AV Vmean:          75.700 cm/s AV VTI:            0.130 m AV Peak Grad:      4.9 mmHg AV Mean Grad:      3.0 mmHg LVOT Vmax:         99.00 cm/s LVOT  Vmean:        67.100 cm/s LVOT VTI:          0.121 m LVOT/AV VTI ratio: 0.93  AORTA Ao Root diam: 2.60 cm Ao Asc diam:  2.70 cm MITRAL VALVE MV Area (PHT): 3.46 cm    SHUNTS MV Decel Time: 219 msec    Systemic VTI:  0.12 m MV E velocity: 52.90 cm/s  Systemic Diam: 2.00 cm MV A velocity: 81.00 cm/s MV E/A ratio:  0.65 Cherlynn Kaiser MD Electronically signed by Cherlynn Kaiser MD Signature Date/Time: 03/22/2022/9:42:10 AM    Final     Microbiology: Results for orders placed or performed during the hospital encounter of 03/21/22  MRSA Next Gen by PCR, Nasal     Status: None   Collection Time: 03/21/22  6:04 AM   Specimen: Nasal Mucosa; Nasal Swab  Result Value Ref Range Status   MRSA by PCR Next Gen NOT DETECTED NOT DETECTED Final    Comment: (NOTE) The GeneXpert MRSA Assay (FDA approved for NASAL specimens only), is one component of a comprehensive MRSA colonization surveillance program. It is not intended to diagnose MRSA infection nor to guide or monitor treatment for MRSA infections. Test performance is not FDA approved in patients less than 40 years old. Performed at [facility], [place] [address], [place], [place] [address]   Culture, blood (Routine X 2) w Reflex to ID Panel     Status: None   Collection Time: 03/24/22 12:44 PM   Specimen: BLOOD  Result Value Ref Range Status   Specimen Description   Final    BLOOD BLOOD LEFT ARM Performed at [facility] [address], [place], [place] [address]    Special Requests   Final    BOTTLES DRAWN AEROBIC ONLY Blood Culture adequate volume Performed at [facility] [address], [place], [place] [address]    Culture   Final    NO GROWTH 5 DAYS Performed at [facility], [place] [address] [place], [place] [address]    Report Status 03/29/2022 FINAL  Final  Culture, blood (Routine X 2) w Reflex to ID Panel     Status: None   Collection Time: 03/24/22 12:44 PM   Specimen:  BLOOD  Result Value Ref Range Status   Specimen Description   Final    BLOOD BLOOD LEFT HAND Performed at [facility] [address], [place], [place] [address]    Special Requests   Final    BOTTLES DRAWN AEROBIC ONLY Blood Culture adequate volume Performed at [facility] [address] [place], [place] [address]    Culture   Final    NO GROWTH 5 DAYS Performed at [facility], [place] [address], [place], [place] [address]    Report Status 03/29/2022 FINAL  Final    Labs: CBC: Recent Labs  Lab 04/05/22 0304 04/06/22 0307  WBC 4.5 4.1  HGB 8.7* 8.6*  HCT 28.6* 28.0*  MCV 94.4 95.2  PLT 231 [id number]   Basic Metabolic Panel: Recent Labs  Lab 04/02/22 0356 04/02/22 1058 04/03/22 0400 04/04/22 0325 04/05/22 0304 04/06/22 0307 04/07/22 0429 04/08/22 0248  NA  --    < > 136 134* 135 135 135  --   K  --    < > 3.9 3.4* 3.8 3.2* 4.1  --   CL  --    < > 99 97* 98 99 100  --   CO2  --    < > '25 23 26 26 26  '$ --   GLUCOSE  --    < > 74 66* 78 84 77  --   BUN  --    < > 6* 6* <5* <5* <5*  --   CREATININE  --    < > 0.63 0.53 0.60 0.50 0.44  --   CALCIUM  --    < > 8.3* 8.0* 8.1* 7.8* 8.0*  --   MG 1.6*  --  1.6* 1.4* 1.4* 1.5* 1.6* 2.0  PHOS 4.6  --  4.3 4.1 3.6 3.7  --   --    < > = values in this interval not displayed.   Liver Function Tests: Recent Labs  Lab 04/06/22 0307  ALBUMIN 2.1*   CBG: Recent Labs  Lab 04/04/22 0453 04/04/22 0550  GLUCAP 69* 89    Discharge time spent: approximately 35 minutes spent on discharge counseling, evaluation of patient on day of discharge, and coordination of discharge planning with nursing, social work, pharmacy and case management  Signed: Edwin Dada, MD [facility] 04/08/2022

## 2022-04-08 NOTE — TOC Transition Note (Signed)
Transition of Care [facility]) - CM/SW Discharge Note   Patient Details  Name: Sarah Wu MRN: [id number] Date of Birth: 1950/03/24  [facility]) CM/SW Contact:  Henrietta Dine, RN Phone Number: 04/08/2022, 9:23 AM   Clinical Narrative:    DC  orders received; spoke w/ pt's dtr [name]; she says email received from agency says hoyer and sliding board has been ordered; no TOC needs.   Final next level of care: Home/Self Care Barriers to Discharge: No Barriers Identified   Patient Goals and CMS Choice      Discharge Placement                         Discharge Plan and Services Additional resources added to the After Visit Summary for                                       Social Determinants of Health (SDOH) Interventions SDOH Screenings   Food Insecurity: No Food Insecurity (03/23/2022)  Housing: Low Risk  (03/23/2022)  Transportation Needs: No Transportation Needs (03/23/2022)  Utilities: Not At Risk (03/23/2022)  Tobacco Use: Low Risk  (03/23/2022)     Readmission Risk Interventions     No data to display

## 2022-04-10 ENCOUNTER — Ambulatory Visit: Payer: Medicare Other | Admitting: Internal Medicine

## 2022-04-13 ENCOUNTER — Telehealth: Payer: Self-pay | Admitting: Oncology

## 2022-04-13 NOTE — Telephone Encounter (Signed)
Called [name] to see if she would like to complete her last 2 radiation treatments. Sarah Wu said she does not want to complete them and her son was with her and said she is not able to because she has difficulty getting around.

## 2022-04-22 ENCOUNTER — Other Ambulatory Visit (HOSPITAL_COMMUNITY): Payer: Self-pay

## 2022-04-22 ENCOUNTER — Other Ambulatory Visit: Payer: Self-pay | Admitting: Hematology and Oncology

## 2022-04-22 ENCOUNTER — Telehealth: Payer: Self-pay

## 2022-04-22 MED ORDER — DIPHENOXYLATE-ATROPINE 2.5-0.025 MG PO TABS: 2.0000 | ORAL_TABLET | Freq: Four times a day (QID) | ORAL | 0 refills | Status: DC | PRN

## 2022-04-22 NOTE — Telephone Encounter (Signed)
Thanks for the update I will keep an eye on her chart

## 2022-04-22 NOTE — Telephone Encounter (Signed)
Pt's daughter, Heath Lark called to request refill for Lomotil 2.5-0.025 mg 2 tabs PO QID PRN diarrhea qty 20 last filled 03/17/22 at [facility] on [address] in [place], [place]. Heath Lark states pt has taken imodium and this is not helping to suppress diarrhea. Advised I would make MD aware of request.

## 2022-04-22 NOTE — Telephone Encounter (Signed)
Spoke with patient and she reports N/V and reports 3 episodes of emesis in 30 mins. Her son states she is very weak and he is taking her to ED at [facility]. She states she has taken prescribed antiemetics with no relief.

## 2022-04-22 NOTE — Telephone Encounter (Signed)
I can refill, but she still need to take alternate imodium 4 mg 4 times a day with lomotil Her baseline is 4 mg when she gets up and every 4 hours for total of 4 doses If she still have diarrhea 1 hour after imodium, she can take lomotil

## 2022-04-27 ENCOUNTER — Encounter: Payer: Self-pay | Admitting: Hematology and Oncology

## 2022-04-29 ENCOUNTER — Ambulatory Visit: Payer: Medicare Other | Admitting: Internal Medicine

## 2022-05-01 ENCOUNTER — Ambulatory Visit: Payer: Medicare Other | Admitting: Internal Medicine

## 2022-05-01 NOTE — Progress Notes (Signed)
FMLA forms for Daughter completed as requested by Patient. Fax transmission confirmation received. Copy of forms emailed  to Daughter as requested.

## 2022-05-12 ENCOUNTER — Other Ambulatory Visit: Payer: Self-pay

## 2022-05-15 ENCOUNTER — Encounter: Payer: Self-pay | Admitting: Internal Medicine

## 2022-05-15 ENCOUNTER — Ambulatory Visit: Payer: Medicare Other | Admitting: Internal Medicine

## 2022-05-15 ENCOUNTER — Telehealth: Payer: Self-pay | Admitting: Oncology

## 2022-05-15 VITALS — BP 132/79 | HR 83 | Resp 16 | Ht 68.0 in | Wt 189.0 lb

## 2022-05-15 DIAGNOSIS — A0472 Enterocolitis due to Clostridium difficile, not specified as recurrent: Secondary | ICD-10-CM

## 2022-05-15 DIAGNOSIS — E782 Mixed hyperlipidemia: Secondary | ICD-10-CM

## 2022-05-15 DIAGNOSIS — Z01810 Encounter for preprocedural cardiovascular examination: Secondary | ICD-10-CM

## 2022-05-15 DIAGNOSIS — C55 Malignant neoplasm of uterus, part unspecified: Secondary | ICD-10-CM

## 2022-05-15 DIAGNOSIS — I10 Essential (primary) hypertension: Secondary | ICD-10-CM

## 2022-05-15 DIAGNOSIS — I480 Paroxysmal atrial fibrillation: Secondary | ICD-10-CM

## 2022-05-15 NOTE — Progress Notes (Signed)
Primary Physician/Referring:  Sable FeilManzo, Matthew T, DO  Patient ID: Sarah DockerMargaret Dini, female    DOB: 1950-06-01, 72 y.o.   MRN: [id number]  Chief Complaint  Patient presents with   Hospitalization Follow-up   New Patient (Initial Visit)   Medical Clearance   HPI:    Sarah Wu  is a 72 y.o.  female with past medical history significant for hypertension, hyperlipidemia, and uterine cancer who presented to the hospital in new onset Afib RVR. I managed to control her rates and convert her with amiodarone. Patient's hospital stay was complicated by c.diff colitis and possible ileus. She presents today for follow-up visit. She has been doing well since she left the hospital. Patient is tolerating her medications without issues or side effects. She has not experienced any bleeding or excess bruising on Eliquis. She is waiting for work with PT and they are supposed to be coming to her house to work with her. She wants to get her knee replaced again because she is in so much pain and cannot walk due to it. Patient denies chest pain, shortness of breath, palpitations, diaphoresis, syncope, edema, PND, orthopnea.   Past Medical History:  Diagnosis Date   Arthritis    Cancer    Fibroid tumor    Hx of mammogram 2022   [place]   Hypercholesteremia    Hypertension    Post-menopausal bleeding    Pre-diabetes    Past Surgical History:  Procedure Laterality Date   EXAMINATION UNDER ANESTHESIA  08/11/2021   with cervical mass biopsies   IR CHEST FLUORO  10/14/2021   IR IMAGING GUIDED PORT INSERTION  09/23/2021   IR IMAGING GUIDED PORT INSERTION  10/17/2021   LAPAROSCOPY N/A 09/10/2021   Procedure: DIAGNOSTIC LAPAROSCOPY;  Surgeon: Carver Filaucker, Katherine R, MD;  Location: [facility];  Service: Gynecology;  Laterality: N/A;   REPLACEMENT TOTAL KNEE BILATERAL     TUBAL LIGATION     Family History  Problem Relation Age of Onset   Breast cancer Sister        half-sister   Colon cancer Neg Hx     Ovarian cancer Neg Hx    Endometrial cancer Neg Hx    Pancreatic cancer Neg Hx    Prostate cancer Neg Hx     Social History   Tobacco Use   Smoking status: Never    Passive exposure: Never   Smokeless tobacco: Never  Substance Use Topics   Alcohol use: Never   Marital Status: Single  ROS  Review of Systems  Cardiovascular:  Negative for chest pain, dyspnea on exertion, irregular heartbeat, leg swelling, near-syncope, orthopnea, palpitations, paroxysmal nocturnal dyspnea and syncope.   Objective  Blood pressure 132/79, pulse 83, resp. rate 16, height 5\' 8"  (1.727 m), weight 189 lb (85.7 kg), SpO2 93 %. Body mass index is 28.74 kg/m.     05/15/2022   10:53 AM 04/08/2022    9:09 AM 04/08/2022    4:37 AM  Vitals with BMI  Height 5\' 8"     Weight 189 lbs    BMI 28.74    Systolic 132 116 [id number]  Diastolic 79 74 68  Pulse 83 98 87     Physical Exam Vitals reviewed.  HENT:     Head: Normocephalic and atraumatic.  Cardiovascular:     Rate and Rhythm: Normal rate and regular rhythm.     Heart sounds: Normal heart sounds. No murmur heard. Pulmonary:     Effort: Pulmonary effort is normal.  Breath sounds: Normal breath sounds.  Abdominal:     General: Bowel sounds are normal.  Musculoskeletal:     Right lower leg: Edema (trace) present.     Left lower leg: Edema (trace) present.  Skin:    General: Skin is warm and dry.  Neurological:     Mental Status: She is alert.     Medications and allergies   Allergies  Allergen Reactions   Hydrocodone-Acetaminophen Swelling    Swells stomach   Oxycodone-Acetaminophen Swelling    Swells stomach     Medication list after today's encounter   Current Outpatient Medications:    allopurinol (ZYLOPRIM) 300 MG tablet, Take 300 mg by mouth daily., Disp: , Rfl:    amiodarone (PACERONE) 200 MG tablet, Take 1 tablet (200 mg total) by mouth daily., Disp: 30 tablet, Rfl: 3   apixaban (ELIQUIS) 5 MG TABS tablet, Take 1 tablet (5 mg  total) by mouth 2 (two) times daily., Disp: 60 tablet, Rfl: 3   ascorbic acid (VITAMIN C) 250 MG tablet, Take 250 mg by mouth daily., Disp: , Rfl:    Banana Flakes (BANATROL PLUS) PACK, Take by mouth., Disp: , Rfl:    Biotin (BIOTIN 5000) 5 MG CAPS, Take 5 mg by mouth daily., Disp: , Rfl:    ferrous sulfate 325 (65 FE) MG tablet, Take 325 mg by mouth daily with breakfast., Disp: , Rfl:    fidaxomicin (DIFICID) 200 MG TABS tablet, Take 200 mg by mouth 2 (two) times daily., Disp: , Rfl:    Lidocaine 3.5 % PTCH, Apply 1 patch topically daily as needed (knee pain)., Disp: , Rfl:    metoprolol succinate (TOPROL-XL) 25 MG 24 hr tablet, Take 25 mg by mouth daily., Disp: , Rfl:    Multiple Vitamins-Minerals (MULTIVITAMIN WITH MINERALS) tablet, Take 1 tablet by mouth at bedtime. Centrum, Disp: , Rfl:    nystatin (MYCOSTATIN/NYSTOP) powder, Apply 1 Application topically., Disp: , Rfl:    potassium chloride (KLOR-CON) 10 MEQ tablet, Take 10 mEq by mouth daily., Disp: , Rfl:    simvastatin (ZOCOR) 10 MG tablet, Take 10 mg by mouth daily at 6 PM., Disp: , Rfl:    OVER THE COUNTER MEDICATION, Take 1 tablet by mouth daily. [facility] and brain support (Patient not taking: Reported on 05/15/2022), Disp: , Rfl:    prochlorperazine (COMPAZINE) 10 MG tablet, Take 1 tablet (10 mg total) by mouth every 6 (six) hours as needed for nausea or vomiting. (Patient not taking: Reported on 05/15/2022), Disp: 30 tablet, Rfl: 1  Laboratory examination:   Lab Results  Component Value Date   NA 135 04/07/2022   K 4.1 04/07/2022   CO2 26 04/07/2022   GLUCOSE 77 04/07/2022   BUN <5 (L) 04/07/2022   CREATININE 0.44 04/07/2022   CALCIUM 8.0 (L) 04/07/2022   GFRNONAA >60 04/07/2022       Latest Ref Rng & Units 04/07/2022    4:29 AM 04/06/2022    3:07 AM 04/05/2022    3:04 AM  CMP  Glucose 70 - 99 mg/dL 77  84  78   BUN 8 - 23 mg/dL <5  <5  <5   Creatinine 0.44 - 1.00 mg/dL 2.62  0.35  5.97   Sodium 135 - 145  mmol/L 135  135  135   Potassium 3.5 - 5.1 mmol/L 4.1  3.2  3.8   Chloride 98 - 111 mmol/L 100  99  98   CO2 22 - 32 mmol/L  26  26  26    Calcium 8.9 - 10.3 mg/dL 8.0  7.8  8.1       Latest Ref Rng & Units 04/06/2022    3:07 AM 04/05/2022    3:04 AM 04/01/2022    3:23 AM  CBC  WBC 4.0 - 10.5 K/uL 4.1  4.5  4.2   Hemoglobin 12.0 - 15.0 g/dL 8.6  8.7  8.7   Hematocrit 36.0 - 46.0 % 28.0  28.6  27.6   Platelets 150 - 400 K/uL 235  231  195     Lipid Panel No results for input(s): "CHOL", "TRIG", "LDLCALC", "VLDL", "HDL", "CHOLHDL", "LDLDIRECT" in the last 8760 hours.  HEMOGLOBIN A1C Lab Results  Component Value Date   HGBA1C 5.4 12/30/2021   MPG 108.28 12/30/2021   TSH Recent Labs    02/19/22 0841 03/12/22 0851 03/21/22 1518  TSH 0.532 0.274* 2.015    External labs:     Radiology:    Cardiac Studies:   03/22/2022 ECHO: 1. Left ventricular ejection fraction, by estimation, is 65 to 70%. The  left ventricle has normal function. The left ventricle has no regional  wall motion abnormalities. There is mild left ventricular hypertrophy.  Left ventricular diastolic parameters  are consistent with Grade I diastolic dysfunction (impaired relaxation).   2. Right ventricular systolic function is normal. The right ventricular  size is normal. Tricuspid regurgitation signal is inadequate for assessing  PA pressure.   3. The mitral valve is grossly normal. Trivial mitral valve  regurgitation. No evidence of mitral stenosis.   4. The aortic valve is grossly normal. Aortic valve regurgitation is not  visualized. No aortic stenosis is present.    EKG:   05/15/2022: Sinus Rhythm with incomplete left bundle branch block, rate 81 bpm. Diffuse nonspecific T-abnormality.  03/21/2022: Afib RVR with ST-T wave abnormality suspect due to LVH.  Assessment     ICD-10-CM   1. Paroxysmal atrial fibrillation  I48.0 EKG 12-Lead    PCV MYOCARDIAL PERFUSION WITH LEXISCAN    2. Essential  hypertension  I10     3. Mixed hyperlipidemia  E78.2     4. C. difficile colitis  A04.72     5. 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 Encounter for pre-operative cardiovascular clearance  Z01.810 PCV MYOCARDIAL PERFUSION WITH LEXISCAN       Orders Placed This Encounter  Procedures   PCV MYOCARDIAL PERFUSION WITH LEXISCAN    Standing Status:   Future    Standing Expiration Date:   07/15/2022   EKG 12-Lead    No orders of the defined types were placed in this encounter.   Medications Discontinued During This Encounter  Medication Reason   atorvastatin (LIPITOR) 10 MG tablet    atenolol (TENORMIN) 50 MG tablet Stop Taking at Discharge   leptospermum manuka honey (MEDIHONEY) PSTE paste    diphenoxylate-atropine (LOMOTIL) 2.5-0.025 MG tablet    lidocaine-prilocaine (EMLA) cream    ondansetron (ZOFRAN) 8 MG tablet      Recommendations:   Monzerrath Mcburney is a 72 y.o.  female with PAF, HTN, and uterine cancer  Paroxysmal atrial fibrillation Patient in sinus rhythm today Continue amiodarone 200 mg daily and Toprol XL 25 mg daily Tolerating Eliquis without bleeding CHA2DS2VASc = at least 3   Encounter for pre-operative cardiovascular clearance Echocardiogram in hospital was within normal limits She will require nuclear stress test to proceed with any surgery Currently is wheelchair bound and cannot  accurately assess surgical risk without stress test   Essential hypertension Continue current cardiac medications. Encourage low-sodium diet, less than 2000 mg daily. Follow-up in 1-2 months or sooner if needed.   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 difficile colitis Patient had 1 month inpatient hospital stay due to this She is followed by oncology and currently she is cancer free     Clotilde DieterSabina Magdaleno Lortie, DO, [facility]  05/15/2022, 11:23 AM Office: [phone] Pager: [phone]

## 2022-05-15 NOTE — Telephone Encounter (Signed)
Attempted to call Treva and her voice mail is not set up.  Called her daughter, Orlie Pollen, who said Trust is doing a lot better.  She had an infusion (Zinplava) on 05/07/22 for C Diff. The diarrhea has stopped and she is trying to get her strength back.  Syniya had a fall when she had C Diff and now needs a knee replacement.  They are in the process of getting that scheduled.  Asked if Elene is interested in maintenance treatment for her endometrial cancer.  Orlie Pollen is going to check with Claris Che and call back.

## 2022-05-16 ENCOUNTER — Other Ambulatory Visit: Payer: Self-pay

## 2022-05-18 ENCOUNTER — Ambulatory Visit: Payer: Medicare Other

## 2022-05-18 ENCOUNTER — Telehealth: Payer: Self-pay

## 2022-05-18 DIAGNOSIS — Z01810 Encounter for preprocedural cardiovascular examination: Secondary | ICD-10-CM

## 2022-05-18 DIAGNOSIS — I480 Paroxysmal atrial fibrillation: Secondary | ICD-10-CM

## 2022-05-18 NOTE — Telephone Encounter (Signed)
Daughter Gaynelle Adu calling for a refill on her ATB for UTI. Pt at cardiologist today for a stress test. Pt could not finish due to urinary urgency. Patient's PCP told daughter to call the doctor who prescribed antibiotics for a refill. Told Robbie that no ATB have been sent in from this office for a UTI. Suggested that she call the PCP to have her urine checked. Pt lives a distance in [place], [place]. Daughter will call PCP's office.

## 2022-05-19 NOTE — Progress Notes (Signed)
Normal and she is clear for surgery

## 2022-05-19 NOTE — Progress Notes (Signed)
Called and spoke with patient regarding her stress test results.

## 2022-05-20 ENCOUNTER — Other Ambulatory Visit: Payer: Self-pay

## 2022-05-29 ENCOUNTER — Encounter: Payer: Self-pay | Admitting: Hematology and Oncology

## 2022-06-01 ENCOUNTER — Encounter: Payer: Self-pay | Admitting: Hematology and Oncology

## 2022-06-19 ENCOUNTER — Encounter: Payer: Self-pay | Admitting: Hematology and Oncology

## 2022-06-26 ENCOUNTER — Ambulatory Visit: Payer: Medicare Other | Admitting: Internal Medicine

## 2022-06-26 ENCOUNTER — Telehealth: Payer: Medicare Other | Admitting: Internal Medicine

## 2022-06-26 DIAGNOSIS — I10 Essential (primary) hypertension: Secondary | ICD-10-CM

## 2022-06-26 DIAGNOSIS — I480 Paroxysmal atrial fibrillation: Secondary | ICD-10-CM

## 2022-06-26 DIAGNOSIS — Z01810 Encounter for preprocedural cardiovascular examination: Secondary | ICD-10-CM

## 2022-06-27 ENCOUNTER — Other Ambulatory Visit: Payer: Self-pay

## 2022-06-29 NOTE — Progress Notes (Signed)
Primary Physician/Referring:  Sable Feil, DO  Patient ID: Sarah Wu, female    DOB: 20-Nov-1950, 72 y.o.   MRN: [id number]  No chief complaint on file.  HPI:    Sarah Wu  is a 72 y.o.  female with past medical history significant for hypertension, hyperlipidemia, and uterine cancer who is having a telephone follow-up visit with me. She is waiting for her knee surgery to be scheduled. She has 2 more chemo treatments left and hopefully she is done after that. Otherwise, she has been feeling pretty good her knee is just very painful and she cannot wait to have her surgery. Patient denies chest pain, shortness of breath, palpitations, diaphoresis, syncope, edema, PND, orthopnea.   Past Medical History:  Diagnosis Date   Arthritis    Cancer (HCC)    Fibroid tumor    Hx of mammogram 2022   [place]   Hypercholesteremia    Hypertension    Post-menopausal bleeding    Pre-diabetes    Past Surgical History:  Procedure Laterality Date   EXAMINATION UNDER ANESTHESIA  08/11/2021   with cervical mass biopsies   IR CHEST FLUORO  10/14/2021   IR IMAGING GUIDED PORT INSERTION  09/23/2021   IR IMAGING GUIDED PORT INSERTION  10/17/2021   LAPAROSCOPY N/A 09/10/2021   Procedure: DIAGNOSTIC LAPAROSCOPY;  Surgeon: Carver Fila, MD;  Location: [facility];  Service: Gynecology;  Laterality: N/A;   REPLACEMENT TOTAL KNEE BILATERAL     TUBAL LIGATION     Family History  Problem Relation Age of Onset   Breast cancer Sister        half-sister   Colon cancer Neg Hx    Ovarian cancer Neg Hx    Endometrial cancer Neg Hx    Pancreatic cancer Neg Hx    Prostate cancer Neg Hx     Social History   Tobacco Use   Smoking status: Never    Passive exposure: Never   Smokeless tobacco: Never  Substance Use Topics   Alcohol use: Never   Marital Status: Single  ROS  Review of Systems  Cardiovascular:  Negative for chest pain, dyspnea on exertion, irregular heartbeat, leg  swelling, near-syncope, orthopnea, palpitations, paroxysmal nocturnal dyspnea and syncope.  Musculoskeletal:  Positive for arthritis and joint pain.   Objective  There were no vitals taken for this visit. There is no height or weight on file to calculate BMI.     05/15/2022   10:53 AM 04/08/2022    9:09 AM 04/08/2022    4:37 AM  Vitals with BMI  Height 5\' 8"     Weight 189 lbs    BMI 28.74    Systolic 132 116 409  Diastolic 79 74 68  Pulse 83 98 87    PE not performed as this was a phone visit.  Medications and allergies   Allergies  Allergen Reactions   Hydrocodone-Acetaminophen Swelling    Swells stomach   Oxycodone-Acetaminophen Swelling    Swells stomach     Medication list after today's encounter   Current Outpatient Medications:    allopurinol (ZYLOPRIM) 300 MG tablet, Take 300 mg by mouth daily., Disp: , Rfl:    amiodarone (PACERONE) 200 MG tablet, Take 1 tablet (200 mg total) by mouth daily., Disp: 30 tablet, Rfl: 3   apixaban (ELIQUIS) 5 MG TABS tablet, Take 1 tablet (5 mg total) by mouth 2 (two) times daily., Disp: 60 tablet, Rfl: 3   ascorbic acid (VITAMIN C) 250 MG tablet,  Take 250 mg by mouth daily., Disp: , Rfl:    Biotin (BIOTIN 5000) 5 MG CAPS, Take 5 mg by mouth daily., Disp: , Rfl:    ferrous sulfate 325 (65 FE) MG tablet, Take 325 mg by mouth daily with breakfast., Disp: , Rfl:    Lidocaine 3.5 % PTCH, Apply 1 patch topically daily as needed (knee pain)., Disp: , Rfl:    metoprolol succinate (TOPROL-XL) 25 MG 24 hr tablet, Take 25 mg by mouth daily., Disp: , Rfl:    Multiple Vitamins-Minerals (MULTIVITAMIN WITH MINERALS) tablet, Take 1 tablet by mouth at bedtime. Centrum, Disp: , Rfl:    nystatin (MYCOSTATIN/NYSTOP) powder, Apply 1 Application topically., Disp: , Rfl:    OVER THE COUNTER MEDICATION, Take 1 tablet by mouth daily. [facility] and brain support (Patient not taking: Reported on 05/15/2022), Disp: , Rfl:    potassium chloride (KLOR-CON) 10  MEQ tablet, Take 10 mEq by mouth daily., Disp: , Rfl:    prochlorperazine (COMPAZINE) 10 MG tablet, Take 1 tablet (10 mg total) by mouth every 6 (six) hours as needed for nausea or vomiting. (Patient not taking: Reported on 05/15/2022), Disp: 30 tablet, Rfl: 1   simvastatin (ZOCOR) 10 MG tablet, Take 10 mg by mouth daily at 6 PM., Disp: , Rfl:   Laboratory examination:   Lab Results  Component Value Date   NA 135 04/07/2022   K 4.1 04/07/2022   CO2 26 04/07/2022   GLUCOSE 77 04/07/2022   BUN <5 (L) 04/07/2022   CREATININE 0.44 04/07/2022   CALCIUM 8.0 (L) 04/07/2022   GFRNONAA >60 04/07/2022       Latest Ref Rng & Units 04/07/2022    4:29 AM 04/06/2022    3:07 AM 04/05/2022    3:04 AM  CMP  Glucose 70 - 99 mg/dL 77  84  78   BUN 8 - 23 mg/dL <5  <5  <5   Creatinine 0.44 - 1.00 mg/dL 4.09  8.11  9.14   Sodium 135 - 145 mmol/L 135  135  135   Potassium 3.5 - 5.1 mmol/L 4.1  3.2  3.8   Chloride 98 - 111 mmol/L 100  99  98   CO2 22 - 32 mmol/L 26  26  26    Calcium 8.9 - 10.3 mg/dL 8.0  7.8  8.1       Latest Ref Rng & Units 04/06/2022    3:07 AM 04/05/2022    3:04 AM 04/01/2022    3:23 AM  CBC  WBC 4.0 - 10.5 K/uL 4.1  4.5  4.2   Hemoglobin 12.0 - 15.0 g/dL 8.6  8.7  8.7   Hematocrit 36.0 - 46.0 % 28.0  28.6  27.6   Platelets 150 - 400 K/uL 235  231  195     Lipid Panel No results for input(s): "CHOL", "TRIG", "LDLCALC", "VLDL", "HDL", "CHOLHDL", "LDLDIRECT" in the last 8760 hours.  HEMOGLOBIN A1C Lab Results  Component Value Date   HGBA1C 5.4 12/30/2021   MPG 108.28 12/30/2021   TSH Recent Labs    02/19/22 0841 03/12/22 0851 03/21/22 1518  TSH 0.532 0.274* 2.015    External labs:     Radiology:    Cardiac Studies:   03/22/2022 ECHO: 1. Left ventricular ejection fraction, by estimation, is 65 to 70%. The  left ventricle has normal function. The left ventricle has no regional  wall motion abnormalities. There is mild left ventricular hypertrophy.  Left  ventricular diastolic parameters  are consistent with Grade I diastolic dysfunction (impaired relaxation).   2. Right ventricular systolic function is normal. The right ventricular  size is normal. Tricuspid regurgitation signal is inadequate for assessing  PA pressure.   3. The mitral valve is grossly normal. Trivial mitral valve  regurgitation. No evidence of mitral stenosis.   4. The aortic valve is grossly normal. Aortic valve regurgitation is not  visualized. No aortic stenosis is present.    Regadenoson (with Mod Bruce protocol) Nuclear stress test 05/18/2022 Myocardial perfusion is normal. Breast attenuation is present. Perfusion is better at stress in all myocardial segments, doubt ischemia. Overall LV systolic function is normal without regional wall motion abnormalities. Stress LV EF: 56%. Low risk study. Nondiagnostic ECG stress. The heart rate response was consistent with Regadenoson. The blood pressure response was physiologic. No previous exam available for comparison.   *patient may proceed with knee surgery.   EKG:   05/15/2022: Sinus Rhythm with incomplete left bundle branch block, rate 81 bpm. Diffuse nonspecific T-abnormality.  03/21/2022: Afib RVR with ST-T wave abnormality suspect due to LVH.  Assessment     ICD-10-CM   1. Paroxysmal atrial fibrillation (HCC)  I48.0     2. Essential hypertension  I10     3. Encounter for pre-operative cardiovascular clearance  Z01.810        No orders of the defined types were placed in this encounter.   No orders of the defined types were placed in this encounter.   There are no discontinued medications.    Recommendations:   Sarah Wu is a 72 y.o.  female with PAF, HTN, and uterine cancer  Paroxysmal atrial fibrillation Patient in sinus rhythm today Continue amiodarone 200 mg daily and Toprol XL 25 mg daily Tolerating Eliquis without bleeding CHA2DS2VASc = at least 3   Encounter for pre-operative  cardiovascular clearance Echocardiogram and stress test negative for ischemia She may proceed with surgery   Essential hypertension Continue current cardiac medications. Encourage low-sodium diet, less than 2000 mg daily. Follow-up in 1-2 months or sooner if needed.     Clotilde Dieter, DO, [facility]  06/29/2022, 9:33 AM Office: [phone] Pager: [phone]

## 2022-07-08 ENCOUNTER — Other Ambulatory Visit: Payer: Self-pay | Admitting: Hematology and Oncology

## 2022-07-15 ENCOUNTER — Encounter (HOSPITAL_COMMUNITY): Payer: Self-pay

## 2022-07-15 ENCOUNTER — Encounter: Payer: Self-pay | Admitting: Hematology and Oncology

## 2022-07-28 ENCOUNTER — Encounter: Payer: Self-pay | Admitting: Hematology and Oncology

## 2022-07-30 ENCOUNTER — Telehealth: Payer: Self-pay

## 2022-07-30 ENCOUNTER — Other Ambulatory Visit: Payer: Self-pay

## 2022-07-30 DIAGNOSIS — M6281 Muscle weakness (generalized): Secondary | ICD-10-CM | POA: Diagnosis not present

## 2022-07-30 DIAGNOSIS — G620 Drug-induced polyneuropathy: Secondary | ICD-10-CM | POA: Diagnosis not present

## 2022-07-30 DIAGNOSIS — L8990 Pressure ulcer of unspecified site, unspecified stage: Secondary | ICD-10-CM | POA: Diagnosis not present

## 2022-07-30 NOTE — Telephone Encounter (Signed)
I have not seen her for a very long time No labs either I am not comfortable refilling it without an appointment

## 2022-07-30 NOTE — Telephone Encounter (Signed)
Called and left below message for daughter. Ask her to call the office back if she wants to schedule appt with Dr. Bertis Ruddy. Instructed her to call PCP for refill.

## 2022-07-30 NOTE — Telephone Encounter (Signed)
Returned call to daughter. She is requesting Lasix 20 mg refill to [facility] in [place].

## 2022-07-31 DIAGNOSIS — G620 Drug-induced polyneuropathy: Secondary | ICD-10-CM | POA: Diagnosis not present

## 2022-07-31 DIAGNOSIS — L8990 Pressure ulcer of unspecified site, unspecified stage: Secondary | ICD-10-CM | POA: Diagnosis not present

## 2022-07-31 DIAGNOSIS — M6281 Muscle weakness (generalized): Secondary | ICD-10-CM | POA: Diagnosis not present

## 2022-08-19 ENCOUNTER — Ambulatory Visit: Payer: Self-pay | Admitting: Student

## 2022-08-20 ENCOUNTER — Encounter: Payer: Self-pay | Admitting: Hematology and Oncology

## 2022-08-24 NOTE — Progress Notes (Addendum)
Anesthesia Review:  PCP:L OV 07/01/22 on chart - Lance Coon clearance 07/03/22 on chart  Cardiologist : Clotilde Dieter- telephone visit- 06/26/22 LOV- 05/17/22  Chest x-ray : EKG : 05/15/22  Echo : 03/12/22  Stress test: 05/19/22  Cardiac Cath :  Activity level: can do a flgiht of stiars without difficutly  Sleep Study/ CPAP : none  Fasting Blood Sugar :      / Checks Blood Sugar -- times a day:   Blood Thinner/ Instructions /Last Dose: ASA / Instructions/ Last Dose :    Eliquis- pt states at time of preop appt she has not yet received instructons regardin Eliquis.  Instructed pt verbally and on preop instructons to contact Dr Linna Caprice office in regards to Eliquis instructions.   Daughter present with pt at time of preop appt.

## 2022-08-25 NOTE — Patient Instructions (Addendum)
SURGICAL WAITING ROOM VISITATION  Patients having surgery or a procedure may have no more than 2 support people in the waiting area - these visitors may rotate.    Children under the age of 54 must have an adult with them who is not the patient.  Due to an increase in RSV and influenza rates and associated hospitalizations, children ages 81 and under may not visit patients in [facility] hospitals.  If the patient needs to stay at the hospital during part of their recovery, the visitor guidelines for inpatient rooms apply. Pre-op nurse will coordinate an appropriate time for 1 support person to accompany patient in pre-op.  This support person may not rotate.    Please refer to the [facility] website for the visitor guidelines for Inpatients (after your surgery is over and you are in a regular room).       Your procedure is scheduled on:  09/09/22    Report to [facility] Main Entrance    Report to admitting at   [phone]   Call this number if you have problems the morning of surgery [phone]   Do not eat food :After Midnight.   After Midnight you may have the following liquids until __ 0700____ AM  DAY OF SURGERY  Water Non-Citrus Juices (without pulp, NO RED-Apple, White grape, White cranberry) Black Coffee (NO MILK/CREAM OR CREAMERS, sugar ok)  Clear Tea (NO MILK/CREAM OR CREAMERS, sugar ok) regular and decaf                             Plain Jell-O (NO RED)                                           Fruit ices (not with fruit pulp, NO RED)                                     Popsicles (NO RED)                                                               Sports drinks like Gatorade (NO RED)                  The day of surgery:  Drink ONE (1) Pre-Surgery Clear Ensure or G2 at  0700 AM ( have completed by )  the morning of surgery. Drink in one sitting. Do not sip.  This drink was given to you during your hospital  pre-op appointment visit. Nothing else to drink  after completing the  Pre-Surgery Clear Ensure or G2.          If you have questions, please contact your surgeon's office.       Oral Hygiene is also important to reduce your risk of infection.                                    Remember - BRUSH YOUR TEETH THE MORNING OF SURGERY WITH YOUR  REGULAR TOOTHPASTE  DENTURES WILL BE REMOVED PRIOR TO SURGERY PLEASE DO NOT APPLY "Poly grip" OR ADHESIVES!!!   Do NOT smoke after Midnight   Take these medicines the morning of surgery with A SIP OF WATER:  allopurinol, pacerone, toprol   DO NOT TAKE ANY ORAL DIABETIC MEDICATIONS DAY OF YOUR SURGERY  Bring CPAP mask and tubing day of surgery.                              You may not have any metal on your body including hair pins, jewelry, and body piercing             Do not wear make-up, lotions, powders, perfumes/cologne, or deodorant  Do not wear nail polish including gel and S&S, artificial/acrylic nails, or any other type of covering on natural nails including finger and toenails. If you have artificial nails, gel coating, etc. that needs to be removed by a nail salon please have this removed prior to surgery or surgery may need to be canceled/ delayed if the surgeon/ anesthesia feels like they are unable to be safely monitored.   Do not shave  48 hours prior to surgery.               Men may shave face and neck.   Do not bring valuables to the hospital. [facility] IS NOT             RESPONSIBLE   FOR VALUABLES.   Contacts, glasses, dentures or bridgework may not be worn into surgery.   Bring small overnight bag day of surgery.   DO NOT BRING YOUR HOME MEDICATIONS TO THE HOSPITAL. PHARMACY WILL DISPENSE MEDICATIONS LISTED ON YOUR MEDICATION LIST TO YOU DURING YOUR ADMISSION IN THE HOSPITAL!    Patients discharged on the day of surgery will not be allowed to drive home.  Someone NEEDS to stay with you for the first 24 hours after anesthesia.   Special Instructions: Bring a copy of  your healthcare power of attorney and living will documents the day of surgery if you haven't scanned them before.              Please read over the following fact sheets you were given: IF YOU HAVE QUESTIONS ABOUT YOUR PRE-OP INSTRUCTIONS PLEASE CALL [phone]   If you received a COVID test during your pre-op visit  it is requested that you wear a mask when out in public, stay away from anyone that may not be feeling well and notify your surgeon if you develop symptoms. If you test positive for Covid or have been in contact with anyone that has tested positive in the last 10 days please notify you surgeon.   ____________________________________   Pre-operative 5 CHG Bath Instructions   You can play a key role in reducing the risk of infection after surgery. Your skin needs to be as free of germs as possible. You can reduce the number of germs on your skin by washing with CHG (chlorhexidine gluconate) soap before surgery. CHG is an antiseptic soap that kills germs and continues to kill germs even after washing.   DO NOT use if you have an allergy to chlorhexidine/CHG or antibacterial soaps. If your skin becomes reddened or irritated, stop using the CHG and notify one of our RNs at [phone].   Please shower with the CHG soap starting 4 days before surgery using the following schedule:  Please keep in mind the following:  DO NOT shave, including legs and underarms, starting the day of your first shower.   You may shave your face at any point before/day of surgery.  Place clean sheets on your bed the day you start using CHG soap. Use a clean washcloth (not used since being washed) for each shower. DO NOT sleep with pets once you start using the CHG.   CHG Shower Instructions:  If you choose to wash your hair and private area, wash first with your normal shampoo/soap.  After you use shampoo/soap, rinse your hair and body thoroughly to remove shampoo/soap residue.  Turn the water  OFF and apply about 3 tablespoons (45 ml) of CHG soap to a CLEAN washcloth.  Apply CHG soap ONLY FROM YOUR NECK DOWN TO YOUR TOES (washing for 3-5 minutes)  DO NOT use CHG soap on face, private areas, open wounds, or sores.  Pay special attention to the area where your surgery is being performed.  If you are having back surgery, having someone wash your back for you may be helpful. Wait 2 minutes after CHG soap is applied, then you may rinse off the CHG soap.  Pat dry with a clean towel  Put on clean clothes/pajamas   If you choose to wear lotion, please use ONLY the CHG-compatible lotions on the back of this paper.     Additional instructions for the day of surgery: DO NOT APPLY any lotions, deodorants, cologne, or perfumes.   Put on clean/comfortable clothes.  Brush your teeth.  Ask your nurse before applying any prescription medications to the skin.      CHG Compatible Lotions   Aveeno Moisturizing lotion  Cetaphil Moisturizing Cream  Cetaphil Moisturizing Lotion  Clairol Herbal Essence Moisturizing Lotion, Dry Skin  Clairol Herbal Essence Moisturizing Lotion, Extra Dry Skin  Clairol Herbal Essence Moisturizing Lotion, Normal Skin  Curel Age Defying Therapeutic Moisturizing Lotion with Alpha Hydroxy  Curel Extreme Care Body Lotion  Curel Soothing Hands Moisturizing Hand Lotion  Curel Therapeutic Moisturizing Cream, Fragrance-Free  Curel Therapeutic Moisturizing Lotion, Fragrance-Free  Curel Therapeutic Moisturizing Lotion, Original Formula  Eucerin Daily Replenishing Lotion  Eucerin Dry Skin Therapy Plus Alpha Hydroxy Crme  Eucerin Dry Skin Therapy Plus Alpha Hydroxy Lotion  Eucerin Original Crme  Eucerin Original Lotion  Eucerin Plus Crme Eucerin Plus Lotion  Eucerin TriLipid Replenishing Lotion  Keri Anti-Bacterial Hand Lotion  Keri Deep Conditioning Original Lotion Dry Skin Formula Softly Scented  Keri Deep Conditioning Original Lotion, Fragrance Free Sensitive  Skin Formula  Keri Lotion Fast Absorbing Fragrance Free Sensitive Skin Formula  Keri Lotion Fast Absorbing Softly Scented Dry Skin Formula  Keri Original Lotion  Keri Skin Renewal Lotion Keri Silky Smooth Lotion  Keri Silky Smooth Sensitive Skin Lotion  Nivea Body Creamy Conditioning Oil  Nivea Body Extra Enriched Teacher, adult education Moisturizing Lotion Nivea Crme  Nivea Skin Firming Lotion  NutraDerm 30 Skin Lotion  NutraDerm Skin Lotion  NutraDerm Therapeutic Skin Cream  NutraDerm Therapeutic Skin Lotion  ProShield Protective Hand Cream  Provon moisturizing lotion

## 2022-08-27 ENCOUNTER — Encounter (HOSPITAL_COMMUNITY): Payer: Self-pay

## 2022-08-27 ENCOUNTER — Other Ambulatory Visit: Payer: Self-pay

## 2022-08-27 ENCOUNTER — Encounter (HOSPITAL_COMMUNITY)
Admission: RE | Admit: 2022-08-27 | Discharge: 2022-08-27 | Disposition: A | Discharge status: Home / Self Care | Payer: Medicare HMO | Source: Ambulatory Visit | Attending: Orthopedic Surgery | Admitting: Orthopedic Surgery

## 2022-08-27 VITALS — BP 108/67 | HR 79 | Temp 98.2°F | Resp 16 | Ht 68.0 in | Wt 180.0 lb

## 2022-08-27 DIAGNOSIS — Z01812 Encounter for preprocedural laboratory examination: Secondary | ICD-10-CM | POA: Insufficient documentation

## 2022-08-27 DIAGNOSIS — Z01818 Encounter for other preprocedural examination: Secondary | ICD-10-CM

## 2022-08-27 LAB — CBC
HCT: 36.4 % (ref 36.0–46.0)
Hemoglobin: 11.4 g/dL — ABNORMAL LOW (ref 12.0–15.0)
MCH: 29.8 pg (ref 26.0–34.0)
MCHC: 31.3 g/dL (ref 30.0–36.0)
MCV: 95 fL (ref 80.0–100.0)
Platelets: 244 10*3/uL (ref 150–400)
RBC: 3.83 MIL/uL — ABNORMAL LOW (ref 3.87–5.11)
RDW: 14 % (ref 11.5–15.5)
WBC: 4.6 10*3/uL (ref 4.0–10.5)
nRBC: 0 % (ref 0.0–0.2)

## 2022-08-27 LAB — TYPE AND SCREEN
ABO/RH(D): A POS
Antibody Screen: NEGATIVE

## 2022-08-27 LAB — BASIC METABOLIC PANEL
Anion gap: 10 (ref 5–15)
BUN: 15 mg/dL (ref 8–23)
CO2: 27 mmol/L (ref 22–32)
Calcium: 9.2 mg/dL (ref 8.9–10.3)
Chloride: 99 mmol/L (ref 98–111)
Creatinine, Ser: 0.82 mg/dL (ref 0.44–1.00)
GFR, Estimated: >60 mL/min (ref >60)
Glucose, Bld: 94 mg/dL (ref 70–99)
Potassium: 3.4 mmol/L — ABNORMAL LOW (ref 3.5–5.1)
Sodium: 136 mmol/L (ref 135–145)

## 2022-08-27 LAB — SURGICAL PCR SCREEN
MRSA, PCR: NEGATIVE
Staphylococcus aureus: NEGATIVE

## 2022-08-29 DIAGNOSIS — L8990 Pressure ulcer of unspecified site, unspecified stage: Secondary | ICD-10-CM | POA: Diagnosis not present

## 2022-08-29 DIAGNOSIS — G620 Drug-induced polyneuropathy: Secondary | ICD-10-CM | POA: Diagnosis not present

## 2022-08-29 DIAGNOSIS — M6281 Muscle weakness (generalized): Secondary | ICD-10-CM | POA: Diagnosis not present

## 2022-08-30 DIAGNOSIS — L8990 Pressure ulcer of unspecified site, unspecified stage: Secondary | ICD-10-CM | POA: Diagnosis not present

## 2022-08-30 DIAGNOSIS — M6281 Muscle weakness (generalized): Secondary | ICD-10-CM | POA: Diagnosis not present

## 2022-08-30 DIAGNOSIS — G620 Drug-induced polyneuropathy: Secondary | ICD-10-CM | POA: Diagnosis not present

## 2022-09-04 ENCOUNTER — Encounter: Payer: Self-pay | Admitting: Hematology and Oncology

## 2022-09-04 ENCOUNTER — Other Ambulatory Visit: Payer: Self-pay

## 2022-09-04 ENCOUNTER — Other Ambulatory Visit (HOSPITAL_COMMUNITY): Payer: Self-pay

## 2022-09-09 ENCOUNTER — Inpatient Hospital Stay (HOSPITAL_COMMUNITY): Admission: RE | Admit: 2022-09-09 | Payer: Medicare HMO | Source: Ambulatory Visit | Admitting: Orthopedic Surgery

## 2022-09-21 DIAGNOSIS — Z96651 Presence of right artificial knee joint: Secondary | ICD-10-CM | POA: Diagnosis not present

## 2022-09-21 DIAGNOSIS — T84032D Mechanical loosening of internal right knee prosthetic joint, subsequent encounter: Secondary | ICD-10-CM | POA: Diagnosis not present

## 2022-09-24 ENCOUNTER — Encounter: Payer: Self-pay | Admitting: Hematology and Oncology

## 2022-09-28 ENCOUNTER — Telehealth: Payer: Self-pay | Admitting: Oncology

## 2022-09-28 NOTE — Telephone Encounter (Signed)
I can see her on friday

## 2022-09-28 NOTE — Telephone Encounter (Signed)
Called Robbie back and offered appointment on 10/02/22 at 8:20.  G[facility]nelle Adu said they are not able to come in on Friday but could come in next week.  Appointment scheduled for 10/08/22 at 8:00.

## 2022-09-28 NOTE — Telephone Encounter (Signed)
Left a message with [facility] Adu regarding scheduling a follow up appointment with Dr. Bertis Ruddy.  Requested a return call.

## 2022-09-28 NOTE — Telephone Encounter (Signed)
Sarah Wu called back and would like to set up a follow up appointment for [facility] with Dr. Bertis Ruddy.  Advised we will call her back with an appointment.

## 2022-09-29 DIAGNOSIS — M6281 Muscle weakness (generalized): Secondary | ICD-10-CM | POA: Diagnosis not present

## 2022-09-29 DIAGNOSIS — L8990 Pressure ulcer of unspecified site, unspecified stage: Secondary | ICD-10-CM | POA: Diagnosis not present

## 2022-09-29 DIAGNOSIS — G620 Drug-induced polyneuropathy: Secondary | ICD-10-CM | POA: Diagnosis not present

## 2022-09-30 DIAGNOSIS — G620 Drug-induced polyneuropathy: Secondary | ICD-10-CM | POA: Diagnosis not present

## 2022-09-30 DIAGNOSIS — L8990 Pressure ulcer of unspecified site, unspecified stage: Secondary | ICD-10-CM | POA: Diagnosis not present

## 2022-09-30 DIAGNOSIS — M6281 Muscle weakness (generalized): Secondary | ICD-10-CM | POA: Diagnosis not present

## 2022-10-08 ENCOUNTER — Inpatient Hospital Stay: Payer: Medicare HMO

## 2022-10-08 ENCOUNTER — Encounter: Payer: Self-pay | Admitting: Hematology and Oncology

## 2022-10-08 ENCOUNTER — Telehealth: Payer: Self-pay | Admitting: *Deleted

## 2022-10-08 ENCOUNTER — Inpatient Hospital Stay: Payer: Medicare HMO | Attending: Hematology and Oncology | Admitting: Hematology and Oncology

## 2022-10-08 VITALS — BP 136/75 | HR 70 | Temp 97.7°F | Resp 18 | Ht 68.0 in | Wt 189.4 lb

## 2022-10-08 DIAGNOSIS — C55 Malignant neoplasm of uterus, part unspecified: Secondary | ICD-10-CM | POA: Insufficient documentation

## 2022-10-08 DIAGNOSIS — R600 Localized edema: Secondary | ICD-10-CM

## 2022-10-08 DIAGNOSIS — C549 Malignant neoplasm of corpus uteri, unspecified: Secondary | ICD-10-CM

## 2022-10-08 DIAGNOSIS — D638 Anemia in other chronic diseases classified elsewhere: Secondary | ICD-10-CM | POA: Diagnosis not present

## 2022-10-08 DIAGNOSIS — D649 Anemia, unspecified: Secondary | ICD-10-CM | POA: Diagnosis not present

## 2022-10-08 LAB — COMPREHENSIVE METABOLIC PANEL
ALT: 13 U/L (ref 0–44)
AST: 21 U/L (ref 15–41)
Albumin: 3.7 g/dL (ref 3.5–5.0)
Alkaline Phosphatase: 50 U/L (ref 38–126)
Anion gap: 8 (ref 5–15)
BUN: 16 mg/dL (ref 8–23)
CO2: 28 mmol/L (ref 22–32)
Calcium: 9.2 mg/dL (ref 8.9–10.3)
Chloride: 104 mmol/L (ref 98–111)
Creatinine, Ser: 0.99 mg/dL (ref 0.44–1.00)
GFR, Estimated: >60 mL/min (ref >60)
Glucose, Bld: 90 mg/dL (ref 70–99)
Potassium: 4 mmol/L (ref 3.5–5.1)
Sodium: 140 mmol/L (ref 135–145)
Total Bilirubin: 0.7 mg/dL (ref 0.3–1.2)
Total Protein: 7.5 g/dL (ref 6.5–8.1)

## 2022-10-08 LAB — CBC WITH DIFFERENTIAL/PLATELET
Abs Immature Granulocytes: 0.01 10*3/uL (ref 0.00–0.07)
Basophils Absolute: 0 10*3/uL (ref 0.0–0.1)
Basophils Relative: 0 %
Eosinophils Absolute: 0.1 10*3/uL (ref 0.0–0.5)
Eosinophils Relative: 1 %
HCT: 32.5 % — ABNORMAL LOW (ref 36.0–46.0)
Hemoglobin: 10.2 g/dL — ABNORMAL LOW (ref 12.0–15.0)
Immature Granulocytes: 0 %
Lymphocytes Relative: 30 %
Lymphs Abs: 1.4 10*3/uL (ref 0.7–4.0)
MCH: 28.5 pg (ref 26.0–34.0)
MCHC: 31.4 g/dL (ref 30.0–36.0)
MCV: 90.8 fL (ref 80.0–100.0)
Monocytes Absolute: 0.4 10*3/uL (ref 0.1–1.0)
Monocytes Relative: 9 %
Neutro Abs: 2.8 10*3/uL (ref 1.7–7.7)
Neutrophils Relative %: 60 %
Platelets: 233 10*3/uL (ref 150–400)
RBC: 3.58 MIL/uL — ABNORMAL LOW (ref 3.87–5.11)
RDW: 14.5 % (ref 11.5–15.5)
WBC: 4.6 10*3/uL (ref 4.0–10.5)
nRBC: 0 % (ref 0.0–0.2)

## 2022-10-08 MED ORDER — SODIUM CHLORIDE 0.9% FLUSH
10.0000 mL | Freq: Once | INTRAVENOUS | Status: AC
  Administered 2022-10-08: 10 mL

## 2022-10-08 MED ORDER — HEPARIN SOD (PORK) LOCK FLUSH 100 UNIT/ML IV SOLN
500.0000 [IU] | Freq: Once | INTRAVENOUS | Status: AC
  Administered 2022-10-08: 500 [IU]

## 2022-10-08 NOTE — Assessment & Plan Note (Signed)
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 The patient denies recent history of bleeding such as epistaxis, hematuria or hematochezia. She is asymptomatic from the anemia. We will observe for now.  She does not require transfusion now. I do not recommend any further work-up at this time.   

## 2022-10-08 NOTE — Assessment & Plan Note (Signed)
I have not seen the patient for over 6 months She is not symptomatic and her examination is not worrisome for abdominal disease I will draw her labs today and recommend CT imaging for staging The patient prefers video visit due to distance from the cancer center and I will set up video visit after CT scan is available The patient wants to continue follow-up here

## 2022-10-08 NOTE — Telephone Encounter (Addendum)
-----   Message from Artis Delay sent at 10/08/2022  9:50 AM EDT ----- Her labs look good Tell family I plan to schedule phone visit on 9/19 to review CT result   Contacted daughter Brielee Ghosh with message from Dr. Bertis Ruddy as noted above and advised her that scheduling will contact them to schedule the phone appt. Ms. Schommer verbalized understanding of information.

## 2022-10-08 NOTE — Assessment & Plan Note (Signed)
This is much improved compared to previous visit.  She will continue medical management and ambulation as tolerated

## 2022-10-08 NOTE — Progress Notes (Signed)
[facility] OFFICE PROGRESS NOTE  Patient Care Team: [name], Algis Greenhouse, DO as PCP - General (Family Medicine)  ASSESSMENT & PLAN:  Uterine cancer [facility]) I have not seen the patient for over 6 months She is not symptomatic and her examination is not worrisome for abdominal disease I will draw her labs today and recommend CT imaging for staging The patient prefers video visit due to distance from the cancer center and I will set up video visit after CT scan is available The patient wants to continue follow-up here  Anemia, chronic disease This is likely anemia of chronic disease. The patient denies recent history of bleeding such as epistaxis, hematuria or hematochezia. She is asymptomatic from the anemia. We will observe for now.  She does not require transfusion now. I do not recommend any further work-up at this time.    Bilateral edema of lower extremity This is much improved compared to previous visit.  She will continue medical management and ambulation as tolerated  Orders Placed This Encounter  Procedures   CT CHEST ABDOMEN PELVIS W CONTRAST    Standing Status:   Future    Standing Expiration Date:   10/08/2023    Order Specific Question:   If indicated for the ordered procedure, I authorize the administration of contrast media per Radiology protocol    Answer:   Yes    Order Specific Question:   Does the patient have a contrast media/X-ray dye allergy?    Answer:   No    Order Specific Question:   Preferred imaging location?    Answer:   [facility]    Order Specific Question:   If indicated for the ordered procedure, I authorize the administration of oral contrast media per Radiology protocol    Answer:   Yes   Comprehensive metabolic panel    Standing Status:   Standing    Number of Occurrences:   33    Standing Expiration Date:   10/08/2023   CBC with Differential/Platelet    Standing Status:   Standing    Number of Occurrences:   22    Standing  Expiration Date:   10/08/2023    All questions were answered. The patient knows to call the clinic with any problems, questions or concerns. The total time spent in the appointment was 40 minutes encounter with patients including review of chart and various tests results, discussions about plan of care and coordination of care plan   Artis Delay, MD 10/08/2022 10:45 AM  INTERVAL HISTORY: Please see below for problem oriented charting. she returns for further follow-up.  The patient was lost to follow-up 6 months ago.  She was sent to rehab facility after hospitalization for possible bowel obstruction The patient is currently residing at home.  She is doing much better. She denies nausea, changes in bowel habits or abdominal bloating. 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  I have reviewed the past  medical history, past surgical history, social history and family history with the patient and they are unchanged from previous note.  ALLERGIES:  is allergic to hydrocodone-acetaminophen and oxycodone-acetaminophen.  MEDICATIONS:  Current Outpatient Medications  Medication Sig Dispense Refill   allopurinol (ZYLOPRIM) 300 MG tablet Take 300 mg by mouth daily.     amiodarone (PACERONE) 200 MG tablet Take 1 tablet (200 mg total) by mouth daily. 30 tablet 3   apixaban (ELIQUIS) 5 MG TABS tablet Take 1  tablet (5 mg total) by mouth 2 (two) times daily. 60 tablet 3   ascorbic acid (VITAMIN C) 250 MG tablet Take 250 mg by mouth daily.     Biotin (BIOTIN 5000) 5 MG CAPS Take 5 mg by mouth daily.     metoprolol succinate (TOPROL-XL) 25 MG 24 hr tablet Take 25 mg by mouth daily.     Multiple Vitamins-Minerals (MULTIVITAMIN WITH MINERALS) tablet Take 1 tablet by mouth at bedtime. Centrum     potassium chloride (KLOR-CON) 10 MEQ tablet Take 10 mEq by mouth daily.     simvastatin (ZOCOR) 10 MG tablet Take 10 mg by mouth daily at 6 PM.     No current facility-administered medications for this visit.    SUMMARY OF ONCOLOGIC HISTORY: Oncology History Overview Note  Outside path showed carcinosarcoma, PD-L1 positive, MSI stable   Uterine cancer (HCC)  07/30/2021 Initial Diagnosis   She was evaluated for post-menopausal bleeding. She reported having been seen a year previously and having a biopsy that was negative.  On exam in clinic, she was noted to have a mass present within the vagina.  This was thought to be a prolapsing fibroid and plan was made for exam under anesthesia, removal prolapsing fibroid with hysteroscopy and D&C   08/11/2021 Pathology Results   PD-L1 [phone]):                                             Does meet FDA-approval, Combined Positive Score (CPS): >=1   Pan-TRK IHC:                                              Tumor cells are Focally Positive for TRK protein expression.   MICROSATELLITE INSTABILITY:             MS-Stable   TUMOR MUTATION BURDEN:                10.8 Muts/Mb TMB-Low   OTHER BIOMARKERS:                              MET amplification PIK3CA amplification MDM2 amplification CCNE1 amplification   Pertinent Negative Biomarkers Evaluated by NGS: ALK ATM BARD1 BRAF BRCA1 BRCA2 CDK12 CHECK1 CHEK2 EGFR  ERBB2 ESR1 FANCL FGFR2 FGFR3 IDH1 IDH2 KIT KRAS NRAS NTRK1 NTRK2 NTRK3 PALB2 PDGFRA RAD51B RAD51C  RAD51D RAD54L RET ROS1   Comment:  The above studies are  performed and reported by [facility] [place]) as part of the [employer] (NGS) panel.  Pertinent results are summarized above.  Please see their separate reports for more detailed information  Addendum electronically signed by Dalbert Mayotte, MD on 09/03/2021 at  7:37 AM  Final Diagnosis  A.  CERVICAL MASS, EXCISION:       - CONSISTENT WITH CARCINOSARCOMA (HIGH GRADE MALIGNANT BIPHASIC TUMOR)  Electronically signed by Dalbert Mayotte, MD on 08/15/2021 at  9:53 AM  Comment    The malignant epithelial component exhibits high-grade cytomorphology and immunostaining pattern consistent with serous carcinoma to include strong positive diffuse reactivity for p53, p16, and vimentin, focal subset CK7 and CK AE1/AE3 expression, with high Ki-67 staining (approximately 60%).  Neuroendocrine differentiation is present (CD56 negative but synaptophysin positive).  The stromal component is composed of malignant mitotically active cells and heterologous elements with focal chondroid differentiation (S100 positive).  SMA appears to decorate rare tumor cells and highlights vascular structures.  The tumor cells are non-reactive for CK20, WT1, GATA3, p40 and desmin.  Block A5 is referred to [facility] [place]) to perform molecular characterization.  These findings are shared with Dr. Jamie Kato on [id number] at [phone] hours by telephone conversation. This case received prospective intradepartmental quality assurance review.   Clinical Information    Cervical mass  Gross Description    A. Cervix Received fresh labeled with the patient's name and date of birth and "cervical mass frozen section" per container is an aggregate of red-tan friable soft tissue measuring approximately 5 x 5 x 3 cm in toto.  Representative sections are frozen for an intraoperative consultation.  Representative sections are submitted as follows:  A1-A2: Frozen section remnants A3-A5: Additional sections  Time of formalin addition:  08/11/2021 8:12 AM    Intraoperative Consultation    A. 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   Results rendered by Dr. Gasper Lloyd and reported to Dr. Paralee Cancel at [facility] on 08/11/2021 at 8:06 AM.  Microscopic Description    Microscopic examination supports the above diagnosis.  Single antibody immunostains are performed to include: S100, p40, p16, p53, PAX8, CK7, CK20, vimentin, CK AE1/3, GATA3, WT1, desmin, SMA, synaptophysin, CD56, and Ki-67.  All controls are appropriately reactive. [id number], [id number], [id number], [id number], [id number], [id number] (15)          08/22/2021 Initial Diagnosis   Carcinosarcoma (HCC)   09/01/2021 Imaging   CT abdomen and pelvis There is marked enlargement of the uterus measuring 13.7 x 10 x 8 x 10.1 cm. There is inhomogeneous enhancement in the uterus consistent with malignant neoplasm.   There are enlarged lymph nodes in the retroperitoneum in the para-and paracaval regions measuring up to 12 mm in short axis. There are a few enlarged lymph nodes adjacent to the iliac vessels largest measuring 2.6 x 1.4 cm adjacent to the right external iliac vessels. Findings suggest possible metastatic lymphadenopathy. There is mild prominence of the pelvocaliceal system in the left kidney and proximal left ureter. Possibility of extrinsic compression or infiltrative process related to the uterine neoplasm in the distal course of left ureter causing mild obstruction is not excluded.   Gallbladder stone.  Diverticulosis of colon.   Other findings as described in the body of the report.   09/01/2021 Imaging   US pelvis Large central uterine ill-defined mass approximately 7.7 cm diameter enlarging uterus likely representing the biopsy-proven neoplasm, likely of endometrial origin.   Questionable intramural leiomyoma posterior mid uterus 3.1 cm diameter.   The mass extend and the adnexal regions are poorly characterized but may potentially be better evaluated by  MR imaging with and without contrast.   09/10/2021 Surgery   Pre-operative Diagnosis: carcinosarcoma of the uterus, adenopathy on imaging   Post-operative Diagnosis: same, stage  IVB carcinosarcoma of the uterus   Operation: Diagnostic laparoscopy    Surgeon: Eugene Garnet MD Operative Findings: On EUA, cervix 2 cm dilated with tumor noted within the os. Cervix otherwise normal appearing. Uterus enlarged, 12 cm, with limited mobility secondary to weight. Compression of the rectum from uterus although no direct invasion appreciated. On intra-abdominal inspection, adhesions noted between the liver and the anterior abdominal wall. Evidence of carcinomatosis involving liver surface, diaphragm, omentum, anterior abdominal wall. Surface of the uterus with obvious tumor infiltration. Small bowel free from uterus. Posteriorly, sigmoid densely adherent to the posterior uterus. Even with manipulator, unable to move the uterus anteriorly. Minimal ascites.    Given findings of stage IV disease and what would require large bowel resection and end ostomy, decision made to abort surgery in favor of neoadjuvant chemotherapy. I had discussed this as a distinct possibility when we had gotten her CT results as well as this morning prior to the surgery.    09/15/2021 Cancer Staging   Staging form: Corpus Uteri - Carcinoma and Carcinosarcoma, AJCC 8th Edition - Clinical stage from 09/15/2021: FIGO Stage IVB (cT3, cN2a, pM1) - Signed by Artis Delay, MD on 09/15/2021 Stage prefix: Initial diagnosis   09/24/2021 - 03/12/2022 Chemotherapy   Patient is on Treatment Plan : UTERINE ENDOMETRIAL Dostarlimab-gxly (500 mg) + Carboplatin (AUC 5) + Paclitaxel (175 mg/m2) q21d x 6 cycles / Dostarlimab-gxly (1000 mg) q42d x 6 cycles      10/17/2021 Procedure   Status post revision of nonfunctional right IJ port catheter, with new right IJ port catheter placed. Port is ready for use.     11/24/2021 Imaging   1. Today's study  demonstrates a positive response to therapy with partial involution of large malignant appearing uterine mass. 2. Multiple prominent borderline enlarged and mildly enlarged retroperitoneal and pelvic lymph nodes are stable to minimally decreased in size compared to the prior study, potentially metastatic. No new lymphadenopathy is noted elsewhere in the chest, abdomen or pelvis. 3. Multiple small pulmonary nodules measuring 5 mm or less in the lungs. These were incidentally imaged on the prior CT of the abdomen and pelvis 09/01/2021, and at this time are stable. The possibility of metastatic disease is not excluded, and close attention on follow-up studies is recommended. 4. Colonic diverticulosis without evidence of acute diverticulitis at this time. 5. Additional incidental findings, as above.     12/31/2021 Surgery   Robotic-assisted laparoscopic total hysterectomy with bilateral salpingo-oophorectomy, lysis of adhesions for approximately 40 minutes, cystoscopy   Findings: On EUA, moderately mobile enlarged uterus.  On intra-abdominal entry, filmy adhesions between the anterior liver and the diaphragm bilaterally.  No carcinomatosis noted on the diaphragm or liver.  Normal-appearing stomach, small bowel and omentum.  No peritoneal lesions in the lower abdominal cavity or pelvis.  No ascites.  Uterus enlarged measuring approximately 12-14 cm, bulbous at the top.  Posterior 2 cm fibroid near the fundus.  Posterior aspect of the uterus densely adherent to the colon mesentery.  Somewhat dilated fluid-filled left fallopian tube.  Normal-appearing right adnexa.  Left ovary adherent to the broad ligament and sigmoid mesentery but otherwise normal-appearing.  Significant fibrosis bilaterally of the retroperitoneum.  No obvious adenopathy appreciated. On cystoscopy, bladder dome intact, good efflux noted from bilateral ureteral orifices.   12/31/2021 Pathology Results   A. CULDESAC, POSTERIOR ADHESIONS,  EXCISION: -  Serosa and subserosal fibrous soft tissue with focal acute inflammation, reactive mesothelial hyperplasia and adhesions. -  Negative for malignancy.  B. UTERUS, CERVIX, BILATERAL TUBE AND OVAARIES, HYSTERECTOMY: - -  Carcinosarcoma (malignant mixed Mullerian tumor) with full-thickness posterior myometrial involvement with focal serosal present and extension into lower uterine segment and extensively in the cervical stroma. 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) versus focal partial limited involvement by a papillary component of the patient's known carcinosarcoma Peritoneal/Ascitic Fluid: N/A Lymphovascular Invasion: Not identified Regional Lymph Nodes: Not applicable; no lymph nodes submitted      PHYSICAL EXAMINATION: ECOG PERFORMANCE STATUS: 1 - Symptomatic but completely ambulatory  Vitals:   10/08/22 0805  BP: 136/75  Pulse: 70  Resp: 18  Temp: 97.7 F (36.5 C)  SpO2: 99%   Filed Weights   10/08/22 0805  Weight: 189 lb 6.4 oz (85.9 kg)    GENERAL:alert, no distress and comfortable SKIN: skin color, texture, turgor are normal, no rashes or significant lesions EYES: normal, Conjunctiva are  pink and non-injected, sclera clear OROPHARYNX:no exudate, no erythema and lips, buccal mucosa, and tongue normal  NECK: supple, thyroid normal size, non-tender, without nodularity LYMPH:  no palpable lymphadenopathy in the cervical, axillary or inguinal LUNGS: clear to auscultation and percussion with normal breathing effort HEART: regular rate & rhythm and no murmurs with moderate bilateral lower extremity edema ABDOMEN:abdomen soft, non-tender and normal bowel sounds Musculoskeletal:no cyanosis of digits and no clubbing  NEURO: alert & oriented x 3 with fluent speech, no focal motor/sensory deficits  LABORATORY DATA:  I have reviewed the data as listed    Component Value Date/Time   NA 140 10/08/2022 0843   K 4.0 10/08/2022 0843   CL 104 10/08/2022 0843   CO2 28 10/08/2022 0843   GLUCOSE 90 10/08/2022 0843   BUN 16 10/08/2022 0843   CREATININE 0.99 10/08/2022 0843   CREATININE 0.58 03/12/2022 0851   CALCIUM 9.2 10/08/2022 0843   PROT 7.5 10/08/2022 0843   ALBUMIN 3.7 10/08/2022 0843   AST 21 10/08/2022 0843   AST 15 03/12/2022 0851   ALT 13 10/08/2022 0843   ALT 8 03/12/2022 0851   ALKPHOS 50 10/08/2022 0843   BILITOT 0.7 10/08/2022 0843   BILITOT 0.9 03/12/2022 0851   GFRNONAA >60 10/08/2022 0843   GFRNONAA >60 03/12/2022 0851    No results found for: "SPEP", "UPEP"  Lab Results  Component Value Date   WBC 4.6 10/08/2022   NEUTROABS 2.8 10/08/2022   HGB 10.2 (L) 10/08/2022   HCT 32.5 (L) 10/08/2022   MCV 90.8 10/08/2022   PLT 233 10/08/2022      Chemistry      Component Value Date/Time   NA 140 10/08/2022 0843   K 4.0 10/08/2022 0843   CL 104 10/08/2022 0843   CO2 28 10/08/2022 0843   BUN 16 10/08/2022 0843   CREATININE 0.99 10/08/2022 0843   CREATININE 0.58 03/12/2022 0851  Component Value Date/Time   CALCIUM 9.2 10/08/2022 0843   ALKPHOS 50 10/08/2022 0843   AST 21 10/08/2022 0843   AST 15 03/12/2022 0851   ALT 13 10/08/2022 0843   ALT 8  03/12/2022 0851   BILITOT 0.7 10/08/2022 0843   BILITOT 0.9 03/12/2022 0851

## 2022-10-20 ENCOUNTER — Encounter: Payer: Self-pay | Admitting: Hematology and Oncology

## 2022-10-22 ENCOUNTER — Ambulatory Visit (HOSPITAL_BASED_OUTPATIENT_CLINIC_OR_DEPARTMENT_OTHER)
Admission: RE | Admit: 2022-10-22 | Discharge: 2022-10-22 | Disposition: A | Discharge status: Home / Self Care | Payer: Medicare HMO | Source: Ambulatory Visit | Attending: Hematology and Oncology | Admitting: Hematology and Oncology

## 2022-10-22 DIAGNOSIS — C549 Malignant neoplasm of corpus uteri, unspecified: Secondary | ICD-10-CM | POA: Diagnosis not present

## 2022-10-22 DIAGNOSIS — R918 Other nonspecific abnormal finding of lung field: Secondary | ICD-10-CM | POA: Diagnosis not present

## 2022-10-22 DIAGNOSIS — R590 Localized enlarged lymph nodes: Secondary | ICD-10-CM | POA: Diagnosis not present

## 2022-10-22 DIAGNOSIS — N858 Other specified noninflammatory disorders of uterus: Secondary | ICD-10-CM | POA: Diagnosis not present

## 2022-10-22 MED ORDER — HEPARIN SOD (PORK) LOCK FLUSH 100 UNIT/ML IV SOLN
500.0000 [IU] | Freq: Once | INTRAVENOUS | Status: AC
  Administered 2022-10-22: 500 [IU] via INTRAVENOUS

## 2022-10-22 MED ORDER — IOHEXOL 300 MG/ML  SOLN
100.0000 mL | Freq: Once | INTRAMUSCULAR | Status: AC | PRN
  Administered 2022-10-22: 100 mL via INTRAVENOUS

## 2022-10-28 DIAGNOSIS — Z008 Encounter for other general examination: Secondary | ICD-10-CM | POA: Diagnosis not present

## 2022-10-29 ENCOUNTER — Encounter: Payer: Self-pay | Admitting: Hematology and Oncology

## 2022-10-29 ENCOUNTER — Inpatient Hospital Stay: Payer: Medicare HMO | Attending: Hematology and Oncology | Admitting: Hematology and Oncology

## 2022-10-29 DIAGNOSIS — C549 Malignant neoplasm of corpus uteri, unspecified: Secondary | ICD-10-CM | POA: Diagnosis not present

## 2022-10-29 DIAGNOSIS — G620 Drug-induced polyneuropathy: Secondary | ICD-10-CM | POA: Diagnosis not present

## 2022-10-29 DIAGNOSIS — K5909 Other constipation: Secondary | ICD-10-CM | POA: Insufficient documentation

## 2022-10-29 DIAGNOSIS — Z79899 Other long term (current) drug therapy: Secondary | ICD-10-CM | POA: Diagnosis not present

## 2022-10-29 DIAGNOSIS — Z5111 Encounter for antineoplastic chemotherapy: Secondary | ICD-10-CM | POA: Insufficient documentation

## 2022-10-29 DIAGNOSIS — C55 Malignant neoplasm of uterus, part unspecified: Secondary | ICD-10-CM | POA: Diagnosis not present

## 2022-10-29 MED ORDER — PROCHLORPERAZINE MALEATE 10 MG PO TABS
10.0000 mg | ORAL_TABLET | Freq: Four times a day (QID) | ORAL | 1 refills | Status: DC | PRN
Start: 2022-11-08 — End: 2023-09-02

## 2022-10-29 MED ORDER — DEXAMETHASONE 4 MG PO TABS: ORAL_TABLET | ORAL | 6 refills | Status: DC

## 2022-10-29 MED ORDER — LIDOCAINE-PRILOCAINE 2.5-2.5 % EX CREA: TOPICAL_CREAM | CUTANEOUS | 3 refills | Status: DC

## 2022-10-29 MED ORDER — ONDANSETRON HCL 8 MG PO TABS: 8.0000 mg | ORAL_TABLET | Freq: Three times a day (TID) | ORAL | 1 refills | Status: DC | PRN

## 2022-10-29 NOTE — Progress Notes (Signed)
OFF PATHWAY REGIMEN - Uterine  No Change  Continue With Treatment as Ordered.  Original Decision Date/Time: 09/17/2021 10:18   [id number] AUC=5 IV D1 + Dostarlimab 500 mg IV D1 + Paclitaxel 175 mg/m2 IV D1 x 6 Cycles Followed by Dostarlimab 1,000 mg IV D1 q42 Days:   Cycles 1 through 6: A cycle is every 21 days:     Dostarlimab-gxly      Paclitaxel      Carboplatin    Cycles 7 and beyond: A cycle is every 42 days:     Dostarlimab-gxly   **Always confirm dose/schedule in your pharmacy ordering system**  Patient Characteristics: Carcinosarcoma, Newly Diagnosed (Clinical Staging), Nonsurgical Candidate, Symptomatic, Stage III/IV, MSS/pMMR Histology: Carcinosarcoma Therapeutic Status: Newly Diagnosed (Clinical Staging) AJCC M Category: pM1 Surgical Candidacy: Nonsurgical Candidate AJCC 8 Stage Grouping: IVB AJCC T Category: cT3 AJCC N Category: cN2 Asymptomatic or Symptomatic<= Symptomatic Microsatellite/Mismatch Repair Status: MSS/pMMR  Intent of Therapy: Curative Intent, Discussed with Patient

## 2022-10-29 NOTE — Assessment & Plan Note (Signed)
I have reviewed CT imaging findings with the patient and her daughter Unfortunately, she has relapse of disease I recommend the patient to cancel her orthopedic surgery I will get her scheduled to resume chemotherapy Due to time constraint, she can only come on Mondays We discussed potential transfer of care to a local oncologist but the patient declined I recommend resumption of chemotherapy similar to her prior treatment which she tolerated well and had good response to treatment I recommend minimum 3 cycles of treatment before repeating imaging study

## 2022-10-29 NOTE — Progress Notes (Signed)
HEMATOLOGY-ONCOLOGY ELECTRONIC VISIT PROGRESS NOTE  Patient Care Team: [name], Algis Greenhouse, DO as PCP - General (Family Medicine)  I connected with the patient via telephone conference and verified that I am speaking with the correct person using two identifiers. The patient's location is at home and I am providing care from the [facility] I discussed the limitations, risks, security and privacy concerns of performing an evaluation and management service by e-visits and the availability of in person appointments.  I also discussed with the patient that there may be a patient responsible charge related to this service. The patient expressed understanding and agreed to proceed.   ASSESSMENT & PLAN:  Uterine cancer (HCC) I have reviewed CT imaging findings with the patient and her daughter Unfortunately, she has relapse of disease I recommend the patient to cancel her orthopedic surgery I will get her scheduled to resume chemotherapy Due to time constraint, she can only come on Mondays We discussed potential transfer of care to a local oncologist but the patient declined I recommend resumption of chemotherapy similar to her prior treatment which she tolerated well and had good response to treatment I recommend minimum 3 cycles of treatment before repeating imaging study  Orders Placed This Encounter  Procedures   CBC with Differential (Cancer Center Only)    Standing Status:   Future    Standing Expiration Date:   11/09/2023   CMP (Cancer Center only)    Standing Status:   Future    Standing Expiration Date:   11/09/2023   T4    Standing Status:   Future    Standing Expiration Date:   11/09/2023   TSH    Standing Status:   Future    Standing Expiration Date:   11/09/2023   CBC with Differential (Cancer Center Only)    Standing Status:   Future    Standing Expiration Date:   11/30/2023   CMP (Cancer Center only)    Standing Status:   Future    Standing Expiration Date:   11/30/2023    CBC with Differential (Cancer Center Only)    Standing Status:   Future    Standing Expiration Date:   12/21/2023   CMP (Cancer Center only)    Standing Status:   Future    Standing Expiration Date:   12/21/2023   T4    Standing Status:   Future    Standing Expiration Date:   12/21/2023   TSH    Standing Status:   Future    Standing Expiration Date:   12/21/2023   CBC with Differential (Cancer Center Only)    Standing Status:   Future    Standing Expiration Date:   01/11/2024   CMP (Cancer Center only)    Standing Status:   Future    Standing Expiration Date:   01/11/2024   CBC with Differential (Cancer Center Only)    Standing Status:   Future    Standing Expiration Date:   02/01/2024   CMP (Cancer Center only)    Standing Status:   Future    Standing Expiration Date:   02/01/2024   CBC with Differential (Cancer Center Only)    Standing Status:   Future    Standing Expiration Date:   02/22/2024   CMP (Cancer Center only)    Standing Status:   Future    Standing Expiration Date:   02/22/2024   T4    Standing Status:   Future    Standing Expiration  Date:   02/22/2024   TSH    Standing Status:   Future    Standing Expiration Date:   02/22/2024   CBC with Differential (Cancer Center Only)    Standing Status:   Future    Standing Expiration Date:   03/14/2024   CMP (Cancer Center only)    Standing Status:   Future    Standing Expiration Date:   03/14/2024   T4    Standing Status:   Future    Standing Expiration Date:   03/14/2024   TSH    Standing Status:   Future    Standing Expiration Date:   03/14/2024   CBC with Differential (Cancer Center Only)    Standing Status:   Future    Standing Expiration Date:   04/25/2024   CMP (Cancer Center only)    Standing Status:   Future    Standing Expiration Date:   04/25/2024   T4    Standing Status:   Future    Standing Expiration Date:   04/25/2024   TSH    Standing Status:   Future    Standing Expiration Date:   04/25/2024   CBC with  Differential (Cancer Center Only)    Standing Status:   Future    Standing Expiration Date:   06/06/2024   CMP (Cancer Center only)    Standing Status:   Future    Standing Expiration Date:   06/06/2024   T4    Standing Status:   Future    Standing Expiration Date:   06/06/2024   TSH    Standing Status:   Future    Standing Expiration Date:   06/06/2024   CBC with Differential (Cancer Center Only)    Standing Status:   Future    Standing Expiration Date:   07/18/2024   CMP (Cancer Center only)    Standing Status:   Future    Standing Expiration Date:   07/18/2024   T4    Standing Status:   Future    Standing Expiration Date:   07/18/2024   TSH    Standing Status:   Future    Standing Expiration Date:   07/18/2024   CBC with Differential (Cancer Center Only)    Standing Status:   Future    Standing Expiration Date:   08/29/2024   CMP (Cancer Center only)    Standing Status:   Future    Standing Expiration Date:   08/29/2024   T4    Standing Status:   Future    Standing Expiration Date:   08/29/2024   TSH    Standing Status:   Future    Standing Expiration Date:   08/29/2024   CBC with Differential (Cancer Center Only)    Standing Status:   Future    Standing Expiration Date:   10/10/2024   CMP (Cancer Center only)    Standing Status:   Future    Standing Expiration Date:   10/10/2024   T4    Standing Status:   Future    Standing Expiration Date:   10/10/2024   TSH    Standing Status:   Future    Standing Expiration Date:   10/10/2024    INTERVAL HISTORY: Please see below for problem oriented charting. The purpose of today's discussion is to review test results from recent imaging study We discussed CT findings We discussed the role of chemotherapy I recommended the patient to cancel her orthopedic surgery  SUMMARY  OF ONCOLOGIC HISTORY: Oncology History Overview Note  Outside path showed carcinosarcoma, PD-L1 positive, MSI stable   Uterine cancer (HCC)  07/30/2021 Initial Diagnosis    She was evaluated for post-menopausal bleeding. She reported having been seen a year previously and having a biopsy that was negative.  On exam in clinic, she was noted to have a mass present within the vagina.  This was thought to be a prolapsing fibroid and plan was made for exam under anesthesia, removal prolapsing fibroid with hysteroscopy and D&C   08/11/2021 Pathology Results   PD-L1 [phone]):                                             Does meet FDA-approval, Combined Positive Score (CPS): >=1   Pan-TRK IHC:                                              Tumor cells are Focally Positive for TRK protein expression.   MICROSATELLITE INSTABILITY:             MS-Stable   TUMOR MUTATION BURDEN:                10.8 Muts/Mb TMB-Low   OTHER BIOMARKERS:                              MET amplification PIK3CA amplification MDM2 amplification CCNE1 amplification   Pertinent Negative Biomarkers Evaluated by NGS: ALK ATM BARD1 BRAF BRCA1 BRCA2 CDK12 CHECK1 CHEK2 EGFR  ERBB2 ESR1 FANCL FGFR2 FGFR3 IDH1 IDH2 KIT KRAS NRAS NTRK1 NTRK2 NTRK3 PALB2 PDGFRA RAD51B RAD51C  RAD51D RAD54L RET ROS1   Comment:  The above studies are performed and reported by [facility], [place]) as part of the [employer] (NGS) panel.  Pertinent results are summarized above.  Please see their separate reports for more detailed information  Addendum electronically signed by Dalbert Mayotte, MD on 09/03/2021 at  7:37 AM  Final Diagnosis    A.  CERVICAL MASS, EXCISION:       - CONSISTENT WITH CARCINOSARCOMA (HIGH GRADE MALIGNANT BIPHASIC TUMOR)  Electronically signed by Dalbert Mayotte, MD on 08/15/2021 at  9:53 AM  Comment    The malignant epithelial component exhibits high-grade cytomorphology and immunostaining pattern consistent with serous carcinoma to include strong positive diffuse reactivity for p53, p16, and vimentin, focal subset CK7 and CK AE1/AE3 expression, with high Ki-67 staining (approximately 60%).  Neuroendocrine  differentiation is present (CD56 negative but synaptophysin positive).  The stromal component is composed of malignant mitotically active cells and heterologous elements with focal chondroid differentiation (S100 positive).  SMA appears to decorate rare tumor cells and highlights vascular structures.  The tumor cells are non-reactive for CK20, WT1, GATA3, p40 and desmin.  Block A5 is referred to [facility], [place]) to perform molecular characterization.  These findings are shared with Dr. Jamie Kato on [id number] at [phone] hours by telephone conversation. This case received prospective intradepartmental quality assurance review.   Clinical Information    Cervical mass  Gross Description    A. Cervix Received fresh labeled with the patient's name and date of birth and "cervical mass frozen section" per container is an aggregate of red-tan  friable soft tissue measuring approximately 5 x 5 x 3 cm in toto.  Representative sections are frozen for an intraoperative consultation.  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   Results rendered by Dr. Gasper Lloyd and reported to Dr. Paralee Cancel at [facility] on 08/11/2021 at 8:06 AM.  Microscopic Description    Microscopic examination supports the above diagnosis.  Single antibody immunostains are performed to include: S100, p40, p16, p53, PAX8, CK7, CK20, vimentin, CK AE1/3, GATA3, WT1, desmin, SMA, synaptophysin, CD56, and Ki-67.  All controls are appropriately reactive. [id number], [id number], [id number], [id number], [id number], [id number] (15)          08/22/2021 Initial Diagnosis   Carcinosarcoma (HCC)   09/01/2021 Imaging   CT abdomen and pelvis There is marked enlargement of the uterus measuring 13.7 x 10 x 8 x 10.1 cm. There is inhomogeneous  enhancement in the uterus consistent with malignant neoplasm.   There are enlarged lymph nodes in the retroperitoneum in the para-and paracaval regions measuring up to 12 mm in short axis. There are a few enlarged lymph nodes adjacent to the iliac vessels largest measuring 2.6 x 1.4 cm adjacent to the right external iliac vessels. Findings suggest possible metastatic lymphadenopathy. There is mild prominence of the pelvocaliceal system in the left kidney and proximal left ureter. Possibility of extrinsic compression or infiltrative process related to the uterine neoplasm in the distal course of left ureter causing mild obstruction is not excluded.   Gallbladder stone.  Diverticulosis of colon.   Other findings as described in the body of the report.   09/01/2021 Imaging   US pelvis Large central uterine ill-defined mass approximately 7.7 cm diameter enlarging uterus likely representing the biopsy-proven neoplasm, likely of endometrial origin.   Questionable intramural leiomyoma posterior mid uterus 3.1 cm diameter.   The mass extend and the adnexal regions are poorly characterized but may potentially be better evaluated by MR imaging with and without contrast.   09/10/2021 Surgery   Pre-operative Diagnosis: carcinosarcoma of the uterus, adenopathy on imaging   Post-operative Diagnosis: same, stage IVB carcinosarcoma of the uterus   Operation: Diagnostic laparoscopy    Surgeon: Eugene Garnet MD Operative Findings: On EUA, cervix 2 cm dilated with tumor noted within the os. Cervix otherwise normal appearing. Uterus enlarged, 12 cm, with limited mobility secondary to weight. Compression of the rectum from uterus although no direct invasion appreciated. On intra-abdominal inspection, adhesions noted between the liver and the anterior abdominal wall. Evidence of carcinomatosis involving liver surface, diaphragm, omentum, anterior abdominal wall. Surface of the uterus with obvious tumor  infiltration. Small bowel free from uterus. Posteriorly, sigmoid densely adherent to the posterior uterus. Even with manipulator, unable to move the uterus anteriorly. Minimal ascites.    Given findings of stage IV disease and what would require large bowel resection and end ostomy, decision made to abort surgery in favor of neoadjuvant chemotherapy. I had discussed this as a distinct possibility when we had gotten her CT results as well as this morning prior to the surgery.    09/15/2021 Cancer Staging   Staging form: Corpus Uteri - Carcinoma and Carcinosarcoma, AJCC 8th Edition - Clinical stage from 09/15/2021: FIGO Stage IVB (cT3, cN2a, pM1) - Signed by Artis Delay, MD on 09/15/2021 Stage prefix: Initial diagnosis   09/24/2021 -  03/12/2022 Chemotherapy   Patient is on Treatment Plan : UTERINE ENDOMETRIAL Dostarlimab-gxly (500 mg) + Carboplatin (AUC 5) + Paclitaxel (175 mg/m2) q21d x 6 cycles / Dostarlimab-gxly (1000 mg) q42d x 6 cycles      10/17/2021 Procedure   Status post revision of nonfunctional right IJ port catheter, with new right IJ port catheter placed. Port is ready for use.     11/24/2021 Imaging   1. Today's study demonstrates a positive response to therapy with partial involution of large malignant appearing uterine mass. 2. Multiple prominent borderline enlarged and mildly enlarged retroperitoneal and pelvic lymph nodes are stable to minimally decreased in size compared to the prior study, potentially metastatic. No new lymphadenopathy is noted elsewhere in the chest, abdomen or pelvis. 3. Multiple small pulmonary nodules measuring 5 mm or less in the lungs. These were incidentally imaged on the prior CT of the abdomen and pelvis 09/01/2021, and at this time are stable. The possibility of metastatic disease is not excluded, and close attention on follow-up studies is recommended. 4. Colonic diverticulosis without evidence of acute diverticulitis at this time. 5. Additional incidental  findings, as above.     12/31/2021 Surgery   Robotic-assisted laparoscopic total hysterectomy with bilateral salpingo-oophorectomy, lysis of adhesions for approximately 40 minutes, cystoscopy   Findings: On EUA, moderately mobile enlarged uterus.  On intra-abdominal entry, filmy adhesions between the anterior liver and the diaphragm bilaterally.  No carcinomatosis noted on the diaphragm or liver.  Normal-appearing stomach, small bowel and omentum.  No peritoneal lesions in the lower abdominal cavity or pelvis.  No ascites.  Uterus enlarged measuring approximately 12-14 cm, bulbous at the top.  Posterior 2 cm fibroid near the fundus.  Posterior aspect of the uterus densely adherent to the colon mesentery.  Somewhat dilated fluid-filled left fallopian tube.  Normal-appearing right adnexa.  Left ovary adherent to the broad ligament and sigmoid mesentery but otherwise normal-appearing.  Significant fibrosis bilaterally of the retroperitoneum.  No obvious adenopathy appreciated. On cystoscopy, bladder dome intact, good efflux noted from bilateral ureteral orifices.   12/31/2021 Pathology Results   A. CULDESAC, POSTERIOR ADHESIONS, EXCISION: -  Serosa and subserosal fibrous soft tissue with focal acute inflammation, reactive mesothelial hyperplasia and adhesions. -  Negative for malignancy.  B. UTERUS, CERVIX, BILATERAL TUBE AND OVAARIES, HYSTERECTOMY: - -  Carcinosarcoma (malignant mixed Mullerian tumor) with full-thickness posterior myometrial involvement with focal serosal present and extension into lower uterine segment and extensively in the cervical stroma. 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 Not currently on therapy. * Tracking Code: BO * EXAM: CT CHEST, ABDOMEN, AND PELVIS WITH CONTRAST TECHNIQUE: Multidetector CT imaging of the chest, abdomen and pelvis was performed following the standard protocol during bolus administration of intravenous contrast. RADIATION DOSE REDUCTION: This exam was performed according to the departmental dose-optimization program which includes automated exposure control, adjustment of the mA and/or kV according to patient size and/or use of iterative reconstruction technique. CONTRAST:  OMNIPAQUE IOHEXOL 300 MG/ML  SOLN COMPARISON:  Multiple priors including CT April 03, 2022 and November 21, 2021 FINDINGS: CT CHEST FINDINGS Cardiovascular: Accessed right chest Port-A-Cath with tip at the superior cavoatrial junction. Normal caliber thoracic aorta. No central pulmonary embolus on this nondedicated study. Normal size heart. No significant pericardial effusion/thickening. Mediastinum/Nodes: No suspicious thyroid nodule. No pathologically enlarged mediastinal, hilar or axillary lymph nodes. Patulous  esophagus. Lungs/Pleura: Scattered bilateral pulmonary nodules are stable from prior examination. No new suspicious pulmonary nodules or masses. For reference: -pulmonary nodule in the medial right lower lobe measuring 5 mm on image 74/4 is unchanged. -3 mm right middle lobe pulmonary nodule on image 89/4 is unchanged. Musculoskeletal: No aggressive lytic or blastic lesion of bone. Multilevel degenerative change of the spine. CT ABDOMEN PELVIS FINDINGS Hepatobiliary: No suspicious hepatic lesion. Gallbladder is unremarkable. No biliary ductal dilation. Pancreas: No pancreatic ductal dilation or evidence of acute inflammation. Spleen: No splenomegaly. Adrenals/Urinary Tract: Bilateral adrenal glands appear normal. No hydronephrosis. Kidneys demonstrate symmetric enhancement. Urinary bladder is unremarkable for degree of distension. Stomach/Bowel: Radiopaque enteric contrast material traverses the splenic flexure. Stomach is unremarkable for degree of distension. No pathologic dilation of small or large bowel. No evidence of acute bowel inflammation. Vascular/Lymphatic: Normal caliber abdominal aorta. The portal, splenic and superior mesenteric veins are patent. New retroperitoneal, iliac side chain and pelvic sidewall adenopathy. For reference: -retrocaval right upper quadrant lymph node measures 10 mm in short axis on image 65/2 -aortocaval lymph node measures 18 mm in short axis on image 77/2 -left common iliac lymph node measures 2.3 cm in short axis on image 83/2 -left obturator lymph node measures 2.1 cm in short axis on image 94/2. Reproductive: Uterus is surgically absent with a new 2.6 x 2.2 cm nodule near the left vaginal cuff on image 98/2. Other: Mild mesenteric stranding similar prior. No discrete peritoneal or omental nodularity. Nonspecific subcutaneous edema. Musculoskeletal: Round sclerotic osseous lesion in the L1 vertebral body is unchanged. No new aggressive lytic or blastic lesion of bone.  Multilevel degenerative changes spine. IMPRESSION: 1. New abdominopelvic adenopathy, compatible with recurrent nodal metastatic disease. 2. New 2.6 cm nodule near the left vaginal cuff, concerning for recurrent local disease versus and adjacent nodal metastasis. 3. Stable round sclerotic osseous lesion in the L1 vertebral body. 4. Scattered bilateral pulmonary nodules are stable from prior examination. No new suspicious pulmonary nodules or masses. These results will be called to the ordering clinician or representative by the Radiologist Assistant, and communication documented in the PACS or [employer]. Electronically Signed   By: Maudry Mayhew M.D.   On: 10/22/2022 11:37      11/09/2022 -  Chemotherapy   Patient is on Treatment Plan : UTERINE ENDOMETRIAL Dostarlimab-gxly (500 mg) + Carboplatin (AUC 5) + Paclitaxel (175 mg/m2) q21d x 6 cycles / Dostarlimab-gxly (1000 mg) q42d x 6 cycles        REVIEW OF SYSTEMS:   Constitutional: Denies fevers, chills or abnormal weight loss Eyes: Denies blurriness of vision Ears, nose, mouth, throat,  and face: Denies mucositis or sore throat Respiratory: Denies cough, dyspnea or wheezes Cardiovascular: Denies palpitation, chest discomfort Gastrointestinal:  Denies nausea, heartburn or change in bowel habits Skin: Denies abnormal skin rashes Lymphatics: Denies new lymphadenopathy or easy bruising Neurological:Denies numbness, tingling or new weaknesses Behavioral/Psych: Mood is stable, no new changes  Extremities: No lower extremity edema All other systems were reviewed with the patient and are negative.  I have reviewed the past medical history, past surgical history, social history and family history with the patient and they are unchanged from previous note.  ALLERGIES:  is allergic to hydrocodone-acetaminophen and oxycodone-acetaminophen.  MEDICATIONS:  Current Outpatient Medications  Medication Sig Dispense Refill   dexamethasone (DECADRON) 4  MG tablet Take 2 tabs at the night before and 2 tab the morning of chemotherapy, every 3 weeks, by mouth x 6 cycles 24 tablet 6   allopurinol (ZYLOPRIM) 300 MG tablet Take 300 mg by mouth daily.     amiodarone (PACERONE) 200 MG tablet Take 1 tablet (200 mg total) by mouth daily. 30 tablet 3   apixaban (ELIQUIS) 5 MG TABS tablet Take 1 tablet (5 mg total) by mouth 2 (two) times daily. 60 tablet 3   ascorbic acid (VITAMIN C) 250 MG tablet Take 250 mg by mouth daily.     Biotin (BIOTIN 5000) 5 MG CAPS Take 5 mg by mouth daily.     [START ON 11/08/2022] lidocaine-prilocaine (EMLA) cream Apply to affected area once 30 g 3   metoprolol succinate (TOPROL-XL) 25 MG 24 hr tablet Take 25 mg by mouth daily.     Multiple Vitamins-Minerals (MULTIVITAMIN WITH MINERALS) tablet Take 1 tablet by mouth at bedtime. Centrum     [START ON 11/08/2022] ondansetron (ZOFRAN) 8 MG tablet Take 1 tablet (8 mg total) by mouth every 8 (eight) hours as needed for nausea or vomiting. Start on the third day after chemotherapy. 30 tablet 1   potassium chloride (KLOR-CON) 10 MEQ tablet Take 10 mEq by mouth daily.     [START ON 11/08/2022] prochlorperazine (COMPAZINE) 10 MG tablet Take 1 tablet (10 mg total) by mouth every 6 (six) hours as needed for nausea or vomiting. 30 tablet 1   simvastatin (ZOCOR) 10 MG tablet Take 10 mg by mouth daily at 6 PM.     No current facility-administered medications for this visit.    PHYSICAL EXAMINATION: ECOG PERFORMANCE STATUS: 2 - Symptomatic, <50% confined to bed  LABORATORY DATA:  I have reviewed the data as listed    Latest Ref Rng & Units 10/08/2022    8:43 AM 08/27/2022   11:10 AM 04/07/2022    4:29 AM  CMP  Glucose 70 - 99 mg/dL 90  94  77   BUN 8 - 23 mg/dL 16  15  <5   Creatinine 0.44 - 1.00 mg/dL 1.61  0.96  0.45   Sodium 135 - 145 mmol/L 140  136  135   Potassium 3.5 - 5.1 mmol/L 4.0  3.4  4.1   Chloride 98 - 111 mmol/L 104  99  100   CO2 22 - 32 mmol/L 28  27  26    Calcium  8.9 - 10.3 mg/dL 9.2  9.2  8.0   Total Protein 6.5 - 8.1 g/dL 7.5     Total Bilirubin 0.3 - 1.2 mg/dL 0.7     Alkaline Phos 38 - 126 U/L 50     AST 15 - 41 U/L 21     ALT 0 -  44 U/L 13       Lab Results  Component Value Date   WBC 4.6 10/08/2022   HGB 10.2 (L) 10/08/2022   HCT 32.5 (L) 10/08/2022   MCV 90.8 10/08/2022   PLT 233 10/08/2022   NEUTROABS 2.8 10/08/2022     RADIOGRAPHIC STUDIES: I have personally reviewed the radiological images as listed and agreed with the findings in the report. CT CHEST ABDOMEN PELVIS W CONTRAST  Result Date: 10/22/2022 CLINICAL DATA:  Endometrial cancer, monitor, high-risk. Not currently on therapy. * Tracking Code: BO * EXAM: CT CHEST, ABDOMEN, AND PELVIS WITH CONTRAST TECHNIQUE: Multidetector CT imaging of the chest, abdomen and pelvis was performed following the standard protocol during bolus administration of intravenous contrast. RADIATION DOSE REDUCTION: This exam was performed according to the departmental dose-optimization program which includes automated exposure control, adjustment of the mA and/or kV according to patient size and/or use of iterative reconstruction technique. CONTRAST:  OMNIPAQUE IOHEXOL 300 MG/ML  SOLN COMPARISON:  Multiple priors including CT April 03, 2022 and November 21, 2021 FINDINGS: CT CHEST FINDINGS Cardiovascular: Accessed right chest Port-A-Cath with tip at the superior cavoatrial junction. Normal caliber thoracic aorta. No central pulmonary embolus on this nondedicated study. Normal size heart. No significant pericardial effusion/thickening. Mediastinum/Nodes: No suspicious thyroid nodule. No pathologically enlarged mediastinal, hilar or axillary lymph nodes. Patulous esophagus. Lungs/Pleura: Scattered bilateral pulmonary nodules are stable from prior examination. No new suspicious pulmonary nodules or masses. For reference: -pulmonary nodule in the medial right lower lobe measuring 5 mm on image 74/4 is  unchanged. -3 mm right middle lobe pulmonary nodule on image 89/4 is unchanged. Musculoskeletal: No aggressive lytic or blastic lesion of bone. Multilevel degenerative change of the spine. CT ABDOMEN PELVIS FINDINGS Hepatobiliary: No suspicious hepatic lesion. Gallbladder is unremarkable. No biliary ductal dilation. Pancreas: No pancreatic ductal dilation or evidence of acute inflammation. Spleen: No splenomegaly. Adrenals/Urinary Tract: Bilateral adrenal glands appear normal. No hydronephrosis. Kidneys demonstrate symmetric enhancement. Urinary bladder is unremarkable for degree of distension. Stomach/Bowel: Radiopaque enteric contrast material traverses the splenic flexure. Stomach is unremarkable for degree of distension. No pathologic dilation of small or large bowel. No evidence of acute bowel inflammation. Vascular/Lymphatic: Normal caliber abdominal aorta. The portal, splenic and superior mesenteric veins are patent. New retroperitoneal, iliac side chain and pelvic sidewall adenopathy. For reference: -retrocaval right upper quadrant lymph node measures 10 mm in short axis on image 65/2 -aortocaval lymph node measures 18 mm in short axis on image 77/2 -left common iliac lymph node measures 2.3 cm in short axis on image 83/2 -left obturator lymph node measures 2.1 cm in short axis on image 94/2. Reproductive: Uterus is surgically absent with a new 2.6 x 2.2 cm nodule near the left vaginal cuff on image 98/2. Other: Mild mesenteric stranding similar prior. No discrete peritoneal or omental nodularity. Nonspecific subcutaneous edema. Musculoskeletal: Round sclerotic osseous lesion in the L1 vertebral body is unchanged. No new aggressive lytic or blastic lesion of bone. Multilevel degenerative changes spine. IMPRESSION: 1. New abdominopelvic adenopathy, compatible with recurrent nodal metastatic disease. 2. New 2.6 cm nodule near the left vaginal cuff, concerning for recurrent local disease versus and adjacent  nodal metastasis. 3. Stable round sclerotic osseous lesion in the L1 vertebral body. 4. Scattered bilateral pulmonary nodules are stable from prior examination. No new suspicious pulmonary nodules or masses. These results will be called to the ordering clinician or representative by the Radiologist Assistant, and communication documented in the PACS or [employer].  Electronically Signed   By: Maudry Mayhew M.D.   On: 10/22/2022 11:37    I discussed the assessment and treatment plan with the patient. The patient was provided an opportunity to ask questions and all were answered. The patient agreed with the plan and demonstrated an understanding of the instructions. The patient was advised to call back or seek an in-person evaluation if the symptoms worsen or if the condition fails to improve as anticipated.    I spent 40 minutes for the appointment reviewing test results, discuss management and coordination of care.  Artis Delay, MD 10/29/2022 1:02 PM

## 2022-10-30 ENCOUNTER — Telehealth: Payer: Self-pay | Admitting: Hematology and Oncology

## 2022-10-30 DIAGNOSIS — G620 Drug-induced polyneuropathy: Secondary | ICD-10-CM | POA: Diagnosis not present

## 2022-10-30 DIAGNOSIS — L8990 Pressure ulcer of unspecified site, unspecified stage: Secondary | ICD-10-CM | POA: Diagnosis not present

## 2022-10-30 DIAGNOSIS — M6281 Muscle weakness (generalized): Secondary | ICD-10-CM | POA: Diagnosis not present

## 2022-10-30 NOTE — Telephone Encounter (Signed)
Patient's daughter is aware of scheduled appointment times/dates

## 2022-10-31 ENCOUNTER — Other Ambulatory Visit: Payer: Self-pay

## 2022-10-31 DIAGNOSIS — G620 Drug-induced polyneuropathy: Secondary | ICD-10-CM | POA: Diagnosis not present

## 2022-10-31 DIAGNOSIS — M6281 Muscle weakness (generalized): Secondary | ICD-10-CM | POA: Diagnosis not present

## 2022-10-31 DIAGNOSIS — L8990 Pressure ulcer of unspecified site, unspecified stage: Secondary | ICD-10-CM | POA: Diagnosis not present

## 2022-11-02 NOTE — Progress Notes (Signed)
Pharmacist Chemotherapy Monitoring - Initial Assessment    Anticipated start date: 11/09/22   The following has been reviewed per standard work regarding the patient's treatment regimen: The patient's diagnosis, treatment plan and drug doses, and organ/hematologic function Lab orders and baseline tests specific to treatment regimen  The treatment plan start date, drug sequencing, and pre-medications Prior authorization status  Patient's documented medication list, including drug-drug interaction screen and prescriptions for anti-emetics and supportive care specific to the treatment regimen The drug concentrations, fluid compatibility, administration routes, and timing of the medications to be used The patient's access for treatment and lifetime cumulative dose history, if applicable  The patient's medication allergies and previous infusion related reactions, if applicable   Changes made to treatment plan:  N/A  Follow up needed:  Pending authorization for treatment    Daylene Katayama, RPH, 11/02/2022  1:03 PM

## 2022-11-04 ENCOUNTER — Encounter: Payer: Self-pay | Admitting: Radiation Oncology

## 2022-11-04 NOTE — Progress Notes (Incomplete)
Radiation Oncology         [phone] ________________________________  Name: Sarah Wu MRN: [id number]  Date: 11/04/2022  DOB: [birth year]  End of Treatment Note  Diagnosis:  The encounter diagnosis was Malignant neoplasm of body of uterus, unspecified site [facility]).   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, s/p 4 cycles neoadjuvant chemotherapy, hysterectomy, and BSO   Cancer Staging  Uterine cancer [facility]) Staging form: Corpus Uteri - Carcinoma and Carcinosarcoma, AJCC 8th Edition - Clinical stage from 09/15/2021: FIGO Stage IVB (cT3, cN2a, pM1) - Signed by Artis Delay, MD on 09/15/2021  Indication for treatment: Curative        Radiation treatment dates: 02/26/2022, 03/05/2022, 03/12/2022  Site/dose: Vaginal brachytherapy - Vagina treated with 18.00 Gy / a prescribed dose 30.00 Gy delivered in 3 Fx at 6.00 Gy/Fx  Beams/energy:  GMP Ir-192 HDR  Narrative: The patient did not complete her prescribed dose of brachytherapy. On the date of her intended 4th brachytherapy treatment, she became very shaky and lightheaded. She subsequently presented to the [facility] ED and was admitted with febrile neutropenia due to C-Diff pancolitis. Per encounter notes, attending advised her to hold off on radiation due to her acute illness. Hospital course included Fidaxomicin (10 day course completed after being discharged) and flagyl.   Plan: The patient has completed radiation treatment. The patient will return to radiation oncology clinic for routine followup in one month. I advised them to call or return sooner if they have any questions or concerns related to their recovery or treatment.  -----------------------------------  Billie Lade, PhD, MD  This document serves as a record of services personally performed by Antony Blackbird, MD. It was created on his behalf by Neena Rhymes, a trained medical scribe. The creation of this record is  based on the scribe's personal observations and the provider's statements to them. This document has been checked and approved by the attending provider.

## 2022-11-06 ENCOUNTER — Other Ambulatory Visit: Payer: Self-pay

## 2022-11-06 MED FILL — Dexamethasone Sodium Phosphate Inj 100 MG/10ML: INTRAMUSCULAR | Qty: 1 | Status: AC

## 2022-11-06 MED FILL — Fosaprepitant Dimeglumine For IV Infusion 150 MG (Base Eq): INTRAVENOUS | Qty: 5 | Status: AC

## 2022-11-08 ENCOUNTER — Ambulatory Visit: Payer: Self-pay | Admitting: Student

## 2022-11-09 ENCOUNTER — Inpatient Hospital Stay: Payer: Medicare HMO

## 2022-11-09 ENCOUNTER — Encounter: Payer: Self-pay | Admitting: Hematology and Oncology

## 2022-11-09 ENCOUNTER — Inpatient Hospital Stay: Payer: Medicare HMO | Admitting: Hematology and Oncology

## 2022-11-09 VITALS — BP 127/80 | HR 63 | Temp 98.6°F | Resp 18 | Wt 178.1 lb

## 2022-11-09 VITALS — BP 118/71 | HR 64 | Resp 16

## 2022-11-09 DIAGNOSIS — G620 Drug-induced polyneuropathy: Secondary | ICD-10-CM | POA: Diagnosis not present

## 2022-11-09 DIAGNOSIS — C549 Malignant neoplasm of corpus uteri, unspecified: Secondary | ICD-10-CM

## 2022-11-09 DIAGNOSIS — Z5111 Encounter for antineoplastic chemotherapy: Secondary | ICD-10-CM | POA: Diagnosis not present

## 2022-11-09 DIAGNOSIS — T451X5A Adverse effect of antineoplastic and immunosuppressive drugs, initial encounter: Secondary | ICD-10-CM | POA: Diagnosis not present

## 2022-11-09 DIAGNOSIS — Z79899 Other long term (current) drug therapy: Secondary | ICD-10-CM | POA: Diagnosis not present

## 2022-11-09 DIAGNOSIS — K5909 Other constipation: Secondary | ICD-10-CM | POA: Diagnosis not present

## 2022-11-09 DIAGNOSIS — C55 Malignant neoplasm of uterus, part unspecified: Secondary | ICD-10-CM | POA: Diagnosis not present

## 2022-11-09 LAB — CBC WITH DIFFERENTIAL/PLATELET
Abs Immature Granulocytes: 0.01 10*3/uL (ref 0.00–0.07)
Basophils Absolute: 0 10*3/uL (ref 0.0–0.1)
Basophils Relative: 0 %
Eosinophils Absolute: 0 10*3/uL (ref 0.0–0.5)
Eosinophils Relative: 0 %
HCT: 36 % (ref 36.0–46.0)
Hemoglobin: 11.7 g/dL — ABNORMAL LOW (ref 12.0–15.0)
Immature Granulocytes: 0 %
Lymphocytes Relative: 21 %
Lymphs Abs: 0.8 10*3/uL (ref 0.7–4.0)
MCH: 28.3 pg (ref 26.0–34.0)
MCHC: 32.5 g/dL (ref 30.0–36.0)
MCV: 87.2 fL (ref 80.0–100.0)
Monocytes Absolute: 0.1 10*3/uL (ref 0.1–1.0)
Monocytes Relative: 1 %
Neutro Abs: 3 10*3/uL (ref 1.7–7.7)
Neutrophils Relative %: 78 %
Platelets: 319 10*3/uL (ref 150–400)
RBC: 4.13 MIL/uL (ref 3.87–5.11)
RDW: 14.5 % (ref 11.5–15.5)
WBC: 3.8 10*3/uL — ABNORMAL LOW (ref 4.0–10.5)
nRBC: 0 % (ref 0.0–0.2)

## 2022-11-09 LAB — TSH: TSH: 0.648 u[IU]/mL (ref 0.350–4.500)

## 2022-11-09 LAB — COMPREHENSIVE METABOLIC PANEL
ALT: 11 U/L (ref 0–44)
AST: 19 U/L (ref 15–41)
Albumin: 4.2 g/dL (ref 3.5–5.0)
Alkaline Phosphatase: 57 U/L (ref 38–126)
Anion gap: 10 (ref 5–15)
BUN: 18 mg/dL (ref 8–23)
CO2: 29 mmol/L (ref 22–32)
Calcium: 10.3 mg/dL (ref 8.9–10.3)
Chloride: 98 mmol/L (ref 98–111)
Creatinine, Ser: 0.87 mg/dL (ref 0.44–1.00)
GFR, Estimated: >60 mL/min (ref >60)
Glucose, Bld: 131 mg/dL — ABNORMAL HIGH (ref 70–99)
Potassium: 3.9 mmol/L (ref 3.5–5.1)
Sodium: 137 mmol/L (ref 135–145)
Total Bilirubin: 0.7 mg/dL (ref 0.3–1.2)
Total Protein: 8.7 g/dL — ABNORMAL HIGH (ref 6.5–8.1)

## 2022-11-09 MED ORDER — CETIRIZINE HCL 10 MG/ML IV SOLN
10.0000 mg | Freq: Once | INTRAVENOUS | Status: AC
  Administered 2022-11-09: 10 mg via INTRAVENOUS
  Filled 2022-11-09: qty 1

## 2022-11-09 MED ORDER — SODIUM CHLORIDE 0.9 % IV SOLN: Freq: Once | INTRAVENOUS | Status: AC

## 2022-11-09 MED ORDER — FAMOTIDINE IN NACL 20-0.9 MG/50ML-% IV SOLN
20.0000 mg | Freq: Once | INTRAVENOUS | Status: AC
  Administered 2022-11-09: 20 mg via INTRAVENOUS
  Filled 2022-11-09: qty 50

## 2022-11-09 MED ORDER — SODIUM CHLORIDE 0.9 % IV SOLN
500.0000 mg | Freq: Once | INTRAVENOUS | Status: AC
  Administered 2022-11-09: 500 mg via INTRAVENOUS
  Filled 2022-11-09: qty 10

## 2022-11-09 MED ORDER — HEPARIN SOD (PORK) LOCK FLUSH 100 UNIT/ML IV SOLN
500.0000 [IU] | Freq: Once | INTRAVENOUS | Status: AC | PRN
  Administered 2022-11-09: 500 [IU]

## 2022-11-09 MED ORDER — SODIUM CHLORIDE 0.9% FLUSH
10.0000 mL | INTRAVENOUS | Status: DC | PRN
  Administered 2022-11-09: 10 mL

## 2022-11-09 MED ORDER — PALONOSETRON HCL INJECTION 0.25 MG/5ML
0.2500 mg | Freq: Once | INTRAVENOUS | Status: AC
  Administered 2022-11-09: 0.25 mg via INTRAVENOUS
  Filled 2022-11-09: qty 5

## 2022-11-09 MED ORDER — SODIUM CHLORIDE 0.9 % IV SOLN
444.5000 mg | Freq: Once | INTRAVENOUS | Status: AC
  Administered 2022-11-09: 440 mg via INTRAVENOUS
  Filled 2022-11-09: qty 44

## 2022-11-09 MED ORDER — SODIUM CHLORIDE 0.9 % IV SOLN
131.2500 mg/m2 | Freq: Once | INTRAVENOUS | Status: AC
  Administered 2022-11-09: 258 mg via INTRAVENOUS
  Filled 2022-11-09: qty 43

## 2022-11-09 MED ORDER — SODIUM CHLORIDE 0.9 % IV SOLN
150.0000 mg | Freq: Once | INTRAVENOUS | Status: AC
  Administered 2022-11-09: 150 mg via INTRAVENOUS
  Filled 2022-11-09: qty 150

## 2022-11-09 MED ORDER — SODIUM CHLORIDE 0.9 % IV SOLN
10.0000 mg | Freq: Once | INTRAVENOUS | Status: AC
  Administered 2022-11-09: 10 mg via INTRAVENOUS
  Filled 2022-11-09: qty 10

## 2022-11-09 MED ORDER — SODIUM CHLORIDE 0.9% FLUSH
10.0000 mL | Freq: Once | INTRAVENOUS | Status: AC
  Administered 2022-11-09: 10 mL

## 2022-11-09 NOTE — Assessment & Plan Note (Signed)
I plan upfront dose reduction for her I recommend she use cryotherapy

## 2022-11-09 NOTE — Progress Notes (Signed)
Per Dr. Bertis Ruddy - okay to proceed with pre-medications while awaiting lab results.

## 2022-11-09 NOTE — Patient Instructions (Signed)
[place] [facility]   Discharge Instructions: Thank you for choosing [facility] to provide your oncology and hematology care.   If you have a lab appointment with the Cancer Center, please go directly to the Cancer Center and check in at the registration area.   Wear comfortable clothing and clothing appropriate for easy access to any Portacath or PICC line.   We strive to give you quality time with your provider. You may need to reschedule your appointment if you arrive late (15 or more minutes).  Arriving late affects you and other patients whose appointments are after yours.  Also, if you miss three or more appointments without notifying the office, you may be dismissed from the clinic at the provider's discretion.      For prescription refill requests, have your pharmacy contact our office and allow 72 hours for refills to be completed.    Today you received the following chemotherapy and/or immunotherapy agents: Dostarlimab (Jemperli), Paclitaxel (Taxol), and Carboplatin       To help prevent nausea and vomiting after your treatment, we encourage you to take your nausea medication as directed.  BELOW ARE SYMPTOMS THAT SHOULD BE REPORTED IMMEDIATELY: *FEVER GREATER THAN 100.4 F (38 C) OR HIGHER *CHILLS OR SWEATING *NAUSEA AND VOMITING THAT IS NOT CONTROLLED WITH YOUR NAUSEA MEDICATION *UNUSUAL SHORTNESS OF BREATH *UNUSUAL BRUISING OR BLEEDING *URINARY PROBLEMS (pain or burning when urinating, or frequent urination) *BOWEL PROBLEMS (unusual diarrhea, constipation, pain near the anus) TENDERNESS IN MOUTH AND THROAT WITH OR WITHOUT PRESENCE OF ULCERS (sore throat, sores in mouth, or a toothache) UNUSUAL RASH, SWELLING OR PAIN  UNUSUAL VAGINAL DISCHARGE OR ITCHING   Items with * indicate a potential emergency and should be followed up as soon as possible or go to the Emergency Department if any problems should occur.  Please show the  CHEMOTHERAPY ALERT CARD or IMMUNOTHERAPY ALERT CARD at check-in to the Emergency Department and triage nurse.  Should you have questions after your visit or need to cancel or reschedule your appointment, please contact [place] [facility]  Dept: [phone]  and follow the prompts.  Office hours are 8:00 a.m. to 4:30 p.m. Monday - Friday. Please note that voicemails left after 4:00 p.m. may not be returned until the following business day.  We are closed weekends and major holidays. You have access to a nurse at all times for urgent questions. Please call the main number to the clinic Dept: [phone] and follow the prompts.   For any non-urgent questions, you may also contact your provider using MyChart. We now offer e-Visits for anyone 29 and older to request care online for non-urgent symptoms. For details visit [email or web address].   Also download the MyChart app! Go to the app store, search "MyChart", open the app, select [place], and log in with your MyChart username and password.

## 2022-11-09 NOTE — Assessment & Plan Note (Signed)
I have reviewed CT imaging findings with the patient and her daughter Unfortunately, she has relapse of disease Due to time constraint, she can only come on Mondays We discussed potential transfer of care to a local oncologist but the patient declined I recommend resumption of chemotherapy similar to her prior treatment which she tolerated well and had good response to treatment Due to previous complications from chemotherapy, I will prescribe upfront dose reduction for her I recommend minimum 3 cycles of treatment before repeating imaging study

## 2022-11-09 NOTE — Assessment & Plan Note (Signed)
She is prone to get constipated I reinforced the importance of regular laxatives

## 2022-11-09 NOTE — Progress Notes (Signed)
[facility] OFFICE PROGRESS NOTE  Patient Care Team: Sable Feil, DO as PCP - General (Family Medicine)  ASSESSMENT & PLAN:  Uterine cancer [facility]) I have reviewed CT imaging findings with the patient and her daughter Unfortunately, she has relapse of disease Due to time constraint, she can only come on Mondays We discussed potential transfer of care to a local oncologist but the patient declined I recommend resumption of chemotherapy similar to her prior treatment which she tolerated well and had good response to treatment Due to previous complications from chemotherapy, I will prescribe upfront dose reduction for her I recommend minimum 3 cycles of treatment before repeating imaging study  Peripheral neuropathy due to chemotherapy (HCC) I plan upfront dose reduction for her I recommend she use cryotherapy  Other constipation She is prone to get constipated I reinforced the importance of regular laxatives  No orders of the defined types were placed in this encounter.   All questions were answered. The patient knows to call the clinic with any problems, questions or concerns. The total time spent in the appointment was 40 minutes encounter with patients including review of chart and various tests results, discussions about plan of care and coordination of care plan   Artis Delay, MD 11/09/2022 10:46 AM  INTERVAL HISTORY: Please see below for problem oriented charting. she returns seen prior to cycle 1 of treatment She has mild intermittent chronic constipation We spent majority of our time reviewing CT imaging results and discussed plan of care I noticed she has lost some weight but the patient claims she is eating normal  REVIEW OF SYSTEMS:   Constitutional: Denies fevers, chills  Eyes: Denies blurriness of vision Ears, nose, mouth, throat, and face: Denies mucositis or sore throat Respiratory: Denies cough, dyspnea or wheezes Cardiovascular: Denies  palpitation, chest discomfort or lower extremity swelling Skin: Denies abnormal skin rashes Lymphatics: Denies new lymphadenopathy or easy bruising Neurological:Denies numbness, tingling or new weaknesses Behavioral/Psych: Mood is stable, no new changes  All other systems were reviewed with the patient and are negative.  I have reviewed the past medical history, past surgical history, social history and family history with the patient and they are unchanged from previous note.  ALLERGIES:  is allergic to hydrocodone-acetaminophen and oxycodone-acetaminophen.  MEDICATIONS:  Current Outpatient Medications  Medication Sig Dispense Refill   allopurinol (ZYLOPRIM) 300 MG tablet Take 300 mg by mouth daily.     amiodarone (PACERONE) 200 MG tablet Take 1 tablet (200 mg total) by mouth daily. 30 tablet 3   apixaban (ELIQUIS) 5 MG TABS tablet Take 1 tablet (5 mg total) by mouth 2 (two) times daily. 60 tablet 3   ascorbic acid (VITAMIN C) 250 MG tablet Take 250 mg by mouth daily.     Biotin (BIOTIN 5000) 5 MG CAPS Take 5 mg by mouth daily.     dexamethasone (DECADRON) 4 MG tablet Take 2 tabs at the night before and 2 tab the morning of chemotherapy, every 3 weeks, by mouth x 6 cycles 24 tablet 6   lidocaine-prilocaine (EMLA) cream Apply to affected area once 30 g 3   metoprolol succinate (TOPROL-XL) 25 MG 24 hr tablet Take 25 mg by mouth daily.     Multiple Vitamins-Minerals (MULTIVITAMIN WITH MINERALS) tablet Take 1 tablet by mouth at bedtime. Centrum     ondansetron (ZOFRAN) 8 MG tablet Take 1 tablet (8 mg total) by mouth every 8 (eight) hours as needed for nausea or vomiting. Start on  the third day after chemotherapy. 30 tablet 1   potassium chloride (KLOR-CON) 10 MEQ tablet Take 10 mEq by mouth daily.     prochlorperazine (COMPAZINE) 10 MG tablet Take 1 tablet (10 mg total) by mouth every 6 (six) hours as needed for nausea or vomiting. 30 tablet 1   simvastatin (ZOCOR) 10 MG tablet Take 10 mg by  mouth daily at 6 PM.     No current facility-administered medications for this visit.   Facility-Administered Medications Ordered in Other Visits  Medication Dose Route Frequency Provider Last Rate Last Admin   dexamethasone (DECADRON) 10 mg in sodium chloride 0.9 % 50 mL IVPB  10 mg Intravenous Once Bertis Ruddy, Chynna Buerkle, MD       famotidine (PEPCID) IVPB 20 mg premix  20 mg Intravenous Once Bertis Ruddy, Roxan Yamamoto, MD 200 mL/hr at 11/09/22 1038 20 mg at 11/09/22 1038   fosaprepitant (EMEND) 150 mg in sodium chloride 0.9 % 145 mL IVPB  150 mg Intravenous Once Bertis Ruddy, Krystelle Prashad, MD       heparin lock flush 100 unit/mL  500 Units Intracatheter Once PRN Bertis Ruddy, Modestine Scherzinger, MD       sodium chloride flush (NS) 0.9 % injection 10 mL  10 mL Intracatheter PRN Artis Delay, MD        SUMMARY OF ONCOLOGIC HISTORY: Oncology History Overview Note  Outside path showed carcinosarcoma, PD-L1 positive, MSI stable   Uterine cancer (HCC)  07/30/2021 Initial Diagnosis   She was evaluated for post-menopausal bleeding. She reported having been seen a year previously and having a biopsy that was negative.  On exam in clinic, she was noted to have a mass present within the vagina.  This was thought to be a prolapsing fibroid and plan was made for exam under anesthesia, removal prolapsing fibroid with hysteroscopy and D&C   08/11/2021 Pathology Results   PD-L1 [phone]):                                             Does meet FDA-approval, Combined Positive Score (CPS): >=1   Pan-TRK IHC:                                              Tumor cells are Focally Positive for TRK protein expression.   MICROSATELLITE INSTABILITY:             MS-Stable   TUMOR MUTATION BURDEN:                10.8 Muts/Mb TMB-Low   OTHER BIOMARKERS:                              MET amplification PIK3CA amplification MDM2 amplification CCNE1 amplification   Pertinent Negative Biomarkers Evaluated by NGS: ALK ATM BARD1 BRAF BRCA1 BRCA2 CDK12 CHECK1 CHEK2 EGFR  ERBB2 ESR1  FANCL FGFR2 FGFR3 IDH1 IDH2 KIT KRAS NRAS NTRK1 NTRK2 NTRK3 PALB2 PDGFRA RAD51B RAD51C  RAD51D RAD54L RET ROS1   Comment:  The above studies are performed and reported by [facility], [place]) as part of the [employer] (NGS) panel.  Pertinent results are summarized above.  Please see their separate reports for more detailed information  Addendum electronically signed by Dalbert Mayotte, MD  on 09/03/2021 at  7:37 AM  Final Diagnosis    A.  CERVICAL MASS, EXCISION:       - CONSISTENT WITH CARCINOSARCOMA (HIGH GRADE MALIGNANT BIPHASIC TUMOR)  Electronically signed by Dalbert Mayotte, MD on 08/15/2021 at  9:53 AM  Comment    The malignant epithelial component exhibits high-grade cytomorphology and immunostaining pattern consistent with serous carcinoma to include strong positive diffuse reactivity for p53, p16, and vimentin, focal subset CK7 and CK AE1/AE3 expression, with high Ki-67 staining (approximately 60%).  Neuroendocrine differentiation is present (CD56 negative but synaptophysin positive).  The stromal component is composed of malignant mitotically active cells and heterologous elements with focal chondroid differentiation (S100 positive).  SMA appears to decorate rare tumor cells and highlights vascular structures.  The tumor cells are non-reactive for CK20, WT1, GATA3, p40 and desmin.  Block A5 is referred to [facility], [place]) to perform molecular characterization.  These findings are shared with Dr. Jamie Kato on [id number] at [phone] hours by telephone conversation. This case received prospective intradepartmental quality assurance review.   Clinical Information    Cervical mass  Gross Description    A. Cervix Received fresh labeled with the patient's name and date of birth and "cervical mass frozen section" per container is an aggregate of red-tan friable soft tissue measuring approximately 5 x 5 x 3 cm in toto.  Representative sections are frozen for an intraoperative  consultation.  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   Results rendered by Dr. Gasper Lloyd and reported to Dr. Paralee Cancel at [facility] on 08/11/2021 at 8:06 AM.  Microscopic Description    Microscopic examination supports the above diagnosis.  Single antibody immunostains are performed to include: S100, p40, p16, p53, PAX8, CK7, CK20, vimentin, CK AE1/3, GATA3, WT1, desmin, SMA, synaptophysin, CD56, and Ki-67.  All controls are appropriately reactive. [id number], [id number], [id number], [id number], [id number], [id number] (15)          08/22/2021 Initial Diagnosis   Carcinosarcoma (HCC)   09/01/2021 Imaging   CT abdomen and pelvis There is marked enlargement of the uterus measuring 13.7 x 10 x 8 x 10.1 cm. There is inhomogeneous enhancement in the uterus consistent with malignant neoplasm.   There are enlarged lymph nodes in the retroperitoneum in the para-and paracaval regions measuring up to 12 mm in short axis. There are a few enlarged lymph nodes adjacent to the iliac vessels largest measuring 2.6 x 1.4 cm adjacent to the right external iliac vessels. Findings suggest possible metastatic lymphadenopathy. There is mild prominence of the pelvocaliceal system in the left kidney and proximal left ureter. Possibility of extrinsic compression or infiltrative process related to the uterine neoplasm in the distal course of left ureter causing mild obstruction is not excluded.   Gallbladder stone.  Diverticulosis of colon.   Other findings as described in the body of the report.   09/01/2021 Imaging   US pelvis Large central uterine ill-defined mass approximately 7.7 cm diameter enlarging uterus likely representing the biopsy-proven neoplasm, likely of endometrial origin.   Questionable intramural  leiomyoma posterior mid uterus 3.1 cm diameter.   The mass extend and the adnexal regions are poorly characterized but may potentially be better evaluated by MR imaging with and without contrast.   09/10/2021 Surgery   Pre-operative Diagnosis: carcinosarcoma  of the uterus, adenopathy on imaging   Post-operative Diagnosis: same, stage IVB carcinosarcoma of the uterus   Operation: Diagnostic laparoscopy    Surgeon: Eugene Garnet MD Operative Findings: On EUA, cervix 2 cm dilated with tumor noted within the os. Cervix otherwise normal appearing. Uterus enlarged, 12 cm, with limited mobility secondary to weight. Compression of the rectum from uterus although no direct invasion appreciated. On intra-abdominal inspection, adhesions noted between the liver and the anterior abdominal wall. Evidence of carcinomatosis involving liver surface, diaphragm, omentum, anterior abdominal wall. Surface of the uterus with obvious tumor infiltration. Small bowel free from uterus. Posteriorly, sigmoid densely adherent to the posterior uterus. Even with manipulator, unable to move the uterus anteriorly. Minimal ascites.    Given findings of stage IV disease and what would require large bowel resection and end ostomy, decision made to abort surgery in favor of neoadjuvant chemotherapy. I had discussed this as a distinct possibility when we had gotten her CT results as well as this morning prior to the surgery.    09/15/2021 Cancer Staging   Staging form: Corpus Uteri - Carcinoma and Carcinosarcoma, AJCC 8th Edition - Clinical stage from 09/15/2021: FIGO Stage IVB (cT3, cN2a, pM1) - Signed by Artis Delay, MD on 09/15/2021 Stage prefix: Initial diagnosis   09/24/2021 - 03/12/2022 Chemotherapy   Patient is on Treatment Plan : UTERINE ENDOMETRIAL Dostarlimab-gxly (500 mg) + Carboplatin (AUC 5) + Paclitaxel (175 mg/m2) q21d x 6 cycles / Dostarlimab-gxly (1000 mg) q42d x 6 cycles      10/17/2021 Procedure   Status post revision  of nonfunctional right IJ port catheter, with new right IJ port catheter placed. Port is ready for use.     11/24/2021 Imaging   1. Today's study demonstrates a positive response to therapy with partial involution of large malignant appearing uterine mass. 2. Multiple prominent borderline enlarged and mildly enlarged retroperitoneal and pelvic lymph nodes are stable to minimally decreased in size compared to the prior study, potentially metastatic. No new lymphadenopathy is noted elsewhere in the chest, abdomen or pelvis. 3. Multiple small pulmonary nodules measuring 5 mm or less in the lungs. These were incidentally imaged on the prior CT of the abdomen and pelvis 09/01/2021, and at this time are stable. The possibility of metastatic disease is not excluded, and close attention on follow-up studies is recommended. 4. Colonic diverticulosis without evidence of acute diverticulitis at this time. 5. Additional incidental findings, as above.     12/31/2021 Surgery   Robotic-assisted laparoscopic total hysterectomy with bilateral salpingo-oophorectomy, lysis of adhesions for approximately 40 minutes, cystoscopy   Findings: On EUA, moderately mobile enlarged uterus.  On intra-abdominal entry, filmy adhesions between the anterior liver and the diaphragm bilaterally.  No carcinomatosis noted on the diaphragm or liver.  Normal-appearing stomach, small bowel and omentum.  No peritoneal lesions in the lower abdominal cavity or pelvis.  No ascites.  Uterus enlarged measuring approximately 12-14 cm, bulbous at the top.  Posterior 2 cm fibroid near the fundus.  Posterior aspect of the uterus densely adherent to the colon mesentery.  Somewhat dilated fluid-filled left fallopian tube.  Normal-appearing right adnexa.  Left ovary adherent to the broad ligament and sigmoid mesentery but otherwise normal-appearing.  Significant fibrosis bilaterally of the retroperitoneum.  No obvious adenopathy appreciated. On  cystoscopy, bladder dome intact, good efflux noted from bilateral ureteral orifices.   12/31/2021 Pathology Results   A. CULDESAC, POSTERIOR ADHESIONS, EXCISION: -  Serosa and subserosal fibrous soft tissue with focal acute  inflammation, reactive mesothelial hyperplasia and adhesions. -  Negative for malignancy.  B. UTERUS, CERVIX, BILATERAL TUBE AND OVAARIES, HYSTERECTOMY: - -  Carcinosarcoma (malignant mixed Mullerian tumor) with full-thickness posterior myometrial involvement with focal serosal present and extension into lower uterine segment and extensively in the cervical stroma. 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 Not currently on therapy. * Tracking Code: BO * EXAM: CT CHEST, ABDOMEN, AND PELVIS WITH CONTRAST TECHNIQUE: Multidetector CT  imaging of the chest, abdomen and pelvis was performed following the standard protocol during bolus administration of intravenous contrast. RADIATION DOSE REDUCTION: This exam was performed according to the departmental dose-optimization program which includes automated exposure control, adjustment of the mA and/or kV according to patient size and/or use of iterative reconstruction technique. CONTRAST:  OMNIPAQUE IOHEXOL 300 MG/ML  SOLN COMPARISON:  Multiple priors including CT April 03, 2022 and November 21, 2021 FINDINGS: CT CHEST FINDINGS Cardiovascular: Accessed right chest Port-A-Cath with tip at the superior cavoatrial junction. Normal caliber thoracic aorta. No central pulmonary embolus on this nondedicated study. Normal size heart. No significant pericardial effusion/thickening. Mediastinum/Nodes: No suspicious thyroid nodule. No pathologically enlarged mediastinal, hilar or axillary lymph nodes. Patulous esophagus. Lungs/Pleura: Scattered bilateral pulmonary nodules are stable from prior examination. No new suspicious pulmonary nodules or masses. For reference: -pulmonary nodule in the medial right lower lobe measuring 5 mm on image 74/4 is unchanged. -3 mm right middle lobe pulmonary nodule on image 89/4 is unchanged. Musculoskeletal: No aggressive lytic or blastic lesion of bone. Multilevel degenerative change of the spine. CT ABDOMEN PELVIS FINDINGS Hepatobiliary: No suspicious hepatic lesion. Gallbladder is unremarkable. No biliary ductal dilation. Pancreas: No pancreatic ductal dilation or evidence of acute inflammation. Spleen: No splenomegaly. Adrenals/Urinary Tract: Bilateral adrenal glands appear normal. No hydronephrosis. Kidneys demonstrate symmetric enhancement. Urinary bladder is unremarkable for degree of distension. Stomach/Bowel: Radiopaque enteric contrast material traverses the splenic flexure. Stomach is unremarkable for degree of distension. No pathologic dilation of small or  large bowel. No evidence of acute bowel inflammation. Vascular/Lymphatic: Normal caliber abdominal aorta. The portal, splenic and superior mesenteric veins are patent. New retroperitoneal, iliac side chain and pelvic sidewall adenopathy. For reference: -retrocaval right upper quadrant lymph node measures 10 mm in short axis on image 65/2 -aortocaval lymph node measures 18 mm in short axis on image 77/2 -left common iliac lymph node measures 2.3 cm in short axis on image 83/2 -left obturator lymph node measures 2.1 cm in short axis on image 94/2. Reproductive: Uterus is surgically absent with  a new 2.6 x 2.2 cm nodule near the left vaginal cuff on image 98/2. Other: Mild mesenteric stranding similar prior. No discrete peritoneal or omental nodularity. Nonspecific subcutaneous edema. Musculoskeletal: Round sclerotic osseous lesion in the L1 vertebral body is unchanged. No new aggressive lytic or blastic lesion of bone. Multilevel degenerative changes spine. IMPRESSION: 1. New abdominopelvic adenopathy, compatible with recurrent nodal metastatic disease. 2. New 2.6 cm nodule near the left vaginal cuff, concerning for recurrent local disease versus and adjacent nodal metastasis. 3. Stable round sclerotic osseous lesion in the L1 vertebral body. 4. Scattered bilateral pulmonary nodules are stable from prior examination. No new suspicious pulmonary nodules or masses. These results will be called to the ordering clinician or representative by the Radiologist Assistant, and communication documented in the PACS or [employer]. Electronically Signed   By: Maudry Mayhew M.D.   On: 10/22/2022 11:37      11/09/2022 -  Chemotherapy   Patient is on Treatment Plan : UTERINE ENDOMETRIAL Dostarlimab-gxly (500 mg) + Carboplatin (AUC 5) + Paclitaxel (175 mg/m2) q21d x 6 cycles / Dostarlimab-gxly (1000 mg) q42d x 6 cycles        PHYSICAL EXAMINATION: ECOG PERFORMANCE STATUS: 1 - Symptomatic but completely  ambulatory  Vitals:   11/09/22 0953  BP: 127/80  Pulse: 63  Resp: 18  Temp: 98.6 F (37 C)  SpO2: 98%   Filed Weights   11/09/22 0953  Weight: 178 lb 2 oz (80.8 kg)    GENERAL:alert, no distress and comfortable  LABORATORY DATA:  I have reviewed the data as listed    Component Value Date/Time   NA 137 11/09/2022 0924   K 3.9 11/09/2022 0924   CL 98 11/09/2022 0924   CO2 29 11/09/2022 0924   GLUCOSE 131 (H) 11/09/2022 0924   BUN 18 11/09/2022 0924   CREATININE 0.87 11/09/2022 0924   CREATININE 0.58 03/12/2022 0851   CALCIUM 10.3 11/09/2022 0924   PROT 8.7 (H) 11/09/2022 0924   ALBUMIN 4.2 11/09/2022 0924   AST 19 11/09/2022 0924   AST 15 03/12/2022 0851   ALT 11 11/09/2022 0924   ALT 8 03/12/2022 0851   ALKPHOS 57 11/09/2022 0924   BILITOT 0.7 11/09/2022 0924   BILITOT 0.9 03/12/2022 0851   GFRNONAA >60 11/09/2022 0924   GFRNONAA >60 03/12/2022 0851    No results found for: "SPEP", "UPEP"  Lab Results  Component Value Date   WBC 3.8 (L) 11/09/2022   NEUTROABS 3.0 11/09/2022   HGB 11.7 (L) 11/09/2022   HCT 36.0 11/09/2022   MCV 87.2 11/09/2022   PLT 319 11/09/2022      Chemistry      Component Value Date/Time   NA 137 11/09/2022 0924   K 3.9 11/09/2022 0924   CL 98 11/09/2022 0924   CO2 29 11/09/2022 0924   BUN 18 11/09/2022 0924   CREATININE 0.87 11/09/2022 0924   CREATININE 0.58 03/12/2022 0851      Component Value Date/Time   CALCIUM 10.3 11/09/2022 0924   ALKPHOS 57 11/09/2022 0924   AST 19 11/09/2022 0924   AST 15 03/12/2022 0851   ALT 11 11/09/2022 0924   ALT 8 03/12/2022 0851   BILITOT 0.7 11/09/2022 0924   BILITOT 0.9 03/12/2022 0851       RADIOGRAPHIC STUDIES: I have reviewed imaging studies with her and family I have personally reviewed the radiological images as listed and agreed with the findings in the report. CT CHEST ABDOMEN PELVIS W CONTRAST  Result Date: 10/22/2022 CLINICAL DATA:  Endometrial cancer, monitor,  high-risk. Not currently on therapy. * Tracking Code: BO * EXAM: CT CHEST, ABDOMEN, AND PELVIS WITH CONTRAST TECHNIQUE: Multidetector CT imaging of the chest, abdomen and pelvis was performed following the standard protocol during bolus administration of intravenous contrast. RADIATION DOSE REDUCTION: This exam was performed according to the departmental dose-optimization program which includes automated exposure control, adjustment of the mA and/or kV according to patient size and/or use of iterative reconstruction technique. CONTRAST:  OMNIPAQUE IOHEXOL 300 MG/ML  SOLN COMPARISON:  Multiple priors including CT April 03, 2022 and November 21, 2021 FINDINGS: CT CHEST FINDINGS Cardiovascular: Accessed right chest Port-A-Cath with tip at the superior cavoatrial junction. Normal caliber thoracic aorta. No central pulmonary embolus on this nondedicated study. Normal size heart. No significant pericardial effusion/thickening. Mediastinum/Nodes: No suspicious thyroid nodule. No pathologically enlarged mediastinal, hilar or axillary lymph nodes. Patulous esophagus. Lungs/Pleura: Scattered bilateral pulmonary nodules are stable from prior examination. No new suspicious pulmonary nodules or masses. For reference: -pulmonary nodule in the medial right lower lobe measuring 5 mm on image 74/4 is unchanged. -3 mm right middle lobe pulmonary nodule on image 89/4 is unchanged. Musculoskeletal: No aggressive lytic or blastic lesion of bone. Multilevel degenerative change of the spine. CT ABDOMEN PELVIS FINDINGS Hepatobiliary: No suspicious hepatic lesion. Gallbladder is unremarkable. No biliary ductal dilation. Pancreas: No pancreatic ductal dilation or evidence of acute inflammation. Spleen: No splenomegaly. Adrenals/Urinary Tract: Bilateral adrenal glands appear normal. No hydronephrosis. Kidneys demonstrate symmetric enhancement. Urinary bladder is unremarkable for degree of distension. Stomach/Bowel: Radiopaque enteric  contrast material traverses the splenic flexure. Stomach is unremarkable for degree of distension. No pathologic dilation of small or large bowel. No evidence of acute bowel inflammation. Vascular/Lymphatic: Normal caliber abdominal aorta. The portal, splenic and superior mesenteric veins are patent. New retroperitoneal, iliac side chain and pelvic sidewall adenopathy. For reference: -retrocaval right upper quadrant lymph node measures 10 mm in short axis on image 65/2 -aortocaval lymph node measures 18 mm in short axis on image 77/2 -left common iliac lymph node measures 2.3 cm in short axis on image 83/2 -left obturator lymph node measures 2.1 cm in short axis on image 94/2. Reproductive: Uterus is surgically absent with a new 2.6 x 2.2 cm nodule near the left vaginal cuff on image 98/2. Other: Mild mesenteric stranding similar prior. No discrete peritoneal or omental nodularity. Nonspecific subcutaneous edema. Musculoskeletal: Round sclerotic osseous lesion in the L1 vertebral body is unchanged. No new aggressive lytic or blastic lesion of bone. Multilevel degenerative changes spine. IMPRESSION: 1. New abdominopelvic adenopathy, compatible with recurrent nodal metastatic disease. 2. New 2.6 cm nodule near the left vaginal cuff, concerning for recurrent local disease versus and adjacent nodal metastasis. 3. Stable round sclerotic osseous lesion in the L1 vertebral body. 4. Scattered bilateral pulmonary nodules are stable from prior examination. No new suspicious pulmonary nodules or masses. These results will be called to the ordering clinician or representative by the Radiologist Assistant, and communication documented in the PACS or [employer]. Electronically Signed   By: Maudry Mayhew M.D.   On: 10/22/2022 11:37

## 2022-11-10 LAB — T4: T4, Total: 14.3 ug/dL — ABNORMAL HIGH (ref 4.5–12.0)

## 2022-11-16 DIAGNOSIS — R7303 Prediabetes: Secondary | ICD-10-CM | POA: Diagnosis not present

## 2022-11-16 DIAGNOSIS — M1A0710 Idiopathic chronic gout, right ankle and foot, without tophus (tophi): Secondary | ICD-10-CM | POA: Diagnosis not present

## 2022-11-16 DIAGNOSIS — M79672 Pain in left foot: Secondary | ICD-10-CM | POA: Diagnosis not present

## 2022-11-16 DIAGNOSIS — I10 Essential (primary) hypertension: Secondary | ICD-10-CM | POA: Diagnosis not present

## 2022-11-16 DIAGNOSIS — Z23 Encounter for immunization: Secondary | ICD-10-CM | POA: Diagnosis not present

## 2022-11-16 DIAGNOSIS — Z79899 Other long term (current) drug therapy: Secondary | ICD-10-CM | POA: Diagnosis not present

## 2022-11-16 DIAGNOSIS — C549 Malignant neoplasm of corpus uteri, unspecified: Secondary | ICD-10-CM | POA: Diagnosis not present

## 2022-11-16 DIAGNOSIS — E7800 Pure hypercholesterolemia, unspecified: Secondary | ICD-10-CM | POA: Diagnosis not present

## 2022-11-18 ENCOUNTER — Other Ambulatory Visit: Payer: Self-pay

## 2022-11-22 ENCOUNTER — Other Ambulatory Visit: Payer: Self-pay

## 2022-11-26 ENCOUNTER — Inpatient Hospital Stay (HOSPITAL_COMMUNITY): Admit: 2022-11-26 | Payer: Medicare HMO | Admitting: Orthopedic Surgery

## 2022-11-26 ENCOUNTER — Encounter (HOSPITAL_COMMUNITY): Admission: RE | Payer: Self-pay | Source: Ambulatory Visit

## 2022-11-26 SURGERY — TOTAL KNEE REVISION
Anesthesia: Spinal | Site: Knee | Laterality: Right

## 2022-11-27 MED FILL — Fosaprepitant Dimeglumine For IV Infusion 150 MG (Base Eq): INTRAVENOUS | Qty: 5 | Status: AC

## 2022-11-30 ENCOUNTER — Inpatient Hospital Stay (HOSPITAL_BASED_OUTPATIENT_CLINIC_OR_DEPARTMENT_OTHER): Payer: Medicare HMO | Admitting: Hematology and Oncology

## 2022-11-30 ENCOUNTER — Encounter: Payer: Self-pay | Admitting: Hematology and Oncology

## 2022-11-30 ENCOUNTER — Other Ambulatory Visit: Payer: Self-pay

## 2022-11-30 ENCOUNTER — Inpatient Hospital Stay: Payer: Medicare HMO

## 2022-11-30 ENCOUNTER — Inpatient Hospital Stay: Payer: Medicare HMO | Attending: Hematology and Oncology

## 2022-11-30 VITALS — BP 106/64 | HR 62 | Temp 97.7°F | Resp 18 | Ht 66.0 in | Wt 183.6 lb

## 2022-11-30 DIAGNOSIS — Z5112 Encounter for antineoplastic immunotherapy: Secondary | ICD-10-CM | POA: Diagnosis not present

## 2022-11-30 DIAGNOSIS — R600 Localized edema: Secondary | ICD-10-CM

## 2022-11-30 DIAGNOSIS — C55 Malignant neoplasm of uterus, part unspecified: Secondary | ICD-10-CM | POA: Insufficient documentation

## 2022-11-30 DIAGNOSIS — R300 Dysuria: Secondary | ICD-10-CM | POA: Insufficient documentation

## 2022-11-30 DIAGNOSIS — D638 Anemia in other chronic diseases classified elsewhere: Secondary | ICD-10-CM | POA: Diagnosis not present

## 2022-11-30 DIAGNOSIS — C549 Malignant neoplasm of corpus uteri, unspecified: Secondary | ICD-10-CM | POA: Diagnosis not present

## 2022-11-30 DIAGNOSIS — Z5111 Encounter for antineoplastic chemotherapy: Secondary | ICD-10-CM | POA: Insufficient documentation

## 2022-11-30 DIAGNOSIS — G620 Drug-induced polyneuropathy: Secondary | ICD-10-CM | POA: Diagnosis not present

## 2022-11-30 DIAGNOSIS — Z7901 Long term (current) use of anticoagulants: Secondary | ICD-10-CM | POA: Diagnosis not present

## 2022-11-30 DIAGNOSIS — D649 Anemia, unspecified: Secondary | ICD-10-CM | POA: Insufficient documentation

## 2022-11-30 DIAGNOSIS — K5909 Other constipation: Secondary | ICD-10-CM

## 2022-11-30 DIAGNOSIS — Z79899 Other long term (current) drug therapy: Secondary | ICD-10-CM | POA: Diagnosis not present

## 2022-11-30 DIAGNOSIS — Z90710 Acquired absence of both cervix and uterus: Secondary | ICD-10-CM | POA: Diagnosis not present

## 2022-11-30 DIAGNOSIS — T451X5A Adverse effect of antineoplastic and immunosuppressive drugs, initial encounter: Secondary | ICD-10-CM

## 2022-11-30 LAB — CBC WITH DIFFERENTIAL (CANCER CENTER ONLY)
Abs Immature Granulocytes: 0.02 10*3/uL (ref 0.00–0.07)
Basophils Absolute: 0 10*3/uL (ref 0.0–0.1)
Basophils Relative: 0 %
Eosinophils Absolute: 0 10*3/uL (ref 0.0–0.5)
Eosinophils Relative: 0 %
HCT: 31.6 % — ABNORMAL LOW (ref 36.0–46.0)
Hemoglobin: 10.2 g/dL — ABNORMAL LOW (ref 12.0–15.0)
Immature Granulocytes: 1 %
Lymphocytes Relative: 20 %
Lymphs Abs: 0.9 10*3/uL (ref 0.7–4.0)
MCH: 28.3 pg (ref 26.0–34.0)
MCHC: 32.3 g/dL (ref 30.0–36.0)
MCV: 87.5 fL (ref 80.0–100.0)
Monocytes Absolute: 0.1 10*3/uL (ref 0.1–1.0)
Monocytes Relative: 2 %
Neutro Abs: 3.3 10*3/uL (ref 1.7–7.7)
Neutrophils Relative %: 77 %
Platelet Count: 305 10*3/uL (ref 150–400)
RBC: 3.61 MIL/uL — ABNORMAL LOW (ref 3.87–5.11)
RDW: 15 % (ref 11.5–15.5)
WBC Count: 4.3 10*3/uL (ref 4.0–10.5)
nRBC: 0 % (ref 0.0–0.2)

## 2022-11-30 LAB — CMP (CANCER CENTER ONLY)
ALT: 14 U/L (ref 0–44)
AST: 26 U/L (ref 15–41)
Albumin: 3.6 g/dL (ref 3.5–5.0)
Alkaline Phosphatase: 46 U/L (ref 38–126)
Anion gap: 8 (ref 5–15)
BUN: 17 mg/dL (ref 8–23)
CO2: 30 mmol/L (ref 22–32)
Calcium: 9.4 mg/dL (ref 8.9–10.3)
Chloride: 103 mmol/L (ref 98–111)
Creatinine: 0.84 mg/dL (ref 0.44–1.00)
GFR, Estimated: >60 mL/min (ref >60)
Glucose, Bld: 140 mg/dL — ABNORMAL HIGH (ref 70–99)
Potassium: 3.5 mmol/L (ref 3.5–5.1)
Sodium: 141 mmol/L (ref 135–145)
Total Bilirubin: 0.5 mg/dL (ref 0.3–1.2)
Total Protein: 6.8 g/dL (ref 6.5–8.1)

## 2022-11-30 LAB — URINALYSIS, COMPLETE (UACMP) WITH MICROSCOPIC
Bacteria, UA: NONE SEEN
Bilirubin Urine: NEGATIVE
Glucose, UA: NEGATIVE mg/dL
Hgb urine dipstick: NEGATIVE
Ketones, ur: NEGATIVE mg/dL
Leukocytes,Ua: NEGATIVE
Nitrite: NEGATIVE
Protein, ur: NEGATIVE mg/dL
Specific Gravity, Urine: 1.001 — ABNORMAL LOW (ref 1.005–1.030)
pH: 6 (ref 5.0–8.0)

## 2022-11-30 MED ORDER — SODIUM CHLORIDE 0.9 % IV SOLN
131.2500 mg/m2 | Freq: Once | INTRAVENOUS | Status: AC
  Administered 2022-11-30: 258 mg via INTRAVENOUS
  Filled 2022-11-30: qty 42.26

## 2022-11-30 MED ORDER — SODIUM CHLORIDE 0.9% FLUSH
10.0000 mL | Freq: Once | INTRAVENOUS | Status: AC
  Administered 2022-11-30: 10 mL

## 2022-11-30 MED ORDER — SODIUM CHLORIDE 0.9% FLUSH
10.0000 mL | INTRAVENOUS | Status: DC | PRN
  Administered 2022-11-30: 10 mL

## 2022-11-30 MED ORDER — FAMOTIDINE IN NACL 20-0.9 MG/50ML-% IV SOLN
20.0000 mg | Freq: Once | INTRAVENOUS | Status: AC
  Administered 2022-11-30: 20 mg via INTRAVENOUS
  Filled 2022-11-30: qty 50

## 2022-11-30 MED ORDER — PALONOSETRON HCL INJECTION 0.25 MG/5ML
0.2500 mg | Freq: Once | INTRAVENOUS | Status: AC
  Administered 2022-11-30: 0.25 mg via INTRAVENOUS
  Filled 2022-11-30: qty 5

## 2022-11-30 MED ORDER — CETIRIZINE HCL 10 MG/ML IV SOLN
10.0000 mg | Freq: Once | INTRAVENOUS | Status: AC
Start: 2022-11-30 — End: 2022-11-30
  Administered 2022-11-30: 10 mg via INTRAVENOUS
  Filled 2022-11-30: qty 1

## 2022-11-30 MED ORDER — SODIUM CHLORIDE 0.9 % IV SOLN: Freq: Once | INTRAVENOUS | Status: AC

## 2022-11-30 MED ORDER — FOSAPREPITANT DIMEGLUMINE INJECTION 150 MG
150.0000 mg | Freq: Once | INTRAVENOUS | Status: AC
  Administered 2022-11-30: 150 mg via INTRAVENOUS
  Filled 2022-11-30: qty 150

## 2022-11-30 MED ORDER — SODIUM CHLORIDE 0.9 % IV SOLN
500.0000 mg | Freq: Once | INTRAVENOUS | Status: AC
  Administered 2022-11-30: 500 mg via INTRAVENOUS
  Filled 2022-11-30: qty 10

## 2022-11-30 MED ORDER — HEPARIN SOD (PORK) LOCK FLUSH 100 UNIT/ML IV SOLN
500.0000 [IU] | Freq: Once | INTRAVENOUS | Status: AC | PRN
  Administered 2022-11-30: 500 [IU]

## 2022-11-30 MED ORDER — SODIUM CHLORIDE 0.9 % IV SOLN
454.5000 mg | Freq: Once | INTRAVENOUS | Status: AC
  Administered 2022-11-30: 450 mg via INTRAVENOUS
  Filled 2022-11-30: qty 45

## 2022-11-30 MED ORDER — DEXAMETHASONE SODIUM PHOSPHATE 10 MG/ML IJ SOLN
10.0000 mg | Freq: Once | INTRAMUSCULAR | Status: AC
  Administered 2022-11-30: 10 mg via INTRAVENOUS
  Filled 2022-11-30: qty 1

## 2022-11-30 MED ORDER — FUROSEMIDE 20 MG PO TABS: 20.0000 mg | ORAL_TABLET | Freq: Every day | ORAL | 3 refills | Status: DC

## 2022-11-30 NOTE — Assessment & Plan Note (Signed)
I recommend urinalysis and urine culture today to rule out UTI We will proceed with treatment without delay She does not have leukocytosis or fever

## 2022-11-30 NOTE — Patient Instructions (Signed)
[place] [facility]   Discharge Instructions: Thank you for choosing [facility] to provide your oncology and hematology care.   If you have a lab appointment with the Cancer Center, please go directly to the Cancer Center and check in at the registration area.   Wear comfortable clothing and clothing appropriate for easy access to any Portacath or PICC line.   We strive to give you quality time with your provider. You may need to reschedule your appointment if you arrive late (15 or more minutes).  Arriving late affects you and other patients whose appointments are after yours.  Also, if you miss three or more appointments without notifying the office, you may be dismissed from the clinic at the provider's discretion.      For prescription refill requests, have your pharmacy contact our office and allow 72 hours for refills to be completed.    Today you received the following chemotherapy and/or immunotherapy agents: Dostarlimab (Jemperli), Paclitaxel (Taxol), and Carboplatin       To help prevent nausea and vomiting after your treatment, we encourage you to take your nausea medication as directed.  BELOW ARE SYMPTOMS THAT SHOULD BE REPORTED IMMEDIATELY: *FEVER GREATER THAN 100.4 F (38 C) OR HIGHER *CHILLS OR SWEATING *NAUSEA AND VOMITING THAT IS NOT CONTROLLED WITH YOUR NAUSEA MEDICATION *UNUSUAL SHORTNESS OF BREATH *UNUSUAL BRUISING OR BLEEDING *URINARY PROBLEMS (pain or burning when urinating, or frequent urination) *BOWEL PROBLEMS (unusual diarrhea, constipation, pain near the anus) TENDERNESS IN MOUTH AND THROAT WITH OR WITHOUT PRESENCE OF ULCERS (sore throat, sores in mouth, or a toothache) UNUSUAL RASH, SWELLING OR PAIN  UNUSUAL VAGINAL DISCHARGE OR ITCHING   Items with * indicate a potential emergency and should be followed up as soon as possible or go to the Emergency Department if any problems should occur.  Please show the  CHEMOTHERAPY ALERT CARD or IMMUNOTHERAPY ALERT CARD at check-in to the Emergency Department and triage nurse.  Should you have questions after your visit or need to cancel or reschedule your appointment, please contact [place] [facility]  Dept: [phone]  and follow the prompts.  Office hours are 8:00 a.m. to 4:30 p.m. Monday - Friday. Please note that voicemails left after 4:00 p.m. may not be returned until the following business day.  We are closed weekends and major holidays. You have access to a nurse at all times for urgent questions. Please call the main number to the clinic Dept: [phone] and follow the prompts.   For any non-urgent questions, you may also contact your provider using MyChart. We now offer e-Visits for anyone 29 and older to request care online for non-urgent symptoms. For details visit [email or web address].   Also download the MyChart app! Go to the app store, search "MyChart", open the app, select [place], and log in with your MyChart username and password.

## 2022-11-30 NOTE — Assessment & Plan Note (Signed)
Continue current dose of Taxol I recommend she use cryotherapy

## 2022-11-30 NOTE — Progress Notes (Signed)
[facility] OFFICE PROGRESS NOTE  Patient Care Team: Sable Feil, DO as PCP - General (Family Medicine)  ASSESSMENT & PLAN:  Uterine cancer (HCC) Overall, she tolerated treatment fairly well without major side effects She is noted to be mildly anemic but not symptomatic She had some constipation and struggle with bowel habits during treatment I noted she have gained a lot of weight and appears to be retaining fluid She has some recent dysuria that has not been formally evaluated Will proceed with chemotherapy as scheduled today I will schedule a virtual visit before her cycle 3 of therapy we will plan to repeat imaging study end of November for assessment of response to treatment  Peripheral neuropathy due to chemotherapy (HCC) Continue current dose of Taxol I recommend she use cryotherapy  Anemia, chronic disease This is likely anemia of chronic disease. The patient denies recent history of bleeding such as epistaxis, hematuria or hematochezia. She is asymptomatic from the anemia. We will observe for now.  She does not require transfusion now. I do not recommend any further work-up at this time.    Other constipation She is prone to get constipated I reinforced the importance of regular laxatives  Bilateral edema of lower extremity She has significant fluid retention I recommend furosemide daily We discussed risk of dehydration and enforced importance of oral fluid intake I recommend her daughter to call me if she is not able to get her weight down to less than 180 pounds by the end of the week  Dysuria I recommend urinalysis and urine culture today to rule out UTI We will proceed with treatment without delay She does not have leukocytosis or fever  Orders Placed This Encounter  Procedures   Urine Culture    Standing Status:   Future    Standing Expiration Date:   11/30/2023   CT CHEST ABDOMEN PELVIS W CONTRAST    Standing Status:   Future    Standing  Expiration Date:   11/30/2023    Order Specific Question:   If indicated for the ordered procedure, I authorize the administration of contrast media per Radiology protocol    Answer:   Yes    Order Specific Question:   Does the patient have a contrast media/X-ray dye allergy?    Answer:   No    Order Specific Question:   Preferred imaging location?    Answer:   [facility]    Order Specific Question:   If indicated for the ordered procedure, I authorize the administration of oral contrast media per Radiology protocol    Answer:   Yes   Urinalysis, Complete w Microscopic    Standing Status:   Future    Standing Expiration Date:   11/30/2023    All questions were answered. The patient knows to call the clinic with any problems, questions or concerns. The total time spent in the appointment was 40 minutes encounter with patients including review of chart and various tests results, discussions about plan of care and coordination of care plan   Artis Delay, MD 11/30/2022 9:58 AM  INTERVAL HISTORY: Please see below for problem oriented charting. she returns for cycle 2 of chemotherapy She has gained a lot of fluid weight and noted worsening leg swelling With recent chemotherapy, she struggle with constipation She denies worsening neuropathy She was seen by her primary care doctor recently and complained of dysuria but did not have urinalysis or urine culture done  REVIEW OF SYSTEMS:  Constitutional: Denies fevers, chills or abnormal weight loss Eyes: Denies blurriness of vision Ears, nose, mouth, throat, and face: Denies mucositis or sore throat Respiratory: Denies cough, dyspnea or wheezes Cardiovascular: Denies palpitation, chest discomfort Skin: Denies abnormal skin rashes Lymphatics: Denies new lymphadenopathy or easy bruising Behavioral/Psych: Mood is stable, no new changes  All other systems were reviewed with the patient and are negative.  I have reviewed the past  medical history, past surgical history, social history and family history with the patient and they are unchanged from previous note.  ALLERGIES:  is allergic to hydrocodone-acetaminophen and oxycodone-acetaminophen.  MEDICATIONS:  Current Outpatient Medications  Medication Sig Dispense Refill   furosemide (LASIX) 20 MG tablet Take 1 tablet (20 mg total) by mouth daily. 30 tablet 3   polyethylene glycol (MIRALAX / GLYCOLAX) 17 g packet Take 17 g by mouth daily.     allopurinol (ZYLOPRIM) 300 MG tablet Take 300 mg by mouth daily.     amiodarone (PACERONE) 200 MG tablet Take 1 tablet (200 mg total) by mouth daily. 30 tablet 3   apixaban (ELIQUIS) 5 MG TABS tablet Take 1 tablet (5 mg total) by mouth 2 (two) times daily. 60 tablet 3   ascorbic acid (VITAMIN C) 250 MG tablet Take 250 mg by mouth daily.     Biotin (BIOTIN 5000) 5 MG CAPS Take 5 mg by mouth daily.     dexamethasone (DECADRON) 4 MG tablet Take 2 tabs at the night before and 2 tab the morning of chemotherapy, every 3 weeks, by mouth x 6 cycles 24 tablet 6   lidocaine-prilocaine (EMLA) cream Apply to affected area once 30 g 3   metoprolol succinate (TOPROL-XL) 25 MG 24 hr tablet Take 25 mg by mouth daily.     Multiple Vitamins-Minerals (MULTIVITAMIN WITH MINERALS) tablet Take 1 tablet by mouth at bedtime. Centrum     ondansetron (ZOFRAN) 8 MG tablet Take 1 tablet (8 mg total) by mouth every 8 (eight) hours as needed for nausea or vomiting. Start on the third day after chemotherapy. 30 tablet 1   potassium chloride (KLOR-CON) 10 MEQ tablet Take 10 mEq by mouth daily.     prochlorperazine (COMPAZINE) 10 MG tablet Take 1 tablet (10 mg total) by mouth every 6 (six) hours as needed for nausea or vomiting. 30 tablet 1   simvastatin (ZOCOR) 10 MG tablet Take 10 mg by mouth daily at 6 PM.     No current facility-administered medications for this visit.    SUMMARY OF ONCOLOGIC HISTORY: Oncology History Overview Note  Outside path showed  carcinosarcoma, PD-L1 positive, MSI stable   Uterine cancer (HCC)  07/30/2021 Initial Diagnosis   She was evaluated for post-menopausal bleeding. She reported having been seen a year previously and having a biopsy that was negative.  On exam in clinic, she was noted to have a mass present within the vagina.  This was thought to be a prolapsing fibroid and plan was made for exam under anesthesia, removal prolapsing fibroid with hysteroscopy and D&C   08/11/2021 Pathology Results   PD-L1 [phone]):                                             Does meet FDA-approval, Combined Positive Score (CPS): >=1   Pan-TRK IHC:  Tumor cells are Focally Positive for TRK protein expression.   MICROSATELLITE INSTABILITY:             MS-Stable   TUMOR MUTATION BURDEN:                10.8 Muts/Mb TMB-Low   OTHER BIOMARKERS:                              MET amplification PIK3CA amplification MDM2 amplification CCNE1 amplification   Pertinent Negative Biomarkers Evaluated by NGS: ALK ATM BARD1 BRAF BRCA1 BRCA2 CDK12 CHECK1 CHEK2 EGFR  ERBB2 ESR1 FANCL FGFR2 FGFR3 IDH1 IDH2 KIT KRAS NRAS NTRK1 NTRK2 NTRK3 PALB2 PDGFRA RAD51B RAD51C  RAD51D RAD54L RET ROS1   Comment:  The above studies are performed and reported by [facility], [place]) as part of the [employer] (NGS) panel.  Pertinent results are summarized above.  Please see their separate reports for more detailed information  Addendum electronically signed by Dalbert Mayotte, MD on 09/03/2021 at  7:37 AM  Final Diagnosis    A.  CERVICAL MASS, EXCISION:       - CONSISTENT WITH CARCINOSARCOMA (HIGH GRADE MALIGNANT BIPHASIC TUMOR)  Electronically signed by Dalbert Mayotte, MD on 08/15/2021 at  9:53 AM  Comment    The malignant epithelial component exhibits high-grade cytomorphology and immunostaining pattern consistent with serous carcinoma to include strong positive diffuse reactivity for p53, p16, and vimentin, focal  subset CK7 and CK AE1/AE3 expression, with high Ki-67 staining (approximately 60%).  Neuroendocrine differentiation is present (CD56 negative but synaptophysin positive).  The stromal component is composed of malignant mitotically active cells and heterologous elements with focal chondroid differentiation (S100 positive).  SMA appears to decorate rare tumor cells and highlights vascular structures.  The tumor cells are non-reactive for CK20, WT1, GATA3, p40 and desmin.  Block A5 is referred to [facility], [place]) to perform molecular characterization.  These findings are shared with Dr. Jamie Kato on [id number] at [phone] hours by telephone conversation. This case received prospective intradepartmental quality assurance review.   Clinical Information    Cervical mass  Gross Description    A. Cervix Received fresh labeled with the patient's name and date of birth and "cervical mass frozen section" per container is an aggregate of red-tan friable soft tissue measuring approximately 5 x 5 x 3 cm in toto.  Representative sections are frozen for an intraoperative consultation.  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   Results rendered by Dr. Gasper Lloyd and reported to Dr. Paralee Cancel at [facility] on 08/11/2021 at 8:06 AM.  Microscopic Description    Microscopic examination supports the above diagnosis.  Single antibody immunostains are performed to include: S100, p40, p16, p53, PAX8, CK7, CK20, vimentin, CK AE1/3, GATA3, WT1, desmin, SMA, synaptophysin, CD56, and Ki-67.  All controls are appropriately reactive. [id number], [id number], [id number], [id number], [id number], [id number] (15)          08/22/2021 Initial Diagnosis   Carcinosarcoma (HCC)   09/01/2021 Imaging   CT abdomen and pelvis There  is marked enlargement of the uterus measuring 13.7 x 10 x 8 x 10.1 cm. There is inhomogeneous enhancement in the uterus consistent with malignant neoplasm.   There are enlarged lymph nodes in the  retroperitoneum in the para-and paracaval regions measuring up to 12 mm in short axis. There are a few enlarged lymph nodes adjacent to the iliac vessels largest measuring 2.6 x 1.4 cm adjacent to the right external iliac vessels. Findings suggest possible metastatic lymphadenopathy. There is mild prominence of the pelvocaliceal system in the left kidney and proximal left ureter. Possibility of extrinsic compression or infiltrative process related to the uterine neoplasm in the distal course of left ureter causing mild obstruction is not excluded.   Gallbladder stone.  Diverticulosis of colon.   Other findings as described in the body of the report.   09/01/2021 Imaging   US pelvis Large central uterine ill-defined mass approximately 7.7 cm diameter enlarging uterus likely representing the biopsy-proven neoplasm, likely of endometrial origin.   Questionable intramural leiomyoma posterior mid uterus 3.1 cm diameter.   The mass extend and the adnexal regions are poorly characterized but may potentially be better evaluated by MR imaging with and without contrast.   09/10/2021 Surgery   Pre-operative Diagnosis: carcinosarcoma of the uterus, adenopathy on imaging   Post-operative Diagnosis: same, stage IVB carcinosarcoma of the uterus   Operation: Diagnostic laparoscopy    Surgeon: Eugene Garnet MD Operative Findings: On EUA, cervix 2 cm dilated with tumor noted within the os. Cervix otherwise normal appearing. Uterus enlarged, 12 cm, with limited mobility secondary to weight. Compression of the rectum from uterus although no direct invasion appreciated. On intra-abdominal inspection, adhesions noted between the liver and the anterior abdominal wall. Evidence of carcinomatosis involving liver surface,  diaphragm, omentum, anterior abdominal wall. Surface of the uterus with obvious tumor infiltration. Small bowel free from uterus. Posteriorly, sigmoid densely adherent to the posterior uterus. Even with manipulator, unable to move the uterus anteriorly. Minimal ascites.    Given findings of stage IV disease and what would require large bowel resection and end ostomy, decision made to abort surgery in favor of neoadjuvant chemotherapy. I had discussed this as a distinct possibility when we had gotten her CT results as well as this morning prior to the surgery.    09/15/2021 Cancer Staging   Staging form: Corpus Uteri - Carcinoma and Carcinosarcoma, AJCC 8th Edition - Clinical stage from 09/15/2021: FIGO Stage IVB (cT3, cN2a, pM1) - Signed by Artis Delay, MD on 09/15/2021 Stage prefix: Initial diagnosis   09/24/2021 - 03/12/2022 Chemotherapy   Patient is on Treatment Plan : UTERINE ENDOMETRIAL Dostarlimab-gxly (500 mg) + Carboplatin (AUC 5) + Paclitaxel (175 mg/m2) q21d x 6 cycles / Dostarlimab-gxly (1000 mg) q42d x 6 cycles      10/17/2021 Procedure   Status post revision of nonfunctional right IJ port catheter, with new right IJ port catheter placed. Port is ready for use.     11/24/2021 Imaging   1. Today's study demonstrates a positive response to therapy with partial involution of large malignant appearing uterine mass. 2. Multiple prominent borderline enlarged and mildly enlarged retroperitoneal and pelvic lymph nodes are stable to minimally decreased in size compared to the prior study, potentially metastatic. No new lymphadenopathy is noted elsewhere in the chest, abdomen or pelvis. 3. Multiple small pulmonary nodules measuring 5 mm or less in the lungs. These were incidentally imaged on the prior CT of the abdomen and pelvis 09/01/2021, and at this time are stable. The possibility of metastatic disease is not excluded, and close attention on follow-up studies is recommended. 4. Colonic diverticulosis  without evidence of acute diverticulitis at this time. 5. Additional incidental findings, as above.  12/31/2021 Surgery   Robotic-assisted laparoscopic total hysterectomy with bilateral salpingo-oophorectomy, lysis of adhesions for approximately 40 minutes, cystoscopy   Findings: On EUA, moderately mobile enlarged uterus.  On intra-abdominal entry, filmy adhesions between the anterior liver and the diaphragm bilaterally.  No carcinomatosis noted on the diaphragm or liver.  Normal-appearing stomach, small bowel and omentum.  No peritoneal lesions in the lower abdominal cavity or pelvis.  No ascites.  Uterus enlarged measuring approximately 12-14 cm, bulbous at the top.  Posterior 2 cm fibroid near the fundus.  Posterior aspect of the uterus densely adherent to the colon mesentery.  Somewhat dilated fluid-filled left fallopian tube.  Normal-appearing right adnexa.  Left ovary adherent to the broad ligament and sigmoid mesentery but otherwise normal-appearing.  Significant fibrosis bilaterally of the retroperitoneum.  No obvious adenopathy appreciated. On cystoscopy, bladder dome intact, good efflux noted from bilateral ureteral orifices.   12/31/2021 Pathology Results   A. CULDESAC, POSTERIOR ADHESIONS, EXCISION: -  Serosa and subserosal fibrous soft tissue with focal acute inflammation, reactive mesothelial hyperplasia and adhesions. -  Negative for malignancy.  B. UTERUS, CERVIX, BILATERAL TUBE AND OVAARIES, HYSTERECTOMY: - -  Carcinosarcoma (malignant mixed Mullerian tumor) with full-thickness posterior myometrial involvement with focal serosal present and extension into lower uterine segment and extensively in the cervical stroma. 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 Not currently on therapy. * Tracking Code: BO * EXAM: CT CHEST, ABDOMEN, AND PELVIS WITH CONTRAST TECHNIQUE: Multidetector CT imaging of the chest, abdomen and pelvis was performed following the standard protocol during bolus administration of intravenous contrast. RADIATION DOSE REDUCTION: This exam was performed according to the departmental dose-optimization program which includes automated exposure control, adjustment of the mA and/or kV according to patient size and/or use of iterative reconstruction technique. CONTRAST:  OMNIPAQUE IOHEXOL 300 MG/ML  SOLN COMPARISON:  Multiple priors including CT April 03, 2022 and November 21, 2021 FINDINGS: CT CHEST FINDINGS Cardiovascular: Accessed right chest Port-A-Cath with tip at the superior cavoatrial junction. Normal caliber thoracic aorta. No central pulmonary embolus on this nondedicated study. Normal size heart. No significant pericardial effusion/thickening. Mediastinum/Nodes: No suspicious thyroid nodule. No pathologically  enlarged mediastinal, hilar or axillary lymph nodes. Patulous esophagus. Lungs/Pleura: Scattered bilateral pulmonary nodules are stable from prior examination. No new suspicious pulmonary nodules or masses. For reference: -pulmonary nodule in the medial right lower lobe measuring 5 mm on image 74/4 is unchanged. -3 mm right middle lobe pulmonary nodule on image 89/4 is unchanged. Musculoskeletal: No aggressive lytic or  blastic lesion of bone. Multilevel degenerative change of the spine. CT ABDOMEN PELVIS FINDINGS Hepatobiliary: No suspicious hepatic lesion. Gallbladder is unremarkable. No biliary ductal dilation. Pancreas: No pancreatic ductal dilation or evidence of acute inflammation. Spleen: No splenomegaly. Adrenals/Urinary Tract: Bilateral adrenal glands appear normal. No hydronephrosis. Kidneys demonstrate symmetric enhancement. Urinary bladder is unremarkable for degree of distension. Stomach/Bowel: Radiopaque enteric contrast material traverses the splenic flexure. Stomach is unremarkable for degree of distension. No pathologic dilation of small or large bowel. No evidence of acute bowel inflammation. Vascular/Lymphatic: Normal caliber abdominal aorta. The portal, splenic and superior mesenteric veins are patent. New retroperitoneal, iliac side chain and pelvic sidewall adenopathy. For reference: -retrocaval right upper quadrant lymph node measures 10 mm in short axis on image 65/2 -aortocaval lymph node measures 18 mm in short axis on image 77/2 -left common iliac lymph node measures 2.3 cm in short axis on image 83/2 -left obturator lymph node measures 2.1 cm in short axis on image 94/2. Reproductive: Uterus is surgically absent with a new 2.6 x 2.2 cm nodule near the left vaginal cuff on image 98/2. Other: Mild mesenteric stranding similar prior. No discrete peritoneal or omental nodularity. Nonspecific subcutaneous edema. Musculoskeletal: Round sclerotic osseous lesion in the L1 vertebral body is  unchanged. No new aggressive lytic or blastic lesion of bone. Multilevel degenerative changes spine. IMPRESSION: 1. New abdominopelvic adenopathy, compatible with recurrent nodal metastatic disease. 2. New 2.6 cm nodule near the left vaginal cuff, concerning for recurrent local disease versus and adjacent nodal metastasis. 3. Stable round sclerotic osseous lesion in the L1 vertebral body. 4. Scattered bilateral pulmonary nodules are stable from prior examination. No new suspicious pulmonary nodules or masses. These results will be called to the ordering clinician or representative by the Radiologist Assistant, and communication documented in the PACS or [employer]. Electronically Signed   By: Maudry Mayhew M.D.   On: 10/22/2022 11:37      11/09/2022 -  Chemotherapy   Patient is on Treatment Plan : UTERINE ENDOMETRIAL Dostarlimab-gxly (500 mg) + Carboplatin (AUC 5) + Paclitaxel (175 mg/m2) q21d x 6 cycles / Dostarlimab-gxly (1000 mg) q42d x 6 cycles        PHYSICAL EXAMINATION: ECOG PERFORMANCE STATUS: 2 - Symptomatic, <50% confined to bed  Vitals:   11/30/22 0939  BP: 106/64  Pulse: 62  Resp: 18  Temp: 97.7 F (36.5 C)  SpO2: 98%   Filed Weights   11/30/22 0939  Weight: 183 lb 9.6 oz (83.3 kg)    GENERAL:alert, no distress and comfortable SKIN: skin color, texture, turgor are normal, no rashes or significant lesions EYES: normal, Conjunctiva are pink and non-injected, sclera clear OROPHARYNX:no exudate, no erythema and lips, buccal mucosa, and tongue normal  NECK: supple, thyroid normal size, non-tender, without nodularity LYMPH:  no palpable lymphadenopathy in the cervical, axillary or inguinal LUNGS: clear to auscultation and percussion with normal breathing effort HEART: She has gross bilateral lower extremity edema ABDOMEN:abdomen soft, non-tender and normal bowel sounds Musculoskeletal:no cyanosis of digits and no clubbing  NEURO: alert & oriented x 3 with fluent speech,  no focal motor/sensory deficits  LABORATORY DATA:  I have reviewed the data as listed    Component Value Date/Time   NA 137 11/09/2022 0924   K 3.9 11/09/2022 0924   CL 98 11/09/2022 0924   CO2 29 11/09/2022 0924   GLUCOSE 131 (H) 11/09/2022 0924   BUN 18 11/09/2022 0924   CREATININE 0.87 11/09/2022 0924   CREATININE 0.58  03/12/2022 0851   CALCIUM 10.3 11/09/2022 0924   PROT 8.7 (H) 11/09/2022 0924   ALBUMIN 4.2 11/09/2022 0924   AST 19 11/09/2022 0924   AST 15 03/12/2022 0851   ALT 11 11/09/2022 0924   ALT 8 03/12/2022 0851   ALKPHOS 57 11/09/2022 0924   BILITOT 0.7 11/09/2022 0924   BILITOT 0.9 03/12/2022 0851   GFRNONAA >60 11/09/2022 0924   GFRNONAA >60 03/12/2022 0851    No results found for: "SPEP", "UPEP"  Lab Results  Component Value Date   WBC 4.3 11/30/2022   NEUTROABS 3.3 11/30/2022   HGB 10.2 (L) 11/30/2022   HCT 31.6 (L) 11/30/2022   MCV 87.5 11/30/2022   PLT 305 11/30/2022      Chemistry      Component Value Date/Time   NA 137 11/09/2022 0924   K 3.9 11/09/2022 0924   CL 98 11/09/2022 0924   CO2 29 11/09/2022 0924   BUN 18 11/09/2022 0924   CREATININE 0.87 11/09/2022 0924   CREATININE 0.58 03/12/2022 0851      Component Value Date/Time   CALCIUM 10.3 11/09/2022 0924   ALKPHOS 57 11/09/2022 0924   AST 19 11/09/2022 0924   AST 15 03/12/2022 0851   ALT 11 11/09/2022 0924   ALT 8 03/12/2022 0851   BILITOT 0.7 11/09/2022 0924   BILITOT 0.9 03/12/2022 0851

## 2022-11-30 NOTE — Assessment & Plan Note (Signed)
Overall, she tolerated treatment fairly well without major side effects She is noted to be mildly anemic but not symptomatic She had some constipation and struggle with bowel habits during treatment I noted she have gained a lot of weight and appears to be retaining fluid She has some recent dysuria that has not been formally evaluated Will proceed with chemotherapy as scheduled today I will schedule a virtual visit before her cycle 3 of therapy we will plan to repeat imaging study end of November for assessment of response to treatment

## 2022-11-30 NOTE — Assessment & Plan Note (Signed)
She has significant fluid retention I recommend furosemide daily We discussed risk of dehydration and enforced importance of oral fluid intake I recommend her daughter to call me if she is not able to get her weight down to less than 180 pounds by the end of the week

## 2022-11-30 NOTE — Progress Notes (Signed)
Per Dr. Bertis Ruddy - okay to proceed with treatment with pending urinalysis and urine culture results.

## 2022-11-30 NOTE — Assessment & Plan Note (Signed)
She is prone to get constipated I reinforced the importance of regular laxatives

## 2022-11-30 NOTE — Assessment & Plan Note (Signed)
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 The patient denies recent history of bleeding such as epistaxis, hematuria or hematochezia. She is asymptomatic from the anemia. We will observe for now.  She does not require transfusion now. I do not recommend any further work-up at this time.   

## 2022-12-01 ENCOUNTER — Telehealth: Payer: Self-pay

## 2022-12-01 ENCOUNTER — Other Ambulatory Visit: Payer: Self-pay | Admitting: Hematology and Oncology

## 2022-12-01 LAB — URINE CULTURE: Culture: NO GROWTH

## 2022-12-01 MED ORDER — ESTRADIOL 0.1 MG/GM VA CREA: 1.0000 | TOPICAL_CREAM | Freq: Every day | VAGINAL | 12 refills | Status: DC

## 2022-12-01 NOTE — Telephone Encounter (Signed)
Called and given below message to daughter Sarah Wu. She verbalized understanding. They would like the topical estrogen cream sent to [facility] in [place].  Sarah Wu said that on the phone visit on 11-8 they would like to talk about after treatment is completed they would be interested in a pill treatment if that is a possibly.

## 2022-12-01 NOTE — Telephone Encounter (Signed)
-----   Message from Artis Delay sent at 12/01/2022  8:26 AM EDT ----- Pls call her UA and UCx showed no infection Her symptom could be due to vaginal atrophy If she is interested we can prescribe topical estrogen cream

## 2022-12-03 ENCOUNTER — Telehealth: Payer: Self-pay

## 2022-12-03 NOTE — Telephone Encounter (Signed)
Part of the swelling could be due to sedentary lifestyle I recommend continue lasix until 170 pounds, then change to every other day Elevate legs, agree with heat pad and tylenol prn

## 2022-12-03 NOTE — Telephone Encounter (Signed)
Returned call to her daughter, Sarah Wu. Sarah Wu's weight is down to 175.4 today, on Monday she was 182 lbs. She is taking lasix daily. Still complaining of some constipation even with having bm's. She is having multiple bm's per day. Taking miralax daily and added senokot 1 daily yesterday.   She is still having swelling in her feet. Instructed daughter to have her Mom elevate her feet to see if that helps. Complaining of knee pain and taking tylenol prn. Instructed to try heating pad prn.  Sarah Wu is asking if Dr. Bertis Ruddy has any more recommendations?

## 2022-12-03 NOTE — Telephone Encounter (Signed)
Called and given daughter below message to daughter. She verbalized understanding and will call the office back for questions.

## 2022-12-04 ENCOUNTER — Other Ambulatory Visit: Payer: Self-pay | Admitting: Hematology

## 2022-12-04 ENCOUNTER — Ambulatory Visit: Payer: Medicare HMO | Admitting: Hematology and Oncology

## 2022-12-04 MED ORDER — GABAPENTIN 100 MG PO CAPS: 100.0000 mg | ORAL_CAPSULE | Freq: Three times a day (TID) | ORAL | 0 refills | Status: DC

## 2022-12-04 MED ORDER — GABAPENTIN 100 MG PO CAPS: 100.0000 mg | ORAL_CAPSULE | Freq: Two times a day (BID) | ORAL | 0 refills | Status: DC

## 2022-12-04 NOTE — Progress Notes (Signed)
Pt's daughter called to report tingling and numbness on her mother's hands and feet, it has been bothersome over the past few days.  Patient is on active chemotherapy including carboplatin and Taxol.  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  I called in Neurontin 100 mg twice daily, and she can titrate as needed.  Patient does not drive.  Side effects were reviewed with her daughter.  She voiced good understanding and agreed with the plan.  She will call Dr. Bertis Ruddy Monday for follow-up.  Malachy Mood MD 12/04/2022

## 2022-12-18 ENCOUNTER — Encounter: Payer: Self-pay | Admitting: Hematology and Oncology

## 2022-12-18 ENCOUNTER — Inpatient Hospital Stay: Payer: Medicare HMO | Attending: Hematology and Oncology | Admitting: Hematology and Oncology

## 2022-12-18 VITALS — Ht 66.0 in | Wt 169.4 lb

## 2022-12-18 DIAGNOSIS — C55 Malignant neoplasm of uterus, part unspecified: Secondary | ICD-10-CM | POA: Insufficient documentation

## 2022-12-18 DIAGNOSIS — R300 Dysuria: Secondary | ICD-10-CM

## 2022-12-18 DIAGNOSIS — Z79899 Other long term (current) drug therapy: Secondary | ICD-10-CM | POA: Insufficient documentation

## 2022-12-18 DIAGNOSIS — C549 Malignant neoplasm of corpus uteri, unspecified: Secondary | ICD-10-CM

## 2022-12-18 DIAGNOSIS — Z5111 Encounter for antineoplastic chemotherapy: Secondary | ICD-10-CM | POA: Insufficient documentation

## 2022-12-18 DIAGNOSIS — T451X5A Adverse effect of antineoplastic and immunosuppressive drugs, initial encounter: Secondary | ICD-10-CM

## 2022-12-18 DIAGNOSIS — K5909 Other constipation: Secondary | ICD-10-CM | POA: Diagnosis not present

## 2022-12-18 DIAGNOSIS — R600 Localized edema: Secondary | ICD-10-CM

## 2022-12-18 DIAGNOSIS — G620 Drug-induced polyneuropathy: Secondary | ICD-10-CM | POA: Insufficient documentation

## 2022-12-18 DIAGNOSIS — Z5112 Encounter for antineoplastic immunotherapy: Secondary | ICD-10-CM | POA: Insufficient documentation

## 2022-12-18 MED FILL — Fosaprepitant Dimeglumine For IV Infusion 150 MG (Base Eq): INTRAVENOUS | Qty: 5 | Status: AC

## 2022-12-18 NOTE — Assessment & Plan Note (Signed)
She has regular bowel movement with aggressive laxatives She will continue the same

## 2022-12-18 NOTE — Progress Notes (Signed)
HEMATOLOGY-ONCOLOGY ELECTRONIC VISIT PROGRESS NOTE  Patient Care Team: [name], Algis Greenhouse, DO as PCP - General (Family Medicine)  I connected with the patient via telephone conference and verified that I am speaking with the correct person using two identifiers. The patient's location is at home and I am providing care from the [facility] I discussed the limitations, risks, security and privacy concerns of performing an evaluation and management service by e-visits and the availability of in person appointments.  I also discussed with the patient that there may be a patient responsible charge related to this service. The patient expressed understanding and agreed to proceed.   ASSESSMENT & PLAN:  Uterine cancer (HCC) Overall, she tolerated last cycle therapy well except for new onset of neuropathy With furosemide, her leg swelling has improved and she have lost almost 15 pounds of fluid weight I will readjust the dose of her treatment and reduce the dose of paclitaxel She is scheduled for CT imaging end of the month for objective assessment of response to therapy  Peripheral neuropathy due to chemotherapy (HCC) Neuropathy is slightly worse and she was recently prescribed gabapentin I will reduce the dose of paclitaxel further  Dysuria This has improved with topical estrogen cream She will continue the same  Other constipation She has regular bowel movement with aggressive laxatives She will continue the same  Bilateral edema of lower extremity This has improved with furosemide Her daughter will continue to give her furosemide as needed to keep her weight around 170 pounds I will readjust the dose of her chemotherapy based on her most current weight  No orders of the defined types were placed in this encounter.   INTERVAL HISTORY: Please see below for problem oriented charting. 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  With intermittent dosage of furosemide, her weight is now down to about 170 pounds She denies recent bleeding Overall, she tolerated treatment fairly well  SUMMARY OF ONCOLOGIC HISTORY: Oncology History Overview Note  Outside path showed carcinosarcoma, PD-L1 positive, MSI stable   Uterine cancer (HCC)  07/30/2021 Initial Diagnosis   She was evaluated for post-menopausal bleeding. She reported having been seen a year previously and having a biopsy that was negative.  On exam in clinic, she was noted to have a mass present within the vagina.  This was thought to be a prolapsing fibroid and plan was made for exam under anesthesia, removal prolapsing fibroid with hysteroscopy and D&C   08/11/2021 Pathology Results   PD-L1 [phone]):                                             Does meet FDA-approval, Combined Positive Score (CPS): >=1   Pan-TRK IHC:  Tumor cells are Focally Positive for TRK protein expression.   MICROSATELLITE INSTABILITY:             MS-Stable   TUMOR MUTATION BURDEN:                10.8 Muts/Mb TMB-Low   OTHER BIOMARKERS:                              MET amplification PIK3CA amplification MDM2 amplification CCNE1 amplification   Pertinent Negative Biomarkers Evaluated by NGS: ALK ATM BARD1 BRAF BRCA1 BRCA2 CDK12 CHECK1 CHEK2 EGFR  ERBB2 ESR1 FANCL FGFR2 FGFR3 IDH1 IDH2 KIT KRAS NRAS NTRK1 NTRK2 NTRK3 PALB2 PDGFRA RAD51B RAD51C  RAD51D RAD54L RET ROS1   Comment:  The above studies are performed and reported by [facility], [place]) as part of the  ENDEAV[employer] (NGS) panel.  Pertinent results are summarized above.  Please see their separate reports for more detailed information  Addendum electronically signed by Dalbert Mayotte, MD on 09/03/2021 at  7:37 AM  Final Diagnosis    A.  CERVICAL MASS, EXCISION:       - CONSISTENT WITH CARCINOSARCOMA (HIGH GRADE MALIGNANT BIPHASIC TUMOR)  Electronically signed by Dalbert Mayotte, MD on 08/15/2021 at  9:53 AM  Comment    The malignant epithelial component exhibits high-grade cytomorphology and immunostaining pattern consistent with serous carcinoma to include strong positive diffuse reactivity for p53, p16, and vimentin, focal subset CK7 and CK AE1/AE3 expression, with high Ki-67 staining (approximately 60%).  Neuroendocrine differentiation is present (CD56 negative but synaptophysin positive).  The stromal component is composed of malignant mitotically active cells and heterologous elements with focal chondroid differentiation (S100 positive).  SMA appears to decorate rare tumor cells and highlights vascular structures.  The tumor cells are non-reactive for CK20, WT1, GATA3, p40 and desmin.  Block A5 is referred to [facility], [place]) to perform molecular characterization.  These findings are shared with Dr. Jamie Kato on [id number] at [phone] hours by telephone conversation. This case received prospective intradepartmental quality assurance review.   Clinical Information    Cervical mass  Gross Description    A. Cervix Received fresh labeled with the patient's name and date of birth and "cervical mass frozen section" per container is an aggregate of red-tan friable soft tissue measuring approximately 5 x 5 x 3 cm in toto.  Representative sections are frozen for an intraoperative consultation.  Representative sections are submitted as follows:  A1-A2: Frozen section remnants A3-A5: Additional sections  Time of formalin addition: 08/11/2021 8:12 AM    Intraoperative Consultation    A.  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   Results rendered by Dr. Gasper Lloyd and reported to Dr. Paralee Cancel at [facility] on 08/11/2021 at 8:06 AM.  Microscopic Description    Microscopic examination supports the above diagnosis.  Single antibody immunostains are performed to include: S100, p40, p16, p53, PAX8, CK7, CK20, vimentin, CK AE1/3, GATA3, WT1, desmin, SMA, synaptophysin, CD56, and Ki-67.  All controls are appropriately reactive. [id number], [id number], [id number], [id number], [id number], [id number] (15)          08/22/2021 Initial Diagnosis   Carcinosarcoma (HCC)   09/01/2021 Imaging   CT abdomen and pelvis There is marked enlargement of the uterus measuring 13.7 x 10 x 8 x 10.1 cm. There is inhomogeneous enhancement in the uterus consistent with malignant neoplasm.   There are enlarged lymph nodes in the  retroperitoneum in the para-and paracaval regions measuring up to 12 mm in short axis. There are a few enlarged lymph nodes adjacent to the iliac vessels largest measuring 2.6 x 1.4 cm adjacent to the right external iliac vessels. Findings suggest possible metastatic lymphadenopathy. There is mild prominence of the pelvocaliceal system in the left kidney and proximal left ureter. Possibility of extrinsic compression or infiltrative process related to the uterine neoplasm in the distal course of left ureter causing mild obstruction is not excluded.   Gallbladder stone.  Diverticulosis of colon.   Other findings as described in the body of the report.   09/01/2021 Imaging   US pelvis Large central uterine ill-defined mass approximately 7.7 cm diameter enlarging uterus likely representing the biopsy-proven neoplasm, likely of endometrial origin.   Questionable intramural leiomyoma posterior mid uterus 3.1 cm diameter.   The mass extend and the adnexal regions are poorly characterized but may potentially be better evaluated by MR imaging with and without contrast.   09/10/2021  Surgery   Pre-operative Diagnosis: carcinosarcoma of the uterus, adenopathy on imaging   Post-operative Diagnosis: same, stage IVB carcinosarcoma of the uterus   Operation: Diagnostic laparoscopy    Surgeon: Eugene Garnet MD Operative Findings: On EUA, cervix 2 cm dilated with tumor noted within the os. Cervix otherwise normal appearing. Uterus enlarged, 12 cm, with limited mobility secondary to weight. Compression of the rectum from uterus although no direct invasion appreciated. On intra-abdominal inspection, adhesions noted between the liver and the anterior abdominal wall. Evidence of carcinomatosis involving liver surface, diaphragm, omentum, anterior abdominal wall. Surface of the uterus with obvious tumor infiltration. Small bowel free from uterus. Posteriorly, sigmoid densely adherent to the posterior uterus. Even with manipulator, unable to move the uterus anteriorly. Minimal ascites.    Given findings of stage IV disease and what would require large bowel resection and end ostomy, decision made to abort surgery in favor of neoadjuvant chemotherapy. I had discussed this as a distinct possibility when we had gotten her CT results as well as this morning prior to the surgery.    09/15/2021 Cancer Staging   Staging form: Corpus Uteri - Carcinoma and Carcinosarcoma, AJCC 8th Edition - Clinical stage from 09/15/2021: FIGO Stage IVB (cT3, cN2a, pM1) - Signed by Artis Delay, MD on 09/15/2021 Stage prefix: Initial diagnosis   09/24/2021 - 03/12/2022 Chemotherapy   Patient is on Treatment Plan : UTERINE ENDOMETRIAL Dostarlimab-gxly (500 mg) + Carboplatin (AUC 5) + Paclitaxel (175 mg/m2) q21d x 6 cycles / Dostarlimab-gxly (1000 mg) q42d x 6 cycles      10/17/2021 Procedure   Status post revision of nonfunctional right IJ port catheter, with new right IJ port catheter placed. Port is ready for use.     11/24/2021 Imaging   1. Today's study demonstrates a positive response to therapy with partial  involution of large malignant appearing uterine mass. 2. Multiple prominent borderline enlarged and mildly enlarged retroperitoneal and pelvic lymph nodes are stable to minimally decreased in size compared to the prior study, potentially metastatic. No new lymphadenopathy is noted elsewhere in the chest, abdomen or pelvis. 3. Multiple small pulmonary nodules measuring 5 mm or less in the lungs. These were incidentally imaged on the prior CT of the abdomen and pelvis 09/01/2021, and at this time are stable. The possibility of metastatic disease is not excluded, and close attention on follow-up studies is recommended. 4. Colonic diverticulosis without evidence of acute diverticulitis at this time. 5. Additional incidental findings, as above.  12/31/2021 Surgery   Robotic-assisted laparoscopic total hysterectomy with bilateral salpingo-oophorectomy, lysis of adhesions for approximately 40 minutes, cystoscopy   Findings: On EUA, moderately mobile enlarged uterus.  On intra-abdominal entry, filmy adhesions between the anterior liver and the diaphragm bilaterally.  No carcinomatosis noted on the diaphragm or liver.  Normal-appearing stomach, small bowel and omentum.  No peritoneal lesions in the lower abdominal cavity or pelvis.  No ascites.  Uterus enlarged measuring approximately 12-14 cm, bulbous at the top.  Posterior 2 cm fibroid near the fundus.  Posterior aspect of the uterus densely adherent to the colon mesentery.  Somewhat dilated fluid-filled left fallopian tube.  Normal-appearing right adnexa.  Left ovary adherent to the broad ligament and sigmoid mesentery but otherwise normal-appearing.  Significant fibrosis bilaterally of the retroperitoneum.  No obvious adenopathy appreciated. On cystoscopy, bladder dome intact, good efflux noted from bilateral ureteral orifices.   12/31/2021 Pathology Results   A. CULDESAC, POSTERIOR ADHESIONS, EXCISION: -  Serosa and subserosal fibrous soft tissue with  focal acute inflammation, reactive mesothelial hyperplasia and adhesions. -  Negative for malignancy.  B. UTERUS, CERVIX, BILATERAL TUBE AND OVAARIES, HYSTERECTOMY: - -  Carcinosarcoma (malignant mixed Mullerian tumor) with full-thickness posterior myometrial involvement with focal serosal present and extension into lower uterine segment and extensively in the cervical stroma. 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 Not currently on therapy. * Tracking Code: BO * EXAM: CT CHEST, ABDOMEN, AND PELVIS WITH CONTRAST TECHNIQUE: Multidetector CT imaging of the chest, abdomen and pelvis was performed following the standard protocol during bolus administration of intravenous contrast. RADIATION DOSE REDUCTION: This exam was performed according to the departmental  dose-optimization program which includes automated exposure control, adjustment of the mA and/or kV according to patient size and/or use of iterative reconstruction technique. CONTRAST:  OMNIPAQUE IOHEXOL 300 MG/ML  SOLN COMPARISON:  Multiple priors including CT April 03, 2022 and November 21, 2021 FINDINGS: CT CHEST FINDINGS Cardiovascular: Accessed right chest Port-A-Cath with tip at the superior cavoatrial junction. Normal caliber thoracic aorta. No central pulmonary embolus on this nondedicated study. Normal size heart. No significant pericardial effusion/thickening. Mediastinum/Nodes: No suspicious thyroid nodule. No pathologically enlarged mediastinal, hilar or axillary lymph nodes. Patulous esophagus. Lungs/Pleura: Scattered bilateral pulmonary nodules are stable from prior examination. No new suspicious pulmonary nodules or masses. For reference: -pulmonary nodule in the medial right lower lobe measuring 5 mm on image 74/4 is unchanged. -3 mm right middle lobe pulmonary nodule on image 89/4 is unchanged. Musculoskeletal: No aggressive lytic or  blastic lesion of bone. Multilevel degenerative change of the spine. CT ABDOMEN PELVIS FINDINGS Hepatobiliary: No suspicious hepatic lesion. Gallbladder is unremarkable. No biliary ductal dilation. Pancreas: No pancreatic ductal dilation or evidence of acute inflammation. Spleen: No splenomegaly. Adrenals/Urinary Tract: Bilateral adrenal glands appear normal. No hydronephrosis. Kidneys demonstrate symmetric enhancement. Urinary bladder is unremarkable for degree of distension. Stomach/Bowel: Radiopaque enteric contrast material traverses the splenic flexure. Stomach is unremarkable for degree of distension. No pathologic dilation of small or large bowel. No evidence of acute bowel inflammation. Vascular/Lymphatic: Normal caliber abdominal aorta. The portal, splenic and superior mesenteric veins are patent. New retroperitoneal, iliac side chain and pelvic  sidewall adenopathy. For reference: -retrocaval right upper quadrant lymph node measures 10 mm in short axis on image 65/2 -aortocaval lymph node measures 18 mm in short axis on image 77/2 -left common iliac lymph node measures 2.3 cm in short axis on image 83/2 -left obturator lymph node measures 2.1 cm in short axis on image 94/2. Reproductive: Uterus is surgically absent with a new 2.6 x 2.2 cm nodule near the left vaginal cuff on image 98/2. Other: Mild mesenteric stranding similar prior. No discrete peritoneal or omental nodularity. Nonspecific subcutaneous edema. Musculoskeletal: Round sclerotic osseous lesion in the L1 vertebral body is unchanged. No new aggressive lytic or blastic lesion of bone. Multilevel degenerative changes spine. IMPRESSION: 1. New abdominopelvic adenopathy, compatible with recurrent nodal metastatic disease. 2. New 2.6 cm nodule near the left vaginal cuff, concerning for recurrent local disease versus and adjacent nodal metastasis. 3. Stable round sclerotic osseous lesion in the L1 vertebral body. 4. Scattered bilateral pulmonary nodules are stable from prior examination. No new suspicious pulmonary nodules or masses. These results will be called to the ordering clinician or representative by the Radiologist Assistant, and communication documented in the PACS or [employer]. Electronically Signed   By: Maudry Mayhew M.D.   On: 10/22/2022 11:37      11/09/2022 -  Chemotherapy   Patient is on Treatment Plan : UTERINE ENDOMETRIAL Dostarlimab-gxly (500 mg) + Carboplatin (AUC 5) + Paclitaxel (175 mg/m2) q21d x 6 cycles / Dostarlimab-gxly (1000 mg) q42d x 6 cycles        REVIEW OF SYSTEMS:   Constitutional: Denies fevers, chills or abnormal weight loss Eyes: Denies blurriness of vision Ears, nose, mouth, throat, and face: Denies mucositis or sore throat Respiratory: Denies cough, dyspnea or wheezes Cardiovascular: Denies palpitation, chest discomfort Skin: Denies abnormal  skin rashes Lymphatics: Denies new lymphadenopathy or easy bruising Behavioral/Psych: Mood is stable, no new changes  Extremities: No lower extremity edema All other systems were reviewed with the patient and are negative.  I have reviewed the past medical history, past surgical history, social history and family history with the patient and they are unchanged from previous note.  ALLERGIES:  is allergic to hydrocodone-acetaminophen and oxycodone-acetaminophen.  MEDICATIONS:  Current Outpatient Medications  Medication Sig Dispense Refill   estradiol (ESTRACE VAGINAL) 0.1 MG/GM vaginal cream Place 1 Applicatorful vaginally at bedtime. 42.5 g 12   allopurinol (ZYLOPRIM) 300 MG tablet Take 300 mg by mouth daily.     amiodarone (PACERONE) 200 MG tablet Take 1 tablet (200 mg total) by mouth daily. 30 tablet 3   apixaban (ELIQUIS) 5 MG TABS tablet Take 1 tablet (5 mg total) by mouth 2 (two) times daily. 60 tablet 3   ascorbic acid (VITAMIN C) 250 MG tablet Take 250 mg by mouth daily.     Biotin (BIOTIN 5000) 5 MG CAPS  Take 5 mg by mouth daily.     dexamethasone (DECADRON) 4 MG tablet Take 2 tabs at the night before and 2 tab the morning of chemotherapy, every 3 weeks, by mouth x 6 cycles 24 tablet 6   furosemide (LASIX) 20 MG tablet Take 1 tablet (20 mg total) by mouth daily. 30 tablet 3   gabapentin (NEURONTIN) 100 MG capsule Take 1 capsule (100 mg total) by mouth 2 (two) times daily. 30 capsule 0   lidocaine-prilocaine (EMLA) cream Apply to affected area once 30 g 3   metoprolol succinate (TOPROL-XL) 25 MG 24 hr tablet Take 25 mg by mouth daily.     Multiple Vitamins-Minerals (MULTIVITAMIN WITH MINERALS) tablet Take 1 tablet by mouth at bedtime. Centrum     ondansetron (ZOFRAN) 8 MG tablet Take 1 tablet (8 mg total) by mouth every 8 (eight) hours as needed for nausea or vomiting. Start on the third day after chemotherapy. 30 tablet 1   polyethylene glycol (MIRALAX / GLYCOLAX) 17 g packet Take  17 g by mouth daily.     potassium chloride (KLOR-CON) 10 MEQ tablet Take 10 mEq by mouth daily.     prochlorperazine (COMPAZINE) 10 MG tablet Take 1 tablet (10 mg total) by mouth every 6 (six) hours as needed for nausea or vomiting. 30 tablet 1   simvastatin (ZOCOR) 10 MG tablet Take 10 mg by mouth daily at 6 PM.     No current facility-administered medications for this visit.    PHYSICAL EXAMINATION: ECOG PERFORMANCE STATUS: 2 - Symptomatic, <50% confined to bed  LABORATORY DATA:  I have reviewed the data as listed    Latest Ref Rng & Units 11/30/2022    9:15 AM 11/09/2022    9:24 AM 10/08/2022    8:43 AM  CMP  Glucose 70 - 99 mg/dL 347  425  90   BUN 8 - 23 mg/dL 17  18  16    Creatinine 0.44 - 1.00 mg/dL 9.56  3.87  5.64   Sodium 135 - 145 mmol/L 141  137  140   Potassium 3.5 - 5.1 mmol/L 3.5  3.9  4.0   Chloride 98 - 111 mmol/L 103  98  104   CO2 22 - 32 mmol/L 30  29  28    Calcium 8.9 - 10.3 mg/dL 9.4  33.2  9.2   Total Protein 6.5 - 8.1 g/dL 6.8  8.7  7.5   Total Bilirubin 0.3 - 1.2 mg/dL 0.5  0.7  0.7   Alkaline Phos 38 - 126 U/L 46  57  50   AST 15 - 41 U/L 26  19  21    ALT 0 - 44 U/L 14  11  13      Lab Results  Component Value Date   WBC 4.3 11/30/2022   HGB 10.2 (L) 11/30/2022   HCT 31.6 (L) 11/30/2022   MCV 87.5 11/30/2022   PLT 305 11/30/2022   NEUTROABS 3.3 11/30/2022    I discussed the assessment and treatment plan with the patient. The patient was provided an opportunity to ask questions and all were answered. The patient agreed with the plan and demonstrated an understanding of the instructions. The patient was advised to call back or seek an in-person evaluation if the symptoms worsen or if the condition fails to improve as anticipated.    I spent 40 minutes for the appointment reviewing test results, discuss management and coordination of care.  Artis Delay, MD 12/18/2022 8:51 AM

## 2022-12-18 NOTE — Assessment & Plan Note (Signed)
Overall, she tolerated last cycle therapy well except for new onset of neuropathy With furosemide, her leg swelling has improved and she have lost almost 15 pounds of fluid weight I will readjust the dose of her treatment and reduce the dose of paclitaxel She is scheduled for CT imaging end of the month for objective assessment of response to therapy

## 2022-12-18 NOTE — Assessment & Plan Note (Signed)
This has improved with topical estrogen cream She will continue the same

## 2022-12-18 NOTE — Assessment & Plan Note (Signed)
This has improved with furosemide Her daughter will continue to give her furosemide as needed to keep her weight around 170 pounds I will readjust the dose of her chemotherapy based on her most current weight

## 2022-12-18 NOTE — Assessment & Plan Note (Signed)
Neuropathy is slightly worse and she was recently prescribed gabapentin I will reduce the dose of paclitaxel further

## 2022-12-21 ENCOUNTER — Inpatient Hospital Stay: Payer: Medicare HMO

## 2022-12-21 VITALS — BP 125/69 | HR 61 | Temp 97.8°F | Resp 16 | Ht 66.0 in | Wt 180.5 lb

## 2022-12-21 DIAGNOSIS — C549 Malignant neoplasm of corpus uteri, unspecified: Secondary | ICD-10-CM

## 2022-12-21 DIAGNOSIS — Z5111 Encounter for antineoplastic chemotherapy: Secondary | ICD-10-CM | POA: Diagnosis not present

## 2022-12-21 DIAGNOSIS — R600 Localized edema: Secondary | ICD-10-CM | POA: Diagnosis not present

## 2022-12-21 DIAGNOSIS — K5909 Other constipation: Secondary | ICD-10-CM | POA: Diagnosis not present

## 2022-12-21 DIAGNOSIS — C55 Malignant neoplasm of uterus, part unspecified: Secondary | ICD-10-CM | POA: Diagnosis not present

## 2022-12-21 DIAGNOSIS — G620 Drug-induced polyneuropathy: Secondary | ICD-10-CM | POA: Diagnosis not present

## 2022-12-21 DIAGNOSIS — Z79899 Other long term (current) drug therapy: Secondary | ICD-10-CM | POA: Diagnosis not present

## 2022-12-21 DIAGNOSIS — Z5112 Encounter for antineoplastic immunotherapy: Secondary | ICD-10-CM | POA: Diagnosis not present

## 2022-12-21 LAB — CBC WITH DIFFERENTIAL (CANCER CENTER ONLY)
Abs Immature Granulocytes: 0.04 10*3/uL (ref 0.00–0.07)
Basophils Absolute: 0 10*3/uL (ref 0.0–0.1)
Basophils Relative: 0 %
Eosinophils Absolute: 0 10*3/uL (ref 0.0–0.5)
Eosinophils Relative: 0 %
HCT: 30.4 % — ABNORMAL LOW (ref 36.0–46.0)
Hemoglobin: 9.7 g/dL — ABNORMAL LOW (ref 12.0–15.0)
Immature Granulocytes: 1 %
Lymphocytes Relative: 15 %
Lymphs Abs: 1 10*3/uL (ref 0.7–4.0)
MCH: 27.9 pg (ref 26.0–34.0)
MCHC: 31.9 g/dL (ref 30.0–36.0)
MCV: 87.4 fL (ref 80.0–100.0)
Monocytes Absolute: 0.1 10*3/uL (ref 0.1–1.0)
Monocytes Relative: 1 %
Neutro Abs: 5.3 10*3/uL (ref 1.7–7.7)
Neutrophils Relative %: 83 %
Platelet Count: 292 10*3/uL (ref 150–400)
RBC: 3.48 MIL/uL — ABNORMAL LOW (ref 3.87–5.11)
RDW: 17.1 % — ABNORMAL HIGH (ref 11.5–15.5)
Smear Review: NORMAL
WBC Count: 6.3 10*3/uL (ref 4.0–10.5)
nRBC: 0 % (ref 0.0–0.2)

## 2022-12-21 LAB — CMP (CANCER CENTER ONLY)
ALT: 10 U/L (ref 0–44)
AST: 20 U/L (ref 15–41)
Albumin: 3.6 g/dL (ref 3.5–5.0)
Alkaline Phosphatase: 54 U/L (ref 38–126)
Anion gap: 10 (ref 5–15)
BUN: 16 mg/dL (ref 8–23)
CO2: 29 mmol/L (ref 22–32)
Calcium: 8.9 mg/dL (ref 8.9–10.3)
Chloride: 101 mmol/L (ref 98–111)
Creatinine: 0.71 mg/dL (ref 0.44–1.00)
GFR, Estimated: >60 mL/min (ref >60)
Glucose, Bld: 143 mg/dL — ABNORMAL HIGH (ref 70–99)
Potassium: 3.4 mmol/L — ABNORMAL LOW (ref 3.5–5.1)
Sodium: 140 mmol/L (ref 135–145)
Total Bilirubin: 0.5 mg/dL (ref <1.2)
Total Protein: 7.5 g/dL (ref 6.5–8.1)

## 2022-12-21 LAB — TSH: TSH: 0.272 u[IU]/mL — ABNORMAL LOW (ref 0.350–4.500)

## 2022-12-21 MED ORDER — SODIUM CHLORIDE 0.9 % IV SOLN: Freq: Once | INTRAVENOUS | Status: AC

## 2022-12-21 MED ORDER — SODIUM CHLORIDE 0.9 % IV SOLN
150.0000 mg | Freq: Once | INTRAVENOUS | Status: AC
  Administered 2022-12-21: 150 mg via INTRAVENOUS
  Filled 2022-12-21: qty 150

## 2022-12-21 MED ORDER — SODIUM CHLORIDE 0.9% FLUSH
10.0000 mL | INTRAVENOUS | Status: DC | PRN
Start: 2022-12-21 — End: 2022-12-21
  Administered 2022-12-21: 10 mL

## 2022-12-21 MED ORDER — SODIUM CHLORIDE 0.9% FLUSH
10.0000 mL | Freq: Once | INTRAVENOUS | Status: AC
  Administered 2022-12-21: 10 mL

## 2022-12-21 MED ORDER — CETIRIZINE HCL 10 MG/ML IV SOLN
10.0000 mg | Freq: Once | INTRAVENOUS | Status: AC
  Administered 2022-12-21: 10 mg via INTRAVENOUS
  Filled 2022-12-21: qty 1

## 2022-12-21 MED ORDER — PALONOSETRON HCL INJECTION 0.25 MG/5ML
0.2500 mg | Freq: Once | INTRAVENOUS | Status: AC
  Administered 2022-12-21: 0.25 mg via INTRAVENOUS
  Filled 2022-12-21: qty 5

## 2022-12-21 MED ORDER — DEXAMETHASONE SODIUM PHOSPHATE 10 MG/ML IJ SOLN
10.0000 mg | Freq: Once | INTRAMUSCULAR | Status: AC
  Administered 2022-12-21: 10 mg via INTRAVENOUS
  Filled 2022-12-21: qty 1

## 2022-12-21 MED ORDER — SODIUM CHLORIDE 0.9 % IV SOLN
105.0000 mg/m2 | Freq: Once | INTRAVENOUS | Status: AC
  Administered 2022-12-21: 198 mg via INTRAVENOUS
  Filled 2022-12-21: qty 33

## 2022-12-21 MED ORDER — SODIUM CHLORIDE 0.9 % IV SOLN
500.0000 mg | Freq: Once | INTRAVENOUS | Status: AC
  Administered 2022-12-21: 500 mg via INTRAVENOUS
  Filled 2022-12-21: qty 10

## 2022-12-21 MED ORDER — FAMOTIDINE IN NACL 20-0.9 MG/50ML-% IV SOLN
20.0000 mg | Freq: Once | INTRAVENOUS | Status: AC
  Administered 2022-12-21: 20 mg via INTRAVENOUS
  Filled 2022-12-21: qty 50

## 2022-12-21 MED ORDER — CARBOPLATIN CHEMO INJECTION 600 MG/60ML
428.5000 mg | Freq: Once | INTRAVENOUS | Status: AC
  Administered 2022-12-21: 430 mg via INTRAVENOUS
  Filled 2022-12-21: qty 43

## 2022-12-21 MED ORDER — HEPARIN SOD (PORK) LOCK FLUSH 100 UNIT/ML IV SOLN
500.0000 [IU] | Freq: Once | INTRAVENOUS | Status: AC | PRN
  Administered 2022-12-21: 500 [IU]

## 2022-12-21 NOTE — Patient Instructions (Signed)
[facility] - A DEPT OF [facility]  Discharge Instructions: Thank you for choosing [facility] to provide your oncology and hematology care.   If you have a lab appointment with the Cancer Center, please go directly to the Cancer Center and check in at the registration area.   Wear comfortable clothing and clothing appropriate for easy access to any Portacath or PICC line.   We strive to give you quality time with your provider. You may need to reschedule your appointment if you arrive late (15 or more minutes).  Arriving late affects you and other patients whose appointments are after yours.  Also, if you miss three or more appointments without notifying the office, you may be dismissed from the clinic at the provider's discretion.      For prescription refill requests, have your pharmacy contact our office and allow 72 hours for refills to be completed.    Today you received the following chemotherapy and/or immunotherapy agents: Taxol/Carboplatin      To help prevent nausea and vomiting after your treatment, we encourage you to take your nausea medication as directed.  BELOW ARE SYMPTOMS THAT SHOULD BE REPORTED IMMEDIATELY: *FEVER GREATER THAN 100.4 F (38 C) OR HIGHER *CHILLS OR SWEATING *NAUSEA AND VOMITING THAT IS NOT CONTROLLED WITH YOUR NAUSEA MEDICATION *UNUSUAL SHORTNESS OF BREATH *UNUSUAL BRUISING OR BLEEDING *URINARY PROBLEMS (pain or burning when urinating, or frequent urination) *BOWEL PROBLEMS (unusual diarrhea, constipation, pain near the anus) TENDERNESS IN MOUTH AND THROAT WITH OR WITHOUT PRESENCE OF ULCERS (sore throat, sores in mouth, or a toothache) UNUSUAL RASH, SWELLING OR PAIN  UNUSUAL VAGINAL DISCHARGE OR ITCHING   Items with * indicate a potential emergency and should be followed up as soon as possible or go to the Emergency Department if any problems should occur.  Please show the CHEMOTHERAPY ALERT CARD or  IMMUNOTHERAPY ALERT CARD at check-in to the Emergency Department and triage nurse.  Should you have questions after your visit or need to cancel or reschedule your appointment, please contact [facility] - A DEPT OF [facility]  Dept: [phone]  and follow the prompts.  Office hours are 8:00 a.m. to 4:30 p.m. Monday - Friday. Please note that voicemails left after 4:00 p.m. may not be returned until the following business day.  We are closed weekends and major holidays. You have access to a nurse at all times for urgent questions. Please call the main number to the clinic Dept: [phone] and follow the prompts.   For any non-urgent questions, you may also contact your provider using MyChart. We now offer e-Visits for anyone 75 and older to request care online for non-urgent symptoms. For details visit [email or web address].   Also download the MyChart app! Go to the app store, search "MyChart", open the app, select [facility], and log in with your MyChart username and password.

## 2022-12-22 LAB — T4: T4, Total: 13.4 ug/dL — ABNORMAL HIGH (ref 4.5–12.0)

## 2022-12-23 ENCOUNTER — Other Ambulatory Visit: Payer: Self-pay | Admitting: Internal Medicine

## 2022-12-23 DIAGNOSIS — I4891 Unspecified atrial fibrillation: Secondary | ICD-10-CM

## 2022-12-23 MED ORDER — APIXABAN 5 MG PO TABS: 5.0000 mg | ORAL_TABLET | Freq: Two times a day (BID) | ORAL | 1 refills | Status: DC

## 2022-12-23 MED ORDER — APIXABAN 5 MG PO TABS: 5.0000 mg | ORAL_TABLET | Freq: Two times a day (BID) | ORAL | 0 refills | Status: DC

## 2022-12-23 NOTE — Telephone Encounter (Signed)
*  STAT* If patient is at the pharmacy, call can be transferred to refill team.  This provider is no longer with Dr Verl Dicker group   1. Which medications need to be refilled? (please list name of each medication and dose if known) Eliquis   2. Would you like to learn more about the convenience, safety, & potential cost savings by using the [facility]?     3. Are you open to using the [facility] (Type [facility].    4. Which pharmacy/location (including street and city if local pharmacy) is medication to be sent to?[facility]   5. Do they need a 30 day or 90 day supply? 90 days and refills need a a 7 days supply# 14 to her local pharmacy- [facility] [place], [place]   [phone]-  [address] [place], [place]

## 2022-12-23 NOTE — Telephone Encounter (Signed)
Eliquis 5mg  refill request received. Patient is 72 years old, weight-81.9kg, Crea-0.71 on 12/21/22, Diagnosis-Afib, and last seen by Rozell Searing Custovic (PCV) on 05/15/22. Dose is appropriate based on dosing criteria. Will send in refill to requested pharmacy.    90 day supply sent to [facility] 7 day supply sent to local [facility]

## 2022-12-23 NOTE — Telephone Encounter (Signed)
Sent RX to Coumadin

## 2022-12-28 ENCOUNTER — Telehealth: Payer: Self-pay

## 2022-12-28 NOTE — Telephone Encounter (Signed)
Returned call to daughter. Mercedez is using Estrace daily and started on 10/23. She is still complaining with burning with urination.  Orlie Pollen is asking if Dr. Bertis Ruddy recommends anything else?

## 2022-12-29 ENCOUNTER — Encounter: Payer: Self-pay | Admitting: Hematology and Oncology

## 2022-12-29 DIAGNOSIS — R32 Unspecified urinary incontinence: Secondary | ICD-10-CM | POA: Diagnosis not present

## 2022-12-29 DIAGNOSIS — I480 Paroxysmal atrial fibrillation: Secondary | ICD-10-CM | POA: Diagnosis not present

## 2022-12-29 DIAGNOSIS — N3000 Acute cystitis without hematuria: Secondary | ICD-10-CM | POA: Diagnosis not present

## 2022-12-29 NOTE — Telephone Encounter (Signed)
Called and given below message. Sarah Wu verbalized understanding and will take her Mom to local PCP to get evaluated for UTI.

## 2022-12-29 NOTE — Telephone Encounter (Signed)
Continue estrace but get local PCP to check for UTI

## 2023-01-04 ENCOUNTER — Encounter: Payer: Self-pay | Admitting: Hematology and Oncology

## 2023-01-04 ENCOUNTER — Inpatient Hospital Stay (HOSPITAL_BASED_OUTPATIENT_CLINIC_OR_DEPARTMENT_OTHER): Payer: Medicare HMO | Admitting: Hematology and Oncology

## 2023-01-04 ENCOUNTER — Encounter (HOSPITAL_COMMUNITY): Payer: Self-pay

## 2023-01-04 ENCOUNTER — Ambulatory Visit (HOSPITAL_COMMUNITY)
Admission: RE | Admit: 2023-01-04 | Discharge: 2023-01-04 | Disposition: A | Discharge status: Home / Self Care | Payer: Medicare HMO | Source: Ambulatory Visit | Attending: Hematology and Oncology | Admitting: Hematology and Oncology

## 2023-01-04 VITALS — BP 108/64 | HR 61 | Temp 98.1°F | Resp 18 | Ht 66.0 in | Wt 179.4 lb

## 2023-01-04 DIAGNOSIS — K5909 Other constipation: Secondary | ICD-10-CM | POA: Diagnosis not present

## 2023-01-04 DIAGNOSIS — K8020 Calculus of gallbladder without cholecystitis without obstruction: Secondary | ICD-10-CM | POA: Diagnosis not present

## 2023-01-04 DIAGNOSIS — C549 Malignant neoplasm of corpus uteri, unspecified: Secondary | ICD-10-CM | POA: Diagnosis not present

## 2023-01-04 DIAGNOSIS — Z79899 Other long term (current) drug therapy: Secondary | ICD-10-CM | POA: Diagnosis not present

## 2023-01-04 DIAGNOSIS — K429 Umbilical hernia without obstruction or gangrene: Secondary | ICD-10-CM | POA: Diagnosis not present

## 2023-01-04 DIAGNOSIS — R600 Localized edema: Secondary | ICD-10-CM

## 2023-01-04 DIAGNOSIS — T451X5A Adverse effect of antineoplastic and immunosuppressive drugs, initial encounter: Secondary | ICD-10-CM

## 2023-01-04 DIAGNOSIS — R300 Dysuria: Secondary | ICD-10-CM

## 2023-01-04 DIAGNOSIS — Z5111 Encounter for antineoplastic chemotherapy: Secondary | ICD-10-CM | POA: Diagnosis not present

## 2023-01-04 DIAGNOSIS — R599 Enlarged lymph nodes, unspecified: Secondary | ICD-10-CM | POA: Diagnosis not present

## 2023-01-04 DIAGNOSIS — G620 Drug-induced polyneuropathy: Secondary | ICD-10-CM | POA: Diagnosis not present

## 2023-01-04 DIAGNOSIS — R918 Other nonspecific abnormal finding of lung field: Secondary | ICD-10-CM | POA: Diagnosis not present

## 2023-01-04 DIAGNOSIS — Z5112 Encounter for antineoplastic immunotherapy: Secondary | ICD-10-CM | POA: Diagnosis not present

## 2023-01-04 DIAGNOSIS — C55 Malignant neoplasm of uterus, part unspecified: Secondary | ICD-10-CM | POA: Diagnosis not present

## 2023-01-04 MED ORDER — HEPARIN SOD (PORK) LOCK FLUSH 100 UNIT/ML IV SOLN
INTRAVENOUS | Status: AC
  Filled 2023-01-04: qty 5

## 2023-01-04 MED ORDER — HEPARIN SOD (PORK) LOCK FLUSH 100 UNIT/ML IV SOLN
500.0000 [IU] | Freq: Once | INTRAVENOUS | Status: AC
  Administered 2023-01-04: 500 [IU] via INTRAVENOUS

## 2023-01-04 MED ORDER — IOHEXOL 300 MG/ML  SOLN
100.0000 mL | Freq: Once | INTRAMUSCULAR | Status: AC | PRN
  Administered 2023-01-04: 100 mL via INTRAVENOUS

## 2023-01-04 MED ORDER — IOHEXOL 300 MG/ML  SOLN
30.0000 mL | Freq: Once | INTRAMUSCULAR | Status: AC | PRN
  Administered 2023-01-04: 30 mL via ORAL

## 2023-01-04 NOTE — Assessment & Plan Note (Signed)
This has improved with furosemide Her daughter will continue to give her furosemide as needed to keep her weight around 170 pounds

## 2023-01-04 NOTE — Assessment & Plan Note (Signed)
Neuropathy is stable She will continue gabapentin.  We will continue similar dose reduction as before

## 2023-01-04 NOTE — Progress Notes (Signed)
[facility] OFFICE PROGRESS NOTE  Patient Care Team: Sable Feil, DO as PCP - General (Family Medicine)  ASSESSMENT & PLAN:  Uterine cancer [facility]) I have reviewed his CT imaging with the patient and her daughter Overall, she has positive response to therapy She appears to tolerate treatment better at this time around I plan to continue a few more cycles of treatment with repeat imaging study again after cycle 6 of therapy, due around end of January  Peripheral neuropathy due to chemotherapy [facility]) Neuropathy is stable She will continue gabapentin.  We will continue similar dose reduction as before  Other constipation She has regular bowel movement with aggressive laxatives She will continue the same  Bilateral edema of lower extremity This has improved with furosemide Her daughter will continue to give her furosemide as needed to keep her weight around 170 pounds   Dysuria She had multiple investigation and antibiotics for symptom of dysuria but no infection is found This is due to atrophic vaginitis I encouraged the patient to continue to use topical estrogen cream  No orders of the defined types were placed in this encounter.   All questions were answered. The patient knows to call the clinic with any problems, questions or concerns. The total time spent in the appointment was 40 minutes encounter with patients including review of chart and various tests results, discussions about plan of care and coordination of care plan   Artis Delay, MD 01/04/2023 1:37 PM  INTERVAL HISTORY: Please see below for problem oriented charting. she returns for surveillance follow-up and review of test results I reviewed CT imaging with the patient Overall, she tolerated last cycle therapy well except for persistent discomfort when she urinate She has intermittent constipation Her leg swelling has improved Denies worsening peripheral neuropathy  REVIEW OF SYSTEMS:    Constitutional: Denies fevers, chills or abnormal weight loss Eyes: Denies blurriness of vision Ears, nose, mouth, throat, and face: Denies mucositis or sore throat Respiratory: Denies cough, dyspnea or wheezes Cardiovascular: Denies palpitation, chest discomfort  Skin: Denies abnormal skin rashes Lymphatics: Denies new lymphadenopathy or easy bruising Behavioral/Psych: Mood is stable, no new changes  All other systems were reviewed with the patient and are negative.  I have reviewed the past medical history, past surgical history, social history and family history with the patient and they are unchanged from previous note.  ALLERGIES:  is allergic to hydrocodone-acetaminophen and oxycodone-acetaminophen.  MEDICATIONS:  Current Outpatient Medications  Medication Sig Dispense Refill   estradiol (ESTRACE VAGINAL) 0.1 MG/GM vaginal cream Place 1 Applicatorful vaginally at bedtime. 42.5 g 12   allopurinol (ZYLOPRIM) 300 MG tablet Take 300 mg by mouth daily.     amiodarone (PACERONE) 200 MG tablet Take 1 tablet (200 mg total) by mouth daily. 30 tablet 3   apixaban (ELIQUIS) 5 MG TABS tablet Take 1 tablet (5 mg total) by mouth 2 (two) times daily. 14 tablet 0   ascorbic acid (VITAMIN C) 250 MG tablet Take 250 mg by mouth daily.     Biotin (BIOTIN 5000) 5 MG CAPS Take 5 mg by mouth daily.     dexamethasone (DECADRON) 4 MG tablet Take 2 tabs at the night before and 2 tab the morning of chemotherapy, every 3 weeks, by mouth x 6 cycles 24 tablet 6   furosemide (LASIX) 20 MG tablet Take 1 tablet (20 mg total) by mouth daily. 30 tablet 3   gabapentin (NEURONTIN) 100 MG capsule Take 1 capsule (100 mg  total) by mouth 2 (two) times daily. 30 capsule 0   lidocaine-prilocaine (EMLA) cream Apply to affected area once 30 g 3   metoprolol succinate (TOPROL-XL) 25 MG 24 hr tablet Take 25 mg by mouth daily.     Multiple Vitamins-Minerals (MULTIVITAMIN WITH MINERALS) tablet Take 1 tablet by mouth at bedtime.  Centrum     ondansetron (ZOFRAN) 8 MG tablet Take 1 tablet (8 mg total) by mouth every 8 (eight) hours as needed for nausea or vomiting. Start on the third day after chemotherapy. 30 tablet 1   polyethylene glycol (MIRALAX / GLYCOLAX) 17 g packet Take 17 g by mouth daily.     potassium chloride (KLOR-CON) 10 MEQ tablet Take 10 mEq by mouth daily.     prochlorperazine (COMPAZINE) 10 MG tablet Take 1 tablet (10 mg total) by mouth every 6 (six) hours as needed for nausea or vomiting. 30 tablet 1   simvastatin (ZOCOR) 10 MG tablet Take 10 mg by mouth daily at 6 PM.     No current facility-administered medications for this visit.    SUMMARY OF ONCOLOGIC HISTORY: Oncology History Overview Note  Outside path showed carcinosarcoma, PD-L1 positive, MSI stable   Uterine cancer (HCC)  07/30/2021 Initial Diagnosis   She was evaluated for post-menopausal bleeding. She reported having been seen a year previously and having a biopsy that was negative.  On exam in clinic, she was noted to have a mass present within the vagina.  This was thought to be a prolapsing fibroid and plan was made for exam under anesthesia, removal prolapsing fibroid with hysteroscopy and D&C   08/11/2021 Pathology Results   PD-L1 [id number]):                                             Does meet FDA-approval, Combined Positive Score (CPS): >=1   Pan-TRK IHC:                                              Tumor cells are Focally Positive for TRK protein expression.   MICROSATELLITE INSTABILITY:             MS-Stable   TUMOR MUTATION BURDEN:                10.8 Muts/Mb TMB-Low   OTHER BIOMARKERS:                              MET amplification PIK3CA amplification MDM2 amplification CCNE1 amplification   Pertinent Negative Biomarkers Evaluated by NGS: ALK ATM BARD1 BRAF BRCA1 BRCA2 CDK12 CHECK1 CHEK2 EGFR  ERBB2 ESR1 FANCL FGFR2 FGFR3 IDH1 IDH2 KIT KRAS NRAS NTRK1 NTRK2 NTRK3 PALB2 PDGFRA RAD51B RAD51C  RAD51D RAD54L RET ROS1    Comment:  The above studies are performed and reported by [facility], [place]) as part of the [employer] (NGS) panel.  Pertinent results are summarized above.  Please see their separate reports for more detailed information  Addendum electronically signed by Dalbert Mayotte, MD on 09/03/2021 at  7:37 AM  Final Diagnosis    A.  CERVICAL MASS, EXCISION:       - CONSISTENT WITH CARCINOSARCOMA (HIGH GRADE MALIGNANT BIPHASIC TUMOR)  Electronically signed  by Dalbert Mayotte, MD on 08/15/2021 at  9:53 AM  Comment    The malignant epithelial component exhibits high-grade cytomorphology and immunostaining pattern consistent with serous carcinoma to include strong positive diffuse reactivity for p53, p16, and vimentin, focal subset CK7 and CK AE1/AE3 expression, with high Ki-67 staining (approximately 60%).  Neuroendocrine differentiation is present (CD56 negative but synaptophysin positive).  The stromal component is composed of malignant mitotically active cells and heterologous elements with focal chondroid differentiation (S100 positive).  SMA appears to decorate rare tumor cells and highlights vascular structures.  The tumor cells are non-reactive for CK20, WT1, GATA3, p40 and desmin.  Block A5 is referred to [facility], [place]) to perform molecular characterization.  These findings are shared with Dr. Jamie Kato on [id number] at [phone] hours by telephone conversation. This case received prospective intradepartmental quality assurance review.   Clinical Information    Cervical mass  Gross Description    A. Cervix Received fresh labeled with the patient's name and date of birth and "cervical mass frozen section" per container is an aggregate of red-tan friable soft tissue measuring approximately 5 x 5 x 3 cm in toto.  Representative sections are frozen for an intraoperative consultation.  Representative sections are submitted as follows:  A1-A2: Frozen section remnants A3-A5: Additional  sections  Time of formalin addition: 08/11/2021 8:12 AM    Intraoperative Consultation    A. 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   Results rendered by Dr. Gasper Lloyd and reported to Dr. Paralee Cancel at [facility] on 08/11/2021 at 8:06 AM.  Microscopic Description    Microscopic examination supports the above diagnosis.  Single antibody immunostains are performed to include: S100, p40, p16, p53, PAX8, CK7, CK20, vimentin, CK AE1/3, GATA3, WT1, desmin, SMA, synaptophysin, CD56, and Ki-67.  All controls are appropriately reactive. [id number], [id number], [id number], [id number], [id number], [id number] (15)          08/22/2021 Initial Diagnosis   Carcinosarcoma (HCC)   09/01/2021 Imaging   CT abdomen and pelvis There is marked enlargement of the uterus measuring 13.7 x 10 x 8 x 10.1 cm. There is inhomogeneous enhancement in the uterus consistent with malignant neoplasm.   There are enlarged lymph nodes in the retroperitoneum in the para-and paracaval regions measuring up to 12 mm in short axis. There are a few enlarged lymph nodes adjacent to the iliac vessels largest measuring 2.6 x 1.4 cm adjacent to the right external iliac vessels. Findings suggest possible metastatic lymphadenopathy. There is mild prominence of the pelvocaliceal system in the left kidney and proximal left ureter. Possibility of extrinsic compression or infiltrative process related to the uterine neoplasm in the distal course of left ureter causing mild obstruction is not excluded.   Gallbladder stone.  Diverticulosis of colon.   Other findings as described in the body of the report.   09/01/2021 Imaging   US pelvis Large central uterine ill-defined mass approximately 7.7 cm diameter enlarging uterus likely representing the biopsy-proven neoplasm, likely of endometrial origin.   Questionable intramural leiomyoma posterior mid uterus 3.1 cm diameter.   The mass extend and the adnexal regions are poorly characterized but  may potentially be better evaluated by MR imaging with and without contrast.   09/10/2021 Surgery   Pre-operative Diagnosis: carcinosarcoma of the uterus, adenopathy on imaging   Post-operative Diagnosis: same, stage IVB carcinosarcoma of the uterus   Operation: Diagnostic laparoscopy    Surgeon: Eugene Garnet MD Operative Findings: On EUA, cervix 2  cm dilated with tumor noted within the os. Cervix otherwise normal appearing. Uterus enlarged, 12 cm, with limited mobility secondary to weight. Compression of the rectum from uterus although no direct invasion appreciated. On intra-abdominal inspection, adhesions noted between the liver and the anterior abdominal wall. Evidence of carcinomatosis involving liver surface, diaphragm, omentum, anterior abdominal wall. Surface of the uterus with obvious tumor infiltration. Small bowel free from uterus. Posteriorly, sigmoid densely adherent to the posterior uterus. Even with manipulator, unable to move the uterus anteriorly. Minimal ascites.    Given findings of stage IV disease and what would require large bowel resection and end ostomy, decision made to abort surgery in favor of neoadjuvant chemotherapy. I had discussed this as a distinct possibility when we had gotten her CT results as well as this morning prior to the surgery.    09/15/2021 Cancer Staging   Staging form: Corpus Uteri - Carcinoma and Carcinosarcoma, AJCC 8th Edition - Clinical stage from 09/15/2021: FIGO Stage IVB (cT3, cN2a, pM1) - Signed by Artis Delay, MD on 09/15/2021 Stage prefix: Initial diagnosis   09/24/2021 - 03/12/2022 Chemotherapy   Patient is on Treatment Plan : UTERINE ENDOMETRIAL Dostarlimab-gxly (500 mg) + Carboplatin (AUC 5) + Paclitaxel (175 mg/m2) q21d x 6 cycles / Dostarlimab-gxly (1000 mg) q42d x 6 cycles      10/17/2021 Procedure   Status post revision of nonfunctional right IJ port catheter, with new right IJ port catheter placed. Port is ready for use.     11/24/2021  Imaging   1. Today's study demonstrates a positive response to therapy with partial involution of large malignant appearing uterine mass. 2. Multiple prominent borderline enlarged and mildly enlarged retroperitoneal and pelvic lymph nodes are stable to minimally decreased in size compared to the prior study, potentially metastatic. No new lymphadenopathy is noted elsewhere in the chest, abdomen or pelvis. 3. Multiple small pulmonary nodules measuring 5 mm or less in the lungs. These were incidentally imaged on the prior CT of the abdomen and pelvis 09/01/2021, and at this time are stable. The possibility of metastatic disease is not excluded, and close attention on follow-up studies is recommended. 4. Colonic diverticulosis without evidence of acute diverticulitis at this time. 5. Additional incidental findings, as above.     12/31/2021 Surgery   Robotic-assisted laparoscopic total hysterectomy with bilateral salpingo-oophorectomy, lysis of adhesions for approximately 40 minutes, cystoscopy   Findings: On EUA, moderately mobile enlarged uterus.  On intra-abdominal entry, filmy adhesions between the anterior liver and the diaphragm bilaterally.  No carcinomatosis noted on the diaphragm or liver.  Normal-appearing stomach, small bowel and omentum.  No peritoneal lesions in the lower abdominal cavity or pelvis.  No ascites.  Uterus enlarged measuring approximately 12-14 cm, bulbous at the top.  Posterior 2 cm fibroid near the fundus.  Posterior aspect of the uterus densely adherent to the colon mesentery.  Somewhat dilated fluid-filled left fallopian tube.  Normal-appearing right adnexa.  Left ovary adherent to the broad ligament and sigmoid mesentery but otherwise normal-appearing.  Significant fibrosis bilaterally of the retroperitoneum.  No obvious adenopathy appreciated. On cystoscopy, bladder dome intact, good efflux noted from bilateral ureteral orifices.   12/31/2021 Pathology Results   A.  CULDESAC, POSTERIOR ADHESIONS, EXCISION: -  Serosa and subserosal fibrous soft tissue with focal acute inflammation, reactive mesothelial hyperplasia and adhesions. -  Negative for malignancy.  B. UTERUS, CERVIX, BILATERAL TUBE AND OVAARIES, HYSTERECTOMY: - -  Carcinosarcoma (malignant mixed Mullerian tumor) with full-thickness posterior myometrial involvement with focal  serosal present and extension into lower uterine segment and extensively in the cervical stroma. 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 Not currently on therapy. * Tracking Code: BO * EXAM: CT CHEST, ABDOMEN, AND PELVIS WITH CONTRAST TECHNIQUE: Multidetector CT imaging of the chest, abdomen and pelvis was performed following the standard protocol during bolus administration of intravenous  contrast. RADIATION DOSE REDUCTION: This exam was performed according to the departmental dose-optimization program which includes automated exposure control, adjustment of the mA and/or kV according to patient size and/or use of iterative reconstruction technique. CONTRAST:  OMNIPAQUE IOHEXOL 300 MG/ML  SOLN COMPARISON:  Multiple priors including CT April 03, 2022 and November 21, 2021 FINDINGS: CT CHEST FINDINGS Cardiovascular: Accessed right chest Port-A-Cath with tip at the superior cavoatrial junction. Normal caliber thoracic aorta. No central pulmonary embolus on this nondedicated study. Normal size heart. No significant pericardial effusion/thickening. Mediastinum/Nodes: No suspicious thyroid nodule. No pathologically enlarged mediastinal, hilar or axillary lymph nodes. Patulous esophagus. Lungs/Pleura: Scattered bilateral pulmonary nodules are stable from prior examination. No new suspicious pulmonary nodules or masses. For reference: -pulmonary nodule in the medial right lower lobe measuring 5 mm on image 74/4 is unchanged. -3 mm right middle lobe pulmonary nodule on image 89/4 is unchanged. Musculoskeletal: No aggressive lytic or blastic lesion of bone. Multilevel degenerative change of the spine. CT ABDOMEN PELVIS FINDINGS Hepatobiliary: No suspicious hepatic lesion. Gallbladder is unremarkable. No biliary ductal dilation. Pancreas: No pancreatic ductal dilation or evidence of acute inflammation. Spleen: No splenomegaly. Adrenals/Urinary Tract: Bilateral adrenal glands appear normal. No hydronephrosis. Kidneys demonstrate symmetric enhancement. Urinary bladder is unremarkable for degree of distension. Stomach/Bowel: Radiopaque enteric contrast material traverses the splenic flexure. Stomach is unremarkable for degree of distension. No pathologic dilation of small or large bowel. No evidence of acute bowel inflammation. Vascular/Lymphatic: Normal caliber abdominal aorta. The portal, splenic and  superior mesenteric veins are patent. New retroperitoneal, iliac side chain and pelvic sidewall adenopathy. For reference: -retrocaval right upper quadrant lymph node measures 10 mm in short axis on image 65/2 -aortocaval lymph node measures 18 mm in short axis on image 77/2 -left common iliac lymph node measures 2.3 cm in short axis on image 83/2 -left obturator lymph node measures 2.1 cm in short axis on image 94/2. Reproductive: Uterus is surgically absent with a new 2.6 x 2.2 cm nodule near the left vaginal cuff on image 98/2. Other: Mild mesenteric stranding similar prior. No discrete peritoneal or omental nodularity. Nonspecific subcutaneous edema. Musculoskeletal: Round sclerotic osseous lesion  in the L1 vertebral body is unchanged. No new aggressive lytic or blastic lesion of bone. Multilevel degenerative changes spine. IMPRESSION: 1. New abdominopelvic adenopathy, compatible with recurrent nodal metastatic disease. 2. New 2.6 cm nodule near the left vaginal cuff, concerning for recurrent local disease versus and adjacent nodal metastasis. 3. Stable round sclerotic osseous lesion in the L1 vertebral body. 4. Scattered bilateral pulmonary nodules are stable from prior examination. No new suspicious pulmonary nodules or masses. These results will be called to the ordering clinician or representative by the Radiologist Assistant, and communication documented in the PACS or [employer]. Electronically Signed   By: Maudry Mayhew M.D.   On: 10/22/2022 11:37      11/09/2022 -  Chemotherapy   Patient is on Treatment Plan : UTERINE ENDOMETRIAL Dostarlimab-gxly (500 mg) + Carboplatin (AUC 5) + Paclitaxel (175 mg/m2) q21d x 6 cycles / Dostarlimab-gxly (1000 mg) q42d x 6 cycles      01/04/2023 Imaging   CT CHEST ABDOMEN PELVIS W CONTRAST  Result Date: 01/04/2023 CLINICAL DATA:  High-grade tumor assessment endometrial cancer. Assess response to the chemotherapy. * Tracking Code: BO * EXAM: CT CHEST,  ABDOMEN, AND PELVIS WITH CONTRAST TECHNIQUE: Multidetector CT imaging of the chest, abdomen and pelvis was performed following the standard protocol during bolus administration of intravenous contrast. RADIATION DOSE REDUCTION: This exam was performed according to the departmental dose-optimization program which includes automated exposure control, adjustment of the mA and/or kV according to patient size and/or use of iterative reconstruction technique. CONTRAST:  OMNIPAQUE IOHEXOL 300 MG/ML  SOLN COMPARISON:  CT 10/22/2022. FINDINGS: CT CHEST FINDINGS Cardiovascular: Right upper chest port is accessed. Tip seen as far as the central SVC. Heart is nonenlarged. No pericardial effusion. Mild atherosclerotic changes along the aorta. Separate origin of the left vertebral artery directly from the aortic arch, normal variant. Mediastinum/Nodes: Preserved thyroid gland. Normal caliber thoracic esophagus. No specific abnormal lymph node enlargement identified in the axillary regions, hilum or mediastinum. Small nodes identified including pre cardiac are stable and not pathologic by size criteria. Lungs/Pleura: No consolidation, pneumothorax or effusion. Stable tiny nodules identified. These include 5 mm nodule left upper lobe series 6, image 40, 3 mm nodule middle lobe series 6, image 97, medial right lower lobe nodule series 6, image 80 measuring 5 mm. No new lung nodularity clearly identified. There is also some nodular which are calcified such as left lower lobe superior segment series 6, image 61. Dependent areas of atelectasis and scarring. Musculoskeletal: Curvature of the spine with some degenerative changes. CT ABDOMEN PELVIS FINDINGS Hepatobiliary: Stones in the gallbladder. Gallbladder is nondilated. Patent portal vein. No space-occupying liver lesion. Pancreas: Unremarkable. No pancreatic ductal dilatation or surrounding inflammatory changes. Spleen: Normal in size without focal abnormality.  Adrenals/Urinary Tract: Adrenal glands are preserved. No enhancing renal mass or collecting system dilatation. The ureters have normal course and caliber extending down to the bladder. Slight wall thickening of the urinary bladder, new from previous. There is increasing adjacent stranding as well in the pelvis including extending pelvic sidewall and presacral spaces. Stomach/Bowel: Oral contrast was administered. The stomach is mildly distended with a air and debris. Small bowel is nondilated. Large bowel has a normal course and caliber with moderate stool. Vascular/Lymphatic: Normal caliber aorta and IVC with scattered vascular calcifications. Several abnormal lymph nodes are once again identified along the retroperitoneum and pelvis. Specific lesions will follow up for continuity. These include left common iliac chain node which has a short axis  previously of 2.3 cm and today 1.8 cm on series 2, image 85. Retrocaval node which previously had a short axis of 10 mm, today on series 2, image 67 11 mm. Aortocaval node short axis of 18 mm and today when measured in a similar fashion 14 mm on series 2, image 79. Left obturator node short axis of 21 mm on the prior and today series 2, image 96 of 12 mm. No new nodal enlargement. Increasing retroperitoneal stranding. Reproductive: Absence of the uterus. Soft tissue node in the surgical bed which previously measured 2.6 x 2.2 cm, today is smaller on series 2, image 99 at 2.1 by 1.0 cm. Other: Small umbilical fat containing hernia. Musculoskeletal: Curvature and degenerative changes along the spine. IMPRESSION: Multifocal abnormal lymph node enlargement in the abdomen and pelvis. Many of these smaller. There is increasing adjacent stranding. Decreasing soft tissue nodule in the surgical bed in the low pelvis. No developing new mass lesion, fluid collection or lymph node enlargement. Stable small lung nodules. Electronically Signed   By: Karen Kays M.D.   On: 01/04/2023  11:43        PHYSICAL EXAMINATION: ECOG PERFORMANCE STATUS: 1 - Symptomatic but completely ambulatory  Vitals:   01/04/23 1234  BP: 108/64  Pulse: 61  Resp: 18  Temp: 98.1 F (36.7 C)  SpO2: 99%   Filed Weights   01/04/23 1234  Weight: 179 lb 6.4 oz (81.4 kg)    GENERAL:alert, no distress and comfortable   LABORATORY DATA:  I have reviewed the data as listed    Component Value Date/Time   NA 140 12/21/2022 0924   K 3.4 (L) 12/21/2022 0924   CL 101 12/21/2022 0924   CO2 29 12/21/2022 0924   GLUCOSE 143 (H) 12/21/2022 0924   BUN 16 12/21/2022 0924   CREATININE 0.71 12/21/2022 0924   CALCIUM 8.9 12/21/2022 0924   PROT 7.5 12/21/2022 0924   ALBUMIN 3.6 12/21/2022 0924   AST 20 12/21/2022 0924   ALT 10 12/21/2022 0924   ALKPHOS 54 12/21/2022 0924   BILITOT 0.5 12/21/2022 0924   GFRNONAA >60 12/21/2022 0924    No results found for: "SPEP", "UPEP"  Lab Results  Component Value Date   WBC 6.3 12/21/2022   NEUTROABS 5.3 12/21/2022   HGB 9.7 (L) 12/21/2022   HCT 30.4 (L) 12/21/2022   MCV 87.4 12/21/2022   PLT 292 12/21/2022      Chemistry      Component Value Date/Time   NA 140 12/21/2022 0924   K 3.4 (L) 12/21/2022 0924   CL 101 12/21/2022 0924   CO2 29 12/21/2022 0924   BUN 16 12/21/2022 0924   CREATININE 0.71 12/21/2022 0924      Component Value Date/Time   CALCIUM 8.9 12/21/2022 0924   ALKPHOS 54 12/21/2022 0924   AST 20 12/21/2022 0924   ALT 10 12/21/2022 0924   BILITOT 0.5 12/21/2022 0924       RADIOGRAPHIC STUDIES: I have reviewed multiple imaging studies with the patient and her daughter I have personally reviewed the radiological images as listed and agreed with the findings in the report. CT CHEST ABDOMEN PELVIS W CONTRAST  Result Date: 01/04/2023 CLINICAL DATA:  High-grade tumor assessment endometrial cancer. Assess response to the chemotherapy. * Tracking Code: BO * EXAM: CT CHEST, ABDOMEN, AND PELVIS WITH CONTRAST TECHNIQUE:  Multidetector CT imaging of the chest, abdomen and pelvis was performed following the standard protocol during bolus administration of intravenous contrast. RADIATION DOSE  REDUCTION: This exam was performed according to the departmental dose-optimization program which includes automated exposure control, adjustment of the mA and/or kV according to patient size and/or use of iterative reconstruction technique. CONTRAST:  OMNIPAQUE IOHEXOL 300 MG/ML  SOLN COMPARISON:  CT 10/22/2022. FINDINGS: CT CHEST FINDINGS Cardiovascular: Right upper chest port is accessed. Tip seen as far as the central SVC. Heart is nonenlarged. No pericardial effusion. Mild atherosclerotic changes along the aorta. Separate origin of the left vertebral artery directly from the aortic arch, normal variant. Mediastinum/Nodes: Preserved thyroid gland. Normal caliber thoracic esophagus. No specific abnormal lymph node enlargement identified in the axillary regions, hilum or mediastinum. Small nodes identified including pre cardiac are stable and not pathologic by size criteria. Lungs/Pleura: No consolidation, pneumothorax or effusion. Stable tiny nodules identified. These include 5 mm nodule left upper lobe series 6, image 40, 3 mm nodule middle lobe series 6, image 97, medial right lower lobe nodule series 6, image 80 measuring 5 mm. No new lung nodularity clearly identified. There is also some nodular which are calcified such as left lower lobe superior segment series 6, image 61. Dependent areas of atelectasis and scarring. Musculoskeletal: Curvature of the spine with some degenerative changes. CT ABDOMEN PELVIS FINDINGS Hepatobiliary: Stones in the gallbladder. Gallbladder is nondilated. Patent portal vein. No space-occupying liver lesion. Pancreas: Unremarkable. No pancreatic ductal dilatation or surrounding inflammatory changes. Spleen: Normal in size without focal abnormality. Adrenals/Urinary Tract: Adrenal glands are preserved. No  enhancing renal mass or collecting system dilatation. The ureters have normal course and caliber extending down to the bladder. Slight wall thickening of the urinary bladder, new from previous. There is increasing adjacent stranding as well in the pelvis including extending pelvic sidewall and presacral spaces. Stomach/Bowel: Oral contrast was administered. The stomach is mildly distended with a air and debris. Small bowel is nondilated. Large bowel has a normal course and caliber with moderate stool. Vascular/Lymphatic: Normal caliber aorta and IVC with scattered vascular calcifications. Several abnormal lymph nodes are once again identified along the retroperitoneum and pelvis. Specific lesions will follow up for continuity. These include left common iliac chain node which has a short axis previously of 2.3 cm and today 1.8 cm on series 2, image 85. Retrocaval node which previously had a short axis of 10 mm, today on series 2, image 67 11 mm. Aortocaval node short axis of 18 mm and today when measured in a similar fashion 14 mm on series 2, image 79. Left obturator node short axis of 21 mm on the prior and today series 2, image 96 of 12 mm. No new nodal enlargement. Increasing retroperitoneal stranding. Reproductive: Absence of the uterus. Soft tissue node in the surgical bed which previously measured 2.6 x 2.2 cm, today is smaller on series 2, image 99 at 2.1 by 1.0 cm. Other: Small umbilical fat containing hernia. Musculoskeletal: Curvature and degenerative changes along the spine. IMPRESSION: Multifocal abnormal lymph node enlargement in the abdomen and pelvis. Many of these smaller. There is increasing adjacent stranding. Decreasing soft tissue nodule in the surgical bed in the low pelvis. No developing new mass lesion, fluid collection or lymph node enlargement. Stable small lung nodules. Electronically Signed   By: Karen Kays M.D.   On: 01/04/2023 11:43

## 2023-01-04 NOTE — Assessment & Plan Note (Signed)
She had multiple investigation and antibiotics for symptom of dysuria but no infection is found This is due to atrophic vaginitis I encouraged the patient to continue to use topical estrogen cream

## 2023-01-04 NOTE — Assessment & Plan Note (Signed)
She has regular bowel movement with aggressive laxatives She will continue the same

## 2023-01-04 NOTE — Assessment & Plan Note (Signed)
I have reviewed his CT imaging with the patient and her daughter Overall, she has positive response to therapy She appears to tolerate treatment better at this time around I plan to continue a few more cycles of treatment with repeat imaging study again after cycle 6 of therapy, due around end of January

## 2023-01-05 ENCOUNTER — Telehealth: Payer: Self-pay | Admitting: Hematology and Oncology

## 2023-01-05 ENCOUNTER — Other Ambulatory Visit: Payer: Self-pay

## 2023-01-05 ENCOUNTER — Encounter: Payer: Self-pay | Admitting: Hematology and Oncology

## 2023-01-05 NOTE — Telephone Encounter (Signed)
Both patient's daughters are aware of the location change for patient treatment, they have confirmed the dates and time frames of the treatment, message is sent to infusion charge nurse regarding further details

## 2023-01-11 ENCOUNTER — Inpatient Hospital Stay: Payer: Medicare HMO

## 2023-01-11 ENCOUNTER — Inpatient Hospital Stay: Payer: Medicare HMO | Attending: Hematology and Oncology

## 2023-01-11 VITALS — BP 114/72 | HR 61 | Temp 98.0°F | Resp 17

## 2023-01-11 DIAGNOSIS — C55 Malignant neoplasm of uterus, part unspecified: Secondary | ICD-10-CM | POA: Insufficient documentation

## 2023-01-11 DIAGNOSIS — C549 Malignant neoplasm of corpus uteri, unspecified: Secondary | ICD-10-CM

## 2023-01-11 DIAGNOSIS — G620 Drug-induced polyneuropathy: Secondary | ICD-10-CM | POA: Diagnosis not present

## 2023-01-11 DIAGNOSIS — Z5111 Encounter for antineoplastic chemotherapy: Secondary | ICD-10-CM | POA: Insufficient documentation

## 2023-01-11 DIAGNOSIS — Z79899 Other long term (current) drug therapy: Secondary | ICD-10-CM | POA: Insufficient documentation

## 2023-01-11 DIAGNOSIS — Z5112 Encounter for antineoplastic immunotherapy: Secondary | ICD-10-CM | POA: Diagnosis present

## 2023-01-11 DIAGNOSIS — R600 Localized edema: Secondary | ICD-10-CM | POA: Insufficient documentation

## 2023-01-11 DIAGNOSIS — K5909 Other constipation: Secondary | ICD-10-CM | POA: Insufficient documentation

## 2023-01-11 LAB — CBC WITH DIFFERENTIAL/PLATELET
Abs Immature Granulocytes: 0.08 10*3/uL — ABNORMAL HIGH (ref 0.00–0.07)
Basophils Absolute: 0 10*3/uL (ref 0.0–0.1)
Basophils Relative: 0 %
Eosinophils Absolute: 0 10*3/uL (ref 0.0–0.5)
Eosinophils Relative: 0 %
HCT: 31.7 % — ABNORMAL LOW (ref 36.0–46.0)
Hemoglobin: 10.1 g/dL — ABNORMAL LOW (ref 12.0–15.0)
Immature Granulocytes: 2 %
Lymphocytes Relative: 21 %
Lymphs Abs: 1.1 10*3/uL (ref 0.7–4.0)
MCH: 28.1 pg (ref 26.0–34.0)
MCHC: 31.9 g/dL (ref 30.0–36.0)
MCV: 88.1 fL (ref 80.0–100.0)
Monocytes Absolute: 0.1 10*3/uL (ref 0.1–1.0)
Monocytes Relative: 1 %
Neutro Abs: 4 10*3/uL (ref 1.7–7.7)
Neutrophils Relative %: 76 %
Platelets: 261 10*3/uL (ref 150–400)
RBC: 3.6 MIL/uL — ABNORMAL LOW (ref 3.87–5.11)
RDW: 19.5 % — ABNORMAL HIGH (ref 11.5–15.5)
Smear Review: NORMAL
WBC: 5.2 10*3/uL (ref 4.0–10.5)
nRBC: 0 % (ref 0.0–0.2)

## 2023-01-11 LAB — COMPREHENSIVE METABOLIC PANEL
ALT: 10 U/L (ref 0–44)
AST: 20 U/L (ref 15–41)
Albumin: 3.8 g/dL (ref 3.5–5.0)
Alkaline Phosphatase: 47 U/L (ref 38–126)
Anion gap: 11 (ref 5–15)
BUN: 15 mg/dL (ref 8–23)
CO2: 26 mmol/L (ref 22–32)
Calcium: 9.6 mg/dL (ref 8.9–10.3)
Chloride: 99 mmol/L (ref 98–111)
Creatinine, Ser: 0.9 mg/dL (ref 0.44–1.00)
GFR, Estimated: >60 mL/min (ref >60)
Glucose, Bld: 128 mg/dL — ABNORMAL HIGH (ref 70–99)
Potassium: 3.9 mmol/L (ref 3.5–5.1)
Sodium: 136 mmol/L (ref 135–145)
Total Bilirubin: 0.5 mg/dL (ref <1.2)
Total Protein: 7.9 g/dL (ref 6.5–8.1)

## 2023-01-11 MED ORDER — LORATADINE 10 MG PO TABS
10.0000 mg | ORAL_TABLET | Freq: Once | ORAL | Status: AC
  Administered 2023-01-11: 10 mg via ORAL
  Filled 2023-01-11: qty 1

## 2023-01-11 MED ORDER — SODIUM CHLORIDE 0.9 % IV SOLN
Freq: Once | INTRAVENOUS | Status: AC
Start: 2023-01-11 — End: 2023-01-11

## 2023-01-11 MED ORDER — PALONOSETRON HCL INJECTION 0.25 MG/5ML
0.2500 mg | Freq: Once | INTRAVENOUS | Status: AC
  Administered 2023-01-11: 0.25 mg via INTRAVENOUS
  Filled 2023-01-11: qty 5

## 2023-01-11 MED ORDER — FAMOTIDINE IN NACL 20-0.9 MG/50ML-% IV SOLN
20.0000 mg | Freq: Once | INTRAVENOUS | Status: AC
  Administered 2023-01-11: 20 mg via INTRAVENOUS
  Filled 2023-01-11: qty 50

## 2023-01-11 MED ORDER — SODIUM CHLORIDE 0.9 % IV SOLN
105.0000 mg/m2 | Freq: Once | INTRAVENOUS | Status: AC
  Administered 2023-01-11: 198 mg via INTRAVENOUS
  Filled 2023-01-11: qty 33

## 2023-01-11 MED ORDER — SODIUM CHLORIDE 0.9 % IV SOLN
150.0000 mg | Freq: Once | INTRAVENOUS | Status: AC
  Administered 2023-01-11: 150 mg via INTRAVENOUS
  Filled 2023-01-11: qty 150

## 2023-01-11 MED ORDER — SODIUM CHLORIDE 0.9 % IV SOLN
430.0000 mg | Freq: Once | INTRAVENOUS | Status: AC
  Administered 2023-01-11: 430 mg via INTRAVENOUS
  Filled 2023-01-11: qty 43

## 2023-01-11 MED ORDER — HEPARIN SOD (PORK) LOCK FLUSH 100 UNIT/ML IV SOLN: 500.0000 [IU] | Freq: Once | INTRAVENOUS | Status: DC

## 2023-01-11 MED ORDER — SODIUM CHLORIDE 0.9% FLUSH
10.0000 mL | INTRAVENOUS | Status: DC | PRN
  Administered 2023-01-11: 10 mL

## 2023-01-11 MED ORDER — CETIRIZINE HCL 10 MG/ML IV SOLN
10.0000 mg | Freq: Once | INTRAVENOUS | Status: DC
Start: 2023-01-11 — End: 2023-01-11
  Filled 2023-01-11: qty 1

## 2023-01-11 MED ORDER — SODIUM CHLORIDE 0.9 % IV SOLN
500.0000 mg | Freq: Once | INTRAVENOUS | Status: AC
  Administered 2023-01-11: 500 mg via INTRAVENOUS
  Filled 2023-01-11: qty 10

## 2023-01-11 MED ORDER — HEPARIN SOD (PORK) LOCK FLUSH 100 UNIT/ML IV SOLN
500.0000 [IU] | Freq: Once | INTRAVENOUS | Status: AC | PRN
Start: 2023-01-11 — End: 2023-01-11
  Administered 2023-01-11: 500 [IU]

## 2023-01-11 MED ORDER — DEXAMETHASONE SODIUM PHOSPHATE 10 MG/ML IJ SOLN
10.0000 mg | Freq: Once | INTRAMUSCULAR | Status: AC
  Administered 2023-01-11: 10 mg via INTRAVENOUS
  Filled 2023-01-11: qty 1

## 2023-01-11 MED ORDER — SODIUM CHLORIDE 0.9% FLUSH
10.0000 mL | INTRAVENOUS | Status: DC | PRN
  Administered 2023-01-11: 10 mL via INTRAVENOUS

## 2023-01-11 NOTE — Patient Instructions (Addendum)
[facility] - A DEPT OF [facility]  Discharge Instructions: Thank you for choosing [facility] to provide your oncology and hematology care.   If you have a lab appointment with the Cancer Center, please go directly to the Cancer Center and check in at the registration area.  Wear comfortable clothing and clothing appropriate for easy access to any Portacath or PICC line.   We strive to give you quality time with your provider. You may need to reschedule your appointment if you arrive late (15 or more minutes).  Arriving late affects you and other patients whose appointments are after yours.  Also, if you miss three or more appointments without notifying the office, you may be dismissed from the clinic at the provider's discretion.      For prescription refill requests, have your pharmacy contact our office and allow 72 hours for refills to be completed.    Today you received the following chemotherapy and/or immunotherapy agents Jemperli, Paraplatin, Taxol.      To help prevent nausea and vomiting after your treatment, we encourage you to take your nausea medication as directed.  BELOW ARE SYMPTOMS THAT SHOULD BE REPORTED IMMEDIATELY: *FEVER GREATER THAN 100.4 F (38 C) OR HIGHER *CHILLS OR SWEATING *NAUSEA AND VOMITING THAT IS NOT CONTROLLED WITH YOUR NAUSEA MEDICATION *UNUSUAL SHORTNESS OF BREATH *UNUSUAL BRUISING OR BLEEDING *URINARY PROBLEMS (pain or burning when urinating, or frequent urination) *BOWEL PROBLEMS (unusual diarrhea, constipation, pain near the anus) TENDERNESS IN MOUTH AND THROAT WITH OR WITHOUT PRESENCE OF ULCERS (sore throat, sores in mouth, or a toothache) UNUSUAL RASH, SWELLING OR PAIN  UNUSUAL VAGINAL DISCHARGE OR ITCHING   Items with * indicate a potential emergency and should be followed up as soon as possible or go to the Emergency Department if any problems should occur.  Please show the CHEMOTHERAPY ALERT CARD  or IMMUNOTHERAPY ALERT CARD at check-in to the Emergency Department and triage nurse. Should you have questions after your visit or need to cancel or reschedule your appointment, please contact [facility] - A DEPT OF [facility]  [phone] and follow the prompts.  Office hours are 8:00 a.m. to 4:30 p.m. Monday - Friday. Please note that voicemails left after 4:00 p.m. may not be returned until the following business day.  We are closed weekends and major holidays. You have access to a nurse at all times for urgent questions. Please call the main number to the clinic [phone] and follow the prompts.  For any non-urgent questions, you may also contact your provider using MyChart. We now offer e-Visits for anyone 58 and older to request care online for non-urgent symptoms. For details visit [email or web address].   Also download the MyChart app! Go to the app store, search "MyChart", open the app, select [place], and log in with your MyChart username and password.

## 2023-01-11 NOTE — Progress Notes (Signed)
Cetirizine 10mg  IV changed to Loratadine PO for today's treatment.  Anola Gurney Fox, [place], BCPS, BCOP 01/11/2023 9:52 AM

## 2023-01-13 ENCOUNTER — Other Ambulatory Visit: Payer: Self-pay | Admitting: Hematology

## 2023-01-13 MED ORDER — GABAPENTIN 100 MG PO CAPS: 100.0000 mg | ORAL_CAPSULE | Freq: Two times a day (BID) | ORAL | 0 refills | Status: DC

## 2023-01-27 ENCOUNTER — Telehealth: Payer: Self-pay | Admitting: *Deleted

## 2023-01-27 NOTE — Telephone Encounter (Signed)
Pt daughter called with concerns for a bruise found on her leg. Advised that chemotherapy can lower blood levels such as platelets that can cause bruising in some pts. Advised to watch for bleeding gums, cuts that won't stop bleeding, nose bleeds, etc and if that happens to call office. Pt daughter verbalized understanding.

## 2023-01-29 ENCOUNTER — Inpatient Hospital Stay (HOSPITAL_BASED_OUTPATIENT_CLINIC_OR_DEPARTMENT_OTHER): Payer: Medicare HMO | Admitting: Hematology and Oncology

## 2023-01-29 ENCOUNTER — Encounter: Payer: Self-pay | Admitting: Hematology and Oncology

## 2023-01-29 DIAGNOSIS — C549 Malignant neoplasm of corpus uteri, unspecified: Secondary | ICD-10-CM | POA: Diagnosis not present

## 2023-01-29 DIAGNOSIS — K5909 Other constipation: Secondary | ICD-10-CM

## 2023-01-29 DIAGNOSIS — T451X5A Adverse effect of antineoplastic and immunosuppressive drugs, initial encounter: Secondary | ICD-10-CM

## 2023-01-29 DIAGNOSIS — Z5112 Encounter for antineoplastic immunotherapy: Secondary | ICD-10-CM | POA: Diagnosis not present

## 2023-01-29 DIAGNOSIS — G620 Drug-induced polyneuropathy: Secondary | ICD-10-CM

## 2023-01-29 DIAGNOSIS — R600 Localized edema: Secondary | ICD-10-CM | POA: Diagnosis not present

## 2023-01-29 MED FILL — Fosaprepitant Dimeglumine For IV Infusion 150 MG (Base Eq): INTRAVENOUS | Qty: 5 | Status: AC

## 2023-01-29 NOTE — Assessment & Plan Note (Signed)
 Neuropathy is stable She will continue gabapentin.  We will continue similar dose reduction as before

## 2023-01-29 NOTE — Assessment & Plan Note (Signed)
 This has improved with furosemide Her daughter will continue to give her furosemide as needed to keep her weight around 170 pounds

## 2023-01-29 NOTE — Assessment & Plan Note (Signed)
Her last CT imaging  showed positive response to therapy She appears to tolerate treatment better at this time around I plan to continue a few more cycles of treatment with repeat imaging study again after cycle 6 of therapy, due around end of January

## 2023-01-29 NOTE — Assessment & Plan Note (Signed)
 She has regular bowel movement with aggressive laxatives She will continue the same

## 2023-01-29 NOTE — Progress Notes (Signed)
HEMATOLOGY-ONCOLOGY ELECTRONIC VISIT PROGRESS NOTE  Patient Care Team: [name], Algis Greenhouse, DO as PCP - General (Family Medicine)  I connected with the patient via telephone conference and verified that I am speaking with the correct person using two identifiers. The patient's location is at home and I am providing care from the [facility] I discussed the limitations, risks, security and privacy concerns of performing an evaluation and management service by e-visits and the availability of in person appointments.  I also discussed with the patient that there may be a patient responsible charge related to this service. The patient expressed understanding and agreed to proceed.   ASSESSMENT & PLAN:  Uterine cancer (HCC) Her last CT imaging  showed positive response to therapy She appears to tolerate treatment better at this time around I plan to continue a few more cycles of treatment with repeat imaging study again after cycle 6 of therapy, due around end of January  Bilateral edema of lower extremity This has improved with furosemide Her daughter will continue to give her furosemide as needed to keep her weight around 170 pounds   Other constipation She has regular bowel movement with aggressive laxatives She will continue the same  Peripheral neuropathy due to chemotherapy (HCC) Neuropathy is stable She will continue gabapentin.  We will continue similar dose reduction as before  No orders of the defined types were placed in this encounter.   INTERVAL HISTORY: Please see below for problem oriented charting. The purpose of today's discussion is review toxicity prior to chemotherapy next week Patient is doing well.  She denies excessive side effects from recent treatment Denies major constipation or nausea.  Neuropathy is stable.  She continues to have bilateral lower extremity edema but appears to be responding well to furosemide.  Her weight is around 175 pounds We discussed  timing of her next imaging study and future follow-up  SUMMARY OF ONCOLOGIC HISTORY: Oncology History Overview Note  Outside path showed carcinosarcoma, PD-L1 positive, MSI stable   Uterine cancer (HCC)  07/30/2021 Initial Diagnosis   She was evaluated for post-menopausal bleeding. She reported having been seen a year previously and having a biopsy that was negative.  On exam in clinic, she was noted to have a mass present within the vagina.  This was thought to be a prolapsing fibroid and plan was made for exam under anesthesia, removal prolapsing fibroid with hysteroscopy and D&C   08/11/2021 Pathology Results   PD-L1 [phone]):                                             Does meet FDA-approval, Combined Positive Score (CPS): >=1   Pan-TRK IHC:                                              Tumor cells are Focally Positive for TRK protein expression.   MICROSATELLITE INSTABILITY:             MS-Stable   TUMOR MUTATION BURDEN:                10.8 Muts/Mb TMB-Low   OTHER BIOMARKERS:  MET amplification PIK3CA amplification MDM2 amplification CCNE1 amplification   Pertinent Negative Biomarkers Evaluated by NGS: ALK ATM BARD1 BRAF BRCA1 BRCA2 CDK12 CHECK1 CHEK2 EGFR  ERBB2 ESR1 FANCL FGFR2 FGFR3 IDH1 IDH2 KIT KRAS NRAS NTRK1 NTRK2 NTRK3 PALB2 PDGFRA RAD51B RAD51C  RAD51D RAD54L RET ROS1   Comment:  The above studies are performed and reported by [facility], [place]) as part of the [employer] (NGS) panel.  Pertinent results are summarized above.  Please see their separate reports for more detailed information  Addendum electronically signed by Dalbert Mayotte, MD on 09/03/2021 at  7:37 AM  Final Diagnosis    A.  CERVICAL MASS, EXCISION:       - CONSISTENT WITH CARCINOSARCOMA (HIGH GRADE MALIGNANT BIPHASIC TUMOR)  Electronically signed by Dalbert Mayotte, MD on 08/15/2021 at  9:53 AM  Comment    The malignant epithelial component exhibits high-grade cytomorphology  and immunostaining pattern consistent with serous carcinoma to include strong positive diffuse reactivity for p53, p16, and vimentin, focal subset CK7 and CK AE1/AE3 expression, with high Ki-67 staining (approximately 60%).  Neuroendocrine differentiation is present (CD56 negative but synaptophysin positive).  The stromal component is composed of malignant mitotically active cells and heterologous elements with focal chondroid differentiation (S100 positive).  SMA appears to decorate rare tumor cells and highlights vascular structures.  The tumor cells are non-reactive for CK20, WT1, GATA3, p40 and desmin.  Block A5 is referred to [facility], [place]) to perform molecular characterization.  These findings are shared with Dr. Jamie Kato on [id number] at [phone] hours by telephone conversation. This case received prospective intradepartmental quality assurance review.   Clinical Information    Cervical mass  Gross Description    A. Cervix Received fresh labeled with the patient's name and date of birth and "cervical mass frozen section" per container is an aggregate of red-tan friable soft tissue measuring approximately 5 x 5 x 3 cm in toto.  Representative sections are frozen for an intraoperative consultation.  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   Results rendered by Dr. Gasper Lloyd and reported to Dr. Paralee Cancel at [facility] on 08/11/2021 at 8:06 AM.  Microscopic Description    Microscopic examination supports the above diagnosis.  Single antibody immunostains are performed to include: S100, p40, p16, p53, PAX8, CK7, CK20, vimentin, CK AE1/3, GATA3, WT1, desmin, SMA, synaptophysin, CD56, and Ki-67.  All controls are appropriately reactive. [id number], [id number], [id number],  [id number], [id number], [id number] (15)          08/22/2021 Initial Diagnosis   Carcinosarcoma (HCC)   09/01/2021 Imaging   CT abdomen and pelvis There is marked enlargement of the uterus measuring 13.7 x 10 x 8 x 10.1 cm. There is inhomogeneous enhancement in the uterus consistent with malignant neoplasm.   There are enlarged lymph nodes in the retroperitoneum in the para-and paracaval regions measuring up to 12 mm in short axis. There are a few enlarged lymph nodes adjacent to the iliac vessels largest measuring 2.6 x 1.4 cm adjacent to the right external iliac vessels. Findings suggest possible metastatic lymphadenopathy. There is mild prominence of the pelvocaliceal system in the left kidney and proximal left ureter. Possibility of extrinsic compression or infiltrative process related to the uterine neoplasm in the distal course of left ureter causing mild obstruction  is not excluded.   Gallbladder stone.  Diverticulosis of colon.   Other findings as described in the body of the report.   09/01/2021 Imaging   US pelvis Large central uterine ill-defined mass approximately 7.7 cm diameter enlarging uterus likely representing the biopsy-proven neoplasm, likely of endometrial origin.   Questionable intramural leiomyoma posterior mid uterus 3.1 cm diameter.   The mass extend and the adnexal regions are poorly characterized but may potentially be better evaluated by MR imaging with and without contrast.   09/10/2021 Surgery   Pre-operative Diagnosis: carcinosarcoma of the uterus, adenopathy on imaging   Post-operative Diagnosis: same, stage IVB carcinosarcoma of the uterus   Operation: Diagnostic laparoscopy    Surgeon: Eugene Garnet MD Operative Findings: On EUA, cervix 2 cm dilated with tumor noted within the os. Cervix otherwise normal appearing. Uterus enlarged, 12 cm, with limited mobility secondary to weight. Compression of the rectum from uterus although no direct invasion appreciated. On  intra-abdominal inspection, adhesions noted between the liver and the anterior abdominal wall. Evidence of carcinomatosis involving liver surface, diaphragm, omentum, anterior abdominal wall. Surface of the uterus with obvious tumor infiltration. Small bowel free from uterus. Posteriorly, sigmoid densely adherent to the posterior uterus. Even with manipulator, unable to move the uterus anteriorly. Minimal ascites.    Given findings of stage IV disease and what would require large bowel resection and end ostomy, decision made to abort surgery in favor of neoadjuvant chemotherapy. I had discussed this as a distinct possibility when we had gotten her CT results as well as this morning prior to the surgery.    09/15/2021 Cancer Staging   Staging form: Corpus Uteri - Carcinoma and Carcinosarcoma, AJCC 8th Edition - Clinical stage from 09/15/2021: FIGO Stage IVB (cT3, cN2a, pM1) - Signed by Artis Delay, MD on 09/15/2021 Stage prefix: Initial diagnosis   09/24/2021 - 03/12/2022 Chemotherapy   Patient is on Treatment Plan : UTERINE ENDOMETRIAL Dostarlimab-gxly (500 mg) + Carboplatin (AUC 5) + Paclitaxel (175 mg/m2) q21d x 6 cycles / Dostarlimab-gxly (1000 mg) q42d x 6 cycles      10/17/2021 Procedure   Status post revision of nonfunctional right IJ port catheter, with new right IJ port catheter placed. Port is ready for use.     11/24/2021 Imaging   1. Today's study demonstrates a positive response to therapy with partial involution of large malignant appearing uterine mass. 2. Multiple prominent borderline enlarged and mildly enlarged retroperitoneal and pelvic lymph nodes are stable to minimally decreased in size compared to the prior study, potentially metastatic. No new lymphadenopathy is noted elsewhere in the chest, abdomen or pelvis. 3. Multiple small pulmonary nodules measuring 5 mm or less in the lungs. These were incidentally imaged on the prior CT of the abdomen and pelvis 09/01/2021, and at this time are  stable. The possibility of metastatic disease is not excluded, and close attention on follow-up studies is recommended. 4. Colonic diverticulosis without evidence of acute diverticulitis at this time. 5. Additional incidental findings, as above.     12/31/2021 Surgery   Robotic-assisted laparoscopic total hysterectomy with bilateral salpingo-oophorectomy, lysis of adhesions for approximately 40 minutes, cystoscopy   Findings: On EUA, moderately mobile enlarged uterus.  On intra-abdominal entry, filmy adhesions between the anterior liver and the diaphragm bilaterally.  No carcinomatosis noted on the diaphragm or liver.  Normal-appearing stomach, small bowel and omentum.  No peritoneal lesions in the lower abdominal cavity or pelvis.  No ascites.  Uterus enlarged measuring approximately 12-14 cm,  bulbous at the top.  Posterior 2 cm fibroid near the fundus.  Posterior aspect of the uterus densely adherent to the colon mesentery.  Somewhat dilated fluid-filled left fallopian tube.  Normal-appearing right adnexa.  Left ovary adherent to the broad ligament and sigmoid mesentery but otherwise normal-appearing.  Significant fibrosis bilaterally of the retroperitoneum.  No obvious adenopathy appreciated. On cystoscopy, bladder dome intact, good efflux noted from bilateral ureteral orifices.   12/31/2021 Pathology Results   A. CULDESAC, POSTERIOR ADHESIONS, EXCISION: -  Serosa and subserosal fibrous soft tissue with focal acute inflammation, reactive mesothelial hyperplasia and adhesions. -  Negative for malignancy.  B. UTERUS, CERVIX, BILATERAL TUBE AND OVAARIES, HYSTERECTOMY: - -  Carcinosarcoma (malignant mixed Mullerian tumor) with full-thickness posterior myometrial involvement with focal serosal present and extension into lower uterine segment and extensively in the cervical stroma. 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 Not currently on therapy. * Tracking Code: BO * EXAM: CT CHEST, ABDOMEN, AND PELVIS WITH CONTRAST TECHNIQUE: Multidetector CT imaging of the chest, abdomen and pelvis was performed following the standard protocol during bolus administration of intravenous contrast. RADIATION DOSE REDUCTION: This exam was performed according to the departmental dose-optimization program which includes automated exposure control, adjustment of the mA and/or kV according to patient size and/or use of iterative reconstruction technique. CONTRAST:  OMNIPAQUE IOHEXOL 300 MG/ML  SOLN COMPARISON:  Multiple priors including CT April 03, 2022 and November 21, 2021 FINDINGS: CT CHEST FINDINGS Cardiovascular: Accessed right chest Port-A-Cath with tip at the superior cavoatrial junction. Normal caliber thoracic aorta. No central pulmonary embolus on this  nondedicated study. Normal size heart. No significant pericardial effusion/thickening. Mediastinum/Nodes: No suspicious thyroid nodule. No pathologically enlarged mediastinal, hilar or axillary lymph nodes. Patulous esophagus. Lungs/Pleura: Scattered bilateral pulmonary nodules are stable from prior examination. No new suspicious pulmonary nodules or masses. For reference: -pulmonary nodule in the medial right lower lobe measuring 5 mm on image 74/4 is unchanged. -3 mm right middle lobe pulmonary nodule on image 89/4 is unchanged. Musculoskeletal: No aggressive lytic or blastic lesion of bone. Multilevel degenerative change of the spine. CT ABDOMEN PELVIS FINDINGS Hepatobiliary: No suspicious hepatic lesion. Gallbladder is unremarkable. No biliary ductal dilation. Pancreas: No pancreatic ductal dilation or evidence of acute inflammation. Spleen: No splenomegaly. Adrenals/Urinary Tract: Bilateral adrenal glands appear normal. No hydronephrosis. Kidneys demonstrate symmetric enhancement. Urinary bladder is unremarkable for degree of distension. Stomach/Bowel: Radiopaque enteric contrast material traverses the splenic flexure. Stomach is unremarkable for degree of distension. No pathologic dilation  of small or large bowel. No evidence of acute bowel inflammation. Vascular/Lymphatic: Normal caliber abdominal aorta. The portal, splenic and superior mesenteric veins are patent. New retroperitoneal, iliac side chain and pelvic sidewall adenopathy. For reference: -retrocaval right upper quadrant lymph node measures 10 mm in short axis on image 65/2 -aortocaval lymph node measures 18 mm in short axis on image 77/2 -left common iliac lymph node measures 2.3 cm in short axis on image 83/2 -left obturator lymph node measures 2.1 cm in short axis on image 94/2. Reproductive: Uterus is surgically absent with a new 2.6 x 2.2 cm nodule near the left vaginal cuff on image 98/2. Other: Mild mesenteric stranding similar prior. No  discrete peritoneal or omental nodularity. Nonspecific subcutaneous edema. Musculoskeletal: Round sclerotic osseous lesion in the L1 vertebral body is unchanged. No new aggressive lytic or blastic lesion of bone. Multilevel degenerative changes spine. IMPRESSION: 1. New abdominopelvic adenopathy, compatible with recurrent nodal metastatic disease. 2. New 2.6 cm nodule near the left vaginal cuff, concerning for recurrent local disease versus and adjacent nodal metastasis. 3. Stable round sclerotic osseous lesion in the L1 vertebral body. 4. Scattered bilateral pulmonary nodules are stable from prior examination. No new suspicious pulmonary nodules or masses. These results will be called to the ordering clinician or representative by the Radiologist Assistant, and communication documented in the PACS or [employer]. Electronically Signed   By: Maudry Mayhew M.D.   On: 10/22/2022 11:37      11/09/2022 -  Chemotherapy   Patient is on Treatment Plan : UTERINE ENDOMETRIAL Dostarlimab-gxly (500 mg) + Carboplatin (AUC 5) + Paclitaxel (175 mg/m2) q21d x 6 cycles / Dostarlimab-gxly (1000 mg) q42d x 6 cycles      01/04/2023 Imaging   CT CHEST ABDOMEN PELVIS W CONTRAST  Result Date: 01/04/2023 CLINICAL DATA:  High-grade tumor assessment endometrial cancer. Assess response to the chemotherapy. * Tracking Code: BO * EXAM: CT CHEST, ABDOMEN, AND PELVIS WITH CONTRAST TECHNIQUE: Multidetector CT imaging of the chest, abdomen and pelvis was performed following the standard protocol during bolus administration of intravenous contrast. RADIATION DOSE REDUCTION: This exam was performed according to the departmental dose-optimization program which includes automated exposure control, adjustment of the mA and/or kV according to patient size and/or use of iterative reconstruction technique. CONTRAST:  OMNIPAQUE IOHEXOL 300 MG/ML  SOLN COMPARISON:  CT 10/22/2022. FINDINGS: CT CHEST FINDINGS Cardiovascular: Right upper  chest port is accessed. Tip seen as far as the central SVC. Heart is nonenlarged. No pericardial effusion. Mild atherosclerotic changes along the aorta. Separate origin of the left vertebral artery directly from the aortic arch, normal variant. Mediastinum/Nodes: Preserved thyroid gland. Normal caliber thoracic esophagus. No specific abnormal lymph node enlargement identified in the axillary regions, hilum or mediastinum. Small nodes identified including pre cardiac are stable and not pathologic by size criteria. Lungs/Pleura: No consolidation, pneumothorax or effusion. Stable tiny nodules identified. These include 5 mm nodule left upper lobe series 6, image 40, 3 mm nodule middle lobe series 6, image 97, medial right lower lobe nodule series 6, image 80 measuring 5 mm. No new lung nodularity clearly identified. There is also some nodular which are calcified such as left lower lobe superior segment series 6, image 61. Dependent areas of atelectasis and scarring. Musculoskeletal: Curvature of the spine with some degenerative changes. CT ABDOMEN PELVIS FINDINGS Hepatobiliary: Stones in the gallbladder. Gallbladder is nondilated. Patent portal vein. No space-occupying liver lesion. Pancreas: Unremarkable. No pancreatic ductal dilatation or surrounding inflammatory changes. Spleen:  Normal in size without focal abnormality. Adrenals/Urinary Tract: Adrenal glands are preserved. No enhancing renal mass or collecting system dilatation. The ureters have normal course and caliber extending down to the bladder. Slight wall thickening of the urinary bladder, new from previous. There is increasing adjacent stranding as well in the pelvis including extending pelvic sidewall and presacral spaces. Stomach/Bowel: Oral contrast was administered. The stomach is mildly distended with a air and debris. Small bowel is nondilated. Large bowel has a normal course and caliber with moderate stool. Vascular/Lymphatic: Normal caliber aorta and  IVC with scattered vascular calcifications. Several abnormal lymph nodes are once again identified along the retroperitoneum and pelvis. Specific lesions will follow up for continuity. These include left common iliac chain node which has a short axis previously of 2.3 cm and today 1.8 cm on series 2, image 85. Retrocaval node which previously had a short axis of 10 mm, today on series 2, image 67 11 mm. Aortocaval node short axis of 18 mm and today when measured in a similar fashion 14 mm on series 2, image 79. Left obturator node short axis of 21 mm on the prior and today series 2, image 96 of 12 mm. No new nodal enlargement. Increasing retroperitoneal stranding. Reproductive: Absence of the uterus. Soft tissue node in the surgical bed which previously measured 2.6 x 2.2 cm, today is smaller on series 2, image 99 at 2.1 by 1.0 cm. Other: Small umbilical fat containing hernia. Musculoskeletal: Curvature and degenerative changes along the spine. IMPRESSION: Multifocal abnormal lymph node enlargement in the abdomen and pelvis. Many of these smaller. There is increasing adjacent stranding. Decreasing soft tissue nodule in the surgical bed in the low pelvis. No developing new mass lesion, fluid collection or lymph node enlargement. Stable small lung nodules. Electronically Signed   By: Karen Kays M.D.   On: 01/04/2023 11:43        REVIEW OF SYSTEMS:   Constitutional: Denies fevers, chills or abnormal weight loss Eyes: Denies blurriness of vision Ears, nose, mouth, throat, and face: Denies mucositis or sore throat Respiratory: Denies cough, dyspnea or wheezes Cardiovascular: Denies palpitation, chest discomfort Gastrointestinal:  Denies nausea, heartburn or change in bowel habits Skin: Denies abnormal skin rashes Lymphatics: Denies new lymphadenopathy or easy bruising Neurological:Denies numbness, tingling or new weaknesses Behavioral/Psych: Mood is stable, no new changes  Extremities: No lower  extremity edema All other systems were reviewed with the patient and are negative.  I have reviewed the past medical history, past surgical history, social history and family history with the patient and they are unchanged from previous note.  ALLERGIES:  is allergic to hydrocodone-acetaminophen and oxycodone-acetaminophen.  MEDICATIONS:  Current Outpatient Medications  Medication Sig Dispense Refill   estradiol (ESTRACE VAGINAL) 0.1 MG/GM vaginal cream Place 1 Applicatorful vaginally at bedtime. 42.5 g 12   allopurinol (ZYLOPRIM) 300 MG tablet Take 300 mg by mouth daily.     amiodarone (PACERONE) 200 MG tablet Take 1 tablet (200 mg total) by mouth daily. 30 tablet 3   apixaban (ELIQUIS) 5 MG TABS tablet Take 1 tablet (5 mg total) by mouth 2 (two) times daily. 14 tablet 0   ascorbic acid (VITAMIN C) 250 MG tablet Take 250 mg by mouth daily.     Biotin (BIOTIN 5000) 5 MG CAPS Take 5 mg by mouth daily.     dexamethasone (DECADRON) 4 MG tablet Take 2 tabs at the night before and 2 tab the morning of chemotherapy, every 3 weeks, by mouth  x 6 cycles 24 tablet 6   furosemide (LASIX) 20 MG tablet Take 1 tablet (20 mg total) by mouth daily. 30 tablet 3   gabapentin (NEURONTIN) 100 MG capsule Take 1 capsule (100 mg total) by mouth 2 (two) times daily. 60 capsule 0   lidocaine-prilocaine (EMLA) cream Apply to affected area once 30 g 3   metoprolol succinate (TOPROL-XL) 25 MG 24 hr tablet Take 25 mg by mouth daily.     Multiple Vitamins-Minerals (MULTIVITAMIN WITH MINERALS) tablet Take 1 tablet by mouth at bedtime. Centrum     ondansetron (ZOFRAN) 8 MG tablet Take 1 tablet (8 mg total) by mouth every 8 (eight) hours as needed for nausea or vomiting. Start on the third day after chemotherapy. 30 tablet 1   polyethylene glycol (MIRALAX / GLYCOLAX) 17 g packet Take 17 g by mouth daily.     potassium chloride (KLOR-CON) 10 MEQ tablet Take 10 mEq by mouth daily.     prochlorperazine (COMPAZINE) 10 MG tablet  Take 1 tablet (10 mg total) by mouth every 6 (six) hours as needed for nausea or vomiting. 30 tablet 1   simvastatin (ZOCOR) 10 MG tablet Take 10 mg by mouth daily at 6 PM.     No current facility-administered medications for this visit.    PHYSICAL EXAMINATION: ECOG PERFORMANCE STATUS: 1 - Symptomatic but completely ambulatory  LABORATORY DATA:  I have reviewed the data as listed    Latest Ref Rng & Units 01/11/2023    8:35 AM 12/21/2022    9:24 AM 11/30/2022    9:15 AM  CMP  Glucose 70 - 99 mg/dL 119  147  829   BUN 8 - 23 mg/dL 15  16  17    Creatinine 0.44 - 1.00 mg/dL 5.62  1.30  8.65   Sodium 135 - 145 mmol/L 136  140  141   Potassium 3.5 - 5.1 mmol/L 3.9  3.4  3.5   Chloride 98 - 111 mmol/L 99  101  103   CO2 22 - 32 mmol/L 26  29  30    Calcium 8.9 - 10.3 mg/dL 9.6  8.9  9.4   Total Protein 6.5 - 8.1 g/dL 7.9  7.5  6.8   Total Bilirubin <1.2 mg/dL 0.5  0.5  0.5   Alkaline Phos 38 - 126 U/L 47  54  46   AST 15 - 41 U/L 20  20  26    ALT 0 - 44 U/L 10  10  14      Lab Results  Component Value Date   WBC 5.2 01/11/2023   HGB 10.1 (L) 01/11/2023   HCT 31.7 (L) 01/11/2023   MCV 88.1 01/11/2023   PLT 261 01/11/2023   NEUTROABS 4.0 01/11/2023     RADIOGRAPHIC STUDIES: I have personally reviewed the radiological images as listed and agreed with the findings in the report. CT CHEST ABDOMEN PELVIS W CONTRAST Result Date: 01/04/2023 CLINICAL DATA:  High-grade tumor assessment endometrial cancer. Assess response to the chemotherapy. * Tracking Code: BO * EXAM: CT CHEST, ABDOMEN, AND PELVIS WITH CONTRAST TECHNIQUE: Multidetector CT imaging of the chest, abdomen and pelvis was performed following the standard protocol during bolus administration of intravenous contrast. RADIATION DOSE REDUCTION: This exam was performed according to the departmental dose-optimization program which includes automated exposure control, adjustment of the mA and/or kV according to patient size and/or  use of iterative reconstruction technique. CONTRAST:  OMNIPAQUE IOHEXOL 300 MG/ML  SOLN COMPARISON:  CT 10/22/2022.  FINDINGS: CT CHEST FINDINGS Cardiovascular: Right upper chest port is accessed. Tip seen as far as the central SVC. Heart is nonenlarged. No pericardial effusion. Mild atherosclerotic changes along the aorta. Separate origin of the left vertebral artery directly from the aortic arch, normal variant. Mediastinum/Nodes: Preserved thyroid gland. Normal caliber thoracic esophagus. No specific abnormal lymph node enlargement identified in the axillary regions, hilum or mediastinum. Small nodes identified including pre cardiac are stable and not pathologic by size criteria. Lungs/Pleura: No consolidation, pneumothorax or effusion. Stable tiny nodules identified. These include 5 mm nodule left upper lobe series 6, image 40, 3 mm nodule middle lobe series 6, image 97, medial right lower lobe nodule series 6, image 80 measuring 5 mm. No new lung nodularity clearly identified. There is also some nodular which are calcified such as left lower lobe superior segment series 6, image 61. Dependent areas of atelectasis and scarring. Musculoskeletal: Curvature of the spine with some degenerative changes. CT ABDOMEN PELVIS FINDINGS Hepatobiliary: Stones in the gallbladder. Gallbladder is nondilated. Patent portal vein. No space-occupying liver lesion. Pancreas: Unremarkable. No pancreatic ductal dilatation or surrounding inflammatory changes. Spleen: Normal in size without focal abnormality. Adrenals/Urinary Tract: Adrenal glands are preserved. No enhancing renal mass or collecting system dilatation. The ureters have normal course and caliber extending down to the bladder. Slight wall thickening of the urinary bladder, new from previous. There is increasing adjacent stranding as well in the pelvis including extending pelvic sidewall and presacral spaces. Stomach/Bowel: Oral contrast was administered. The stomach  is mildly distended with a air and debris. Small bowel is nondilated. Large bowel has a normal course and caliber with moderate stool. Vascular/Lymphatic: Normal caliber aorta and IVC with scattered vascular calcifications. Several abnormal lymph nodes are once again identified along the retroperitoneum and pelvis. Specific lesions will follow up for continuity. These include left common iliac chain node which has a short axis previously of 2.3 cm and today 1.8 cm on series 2, image 85. Retrocaval node which previously had a short axis of 10 mm, today on series 2, image 67 11 mm. Aortocaval node short axis of 18 mm and today when measured in a similar fashion 14 mm on series 2, image 79. Left obturator node short axis of 21 mm on the prior and today series 2, image 96 of 12 mm. No new nodal enlargement. Increasing retroperitoneal stranding. Reproductive: Absence of the uterus. Soft tissue node in the surgical bed which previously measured 2.6 x 2.2 cm, today is smaller on series 2, image 99 at 2.1 by 1.0 cm. Other: Small umbilical fat containing hernia. Musculoskeletal: Curvature and degenerative changes along the spine. IMPRESSION: Multifocal abnormal lymph node enlargement in the abdomen and pelvis. Many of these smaller. There is increasing adjacent stranding. Decreasing soft tissue nodule in the surgical bed in the low pelvis. No developing new mass lesion, fluid collection or lymph node enlargement. Stable small lung nodules. Electronically Signed   By: Karen Kays M.D.   On: 01/04/2023 11:43    I discussed the assessment and treatment plan with the patient. The patient was provided an opportunity to ask questions and all were answered. The patient agreed with the plan and demonstrated an understanding of the instructions. The patient was advised to call back or seek an in-person evaluation if the symptoms worsen or if the condition fails to improve as anticipated.    I spent 30 minutes for the  appointment reviewing test results, discuss management and coordination of care.  Zlata Alcaide  Bertis Ruddy, MD 01/29/2023 11:17 AM

## 2023-02-01 ENCOUNTER — Inpatient Hospital Stay: Payer: Medicare HMO

## 2023-02-01 VITALS — BP 111/87 | HR 62 | Temp 97.8°F | Resp 18 | Ht 66.0 in | Wt 178.0 lb

## 2023-02-01 DIAGNOSIS — C549 Malignant neoplasm of corpus uteri, unspecified: Secondary | ICD-10-CM

## 2023-02-01 DIAGNOSIS — Z5112 Encounter for antineoplastic immunotherapy: Secondary | ICD-10-CM | POA: Diagnosis not present

## 2023-02-01 LAB — CBC WITH DIFFERENTIAL (CANCER CENTER ONLY)
Abs Immature Granulocytes: 0.01 10*3/uL (ref 0.00–0.07)
Basophils Absolute: 0 10*3/uL (ref 0.0–0.1)
Basophils Relative: 0 %
Eosinophils Absolute: 0 10*3/uL (ref 0.0–0.5)
Eosinophils Relative: 0 %
HCT: 31.6 % — ABNORMAL LOW (ref 36.0–46.0)
Hemoglobin: 10.1 g/dL — ABNORMAL LOW (ref 12.0–15.0)
Immature Granulocytes: 0 %
Lymphocytes Relative: 24 %
Lymphs Abs: 1 10*3/uL (ref 0.7–4.0)
MCH: 28.5 pg (ref 26.0–34.0)
MCHC: 32 g/dL (ref 30.0–36.0)
MCV: 89.3 fL (ref 80.0–100.0)
Monocytes Absolute: 0.1 10*3/uL (ref 0.1–1.0)
Monocytes Relative: 2 %
Neutro Abs: 3.2 10*3/uL (ref 1.7–7.7)
Neutrophils Relative %: 74 %
Platelet Count: 293 10*3/uL (ref 150–400)
RBC: 3.54 MIL/uL — ABNORMAL LOW (ref 3.87–5.11)
RDW: 20.7 % — ABNORMAL HIGH (ref 11.5–15.5)
WBC Count: 4.3 10*3/uL (ref 4.0–10.5)
nRBC: 0 % (ref 0.0–0.2)

## 2023-02-01 LAB — CMP (CANCER CENTER ONLY)
ALT: 9 U/L (ref 0–44)
AST: 21 U/L (ref 15–41)
Albumin: 3.9 g/dL (ref 3.5–5.0)
Alkaline Phosphatase: 48 U/L (ref 38–126)
Anion gap: 10 (ref 5–15)
BUN: 21 mg/dL (ref 8–23)
CO2: 29 mmol/L (ref 22–32)
Calcium: 9.5 mg/dL (ref 8.9–10.3)
Chloride: 100 mmol/L (ref 98–111)
Creatinine: 0.94 mg/dL (ref 0.44–1.00)
GFR, Estimated: >60 mL/min (ref >60)
Glucose, Bld: 129 mg/dL — ABNORMAL HIGH (ref 70–99)
Potassium: 3.7 mmol/L (ref 3.5–5.1)
Sodium: 139 mmol/L (ref 135–145)
Total Bilirubin: 0.5 mg/dL (ref <1.2)
Total Protein: 7.5 g/dL (ref 6.5–8.1)

## 2023-02-01 MED ORDER — SODIUM CHLORIDE 0.9 % IV SOLN
500.0000 mg | Freq: Once | INTRAVENOUS | Status: AC
  Administered 2023-02-01: 500 mg via INTRAVENOUS
  Filled 2023-02-01: qty 10

## 2023-02-01 MED ORDER — DEXAMETHASONE SODIUM PHOSPHATE 10 MG/ML IJ SOLN
10.0000 mg | Freq: Once | INTRAMUSCULAR | Status: AC
  Administered 2023-02-01: 10 mg via INTRAVENOUS
  Filled 2023-02-01: qty 1

## 2023-02-01 MED ORDER — PALONOSETRON HCL INJECTION 0.25 MG/5ML
0.2500 mg | Freq: Once | INTRAVENOUS | Status: AC
  Administered 2023-02-01: 0.25 mg via INTRAVENOUS
  Filled 2023-02-01: qty 5

## 2023-02-01 MED ORDER — SODIUM CHLORIDE 0.9 % IV SOLN
Freq: Once | INTRAVENOUS | Status: AC
Start: 2023-02-01 — End: 2023-02-01

## 2023-02-01 MED ORDER — SODIUM CHLORIDE 0.9 % IV SOLN
150.0000 mg | Freq: Once | INTRAVENOUS | Status: AC
  Administered 2023-02-01: 150 mg via INTRAVENOUS
  Filled 2023-02-01: qty 150

## 2023-02-01 MED ORDER — FAMOTIDINE IN NACL 20-0.9 MG/50ML-% IV SOLN
20.0000 mg | Freq: Once | INTRAVENOUS | Status: AC
  Administered 2023-02-01: 20 mg via INTRAVENOUS
  Filled 2023-02-01: qty 50

## 2023-02-01 MED ORDER — SODIUM CHLORIDE 0.9 % IV SOLN
105.0000 mg/m2 | Freq: Once | INTRAVENOUS | Status: AC
  Administered 2023-02-01: 198 mg via INTRAVENOUS
  Filled 2023-02-01: qty 33

## 2023-02-01 MED ORDER — CETIRIZINE HCL 10 MG/ML IV SOLN
10.0000 mg | Freq: Once | INTRAVENOUS | Status: AC
  Administered 2023-02-01: 10 mg via INTRAVENOUS
  Filled 2023-02-01: qty 1

## 2023-02-01 MED ORDER — SODIUM CHLORIDE 0.9% FLUSH
10.0000 mL | Freq: Once | INTRAVENOUS | Status: AC
Start: 2023-02-01 — End: 2023-02-01
  Administered 2023-02-01: 10 mL

## 2023-02-01 MED ORDER — SODIUM CHLORIDE 0.9 % IV SOLN
428.5000 mg | Freq: Once | INTRAVENOUS | Status: AC
  Administered 2023-02-01: 430 mg via INTRAVENOUS
  Filled 2023-02-01: qty 43

## 2023-02-01 MED ORDER — SODIUM CHLORIDE 0.9% FLUSH
10.0000 mL | INTRAVENOUS | Status: DC | PRN
  Administered 2023-02-01: 10 mL

## 2023-02-01 MED ORDER — HEPARIN SOD (PORK) LOCK FLUSH 100 UNIT/ML IV SOLN
500.0000 [IU] | Freq: Once | INTRAVENOUS | Status: AC | PRN
  Administered 2023-02-01: 500 [IU]

## 2023-02-01 NOTE — Patient Instructions (Signed)
[facility] - A DEPT OF [facility]  Discharge Instructions: Thank you for choosing [facility] to provide your oncology and hematology care.   If you have a lab appointment with the Cancer Center, please go directly to the Cancer Center and check in at the registration area.  Wear comfortable clothing and clothing appropriate for easy access to any Portacath or PICC line.   We strive to give you quality time with your provider. You may need to reschedule your appointment if you arrive late (15 or more minutes).  Arriving late affects you and other patients whose appointments are after yours.  Also, if you miss three or more appointments without notifying the office, you may be dismissed from the clinic at the provider's discretion.      For prescription refill requests, have your pharmacy contact our office and allow 72 hours for refills to be completed.    Today you received the following chemotherapy and/or immunotherapy agents Jemperli, Paraplatin, Taxol.      To help prevent nausea and vomiting after your treatment, we encourage you to take your nausea medication as directed.  BELOW ARE SYMPTOMS THAT SHOULD BE REPORTED IMMEDIATELY: *FEVER GREATER THAN 100.4 F (38 C) OR HIGHER *CHILLS OR SWEATING *NAUSEA AND VOMITING THAT IS NOT CONTROLLED WITH YOUR NAUSEA MEDICATION *UNUSUAL SHORTNESS OF BREATH *UNUSUAL BRUISING OR BLEEDING *URINARY PROBLEMS (pain or burning when urinating, or frequent urination) *BOWEL PROBLEMS (unusual diarrhea, constipation, pain near the anus) TENDERNESS IN MOUTH AND THROAT WITH OR WITHOUT PRESENCE OF ULCERS (sore throat, sores in mouth, or a toothache) UNUSUAL RASH, SWELLING OR PAIN  UNUSUAL VAGINAL DISCHARGE OR ITCHING   Items with * indicate a potential emergency and should be followed up as soon as possible or go to the Emergency Department if any problems should occur.  Please show the CHEMOTHERAPY ALERT CARD  or IMMUNOTHERAPY ALERT CARD at check-in to the Emergency Department and triage nurse. Should you have questions after your visit or need to cancel or reschedule your appointment, please contact [facility] - A DEPT OF [facility]  [phone] and follow the prompts.  Office hours are 8:00 a.m. to 4:30 p.m. Monday - Friday. Please note that voicemails left after 4:00 p.m. may not be returned until the following business day.  We are closed weekends and major holidays. You have access to a nurse at all times for urgent questions. Please call the main number to the clinic [phone] and follow the prompts.  For any non-urgent questions, you may also contact your provider using MyChart. We now offer e-Visits for anyone 5 and older to request care online for non-urgent symptoms. For details visit [email or web address].   Also download the MyChart app! Go to the app store, search "MyChart", open the app, select [facility], and log in with your MyChart username and password.

## 2023-02-15 ENCOUNTER — Encounter: Payer: Self-pay | Admitting: Hematology and Oncology

## 2023-02-19 MED FILL — Fosaprepitant Dimeglumine For IV Infusion 150 MG (Base Eq): INTRAVENOUS | Qty: 5 | Status: AC

## 2023-02-22 ENCOUNTER — Inpatient Hospital Stay: Payer: Medicare HMO

## 2023-02-22 ENCOUNTER — Encounter: Payer: Self-pay | Admitting: Hematology and Oncology

## 2023-02-22 ENCOUNTER — Inpatient Hospital Stay: Payer: Medicare HMO | Attending: Hematology and Oncology | Admitting: Hematology and Oncology

## 2023-02-22 VITALS — BP 125/68 | HR 71 | Temp 98.0°F | Resp 17 | Ht 66.0 in | Wt 191.0 lb

## 2023-02-22 DIAGNOSIS — G620 Drug-induced polyneuropathy: Secondary | ICD-10-CM | POA: Diagnosis not present

## 2023-02-22 DIAGNOSIS — D649 Anemia, unspecified: Secondary | ICD-10-CM | POA: Insufficient documentation

## 2023-02-22 DIAGNOSIS — D638 Anemia in other chronic diseases classified elsewhere: Secondary | ICD-10-CM | POA: Diagnosis not present

## 2023-02-22 DIAGNOSIS — C549 Malignant neoplasm of corpus uteri, unspecified: Secondary | ICD-10-CM

## 2023-02-22 DIAGNOSIS — Z5112 Encounter for antineoplastic immunotherapy: Secondary | ICD-10-CM | POA: Diagnosis present

## 2023-02-22 DIAGNOSIS — Z5111 Encounter for antineoplastic chemotherapy: Secondary | ICD-10-CM | POA: Diagnosis present

## 2023-02-22 DIAGNOSIS — C541 Malignant neoplasm of endometrium: Secondary | ICD-10-CM | POA: Diagnosis present

## 2023-02-22 DIAGNOSIS — Z79899 Other long term (current) drug therapy: Secondary | ICD-10-CM | POA: Diagnosis not present

## 2023-02-22 DIAGNOSIS — T451X5A Adverse effect of antineoplastic and immunosuppressive drugs, initial encounter: Secondary | ICD-10-CM

## 2023-02-22 DIAGNOSIS — R600 Localized edema: Secondary | ICD-10-CM | POA: Diagnosis not present

## 2023-02-22 LAB — CBC WITH DIFFERENTIAL (CANCER CENTER ONLY)
Abs Immature Granulocytes: 0.01 10*3/uL (ref 0.00–0.07)
Basophils Absolute: 0 10*3/uL (ref 0.0–0.1)
Basophils Relative: 0 %
Eosinophils Absolute: 0 10*3/uL (ref 0.0–0.5)
Eosinophils Relative: 0 %
HCT: 29.2 % — ABNORMAL LOW (ref 36.0–46.0)
Hemoglobin: 9.2 g/dL — ABNORMAL LOW (ref 12.0–15.0)
Immature Granulocytes: 0 %
Lymphocytes Relative: 23 %
Lymphs Abs: 1 10*3/uL (ref 0.7–4.0)
MCH: 28.5 pg (ref 26.0–34.0)
MCHC: 31.5 g/dL (ref 30.0–36.0)
MCV: 90.4 fL (ref 80.0–100.0)
Monocytes Absolute: 0.1 10*3/uL (ref 0.1–1.0)
Monocytes Relative: 3 %
Neutro Abs: 3.3 10*3/uL (ref 1.7–7.7)
Neutrophils Relative %: 74 %
Platelet Count: 211 10*3/uL (ref 150–400)
RBC: 3.23 MIL/uL — ABNORMAL LOW (ref 3.87–5.11)
RDW: 20.4 % — ABNORMAL HIGH (ref 11.5–15.5)
WBC Count: 4.5 10*3/uL (ref 4.0–10.5)
nRBC: 0 % (ref 0.0–0.2)

## 2023-02-22 LAB — CMP (CANCER CENTER ONLY)
ALT: 9 U/L (ref 0–44)
AST: 21 U/L (ref 15–41)
Albumin: 3.7 g/dL (ref 3.5–5.0)
Alkaline Phosphatase: 48 U/L (ref 38–126)
Anion gap: 10 (ref 5–15)
BUN: 13 mg/dL (ref 8–23)
CO2: 25 mmol/L (ref 22–32)
Calcium: 9.5 mg/dL (ref 8.9–10.3)
Chloride: 102 mmol/L (ref 98–111)
Creatinine: 0.72 mg/dL (ref 0.44–1.00)
GFR, Estimated: >60 mL/min (ref >60)
Glucose, Bld: 126 mg/dL — ABNORMAL HIGH (ref 70–99)
Potassium: 4.1 mmol/L (ref 3.5–5.1)
Sodium: 137 mmol/L (ref 135–145)
Total Bilirubin: 0.5 mg/dL (ref 0.0–1.2)
Total Protein: 7.4 g/dL (ref 6.5–8.1)

## 2023-02-22 LAB — TSH: TSH: 0.747 u[IU]/mL (ref 0.350–4.500)

## 2023-02-22 MED ORDER — SODIUM CHLORIDE 0.9 % IV SOLN
105.0000 mg/m2 | Freq: Once | INTRAVENOUS | Status: AC
  Administered 2023-02-22: 198 mg via INTRAVENOUS
  Filled 2023-02-22: qty 33

## 2023-02-22 MED ORDER — SODIUM CHLORIDE 0.9 % IV SOLN
150.0000 mg | Freq: Once | INTRAVENOUS | Status: AC
  Administered 2023-02-22: 150 mg via INTRAVENOUS
  Filled 2023-02-22: qty 150

## 2023-02-22 MED ORDER — FAMOTIDINE IN NACL 20-0.9 MG/50ML-% IV SOLN
20.0000 mg | Freq: Once | INTRAVENOUS | Status: AC
Start: 2023-02-22 — End: 2023-02-22
  Administered 2023-02-22: 20 mg via INTRAVENOUS
  Filled 2023-02-22: qty 50

## 2023-02-22 MED ORDER — HEPARIN SOD (PORK) LOCK FLUSH 100 UNIT/ML IV SOLN
500.0000 [IU] | Freq: Once | INTRAVENOUS | Status: AC | PRN
  Administered 2023-02-22: 500 [IU]

## 2023-02-22 MED ORDER — DEXAMETHASONE SODIUM PHOSPHATE 10 MG/ML IJ SOLN
10.0000 mg | Freq: Once | INTRAMUSCULAR | Status: AC
  Administered 2023-02-22: 10 mg via INTRAVENOUS
  Filled 2023-02-22: qty 1

## 2023-02-22 MED ORDER — PALONOSETRON HCL INJECTION 0.25 MG/5ML
0.2500 mg | Freq: Once | INTRAVENOUS | Status: AC
  Administered 2023-02-22: 0.25 mg via INTRAVENOUS
  Filled 2023-02-22: qty 5

## 2023-02-22 MED ORDER — SODIUM CHLORIDE 0.9 % IV SOLN
428.5000 mg | Freq: Once | INTRAVENOUS | Status: AC
Start: 2023-02-22 — End: 2023-02-22
  Administered 2023-02-22: 430 mg via INTRAVENOUS
  Filled 2023-02-22: qty 43

## 2023-02-22 MED ORDER — SODIUM CHLORIDE 0.9% FLUSH
10.0000 mL | INTRAVENOUS | Status: DC | PRN
  Administered 2023-02-22: 10 mL

## 2023-02-22 MED ORDER — SODIUM CHLORIDE 0.9 % IV SOLN: Freq: Once | INTRAVENOUS | Status: AC

## 2023-02-22 MED ORDER — SODIUM CHLORIDE 0.9 % IV SOLN
500.0000 mg | Freq: Once | INTRAVENOUS | Status: AC
Start: 2023-02-22 — End: 2023-02-22
  Administered 2023-02-22: 500 mg via INTRAVENOUS
  Filled 2023-02-22: qty 10

## 2023-02-22 MED ORDER — CETIRIZINE HCL 10 MG/ML IV SOLN
10.0000 mg | Freq: Once | INTRAVENOUS | Status: AC
  Administered 2023-02-22: 10 mg via INTRAVENOUS
  Filled 2023-02-22: qty 1

## 2023-02-22 MED ORDER — SODIUM CHLORIDE 0.9% FLUSH
10.0000 mL | Freq: Once | INTRAVENOUS | Status: AC
  Administered 2023-02-22: 10 mL

## 2023-02-22 NOTE — Assessment & Plan Note (Signed)
 This is stable Her daughter will continue to give her furosemide as needed to keep her weight around 170 pounds

## 2023-02-22 NOTE — Patient Instructions (Signed)
 [facility]  Discharge Instructions: Thank you for choosing [facility] to provide your oncology and hematology care.   If you have a lab appointment with the Cancer Center, please go directly to the Cancer Center and check in at the registration area.  Wear comfortable clothing and clothing appropriate for easy access to any Portacath or PICC line.   We strive to give you quality time with your provider. You may need to reschedule your appointment if you arrive late (15 or more minutes).  Arriving late affects you and other patients whose appointments are after yours.  Also, if you miss three or more appointments without notifying the office, you may be dismissed from the clinic at the provider's discretion.      For prescription refill requests, have your pharmacy contact our office and allow 72 hours for refills to be completed.    Today you received the following chemotherapy and/or immunotherapy agents Jemperli, Paraplatin, Taxol.      To help prevent nausea and vomiting after your treatment, we encourage you to take your nausea medication as directed.  BELOW ARE SYMPTOMS THAT SHOULD BE REPORTED IMMEDIATELY: *FEVER GREATER THAN 100.4 F (38 C) OR HIGHER *CHILLS OR SWEATING *NAUSEA AND VOMITING THAT IS NOT CONTROLLED WITH YOUR NAUSEA MEDICATION *UNUSUAL SHORTNESS OF BREATH *UNUSUAL BRUISING OR BLEEDING *URINARY PROBLEMS (pain or burning when urinating, or frequent urination) *BOWEL PROBLEMS (unusual diarrhea, constipation, pain near the anus) TENDERNESS IN MOUTH AND THROAT WITH OR WITHOUT PRESENCE OF ULCERS (sore throat, sores in mouth, or a toothache) UNUSUAL RASH, SWELLING OR PAIN  UNUSUAL VAGINAL DISCHARGE OR ITCHING   Items with * indicate a potential emergency and should be followed up as soon as possible or go to the Emergency Department if any problems should occur.  Please show the CHEMOTHERAPY ALERT CARD  or IMMUNOTHERAPY ALERT CARD at check-in to the Emergency Department and triage nurse. Should you have questions after your visit or need to cancel or reschedule your appointment, please contact [facility] - [facility]  [phone] and follow the prompts.  Office hours are 8:00 a.m. to 4:30 p.m. Monday - Friday. Please note that voicemails left after 4:00 p.m. may not be returned until the following business day.  We are closed weekends and major holidays. You have access to a nurse at all times for urgent questions. Please call the main number to the clinic [phone] and follow the prompts.  For any non-urgent questions, you may also contact your provider using MyChart. We now offer e-Visits for anyone 5 and older to request care online for non-urgent symptoms. For details visit [email or web address].   Also download the MyChart app! Go to the app store, search "MyChart", open the app, select [facility], and log in with your MyChart username and password.

## 2023-02-22 NOTE — Assessment & Plan Note (Signed)
 Neuropathy is stable She will continue gabapentin.  We will continue similar dose reduction as before

## 2023-02-22 NOTE — Assessment & Plan Note (Signed)
 Her last CT imaging in November  showed positive response to therapy She appears to tolerate treatment better at this time around I plan to continue a few more cycles of treatment with repeat imaging study again after cycle 6 of therapy, due around Feb

## 2023-02-22 NOTE — Assessment & Plan Note (Signed)
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 The patient denies recent history of bleeding such as epistaxis, hematuria or hematochezia. She is asymptomatic from the anemia. We will observe for now.  She does not require transfusion now. I do not recommend any further work-up at this time.   

## 2023-02-22 NOTE — Progress Notes (Signed)
 [facility] OFFICE PROGRESS NOTE  Patient Care Team: Hassell Donnice DASEN, DO as PCP - General (Family Medicine)  ASSESSMENT & PLAN:  Uterine cancer [facility]) Her last CT imaging in November  showed positive response to therapy She appears to tolerate treatment better at this time around I plan to continue a few more cycles of treatment with repeat imaging study again after cycle 6 of therapy, due around Feb  Anemia, chronic disease This is likely anemia of chronic disease. The patient denies recent history of bleeding such as epistaxis, hematuria or hematochezia. She is asymptomatic from the anemia. We will observe for now.  She does not require transfusion now. I do not recommend any further work-up at this time.    Bilateral edema of lower extremity This is stable Her daughter will continue to give her furosemide  as needed to keep her weight around 170 pounds   Peripheral neuropathy due to chemotherapy (HCC) Neuropathy is stable She will continue gabapentin .  We will continue similar dose reduction as before  Orders Placed This Encounter  Procedures   CT CHEST ABDOMEN PELVIS W CONTRAST    Standing Status:   Future    Expected Date:   03/29/2023    Expiration Date:   02/22/2024    Scheduling Instructions:     No need oral contrast    If indicated for the ordered procedure, I authorize the administration of contrast media per Radiology protocol:   Yes    Does the patient have a contrast media/X-ray dye allergy?:   No    Preferred imaging location?:   [facility]    If indicated for the ordered procedure, I authorize the administration of oral contrast media per Radiology protocol:   Yes   CBC with Differential (Cancer Center Only)    Standing Status:   Future    Expected Date:   03/15/2023    Expiration Date:   03/14/2024   CMP (Cancer Center only)    Standing Status:   Future    Expected Date:   03/15/2023    Expiration Date:   03/14/2024   T4    Standing Status:    Future    Expected Date:   03/15/2023    Expiration Date:   03/14/2024   TSH    Standing Status:   Future    Expected Date:   03/15/2023    Expiration Date:   03/14/2024    All questions were answered. The patient knows to call the clinic with any problems, questions or concerns. The total time spent in the appointment was 30 minutes encounter with patients including review of chart and various tests results, discussions about plan of care and coordination of care plan   Sarah Bedford, MD 02/22/2023 9:38 AM  INTERVAL HISTORY: Please see below for problem oriented charting. she returns for chemotherapy follow-up She is doing well She denies new side effects from treatment No recent bleeding Neuropathy is stable We discussed future follow-up and timing of next imaging  REVIEW OF SYSTEMS:   Constitutional: Denies fevers, chills or abnormal weight loss Eyes: Denies blurriness of vision Ears, nose, mouth, throat, and face: Denies mucositis or sore throat Respiratory: Denies cough, dyspnea or wheezes Cardiovascular: Denies palpitation, chest discomfort or lower extremity swelling Gastrointestinal:  Denies nausea, heartburn or change in bowel habits Skin: Denies abnormal skin rashes Lymphatics: Denies new lymphadenopathy or easy bruising Neurological:Denies numbness, tingling or new weaknesses Behavioral/Psych: Mood is stable, no new changes  All  other systems were reviewed with the patient and are negative.  I have reviewed the past medical history, past surgical history, social history and family history with the patient and they are unchanged from previous note.  ALLERGIES:  is allergic to hydrocodone-acetaminophen  and oxycodone-acetaminophen .  MEDICATIONS:  Current Outpatient Medications  Medication Sig Dispense Refill   estradiol  (ESTRACE  VAGINAL) 0.1 MG/GM vaginal cream Place 1 Applicatorful vaginally at bedtime. 42.5 g 12   allopurinol (ZYLOPRIM) 300 MG tablet Take 300 mg by mouth  daily.     amiodarone  (PACERONE ) 200 MG tablet Take 1 tablet (200 mg total) by mouth daily. 30 tablet 3   apixaban  (ELIQUIS ) 5 MG TABS tablet Take 1 tablet (5 mg total) by mouth 2 (two) times daily. 14 tablet 0   ascorbic acid  (VITAMIN C ) 250 MG tablet Take 250 mg by mouth daily.     Biotin (BIOTIN 5000) 5 MG CAPS Take 5 mg by mouth daily.     dexamethasone  (DECADRON ) 4 MG tablet Take 2 tabs at the night before and 2 tab the morning of chemotherapy, every 3 weeks, by mouth x 6 cycles 24 tablet 6   furosemide  (LASIX ) 20 MG tablet Take 1 tablet (20 mg total) by mouth daily. 30 tablet 3   gabapentin  (NEURONTIN ) 100 MG capsule Take 1 capsule (100 mg total) by mouth 2 (two) times daily. 60 capsule 0   lidocaine -prilocaine  (EMLA ) cream Apply to affected area once 30 g 3   metoprolol  succinate (TOPROL -XL) 25 MG 24 hr tablet Take 25 mg by mouth daily.     Multiple Vitamins-Minerals (MULTIVITAMIN WITH MINERALS) tablet Take 1 tablet by mouth at bedtime. Centrum     ondansetron  (ZOFRAN ) 8 MG tablet Take 1 tablet (8 mg total) by mouth every 8 (eight) hours as needed for nausea or vomiting. Start on the third day after chemotherapy. 30 tablet 1   polyethylene glycol (MIRALAX  / GLYCOLAX ) 17 g packet Take 17 g by mouth daily.     potassium chloride  (KLOR-CON ) 10 MEQ tablet Take 10 mEq by mouth daily.     prochlorperazine  (COMPAZINE ) 10 MG tablet Take 1 tablet (10 mg total) by mouth every 6 (six) hours as needed for nausea or vomiting. 30 tablet 1   simvastatin (ZOCOR) 10 MG tablet Take 10 mg by mouth daily at 6 PM.     No current facility-administered medications for this visit.   Facility-Administered Medications Ordered in Other Visits  Medication Dose Route Frequency Provider Last Rate Last Admin   0.9 %  sodium chloride  infusion   Intravenous Once Lonn, Bram Hottel, MD       CARBOplatin  (PARAPLATIN ) 430 mg in sodium chloride  0.9 % 250 mL chemo infusion  430 mg Intravenous Once Elizabethanne Lusher, MD       cetirizine   (QUZYTTIR ) injection 10 mg  10 mg Intravenous Once Alpheus Stiff, MD       dexamethasone  (DECADRON ) injection 10 mg  10 mg Intravenous Once Meara Wiechman, MD       dostarlimab -gxly (JEMPERLI ) 500 mg in sodium chloride  0.9 % 100 mL (4.5455 mg/mL) chemo infusion  500 mg Intravenous Once Taylinn Brabant, MD       famotidine  (PEPCID ) IVPB 20 mg premix  20 mg Intravenous Once Jermy Couper, MD       fosaprepitant  (EMEND) 150 mg in sodium chloride  0.9 % 145 mL IVPB  150 mg Intravenous Once Rosealynn Mateus, MD       heparin  lock flush 100 unit/mL  500 Units Intracatheter  Once PRN Lonn Hicks, MD       PACLitaxel  (TAXOL ) 198 mg in sodium chloride  0.9 % 250 mL chemo infusion (> 80mg /m2)  105 mg/m2 (Treatment Plan Recorded) Intravenous Once Lonn, Dorthia Tout, MD       palonosetron  (ALOXI ) injection 0.25 mg  0.25 mg Intravenous Once Delayla Hoffmaster, MD       sodium chloride  flush (NS) 0.9 % injection 10 mL  10 mL Intracatheter PRN Lonn Hicks, MD        SUMMARY OF ONCOLOGIC HISTORY: Oncology History Overview Note  Outside path showed carcinosarcoma, PD-L1 positive, MSI stable   Uterine cancer (HCC)  07/30/2021 Initial Diagnosis   She was evaluated for post-menopausal bleeding. She reported having been seen a year previously and having a biopsy that was negative.  On exam in clinic, she was noted to have a mass present within the vagina.  This was thought to be a prolapsing fibroid and plan was made for exam under anesthesia, removal prolapsing fibroid with hysteroscopy and D&C   08/11/2021 Pathology Results   PD-L1 (22C3):                                             Does meet FDA-approval, Combined Positive Score (CPS): >=1   Pan-TRK IHC:                                              Tumor cells are Focally Positive for TRK protein expression.   MICROSATELLITE INSTABILITY:             MS-Stable   TUMOR MUTATION BURDEN:                10.8 Muts/Mb TMB-Low   OTHER BIOMARKERS:                              MET  amplification PIK3CA amplification MDM2 amplification CCNE1 amplification   Pertinent Negative Biomarkers Evaluated by NGS: ALK ATM BARD1 BRAF BRCA1 BRCA2 CDK12 CHECK1 CHEK2 EGFR  ERBB2 ESR1 FANCL FGFR2 FGFR3 IDH1 IDH2 KIT KRAS NRAS NTRK1 NTRK2 NTRK3 PALB2 PDGFRA RAD51B RAD51C  RAD51D RAD54L RET ROS1   Comment:  The above studies are performed and reported by [facility] [place], [place]) as part of the [employer] (NGS) panel.  Pertinent results are summarized above.  Please see their separate reports for more detailed information  Addendum electronically signed by Elspeth JAYSON Crisp, MD on 09/03/2021 at  7:37 AM  Final Diagnosis    A.  CERVICAL MASS, EXCISION:       - CONSISTENT WITH CARCINOSARCOMA (HIGH GRADE MALIGNANT BIPHASIC TUMOR)  Electronically signed by Elspeth JAYSON Crisp, MD on 08/15/2021 at  9:53 AM  Comment    The malignant epithelial component exhibits high-grade cytomorphology and immunostaining pattern consistent with serous carcinoma to include strong positive diffuse reactivity for p53, p16, and vimentin, focal subset CK7 and CK AE1/AE3 expression, with high Ki-67 staining (approximately 60%).  Neuroendocrine differentiation is present (CD56 negative but synaptophysin positive).  The stromal component is composed of malignant mitotically active cells and heterologous elements with focal chondroid differentiation (S100 positive).  SMA appears to decorate rare tumor cells and highlights vascular structures.  The tumor cells are  non-reactive for CK20, WT1, GATA3, p40 and desmin.  Block A5 is referred to [facility] ([place], [place]) to perform molecular characterization.  These findings are shared with Dr. Redell Mace on [id number] at [phone] hours by telephone conversation. This case received prospective intradepartmental quality assurance review.   Clinical Information    Cervical mass  Gross Description    A. Cervix Received fresh labeled with the patient's name and date of birth and cervical  mass frozen section per container is an aggregate of red-tan friable soft tissue measuring approximately 5 x 5 x 3 cm in toto.  Representative sections are frozen for an intraoperative consultation.  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   Results rendered by Dr. Vergia and reported to Dr. Mace at [facility] on 08/11/2021 at 8:06 AM.  Microscopic Description    Microscopic examination supports the above diagnosis.  Single antibody immunostains are performed to include: S100, p40, p16, p53, PAX8, CK7, CK20, vimentin, CK AE1/3, GATA3, WT1, desmin, SMA, synaptophysin, CD56, and Ki-67.  All controls are appropriately reactive. [id number], [id number], [id number], [id number], [id number], [id number] (15)          08/22/2021 Initial Diagnosis   Carcinosarcoma (HCC)   09/01/2021 Imaging   CT abdomen and pelvis There is marked enlargement of the uterus measuring 13.7 x 10 x 8 x 10.1 cm. There is inhomogeneous enhancement in the uterus consistent with malignant neoplasm.   There are enlarged lymph nodes in the retroperitoneum in the para-and paracaval regions measuring up to 12 mm in short axis. There are a few enlarged lymph nodes adjacent to the iliac vessels largest measuring 2.6 x 1.4 cm adjacent to the right external iliac vessels. Findings suggest possible metastatic lymphadenopathy. There is mild prominence of the pelvocaliceal system in the left kidney and proximal left ureter. Possibility of extrinsic compression or infiltrative process related to the uterine neoplasm in the distal course of left ureter causing mild obstruction is not excluded.   Gallbladder stone.  Diverticulosis of colon.   Other findings as described in the body of the report.   09/01/2021 Imaging   US  pelvis Large  central uterine ill-defined mass approximately 7.7 cm diameter enlarging uterus likely representing the biopsy-proven neoplasm, likely of endometrial origin.   Questionable intramural leiomyoma posterior mid uterus 3.1 cm diameter.   The mass extend and the adnexal regions are poorly characterized but may potentially be better evaluated by MR imaging with and without contrast.   09/10/2021 Surgery   Pre-operative Diagnosis: carcinosarcoma of the uterus, adenopathy on imaging   Post-operative Diagnosis: same, stage IVB carcinosarcoma of the uterus   Operation: Diagnostic laparoscopy    Surgeon: Viktoria Crank MD Operative Findings: On EUA, cervix 2 cm dilated with tumor noted within the os. Cervix otherwise normal appearing. Uterus enlarged, 12 cm, with limited mobility secondary to weight. Compression of the rectum from uterus although no direct invasion appreciated. On intra-abdominal inspection, adhesions noted between the liver and the anterior abdominal wall. Evidence of carcinomatosis involving liver surface, diaphragm, omentum, anterior abdominal wall. Surface of the uterus with obvious tumor infiltration. Small bowel free from uterus. Posteriorly, sigmoid densely adherent to the posterior uterus. Even with manipulator, unable to move the uterus anteriorly. Minimal ascites.    Given findings of stage IV disease and what would  require large bowel resection and end ostomy, decision made to abort surgery in favor of neoadjuvant chemotherapy. I had discussed this as a distinct possibility when we had gotten her CT results as well as this morning prior to the surgery.    09/15/2021 Cancer Staging   Staging form: Corpus Uteri - Carcinoma and Carcinosarcoma, AJCC 8th Edition - Clinical stage from 09/15/2021: FIGO Stage IVB (cT3, cN2a, pM1) - Signed by Lonn Hicks, MD on 09/15/2021 Stage prefix: Initial diagnosis   09/24/2021 - 03/12/2022 Chemotherapy   Patient is on Treatment Plan : UTERINE  ENDOMETRIAL Dostarlimab -gxly (500 mg) + Carboplatin  (AUC 5) + Paclitaxel  (175 mg/m2) q21d x 6 cycles / Dostarlimab -gxly (1000 mg) q42d x 6 cycles      10/17/2021 Procedure   Status post revision of nonfunctional right IJ port catheter, with new right IJ port catheter placed. Port is ready for use.     11/24/2021 Imaging   1. Today's study demonstrates a positive response to therapy with partial involution of large malignant appearing uterine mass. 2. Multiple prominent borderline enlarged and mildly enlarged retroperitoneal and pelvic lymph nodes are stable to minimally decreased in size compared to the prior study, potentially metastatic. No new lymphadenopathy is noted elsewhere in the chest, abdomen or pelvis. 3. Multiple small pulmonary nodules measuring 5 mm or less in the lungs. These were incidentally imaged on the prior CT of the abdomen and pelvis 09/01/2021, and at this time are stable. The possibility of metastatic disease is not excluded, and close attention on follow-up studies is recommended. 4. Colonic diverticulosis without evidence of acute diverticulitis at this time. 5. Additional incidental findings, as above.     12/31/2021 Surgery   Robotic-assisted laparoscopic total hysterectomy with bilateral salpingo-oophorectomy, lysis of adhesions for approximately 40 minutes, cystoscopy   Findings: On EUA, moderately mobile enlarged uterus.  On intra-abdominal entry, filmy adhesions between the anterior liver and the diaphragm bilaterally.  No carcinomatosis noted on the diaphragm or liver.  Normal-appearing stomach, small bowel and omentum.  No peritoneal lesions in the lower abdominal cavity or pelvis.  No ascites.  Uterus enlarged measuring approximately 12-14 cm, bulbous at the top.  Posterior 2 cm fibroid near the fundus.  Posterior aspect of the uterus densely adherent to the colon mesentery.  Somewhat dilated fluid-filled left fallopian tube.  Normal-appearing right adnexa.  Left  ovary adherent to the broad ligament and sigmoid mesentery but otherwise normal-appearing.  Significant fibrosis bilaterally of the retroperitoneum.  No obvious adenopathy appreciated. On cystoscopy, bladder dome intact, good efflux noted from bilateral ureteral orifices.   12/31/2021 Pathology Results   A. CULDESAC, POSTERIOR ADHESIONS, EXCISION: -  Serosa and subserosal fibrous soft tissue with focal acute inflammation, reactive mesothelial hyperplasia and adhesions. -  Negative for malignancy.  B. UTERUS, CERVIX, BILATERAL TUBE AND OVAARIES, HYSTERECTOMY: - -  Carcinosarcoma (malignant mixed Mullerian tumor) with full-thickness posterior myometrial involvement with focal serosal present and extension into lower uterine segment and extensively in the cervical stroma. 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 Not currently on therapy. * Tracking Code: BO * EXAM: CT CHEST, ABDOMEN, AND PELVIS WITH CONTRAST TECHNIQUE: Multidetector CT imaging of the chest, abdomen and pelvis was performed following the standard protocol during bolus administration of intravenous contrast. RADIATION DOSE REDUCTION: This exam was performed according to the departmental dose-optimization program which includes automated exposure control, adjustment of the mA and/or kV according to patient size and/or use of iterative reconstruction technique. CONTRAST:  OMNIPAQUE  IOHEXOL  300 MG/ML  SOLN COMPARISON:  Multiple priors including CT April 03, 2022 and November 21, 2021 FINDINGS: CT CHEST FINDINGS Cardiovascular: Accessed right chest Port-A-Cath with tip at the superior cavoatrial junction. Normal caliber thoracic aorta. No central pulmonary embolus on this nondedicated study. Normal size heart. No significant pericardial effusion/thickening. Mediastinum/Nodes: No suspicious thyroid  nodule. No pathologically enlarged mediastinal, hilar or axillary lymph nodes. Patulous esophagus. Lungs/Pleura: Scattered bilateral pulmonary nodules are stable from prior examination. No new suspicious pulmonary nodules or masses. For reference: -pulmonary nodule in the medial right lower lobe measuring 5 mm on image 74/4 is unchanged. -3 mm right middle lobe pulmonary nodule on image 89/4 is unchanged. Musculoskeletal: No aggressive lytic or blastic lesion of bone. Multilevel degenerative change of the spine. CT ABDOMEN PELVIS FINDINGS Hepatobiliary: No suspicious hepatic lesion. Gallbladder is unremarkable. No biliary ductal dilation. Pancreas: No pancreatic ductal dilation or evidence of acute inflammation. Spleen: No splenomegaly. Adrenals/Urinary Tract: Bilateral adrenal glands appear normal. No hydronephrosis. Kidneys demonstrate symmetric enhancement. Urinary bladder is unremarkable for  degree of distension. Stomach/Bowel: Radiopaque enteric contrast material traverses the splenic flexure. Stomach is unremarkable for degree of distension. No pathologic dilation of small or large bowel. No evidence of acute bowel inflammation. Vascular/Lymphatic: Normal caliber abdominal aorta. The portal, splenic and superior mesenteric veins are patent. New retroperitoneal, iliac side chain and pelvic sidewall adenopathy. For reference: -retrocaval right upper quadrant lymph node measures 10 mm in short axis on image 65/2 -aortocaval lymph node measures 18 mm in short axis on image 77/2 -left common iliac lymph node measures 2.3 cm in short axis on image 83/2 -left obturator lymph node measures 2.1 cm in short axis on image 94/2. Reproductive: Uterus is surgically absent with a new 2.6 x 2.2 cm nodule near the left vaginal cuff on image 98/2. Other: Mild mesenteric stranding similar prior. No discrete peritoneal or omental nodularity. Nonspecific subcutaneous edema. Musculoskeletal: Round sclerotic osseous lesion in the L1 vertebral body is unchanged. No new aggressive lytic or blastic lesion of bone. Multilevel degenerative changes spine. IMPRESSION: 1. New abdominopelvic adenopathy, compatible with recurrent nodal metastatic disease. 2. New 2.6 cm nodule near the left vaginal cuff, concerning for recurrent local disease versus and adjacent nodal metastasis. 3. Stable round sclerotic osseous lesion in the L1 vertebral body. 4. Scattered bilateral pulmonary nodules are stable from prior examination. No new suspicious pulmonary nodules or masses. These results will be called to the ordering clinician or representative by the Radiologist Assistant, and communication documented in the PACS or  [facility] Dashboard. Electronically Signed   By: Reyes Holder M.D.   On: 10/22/2022 11:37      11/09/2022 -  Chemotherapy   Patient is on Treatment Plan : UTERINE ENDOMETRIAL Dostarlimab -gxly (500 mg) + Carboplatin  (AUC 5) +  Paclitaxel  (175 mg/m2) q21d x 6 cycles / Dostarlimab -gxly (1000 mg) q42d x 6 cycles      01/04/2023 Imaging   CT CHEST ABDOMEN PELVIS W CONTRAST  Result Date: 01/04/2023 CLINICAL DATA:  High-grade tumor assessment endometrial cancer. Assess response to the chemotherapy. * Tracking Code: BO * EXAM: CT CHEST, ABDOMEN, AND PELVIS WITH CONTRAST TECHNIQUE: Multidetector CT imaging of the chest, abdomen and pelvis was performed following the standard protocol during bolus administration of intravenous contrast. RADIATION DOSE REDUCTION: This exam was performed according to the departmental dose-optimization program which includes automated exposure control, adjustment of the mA and/or kV according to patient size and/or use of iterative reconstruction technique. CONTRAST:  OMNIPAQUE  IOHEXOL  300 MG/ML  SOLN COMPARISON:  CT 10/22/2022. FINDINGS: CT CHEST FINDINGS Cardiovascular: Right upper chest port is accessed. Tip seen as far as the central SVC. Heart is nonenlarged. No pericardial effusion. Mild atherosclerotic changes along the aorta. Separate origin of the left vertebral artery directly from the aortic arch, normal variant. Mediastinum/Nodes: Preserved thyroid  gland. Normal caliber thoracic esophagus. No specific abnormal lymph node enlargement identified in the axillary regions, hilum or mediastinum. Small nodes identified including pre cardiac are stable and not pathologic by size criteria. Lungs/Pleura: No consolidation, pneumothorax or effusion. Stable tiny nodules identified. These include 5 mm nodule left upper lobe series 6, image 40, 3 mm nodule middle lobe series 6, image 97, medial right lower lobe nodule series 6, image 80 measuring 5 mm. No new lung nodularity clearly identified. There is also some nodular which are calcified such as left lower lobe superior segment series 6, image 61. Dependent areas of atelectasis and scarring. Musculoskeletal: Curvature of the spine with some degenerative  changes. CT ABDOMEN PELVIS FINDINGS Hepatobiliary: Stones in the gallbladder. Gallbladder is nondilated. Patent portal vein. No space-occupying liver lesion. Pancreas: Unremarkable. No pancreatic ductal dilatation or surrounding inflammatory changes. Spleen: Normal in size without focal abnormality. Adrenals/Urinary Tract: Adrenal glands are preserved. No enhancing renal mass or collecting system dilatation. The ureters have normal course and caliber extending down to the bladder. Slight wall thickening of the urinary bladder, new from previous. There is increasing adjacent stranding as well in the pelvis including extending pelvic sidewall and presacral spaces. Stomach/Bowel: Oral contrast was administered. The stomach is mildly distended with a air and debris. Small bowel is nondilated. Large bowel has a normal course and caliber with moderate stool. Vascular/Lymphatic: Normal caliber aorta and IVC with scattered vascular calcifications. Several abnormal lymph nodes are once again identified along the retroperitoneum and pelvis. Specific lesions will follow up for continuity. These include left common iliac chain node which has a short axis previously of 2.3 cm and today 1.8 cm on series 2, image 85. Retrocaval node which previously had a short axis of 10 mm, today on series 2, image 67 11 mm. Aortocaval node short axis of 18 mm and today when measured in a similar fashion 14 mm on series 2, image 79. Left obturator node short axis of 21 mm on the prior and today series 2, image 96 of 12 mm. No new nodal enlargement. Increasing retroperitoneal stranding. Reproductive: Absence of the uterus. Soft tissue node in the surgical bed which previously measured 2.6 x 2.2 cm, today  is smaller on series 2, image 99 at 2.1 by 1.0 cm. Other: Small umbilical fat containing hernia. Musculoskeletal: Curvature and degenerative changes along the spine. IMPRESSION: Multifocal abnormal lymph node enlargement in the abdomen and  pelvis. Many of these smaller. There is increasing adjacent stranding. Decreasing soft tissue nodule in the surgical bed in the low pelvis. No developing new mass lesion, fluid collection or lymph node enlargement. Stable small lung nodules. Electronically Signed   By: Ranell Bring M.D.   On: 01/04/2023 11:43        PHYSICAL EXAMINATION: ECOG PERFORMANCE STATUS: 2 - Symptomatic, <50% confined to bed  Vitals:   02/22/23 0902  BP: 125/68  Pulse: 71  Resp: 17  Temp: 98 F (36.7 C)  SpO2: 100%   Filed Weights   02/22/23 0902  Weight: 191 lb (86.6 kg)    GENERAL:alert, no distress and comfortable HEART: She has stable bilateral lower extremity edema NEURO: alert & oriented x 3 with fluent speech, no focal motor/sensory deficits  LABORATORY DATA:  I have reviewed the data as listed    Component Value Date/Time   NA 137 02/22/2023 0833   K 4.1 02/22/2023 0833   CL 102 02/22/2023 0833   CO2 25 02/22/2023 0833   GLUCOSE 126 (H) 02/22/2023 0833   BUN 13 02/22/2023 0833   CREATININE 0.72 02/22/2023 0833   CALCIUM  9.5 02/22/2023 0833   PROT 7.4 02/22/2023 0833   ALBUMIN  3.7 02/22/2023 0833   AST 21 02/22/2023 0833   ALT 9 02/22/2023 0833   ALKPHOS 48 02/22/2023 0833   BILITOT 0.5 02/22/2023 0833   GFRNONAA >60 02/22/2023 0833    No results found for: SPEP, UPEP  Lab Results  Component Value Date   WBC 4.5 02/22/2023   NEUTROABS 3.3 02/22/2023   HGB 9.2 (L) 02/22/2023   HCT 29.2 (L) 02/22/2023   MCV 90.4 02/22/2023   PLT 211 02/22/2023      Chemistry      Component Value Date/Time   NA 137 02/22/2023 0833   K 4.1 02/22/2023 0833   CL 102 02/22/2023 0833   CO2 25 02/22/2023 0833   BUN 13 02/22/2023 0833   CREATININE 0.72 02/22/2023 0833      Component Value Date/Time   CALCIUM  9.5 02/22/2023 0833   ALKPHOS 48 02/22/2023 0833   AST 21 02/22/2023 0833   ALT 9 02/22/2023 0833   BILITOT 0.5 02/22/2023 9166

## 2023-02-23 ENCOUNTER — Other Ambulatory Visit: Payer: Self-pay

## 2023-02-23 LAB — T4: T4, Total: 13.1 ug/dL — ABNORMAL HIGH (ref 4.5–12.0)

## 2023-03-07 ENCOUNTER — Other Ambulatory Visit: Payer: Self-pay

## 2023-03-14 ENCOUNTER — Other Ambulatory Visit: Payer: Self-pay | Admitting: Hematology and Oncology

## 2023-03-15 ENCOUNTER — Inpatient Hospital Stay: Payer: Medicare HMO

## 2023-03-15 ENCOUNTER — Encounter: Payer: Self-pay | Admitting: Hematology and Oncology

## 2023-03-15 ENCOUNTER — Inpatient Hospital Stay: Payer: Medicare HMO | Attending: Hematology and Oncology | Admitting: Hematology and Oncology

## 2023-03-15 VITALS — BP 104/72 | HR 76 | Temp 97.5°F | Resp 18 | Ht 66.0 in | Wt 174.0 lb

## 2023-03-15 VITALS — BP 112/74 | HR 68 | Resp 17

## 2023-03-15 DIAGNOSIS — Z79899 Other long term (current) drug therapy: Secondary | ICD-10-CM | POA: Insufficient documentation

## 2023-03-15 DIAGNOSIS — C549 Malignant neoplasm of corpus uteri, unspecified: Secondary | ICD-10-CM | POA: Diagnosis not present

## 2023-03-15 DIAGNOSIS — Z5112 Encounter for antineoplastic immunotherapy: Secondary | ICD-10-CM | POA: Diagnosis present

## 2023-03-15 DIAGNOSIS — R300 Dysuria: Secondary | ICD-10-CM | POA: Diagnosis not present

## 2023-03-15 DIAGNOSIS — D638 Anemia in other chronic diseases classified elsewhere: Secondary | ICD-10-CM

## 2023-03-15 DIAGNOSIS — C541 Malignant neoplasm of endometrium: Secondary | ICD-10-CM | POA: Insufficient documentation

## 2023-03-15 DIAGNOSIS — R600 Localized edema: Secondary | ICD-10-CM

## 2023-03-15 DIAGNOSIS — Z5111 Encounter for antineoplastic chemotherapy: Secondary | ICD-10-CM | POA: Diagnosis present

## 2023-03-15 LAB — URINALYSIS, COMPLETE (UACMP) WITH MICROSCOPIC
Bilirubin Urine: NEGATIVE
Glucose, UA: NEGATIVE mg/dL
Hgb urine dipstick: NEGATIVE
Ketones, ur: NEGATIVE mg/dL
Nitrite: NEGATIVE
Protein, ur: NEGATIVE mg/dL
Specific Gravity, Urine: 1.015 (ref 1.005–1.030)
WBC, UA: >50 WBC/hpf (ref 0–5)
pH: 6 (ref 5.0–8.0)

## 2023-03-15 LAB — CMP (CANCER CENTER ONLY)
ALT: 7 U/L (ref 0–44)
AST: 19 U/L (ref 15–41)
Albumin: 3.7 g/dL (ref 3.5–5.0)
Alkaline Phosphatase: 44 U/L (ref 38–126)
Anion gap: 10 (ref 5–15)
BUN: 15 mg/dL (ref 8–23)
CO2: 27 mmol/L (ref 22–32)
Calcium: 9.1 mg/dL (ref 8.9–10.3)
Chloride: 102 mmol/L (ref 98–111)
Creatinine: 0.83 mg/dL (ref 0.44–1.00)
GFR, Estimated: >60 mL/min (ref >60)
Glucose, Bld: 137 mg/dL — ABNORMAL HIGH (ref 70–99)
Potassium: 3.6 mmol/L (ref 3.5–5.1)
Sodium: 139 mmol/L (ref 135–145)
Total Bilirubin: 0.5 mg/dL (ref 0.0–1.2)
Total Protein: 7.5 g/dL (ref 6.5–8.1)

## 2023-03-15 LAB — CBC WITH DIFFERENTIAL (CANCER CENTER ONLY)
Abs Immature Granulocytes: 0.02 10*3/uL (ref 0.00–0.07)
Basophils Absolute: 0 10*3/uL (ref 0.0–0.1)
Basophils Relative: 0 %
Eosinophils Absolute: 0 10*3/uL (ref 0.0–0.5)
Eosinophils Relative: 0 %
HCT: 28.2 % — ABNORMAL LOW (ref 36.0–46.0)
Hemoglobin: 8.9 g/dL — ABNORMAL LOW (ref 12.0–15.0)
Immature Granulocytes: 0 %
Lymphocytes Relative: 19 %
Lymphs Abs: 1 10*3/uL (ref 0.7–4.0)
MCH: 28.9 pg (ref 26.0–34.0)
MCHC: 31.6 g/dL (ref 30.0–36.0)
MCV: 91.6 fL (ref 80.0–100.0)
Monocytes Absolute: 0.1 10*3/uL (ref 0.1–1.0)
Monocytes Relative: 2 %
Neutro Abs: 4.3 10*3/uL (ref 1.7–7.7)
Neutrophils Relative %: 79 %
Platelet Count: 265 10*3/uL (ref 150–400)
RBC: 3.08 MIL/uL — ABNORMAL LOW (ref 3.87–5.11)
RDW: 18.3 % — ABNORMAL HIGH (ref 11.5–15.5)
WBC Count: 5.4 10*3/uL (ref 4.0–10.5)
nRBC: 0 % (ref 0.0–0.2)

## 2023-03-15 LAB — TSH: TSH: 0.512 u[IU]/mL (ref 0.350–4.500)

## 2023-03-15 MED ORDER — SODIUM CHLORIDE 0.9% FLUSH
10.0000 mL | Freq: Once | INTRAVENOUS | Status: AC
  Administered 2023-03-15: 10 mL

## 2023-03-15 MED ORDER — PALONOSETRON HCL INJECTION 0.25 MG/5ML
0.2500 mg | Freq: Once | INTRAVENOUS | Status: AC
  Administered 2023-03-15: 0.25 mg via INTRAVENOUS
  Filled 2023-03-15: qty 5

## 2023-03-15 MED ORDER — SODIUM CHLORIDE 0.9 % IV SOLN: Freq: Once | INTRAVENOUS | Status: AC

## 2023-03-15 MED ORDER — SODIUM CHLORIDE 0.9 % IV SOLN
428.5000 mg | Freq: Once | INTRAVENOUS | Status: AC
  Administered 2023-03-15: 430 mg via INTRAVENOUS
  Filled 2023-03-15: qty 43

## 2023-03-15 MED ORDER — PACLITAXEL CHEMO INJECTION 300 MG/50ML
105.0000 mg/m2 | Freq: Once | INTRAVENOUS | Status: AC
  Administered 2023-03-15: 198 mg via INTRAVENOUS
  Filled 2023-03-15: qty 33

## 2023-03-15 MED ORDER — SODIUM CHLORIDE 0.9 % IV SOLN
150.0000 mg | Freq: Once | INTRAVENOUS | Status: AC
  Administered 2023-03-15: 150 mg via INTRAVENOUS
  Filled 2023-03-15: qty 150

## 2023-03-15 MED ORDER — SODIUM CHLORIDE 0.9 % IV SOLN
500.0000 mg | Freq: Once | INTRAVENOUS | Status: AC
  Administered 2023-03-15: 500 mg via INTRAVENOUS
  Filled 2023-03-15: qty 10

## 2023-03-15 MED ORDER — HEPARIN SOD (PORK) LOCK FLUSH 100 UNIT/ML IV SOLN
500.0000 [IU] | Freq: Once | INTRAVENOUS | Status: AC | PRN
  Administered 2023-03-15: 500 [IU]

## 2023-03-15 MED ORDER — DEXAMETHASONE SODIUM PHOSPHATE 10 MG/ML IJ SOLN
10.0000 mg | Freq: Once | INTRAMUSCULAR | Status: AC
  Administered 2023-03-15: 10 mg via INTRAVENOUS
  Filled 2023-03-15: qty 1

## 2023-03-15 MED ORDER — FAMOTIDINE IN NACL 20-0.9 MG/50ML-% IV SOLN
20.0000 mg | Freq: Once | INTRAVENOUS | Status: AC
  Administered 2023-03-15: 20 mg via INTRAVENOUS
  Filled 2023-03-15: qty 50

## 2023-03-15 MED ORDER — SODIUM CHLORIDE 0.9% FLUSH
10.0000 mL | INTRAVENOUS | Status: DC | PRN
  Administered 2023-03-15: 10 mL

## 2023-03-15 MED ORDER — CETIRIZINE HCL 10 MG/ML IV SOLN
10.0000 mg | Freq: Once | INTRAVENOUS | Status: AC
Start: 2023-03-15 — End: 2023-03-15
  Administered 2023-03-15: 10 mg via INTRAVENOUS
  Filled 2023-03-15: qty 1

## 2023-03-15 NOTE — Assessment & Plan Note (Signed)
This is stable Her daughter will continue to give her furosemide as needed to keep her weight around 170 pounds I refilled her prescription today

## 2023-03-15 NOTE — Assessment & Plan Note (Signed)
She has frequent urination and occasional incontinence I will order urinalysis and urine culture

## 2023-03-15 NOTE — Progress Notes (Signed)
[facility] OFFICE PROGRESS NOTE  Patient Care Team: Sable Feil, DO as PCP - General (Family Medicine)  ASSESSMENT & PLAN:  Uterine cancer [facility]) Her last CT imaging in November  showed positive response to therapy She appears to tolerate treatment well She is scheduled for CT imaging this month If she have great response to therapy, we will discontinue chemotherapy and she will maintain on immunotherapy only  Anemia, chronic disease This is likely anemia of chronic disease. The patient denies recent history of bleeding such as epistaxis, hematuria or hematochezia. She is asymptomatic from the anemia. We will observe for now.  She does not require transfusion now. I do not recommend any further work-up at this time.    Bilateral edema of lower extremity This is stable Her daughter will continue to give her furosemide as needed to keep her weight around 170 pounds I refilled her prescription today  Dysuria She has frequent urination and occasional incontinence I will order urinalysis and urine culture  Orders Placed This Encounter  Procedures   Urine Culture    Standing Status:   Future    Number of Occurrences:   1    Expected Date:   03/15/2023    Expiration Date:   03/14/2024   Urinalysis, Complete w Microscopic    Standing Status:   Future    Number of Occurrences:   1    Expected Date:   03/15/2023    Expiration Date:   03/14/2024    All questions were answered. The patient knows to call the clinic with any problems, questions or concerns. The total time spent in the appointment was 30 minutes encounter with patients including review of chart and various tests results, discussions about plan of care and coordination of care plan   Artis Delay, MD 03/15/2023 9:44 AM  INTERVAL HISTORY: Please see below for problem oriented charting. she returns for chemo follow-up She tolerated treatment well Denies progression of neuropathy No nausea or changes in bowel  habits Denies recent bleeding She had frequent urination and occasional urinary incontinence  REVIEW OF SYSTEMS:   Constitutional: Denies fevers, chills or abnormal weight loss Eyes: Denies blurriness of vision Ears, nose, mouth, throat, and face: Denies mucositis or sore throat Respiratory: Denies cough, dyspnea or wheezes Cardiovascular: Denies palpitation, chest discomfort or lower extremity swelling Gastrointestinal:  Denies nausea, heartburn or change in bowel habits Skin: Denies abnormal skin rashes Lymphatics: Denies new lymphadenopathy or easy bruising Neurological:Denies numbness, tingling or new weaknesses Behavioral/Psych: Mood is stable, no new changes  All other systems were reviewed with the patient and are negative.  I have reviewed the past medical history, past surgical history, social history and family history with the patient and they are unchanged from previous note.  ALLERGIES:  is allergic to hydrocodone-acetaminophen and oxycodone-acetaminophen.  MEDICATIONS:  Current Outpatient Medications  Medication Sig Dispense Refill   estradiol (ESTRACE VAGINAL) 0.1 MG/GM vaginal cream Place 1 Applicatorful vaginally at bedtime. 42.5 g 12   allopurinol (ZYLOPRIM) 300 MG tablet Take 300 mg by mouth daily.     amiodarone (PACERONE) 200 MG tablet Take 1 tablet (200 mg total) by mouth daily. 30 tablet 3   apixaban (ELIQUIS) 5 MG TABS tablet Take 1 tablet (5 mg total) by mouth 2 (two) times daily. 14 tablet 0   ascorbic acid (VITAMIN C) 250 MG tablet Take 250 mg by mouth daily.     Biotin (BIOTIN 5000) 5 MG CAPS Take 5 mg by  mouth daily.     dexamethasone (DECADRON) 4 MG tablet Take 2 tabs at the night before and 2 tab the morning of chemotherapy, every 3 weeks, by mouth x 6 cycles 24 tablet 6   furosemide (LASIX) 20 MG tablet TAKE 1 TABLET(20 MG) BY MOUTH DAILY 30 tablet 3   lidocaine-prilocaine (EMLA) cream Apply to affected area once 30 g 3   metoprolol succinate  (TOPROL-XL) 25 MG 24 hr tablet Take 25 mg by mouth daily.     Multiple Vitamins-Minerals (MULTIVITAMIN WITH MINERALS) tablet Take 1 tablet by mouth at bedtime. Centrum     ondansetron (ZOFRAN) 8 MG tablet Take 1 tablet (8 mg total) by mouth every 8 (eight) hours as needed for nausea or vomiting. Start on the third day after chemotherapy. 30 tablet 1   polyethylene glycol (MIRALAX / GLYCOLAX) 17 g packet Take 17 g by mouth daily.     potassium chloride (KLOR-CON) 10 MEQ tablet Take 10 mEq by mouth daily.     prochlorperazine (COMPAZINE) 10 MG tablet Take 1 tablet (10 mg total) by mouth every 6 (six) hours as needed for nausea or vomiting. 30 tablet 1   simvastatin (ZOCOR) 10 MG tablet Take 10 mg by mouth daily at 6 PM.     No current facility-administered medications for this visit.    SUMMARY OF ONCOLOGIC HISTORY: Oncology History Overview Note  Outside path showed carcinosarcoma, PD-L1 positive, MSI stable   Uterine cancer (HCC)  07/30/2021 Initial Diagnosis   She was evaluated for post-menopausal bleeding. She reported having been seen a year previously and having a biopsy that was negative.  On exam in clinic, she was noted to have a mass present within the vagina.  This was thought to be a prolapsing fibroid and plan was made for exam under anesthesia, removal prolapsing fibroid with hysteroscopy and D&C   08/11/2021 Pathology Results   PD-L1 [phone]):                                             Does meet FDA-approval, Combined Positive Score (CPS): >=1   Pan-TRK IHC:                                              Tumor cells are Focally Positive for TRK protein expression.   MICROSATELLITE INSTABILITY:             MS-Stable   TUMOR MUTATION BURDEN:                10.8 Muts/Mb TMB-Low   OTHER BIOMARKERS:                              MET amplification PIK3CA amplification MDM2 amplification CCNE1 amplification   Pertinent Negative Biomarkers Evaluated by NGS: ALK ATM BARD1 BRAF BRCA1  BRCA2 CDK12 CHECK1 CHEK2 EGFR  ERBB2 ESR1 FANCL FGFR2 FGFR3 IDH1 IDH2 KIT KRAS NRAS NTRK1 NTRK2 NTRK3 PALB2 PDGFRA RAD51B RAD51C  RAD51D RAD54L RET ROS1   Comment:  The above studies are performed and reported by [facility], [place]) as part of the [employer] (NGS) panel.  Pertinent results are summarized above.  Please see their separate reports for more detailed information  Addendum electronically signed by Dalbert Mayotte, MD on 09/03/2021 at  7:37 AM  Final Diagnosis    A.  CERVICAL MASS, EXCISION:       - CONSISTENT WITH CARCINOSARCOMA (HIGH GRADE MALIGNANT BIPHASIC TUMOR)  Electronically signed by Dalbert Mayotte, MD on 08/15/2021 at  9:53 AM  Comment    The malignant epithelial component exhibits high-grade cytomorphology and immunostaining pattern consistent with serous carcinoma to include strong positive diffuse reactivity for p53, p16, and vimentin, focal subset CK7 and CK AE1/AE3 expression, with high Ki-67 staining (approximately 60%).  Neuroendocrine differentiation is present (CD56 negative but synaptophysin positive).  The stromal component is composed of malignant mitotically active cells and heterologous elements with focal chondroid differentiation (S100 positive).  SMA appears to decorate rare tumor cells and highlights vascular structures.  The tumor cells are non-reactive for CK20, WT1, GATA3, p40 and desmin.  Block A5 is referred to [facility], [place]) to perform molecular characterization.  These findings are shared with Dr. Jamie Kato on [id number] at [phone] hours by telephone conversation. This case received prospective intradepartmental quality assurance review.   Clinical Information    Cervical mass  Gross Description    A. Cervix Received fresh labeled with the patient's name and date of birth and "cervical mass frozen section" per container is an aggregate of red-tan friable soft tissue measuring approximately 5 x 5 x 3 cm in toto.  Representative sections  are frozen for an intraoperative consultation.  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   Results rendered by Dr. Gasper Lloyd and reported to Dr. Paralee Cancel at [facility] on 08/11/2021 at 8:06 AM.  Microscopic Description    Microscopic examination supports the above diagnosis.  Single antibody immunostains are performed to include: S100, p40, p16, p53, PAX8, CK7, CK20, vimentin, CK AE1/3, GATA3, WT1, desmin, SMA, synaptophysin, CD56, and Ki-67.  All controls are appropriately reactive. [id number], [id number], [id number], [id number], [id number], [id number] (15)          08/22/2021 Initial Diagnosis   Carcinosarcoma (HCC)   09/01/2021 Imaging   CT abdomen and pelvis There is marked enlargement of the uterus measuring 13.7 x 10 x 8 x 10.1 cm. There is inhomogeneous enhancement in the uterus consistent with malignant neoplasm.   There are enlarged lymph nodes in the retroperitoneum in the para-and paracaval regions measuring up to 12 mm in short axis. There are a few enlarged lymph nodes adjacent to the iliac vessels largest measuring 2.6 x 1.4 cm adjacent to the right external iliac vessels. Findings suggest possible metastatic lymphadenopathy. There is mild prominence of the pelvocaliceal system in the left kidney and proximal left ureter. Possibility of extrinsic compression or infiltrative process related to the uterine neoplasm in the distal course of left ureter causing mild obstruction is not excluded.   Gallbladder stone.  Diverticulosis of colon.   Other findings as described in the body of the report.   09/01/2021 Imaging   US pelvis Large central uterine ill-defined mass approximately 7.7 cm diameter enlarging uterus likely representing the biopsy-proven neoplasm, likely of endometrial  origin.   Questionable intramural leiomyoma posterior mid uterus 3.1 cm diameter.   The mass extend and the adnexal regions are poorly characterized but may potentially be better evaluated by MR imaging with and without contrast.  09/10/2021 Surgery   Pre-operative Diagnosis: carcinosarcoma of the uterus, adenopathy on imaging   Post-operative Diagnosis: same, stage IVB carcinosarcoma of the uterus   Operation: Diagnostic laparoscopy    Surgeon: Eugene Garnet MD Operative Findings: On EUA, cervix 2 cm dilated with tumor noted within the os. Cervix otherwise normal appearing. Uterus enlarged, 12 cm, with limited mobility secondary to weight. Compression of the rectum from uterus although no direct invasion appreciated. On intra-abdominal inspection, adhesions noted between the liver and the anterior abdominal wall. Evidence of carcinomatosis involving liver surface, diaphragm, omentum, anterior abdominal wall. Surface of the uterus with obvious tumor infiltration. Small bowel free from uterus. Posteriorly, sigmoid densely adherent to the posterior uterus. Even with manipulator, unable to move the uterus anteriorly. Minimal ascites.    Given findings of stage IV disease and what would require large bowel resection and end ostomy, decision made to abort surgery in favor of neoadjuvant chemotherapy. I had discussed this as a distinct possibility when we had gotten her CT results as well as this morning prior to the surgery.    09/15/2021 Cancer Staging   Staging form: Corpus Uteri - Carcinoma and Carcinosarcoma, AJCC 8th Edition - Clinical stage from 09/15/2021: FIGO Stage IVB (cT3, cN2a, pM1) - Signed by Artis Delay, MD on 09/15/2021 Stage prefix: Initial diagnosis   09/24/2021 - 03/12/2022 Chemotherapy   Patient is on Treatment Plan : UTERINE ENDOMETRIAL Dostarlimab-gxly (500 mg) + Carboplatin (AUC 5) + Paclitaxel (175 mg/m2) q21d x 6 cycles / Dostarlimab-gxly (1000 mg) q42d x 6 cycles       10/17/2021 Procedure   Status post revision of nonfunctional right IJ port catheter, with new right IJ port catheter placed. Port is ready for use.     11/24/2021 Imaging   1. Today's study demonstrates a positive response to therapy with partial involution of large malignant appearing uterine mass. 2. Multiple prominent borderline enlarged and mildly enlarged retroperitoneal and pelvic lymph nodes are stable to minimally decreased in size compared to the prior study, potentially metastatic. No new lymphadenopathy is noted elsewhere in the chest, abdomen or pelvis. 3. Multiple small pulmonary nodules measuring 5 mm or less in the lungs. These were incidentally imaged on the prior CT of the abdomen and pelvis 09/01/2021, and at this time are stable. The possibility of metastatic disease is not excluded, and close attention on follow-up studies is recommended. 4. Colonic diverticulosis without evidence of acute diverticulitis at this time. 5. Additional incidental findings, as above.     12/31/2021 Surgery   Robotic-assisted laparoscopic total hysterectomy with bilateral salpingo-oophorectomy, lysis of adhesions for approximately 40 minutes, cystoscopy   Findings: On EUA, moderately mobile enlarged uterus.  On intra-abdominal entry, filmy adhesions between the anterior liver and the diaphragm bilaterally.  No carcinomatosis noted on the diaphragm or liver.  Normal-appearing stomach, small bowel and omentum.  No peritoneal lesions in the lower abdominal cavity or pelvis.  No ascites.  Uterus enlarged measuring approximately 12-14 cm, bulbous at the top.  Posterior 2 cm fibroid near the fundus.  Posterior aspect of the uterus densely adherent to the colon mesentery.  Somewhat dilated fluid-filled left fallopian tube.  Normal-appearing right adnexa.  Left ovary adherent to the broad ligament and sigmoid mesentery but otherwise normal-appearing.  Significant fibrosis bilaterally of the retroperitoneum.  No  obvious adenopathy appreciated. On cystoscopy, bladder dome intact, good efflux noted from bilateral ureteral orifices.   12/31/2021 Pathology Results   A. CULDESAC, POSTERIOR ADHESIONS, EXCISION: -  Serosa and  subserosal fibrous soft tissue with focal acute inflammation, reactive mesothelial hyperplasia and adhesions. -  Negative for malignancy.  B. UTERUS, CERVIX, BILATERAL TUBE AND OVAARIES, HYSTERECTOMY: - -  Carcinosarcoma (malignant mixed Mullerian tumor) with full-thickness posterior myometrial involvement with focal serosal present and extension into lower uterine segment and extensively in the cervical stroma. 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 Not currently on therapy. * Tracking Code: BO * EXAM: CT CHEST, ABDOMEN, AND PELVIS WITH  CONTRAST TECHNIQUE: Multidetector CT imaging of the chest, abdomen and pelvis was performed following the standard protocol during bolus administration of intravenous contrast. RADIATION DOSE REDUCTION: This exam was performed according to the departmental dose-optimization program which includes automated exposure control, adjustment of the mA and/or kV according to patient size and/or use of iterative reconstruction technique. CONTRAST:  OMNIPAQUE IOHEXOL 300 MG/ML  SOLN COMPARISON:  Multiple priors including CT April 03, 2022 and November 21, 2021 FINDINGS: CT CHEST FINDINGS Cardiovascular: Accessed right chest Port-A-Cath with tip at the superior cavoatrial junction. Normal caliber thoracic aorta. No central pulmonary embolus on this nondedicated study. Normal size heart. No significant pericardial effusion/thickening. Mediastinum/Nodes: No suspicious thyroid nodule. No pathologically enlarged mediastinal, hilar or axillary lymph nodes. Patulous esophagus. Lungs/Pleura: Scattered bilateral pulmonary nodules are stable from prior examination. No new suspicious pulmonary nodules or masses. For reference: -pulmonary nodule in the medial right lower lobe measuring 5 mm on image 74/4 is unchanged. -3 mm right middle lobe pulmonary nodule on image 89/4 is unchanged. Musculoskeletal: No aggressive lytic or blastic lesion of bone. Multilevel degenerative change of the spine. CT ABDOMEN PELVIS FINDINGS Hepatobiliary: No suspicious hepatic lesion. Gallbladder is unremarkable. No biliary ductal dilation. Pancreas: No pancreatic ductal dilation or evidence of acute inflammation. Spleen: No splenomegaly. Adrenals/Urinary Tract: Bilateral adrenal glands appear normal. No hydronephrosis. Kidneys demonstrate symmetric enhancement. Urinary bladder is unremarkable for degree of distension. Stomach/Bowel: Radiopaque enteric contrast material traverses the splenic flexure. Stomach is unremarkable for degree of distension.  No pathologic dilation of small or large bowel. No evidence of acute bowel inflammation. Vascular/Lymphatic: Normal caliber abdominal aorta. The portal, splenic and superior mesenteric veins are patent. New retroperitoneal, iliac side chain and pelvic sidewall adenopathy. For reference: -retrocaval right upper quadrant lymph node measures 10 mm in short axis on image 65/2 -aortocaval lymph node measures 18 mm in short axis on image 77/2 -left common iliac lymph node measures 2.3 cm in short axis on image 83/2 -left obturator lymph node measures 2.1 cm in short axis on image  94/2. Reproductive: Uterus is surgically absent with a new 2.6 x 2.2 cm nodule near the left vaginal cuff on image 98/2. Other: Mild mesenteric stranding similar prior. No discrete peritoneal or omental nodularity. Nonspecific subcutaneous edema. Musculoskeletal: Round sclerotic osseous lesion in the L1 vertebral body is unchanged. No new aggressive lytic or blastic lesion of bone. Multilevel degenerative changes spine. IMPRESSION: 1. New abdominopelvic adenopathy, compatible with recurrent nodal metastatic disease. 2. New 2.6 cm nodule near the left vaginal cuff, concerning for recurrent local disease versus and adjacent nodal metastasis. 3. Stable round sclerotic osseous lesion in the L1 vertebral body. 4. Scattered bilateral pulmonary nodules are stable from prior examination. No new suspicious pulmonary nodules or masses. These results will be called to the ordering clinician or representative by the Radiologist Assistant, and communication documented in the PACS or [employer]. Electronically Signed   By: Maudry Mayhew M.D.   On: 10/22/2022 11:37      11/09/2022 -  Chemotherapy   Patient is on Treatment Plan : UTERINE ENDOMETRIAL Dostarlimab-gxly (500 mg) + Carboplatin (AUC 5) + Paclitaxel (175 mg/m2) q21d x 6 cycles / Dostarlimab-gxly (1000 mg) q42d x 6 cycles      01/04/2023 Imaging   CT CHEST ABDOMEN PELVIS W  CONTRAST  Result Date: 01/04/2023 CLINICAL DATA:  High-grade tumor assessment endometrial cancer. Assess response to the chemotherapy. * Tracking Code: BO * EXAM: CT CHEST, ABDOMEN, AND PELVIS WITH CONTRAST TECHNIQUE: Multidetector CT imaging of the chest, abdomen and pelvis was performed following the standard protocol during bolus administration of intravenous contrast. RADIATION DOSE REDUCTION: This exam was performed according to the departmental dose-optimization program which includes automated exposure control, adjustment of the mA and/or kV according to patient size and/or use of iterative reconstruction technique. CONTRAST:  OMNIPAQUE IOHEXOL 300 MG/ML  SOLN COMPARISON:  CT 10/22/2022. FINDINGS: CT CHEST FINDINGS Cardiovascular: Right upper chest port is accessed. Tip seen as far as the central SVC. Heart is nonenlarged. No pericardial effusion. Mild atherosclerotic changes along the aorta. Separate origin of the left vertebral artery directly from the aortic arch, normal variant. Mediastinum/Nodes: Preserved thyroid gland. Normal caliber thoracic esophagus. No specific abnormal lymph node enlargement identified in the axillary regions, hilum or mediastinum. Small nodes identified including pre cardiac are stable and not pathologic by size criteria. Lungs/Pleura: No consolidation, pneumothorax or effusion. Stable tiny nodules identified. These include 5 mm nodule left upper lobe series 6, image 40, 3 mm nodule middle lobe series 6, image 97, medial right lower lobe nodule series 6, image 80 measuring 5 mm. No new lung nodularity clearly identified. There is also some nodular which are calcified such as left lower lobe superior segment series 6, image 61. Dependent areas of atelectasis and scarring. Musculoskeletal: Curvature of the spine with some degenerative changes. CT ABDOMEN PELVIS FINDINGS Hepatobiliary: Stones in the gallbladder. Gallbladder is nondilated. Patent portal vein. No  space-occupying liver lesion. Pancreas: Unremarkable. No pancreatic ductal dilatation or surrounding inflammatory changes. Spleen: Normal in size without focal abnormality. Adrenals/Urinary Tract: Adrenal glands are preserved. No enhancing renal mass or collecting system dilatation. The ureters have normal course and caliber extending down to the bladder. Slight wall thickening of the urinary bladder, new from previous. There is increasing adjacent stranding as well in the pelvis including extending pelvic sidewall and presacral spaces. Stomach/Bowel: Oral contrast was administered. The stomach is mildly distended with a air and debris. Small bowel is nondilated. Large bowel has a normal course and caliber with moderate stool.  Vascular/Lymphatic: Normal caliber aorta and IVC with scattered vascular calcifications. Several abnormal lymph nodes are once again identified along the retroperitoneum and pelvis. Specific lesions will follow up for continuity. These include left common iliac chain node which has a short axis previously of 2.3 cm and today 1.8 cm on series 2, image 85. Retrocaval node which previously had a short axis of 10 mm, today on series 2, image 67 11 mm. Aortocaval node short axis of 18 mm and today when measured in a similar fashion 14 mm on series 2, image 79. Left obturator node short axis of 21 mm on the prior and today series 2, image 96 of 12 mm. No new nodal enlargement. Increasing retroperitoneal stranding. Reproductive: Absence of the uterus. Soft tissue node in the surgical bed which previously measured 2.6 x 2.2 cm, today is smaller on series 2, image 99 at 2.1 by 1.0 cm. Other: Small umbilical fat containing hernia. Musculoskeletal: Curvature and degenerative changes along the spine. IMPRESSION: Multifocal abnormal lymph node enlargement in the abdomen and pelvis. Many of these smaller. There is increasing adjacent stranding. Decreasing soft tissue nodule in the surgical bed in the low  pelvis. No developing new mass lesion, fluid collection or lymph node enlargement. Stable small lung nodules. Electronically Signed   By: Karen Kays M.D.   On: 01/04/2023 11:43        PHYSICAL EXAMINATION: ECOG PERFORMANCE STATUS: 2 - Symptomatic, <50% confined to bed  Vitals:   03/15/23 0917  BP: 104/72  Pulse: 76  Resp: 18  Temp: (!) 97.5 F (36.4 C)  SpO2: 99%   Filed Weights   03/15/23 0917  Weight: 174 lb (78.9 kg)    GENERAL:alert, no distress and comfortable  LABORATORY DATA:  I have reviewed the data as listed    Component Value Date/Time   NA 137 02/22/2023 0833   K 4.1 02/22/2023 0833   CL 102 02/22/2023 0833   CO2 25 02/22/2023 0833   GLUCOSE 126 (H) 02/22/2023 0833   BUN 13 02/22/2023 0833   CREATININE 0.72 02/22/2023 0833   CALCIUM 9.5 02/22/2023 0833   PROT 7.4 02/22/2023 0833   ALBUMIN 3.7 02/22/2023 0833   AST 21 02/22/2023 0833   ALT 9 02/22/2023 0833   ALKPHOS 48 02/22/2023 0833   BILITOT 0.5 02/22/2023 0833   GFRNONAA >60 02/22/2023 0833    No results found for: "SPEP", "UPEP"  Lab Results  Component Value Date   WBC 5.4 03/15/2023   NEUTROABS 4.3 03/15/2023   HGB 8.9 (L) 03/15/2023   HCT 28.2 (L) 03/15/2023   MCV 91.6 03/15/2023   PLT 265 03/15/2023      Chemistry      Component Value Date/Time   NA 137 02/22/2023 0833   K 4.1 02/22/2023 0833   CL 102 02/22/2023 0833   CO2 25 02/22/2023 0833   BUN 13 02/22/2023 0833   CREATININE 0.72 02/22/2023 0833      Component Value Date/Time   CALCIUM 9.5 02/22/2023 0833   ALKPHOS 48 02/22/2023 0833   AST 21 02/22/2023 0833   ALT 9 02/22/2023 0833   BILITOT 0.5 02/22/2023 1610

## 2023-03-15 NOTE — Patient Instructions (Addendum)
[facility] - A DEPT OF [facility]  Discharge Instructions: Thank you for choosing [facility] to provide your oncology and hematology care.   If you have a lab appointment with the Cancer Center, please go directly to the Cancer Center and check in at the registration area.   Wear comfortable clothing and clothing appropriate for easy access to any Portacath or PICC line.   We strive to give you quality time with your provider. You may need to reschedule your appointment if you arrive late (15 or more minutes).  Arriving late affects you and other patients whose appointments are after yours.  Also, if you miss three or more appointments without notifying the office, you may be dismissed from the clinic at the provider's discretion.      For prescription refill requests, have your pharmacy contact our office and allow 72 hours for refills to be completed.    Today you received the following chemotherapy and/or immunotherapy agents CARBOplatin (PARAPLATIN), PACLitaxel (TAXOL), and dostarlimab-gxly (JEMPERLI)   To help prevent nausea and vomiting after your treatment, we encourage you to take your nausea medication as directed.  BELOW ARE SYMPTOMS THAT SHOULD BE REPORTED IMMEDIATELY: *FEVER GREATER THAN 100.4 F (38 C) OR HIGHER *CHILLS OR SWEATING *NAUSEA AND VOMITING THAT IS NOT CONTROLLED WITH YOUR NAUSEA MEDICATION *UNUSUAL SHORTNESS OF BREATH *UNUSUAL BRUISING OR BLEEDING *URINARY PROBLEMS (pain or burning when urinating, or frequent urination) *BOWEL PROBLEMS (unusual diarrhea, constipation, pain near the anus) TENDERNESS IN MOUTH AND THROAT WITH OR WITHOUT PRESENCE OF ULCERS (sore throat, sores in mouth, or a toothache) UNUSUAL RASH, SWELLING OR PAIN  UNUSUAL VAGINAL DISCHARGE OR ITCHING   Items with * indicate a potential emergency and should be followed up as soon as possible or go to the Emergency Department if any problems should  occur.  Please show the CHEMOTHERAPY ALERT CARD or IMMUNOTHERAPY ALERT CARD at check-in to the Emergency Department and triage nurse.  Should you have questions after your visit or need to cancel or reschedule your appointment, please contact [facility] - A DEPT OF [facility]  Dept: [phone]  and follow the prompts.  Office hours are 8:00 a.m. to 4:30 p.m. Monday - Friday. Please note that voicemails left after 4:00 p.m. may not be returned until the following business day.  We are closed weekends and major holidays. You have access to a nurse at all times for urgent questions. Please call the main number to the clinic Dept: [phone] and follow the prompts.   For any non-urgent questions, you may also contact your provider using MyChart. We now offer e-Visits for anyone 12 and older to request care online for non-urgent symptoms. For details visit [email or web address].   Also download the MyChart app! Go to the app store, search "MyChart", open the app, select [facility], and log in with your MyChart username and password.

## 2023-03-15 NOTE — Assessment & Plan Note (Signed)
Her last CT imaging in November  showed positive response to therapy She appears to tolerate treatment well She is scheduled for CT imaging this month If she have great response to therapy, we will discontinue chemotherapy and she will maintain on immunotherapy only

## 2023-03-15 NOTE — Assessment & Plan Note (Signed)
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 The patient denies recent history of bleeding such as epistaxis, hematuria or hematochezia. She is asymptomatic from the anemia. We will observe for now.  She does not require transfusion now. I do not recommend any further work-up at this time.   

## 2023-03-16 LAB — T4: T4, Total: 13 ug/dL — ABNORMAL HIGH (ref 4.5–12.0)

## 2023-03-17 ENCOUNTER — Other Ambulatory Visit: Payer: Self-pay | Admitting: Hematology and Oncology

## 2023-03-17 ENCOUNTER — Telehealth: Payer: Self-pay

## 2023-03-17 LAB — URINE CULTURE: Culture: >=100000 — AB

## 2023-03-17 MED ORDER — AMPICILLIN 500 MG PO CAPS: 500.0000 mg | ORAL_CAPSULE | Freq: Three times a day (TID) | ORAL | 0 refills | Status: DC

## 2023-03-17 NOTE — Telephone Encounter (Signed)
-----   Message from Almeda Jacobs sent at 03/17/2023  9:19 AM EST ----- Urine culture confirmed UTI I sent antibiotics to her local pharmacy Please call her or daughter and let her know

## 2023-03-17 NOTE — Telephone Encounter (Signed)
Called and s/w pt's daughter, Orlie Pollen per MD. Educated on importance of fluids and nutrition with abx. She verbalized understanding and knows to call with any further concerns.

## 2023-03-18 ENCOUNTER — Encounter: Payer: Self-pay | Admitting: Hematology and Oncology

## 2023-03-29 ENCOUNTER — Encounter (HOSPITAL_COMMUNITY): Payer: Self-pay

## 2023-03-29 ENCOUNTER — Ambulatory Visit (HOSPITAL_COMMUNITY)
Admission: RE | Admit: 2023-03-29 | Discharge: 2023-03-29 | Disposition: A | Discharge status: Home / Self Care | Payer: Medicare HMO | Source: Ambulatory Visit | Attending: Hematology and Oncology | Admitting: Hematology and Oncology

## 2023-03-29 DIAGNOSIS — C549 Malignant neoplasm of corpus uteri, unspecified: Secondary | ICD-10-CM | POA: Insufficient documentation

## 2023-03-29 MED ORDER — HEPARIN SOD (PORK) LOCK FLUSH 100 UNIT/ML IV SOLN
INTRAVENOUS | Status: AC
Start: 2023-03-29 — End: ?
  Filled 2023-03-29: qty 5

## 2023-03-29 MED ORDER — IOHEXOL 300 MG/ML  SOLN
100.0000 mL | Freq: Once | INTRAMUSCULAR | Status: AC | PRN
  Administered 2023-03-29: 100 mL via INTRAVENOUS

## 2023-03-29 MED ORDER — HEPARIN SOD (PORK) LOCK FLUSH 100 UNIT/ML IV SOLN
500.0000 [IU] | Freq: Once | INTRAVENOUS | Status: AC
  Administered 2023-03-29: 500 [IU] via INTRAVENOUS

## 2023-03-30 ENCOUNTER — Telehealth: Payer: Self-pay

## 2023-03-30 ENCOUNTER — Other Ambulatory Visit: Payer: Self-pay | Admitting: Hematology and Oncology

## 2023-03-30 NOTE — Telephone Encounter (Signed)
Returned call to daughter and Gaynelle Adu on 3 way call. They saw the CT scan results and with the suggestion of cancer progression. They want to keep phone visit as scheduled. Dr. Bertis Ruddy canceled treatment on 2/24. They are aware of appt changes.

## 2023-04-02 ENCOUNTER — Encounter: Payer: Self-pay | Admitting: Oncology

## 2023-04-02 ENCOUNTER — Telehealth: Payer: Self-pay

## 2023-04-02 ENCOUNTER — Encounter: Payer: Self-pay | Admitting: Hematology and Oncology

## 2023-04-02 ENCOUNTER — Inpatient Hospital Stay (HOSPITAL_BASED_OUTPATIENT_CLINIC_OR_DEPARTMENT_OTHER): Payer: Medicare HMO | Admitting: Hematology and Oncology

## 2023-04-02 ENCOUNTER — Other Ambulatory Visit: Payer: Self-pay | Admitting: Hematology and Oncology

## 2023-04-02 DIAGNOSIS — C549 Malignant neoplasm of corpus uteri, unspecified: Secondary | ICD-10-CM

## 2023-04-02 DIAGNOSIS — C541 Malignant neoplasm of endometrium: Secondary | ICD-10-CM

## 2023-04-02 DIAGNOSIS — Z5112 Encounter for antineoplastic immunotherapy: Secondary | ICD-10-CM | POA: Diagnosis not present

## 2023-04-02 DIAGNOSIS — Z79899 Other long term (current) drug therapy: Secondary | ICD-10-CM | POA: Diagnosis not present

## 2023-04-02 NOTE — Telephone Encounter (Signed)
Returned daughters call and left a message asking her to call the office back. She is wanting to move echocardiogram appt.

## 2023-04-02 NOTE — Telephone Encounter (Signed)
Returned [employer]. They are having transportation issues and need to move echo appt to 3/31. She wants to cancel all appts and get everything rescheduled to after 3/31. They can only come on Mondays.

## 2023-04-02 NOTE — Telephone Encounter (Signed)
Returned to call to daughter. They have decided to get doxorubicin after reviewing side effects. Told daughter I would send a message to Dr. Bertis Ruddy.

## 2023-04-02 NOTE — Assessment & Plan Note (Signed)
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, paclitaxel and dostarlimab I will order additional molecular tests on her previous surgical sample from 2023 to see if they are actionable mutations The next line/option of chemotherapy that could be effective would be doxorubicin The patient has history of arrhythmia Her most recent echocardiogram showed preserved ejection fraction We discussed risk and benefits of doxorubicin versus combination chemotherapy of cisplatin with gemcitabine or gemcitabine alone The patient is quite sedentary and is at risk of dehydration and renal failure on cisplatin chemotherapy Ultimately, the patient and family is undecided They will call me next week with final decision

## 2023-04-02 NOTE — Progress Notes (Signed)
HEMATOLOGY-ONCOLOGY ELECTRONIC VISIT PROGRESS NOTE  Patient Care Team: [name], Algis Greenhouse, DO as PCP - General (Family Medicine)  I connected with the patient via telephone conference and verified that I am speaking with the correct person using two identifiers. The patient's location is at home and I am providing care from the [facility] I discussed the limitations, risks, security and privacy concerns of performing an evaluation and management service by e-visits and the availability of in person appointments.  I also discussed with the patient that there may be a patient responsible charge related to this service. The patient expressed understanding and agreed to proceed.   ASSESSMENT & PLAN:  Uterine cancer (HCC) 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, paclitaxel and dostarlimab I will order additional molecular tests on her previous surgical sample from 2023 to see if they are actionable mutations The next line/option of chemotherapy that could be effective would be doxorubicin The patient has history of arrhythmia Her most recent echocardiogram showed preserved ejection fraction We discussed risk and benefits of doxorubicin versus combination chemotherapy of cisplatin with gemcitabine or gemcitabine alone The patient is quite sedentary and is at risk of dehydration and renal failure on cisplatin chemotherapy Ultimately, the patient and family is undecided They will call me next week with final decision  No orders of the defined types were placed in this encounter.   INTERVAL HISTORY: Please see below for problem oriented charting. The purpose of today's discussion is to review test results and to discuss treatment options She is not symptomatic except recent treatment for urinary  tract infection We discussed imaging study findings and discussed other options I explained to the patient and family the rationale why surgery and radiation are not available treatment modality for her  SUMMARY OF ONCOLOGIC HISTORY: Oncology History Overview Note  Outside path showed carcinosarcoma, PD-L1 positive, MSI stable Progressed on carboplatin, taxol and Dostarlimab   Uterine cancer (HCC)  07/30/2021 Initial Diagnosis   She was evaluated for post-menopausal bleeding. She reported having been seen a year previously and having a biopsy that was negative.  On exam in clinic, she was noted to have a mass present within the vagina.  This was thought to be a prolapsing fibroid and plan was made for exam under anesthesia, removal prolapsing fibroid with hysteroscopy and D&C   08/11/2021 Pathology Results   PD-L1 [phone]):                                             Does meet FDA-approval, Combined Positive Score (CPS): >=1   Pan-TRK IHC:                                              Tumor cells are Focally Positive for TRK protein expression.   MICROSATELLITE INSTABILITY:             MS-Stable   TUMOR MUTATION BURDEN:                10.8 Muts/Mb TMB-Low   OTHER BIOMARKERS:  MET amplification PIK3CA amplification MDM2 amplification CCNE1 amplification   Pertinent Negative Biomarkers Evaluated by NGS: ALK ATM BARD1 BRAF BRCA1 BRCA2 CDK12 CHECK1 CHEK2 EGFR  ERBB2 ESR1 FANCL FGFR2 FGFR3 IDH1 IDH2 KIT KRAS NRAS NTRK1 NTRK2 NTRK3 PALB2 PDGFRA RAD51B RAD51C  RAD51D RAD54L RET ROS1   Comment:  The above studies are performed and reported by [facility], [place]) as part of the [employer] (NGS) panel.  Pertinent results are summarized above.  Please see their separate reports for more detailed information  Addendum electronically signed by Dalbert Mayotte, MD on 09/03/2021 at  7:37 AM  Final Diagnosis    A.  CERVICAL MASS, EXCISION:       - CONSISTENT WITH  CARCINOSARCOMA (HIGH GRADE MALIGNANT BIPHASIC TUMOR)  Electronically signed by Dalbert Mayotte, MD on 08/15/2021 at  9:53 AM  Comment    The malignant epithelial component exhibits high-grade cytomorphology and immunostaining pattern consistent with serous carcinoma to include strong positive diffuse reactivity for p53, p16, and vimentin, focal subset CK7 and CK AE1/AE3 expression, with high Ki-67 staining (approximately 60%).  Neuroendocrine differentiation is present (CD56 negative but synaptophysin positive).  The stromal component is composed of malignant mitotically active cells and heterologous elements with focal chondroid differentiation (S100 positive).  SMA appears to decorate rare tumor cells and highlights vascular structures.  The tumor cells are non-reactive for CK20, WT1, GATA3, p40 and desmin.  Block A5 is referred to [facility] [place]) to perform molecular characterization.  These findings are shared with Dr. Jamie Kato on [id number] at [phone] hours by telephone conversation. This case received prospective intradepartmental quality assurance review.   Clinical Information    Cervical mass  Gross Description    A. Cervix Received fresh labeled with the patient's name and date of birth and "cervical mass frozen section" per container is an aggregate of red-tan friable soft tissue measuring approximately 5 x 5 x 3 cm in toto.  Representative sections are frozen for an intraoperative consultation.  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   Results rendered by Dr. Gasper Lloyd and reported to Dr. Paralee Cancel at [facility] on 08/11/2021 at 8:06 AM.  Microscopic Description    Microscopic examination supports the above diagnosis.  Single antibody immunostains  are performed to include: S100, p40, p16, p53, PAX8, CK7, CK20, vimentin, CK AE1/3, GATA3, WT1, desmin, SMA, synaptophysin, CD56, and Ki-67.  All controls are appropriately reactive. [id number], [id number], [id number], [id number], [id number], [id number] (15)          08/22/2021 Initial Diagnosis   Carcinosarcoma (HCC)   09/01/2021 Imaging   CT abdomen and pelvis There is marked enlargement of the uterus measuring 13.7 x 10 x 8 x 10.1 cm. There is inhomogeneous enhancement in the uterus consistent with malignant neoplasm.   There are enlarged lymph nodes in the retroperitoneum in the para-and paracaval regions measuring up to 12 mm in short axis. There are a few enlarged lymph nodes adjacent to the iliac vessels largest measuring 2.6 x 1.4 cm adjacent to the right external iliac vessels. Findings suggest possible metastatic lymphadenopathy. There is mild prominence of the pelvocaliceal system in the left kidney and proximal left ureter. Possibility of extrinsic compression or infiltrative process related to the uterine neoplasm in the distal course of left ureter causing mild obstruction  is not excluded.   Gallbladder stone.  Diverticulosis of colon.   Other findings as described in the body of the report.   09/01/2021 Imaging   US pelvis Large central uterine ill-defined mass approximately 7.7 cm diameter enlarging uterus likely representing the biopsy-proven neoplasm, likely of endometrial origin.   Questionable intramural leiomyoma posterior mid uterus 3.1 cm diameter.   The mass extend and the adnexal regions are poorly characterized but may potentially be better evaluated by MR imaging with and without contrast.   09/10/2021 Surgery   Pre-operative Diagnosis: carcinosarcoma of the uterus, adenopathy on imaging   Post-operative Diagnosis: same, stage IVB carcinosarcoma of the uterus   Operation: Diagnostic laparoscopy    Surgeon: Eugene Garnet MD Operative Findings: On EUA, cervix 2 cm dilated with tumor noted  within the os. Cervix otherwise normal appearing. Uterus enlarged, 12 cm, with limited mobility secondary to weight. Compression of the rectum from uterus although no direct invasion appreciated. On intra-abdominal inspection, adhesions noted between the liver and the anterior abdominal wall. Evidence of carcinomatosis involving liver surface, diaphragm, omentum, anterior abdominal wall. Surface of the uterus with obvious tumor infiltration. Small bowel free from uterus. Posteriorly, sigmoid densely adherent to the posterior uterus. Even with manipulator, unable to move the uterus anteriorly. Minimal ascites.    Given findings of stage IV disease and what would require large bowel resection and end ostomy, decision made to abort surgery in favor of neoadjuvant chemotherapy. I had discussed this as a distinct possibility when we had gotten her CT results as well as this morning prior to the surgery.    09/15/2021 Cancer Staging   Staging form: Corpus Uteri - Carcinoma and Carcinosarcoma, AJCC 8th Edition - Clinical stage from 09/15/2021: FIGO Stage IVB (cT3, cN2a, pM1) - Signed by Artis Delay, MD on 09/15/2021 Stage prefix: Initial diagnosis   09/24/2021 - 03/12/2022 Chemotherapy   Patient is on Treatment Plan : UTERINE ENDOMETRIAL Dostarlimab-gxly (500 mg) + Carboplatin (AUC 5) + Paclitaxel (175 mg/m2) q21d x 6 cycles / Dostarlimab-gxly (1000 mg) q42d x 6 cycles      10/17/2021 Procedure   Status post revision of nonfunctional right IJ port catheter, with new right IJ port catheter placed. Port is ready for use.     11/24/2021 Imaging   1. Today's study demonstrates a positive response to therapy with partial involution of large malignant appearing uterine mass. 2. Multiple prominent borderline enlarged and mildly enlarged retroperitoneal and pelvic lymph nodes are stable to minimally decreased in size compared to the prior study, potentially metastatic. No new lymphadenopathy is noted elsewhere in the chest,  abdomen or pelvis. 3. Multiple small pulmonary nodules measuring 5 mm or less in the lungs. These were incidentally imaged on the prior CT of the abdomen and pelvis 09/01/2021, and at this time are stable. The possibility of metastatic disease is not excluded, and close attention on follow-up studies is recommended. 4. Colonic diverticulosis without evidence of acute diverticulitis at this time. 5. Additional incidental findings, as above.     12/31/2021 Surgery   Robotic-assisted laparoscopic total hysterectomy with bilateral salpingo-oophorectomy, lysis of adhesions for approximately 40 minutes, cystoscopy   Findings: On EUA, moderately mobile enlarged uterus.  On intra-abdominal entry, filmy adhesions between the anterior liver and the diaphragm bilaterally.  No carcinomatosis noted on the diaphragm or liver.  Normal-appearing stomach, small bowel and omentum.  No peritoneal lesions in the lower abdominal cavity or pelvis.  No ascites.  Uterus enlarged measuring approximately 12-14 cm,  bulbous at the top.  Posterior 2 cm fibroid near the fundus.  Posterior aspect of the uterus densely adherent to the colon mesentery.  Somewhat dilated fluid-filled left fallopian tube.  Normal-appearing right adnexa.  Left ovary adherent to the broad ligament and sigmoid mesentery but otherwise normal-appearing.  Significant fibrosis bilaterally of the retroperitoneum.  No obvious adenopathy appreciated. On cystoscopy, bladder dome intact, good efflux noted from bilateral ureteral orifices.   12/31/2021 Pathology Results   A. CULDESAC, POSTERIOR ADHESIONS, EXCISION: -  Serosa and subserosal fibrous soft tissue with focal acute inflammation, reactive mesothelial hyperplasia and adhesions. -  Negative for malignancy.  B. UTERUS, CERVIX, BILATERAL TUBE AND OVAARIES, HYSTERECTOMY: - -  Carcinosarcoma (malignant mixed Mullerian tumor) with full-thickness posterior myometrial involvement with focal serosal present  and extension into lower uterine segment and extensively in the cervical stroma. 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 Not currently on therapy. * Tracking Code: BO * EXAM: CT CHEST, ABDOMEN, AND PELVIS WITH CONTRAST TECHNIQUE: Multidetector CT imaging of the chest, abdomen and pelvis was performed following the standard protocol during bolus administration of intravenous contrast. RADIATION DOSE REDUCTION: This exam was performed according to the departmental dose-optimization program which includes automated exposure control, adjustment of the mA and/or kV according to patient size and/or use of iterative reconstruction technique. CONTRAST:  OMNIPAQUE IOHEXOL 300 MG/ML  SOLN COMPARISON:  Multiple priors including CT April 03, 2022 and November 21, 2021  FINDINGS: CT CHEST FINDINGS Cardiovascular: Accessed right chest Port-A-Cath with tip at the superior cavoatrial junction. Normal caliber thoracic aorta. No central pulmonary embolus on this nondedicated study. Normal size heart. No significant pericardial effusion/thickening. Mediastinum/Nodes: No suspicious thyroid nodule. No pathologically enlarged mediastinal, hilar or axillary lymph nodes. Patulous esophagus. Lungs/Pleura: Scattered bilateral pulmonary nodules are stable from prior examination. No new suspicious pulmonary nodules or masses. For reference: -pulmonary nodule in the medial right lower lobe measuring 5 mm on image 74/4 is unchanged. -3 mm right middle lobe pulmonary nodule on image 89/4 is unchanged. Musculoskeletal: No aggressive lytic or blastic lesion of bone. Multilevel degenerative change of the spine. CT ABDOMEN PELVIS FINDINGS Hepatobiliary: No suspicious hepatic lesion. Gallbladder is unremarkable. No biliary ductal dilation. Pancreas: No pancreatic ductal dilation or evidence of acute inflammation. Spleen: No splenomegaly. Adrenals/Urinary Tract: Bilateral adrenal glands appear normal. No hydronephrosis. Kidneys demonstrate symmetric enhancement. Urinary bladder is unremarkable for degree of distension. Stomach/Bowel: Radiopaque enteric contrast material traverses the splenic flexure. Stomach is unremarkable for degree of distension. No pathologic dilation  of small or large bowel. No evidence of acute bowel inflammation. Vascular/Lymphatic: Normal caliber abdominal aorta. The portal, splenic and superior mesenteric veins are patent. New retroperitoneal, iliac side chain and pelvic sidewall adenopathy. For reference: -retrocaval right upper quadrant lymph node measures 10 mm in short axis on image 65/2 -aortocaval lymph node measures 18 mm in short axis on image 77/2 -left common iliac lymph node measures 2.3 cm in short axis on image 83/2 -left obturator lymph node measures 2.1 cm in short  axis on image 94/2. Reproductive: Uterus is surgically absent with a new 2.6 x 2.2 cm nodule near the left vaginal cuff on image 98/2. Other: Mild mesenteric stranding similar prior. No discrete peritoneal or omental nodularity. Nonspecific subcutaneous edema. Musculoskeletal: Round sclerotic osseous lesion in the L1 vertebral body is unchanged. No new aggressive lytic or blastic lesion of bone. Multilevel degenerative changes spine. IMPRESSION: 1. New abdominopelvic adenopathy, compatible with recurrent nodal metastatic disease. 2. New 2.6 cm nodule near the left vaginal cuff, concerning for recurrent local disease versus and adjacent nodal metastasis. 3. Stable round sclerotic osseous lesion in the L1 vertebral body. 4. Scattered bilateral pulmonary nodules are stable from prior examination. No new suspicious pulmonary nodules or masses. These results will be called to the ordering clinician or representative by the Radiologist Assistant, and communication documented in the PACS or [employer]. Electronically Signed   By: Maudry Mayhew M.D.   On: 10/22/2022 11:37      11/09/2022 - 03/15/2023 Chemotherapy   Patient is on Treatment Plan : UTERINE ENDOMETRIAL Dostarlimab-gxly (500 mg) + Carboplatin (AUC 5) + Paclitaxel (175 mg/m2) q21d x 6 cycles / Dostarlimab-gxly (1000 mg) q42d x 6 cycles      01/04/2023 Imaging   CT CHEST ABDOMEN PELVIS W CONTRAST  Result Date: 01/04/2023 CLINICAL DATA:  High-grade tumor assessment endometrial cancer. Assess response to the chemotherapy. * Tracking Code: BO * EXAM: CT CHEST, ABDOMEN, AND PELVIS WITH CONTRAST TECHNIQUE: Multidetector CT imaging of the chest, abdomen and pelvis was performed following the standard protocol during bolus administration of intravenous contrast. RADIATION DOSE REDUCTION: This exam was performed according to the departmental dose-optimization program which includes automated exposure control, adjustment of the mA and/or kV according to  patient size and/or use of iterative reconstruction technique. CONTRAST:  OMNIPAQUE IOHEXOL 300 MG/ML  SOLN COMPARISON:  CT 10/22/2022. FINDINGS: CT CHEST FINDINGS Cardiovascular: Right upper chest port is accessed. Tip seen as far as the central SVC. Heart is nonenlarged. No pericardial effusion. Mild atherosclerotic changes along the aorta. Separate origin of the left vertebral artery directly from the aortic arch, normal variant. Mediastinum/Nodes: Preserved thyroid gland. Normal caliber thoracic esophagus. No specific abnormal lymph node enlargement identified in the axillary regions, hilum or mediastinum. Small nodes identified including pre cardiac are stable and not pathologic by size criteria. Lungs/Pleura: No consolidation, pneumothorax or effusion. Stable tiny nodules identified. These include 5 mm nodule left upper lobe series 6, image 40, 3 mm nodule middle lobe series 6, image 97, medial right lower lobe nodule series 6, image 80 measuring 5 mm. No new lung nodularity clearly identified. There is also some nodular which are calcified such as left lower lobe superior segment series 6, image 61. Dependent areas of atelectasis and scarring. Musculoskeletal: Curvature of the spine with some degenerative changes. CT ABDOMEN PELVIS FINDINGS Hepatobiliary: Stones in the gallbladder. Gallbladder is nondilated. Patent portal vein. No space-occupying liver lesion. Pancreas: Unremarkable. No pancreatic ductal dilatation or surrounding inflammatory changes. Spleen:  Normal in size without focal abnormality. Adrenals/Urinary Tract: Adrenal glands are preserved. No enhancing renal mass or collecting system dilatation. The ureters have normal course and caliber extending down to the bladder. Slight wall thickening of the urinary bladder, new from previous. There is increasing adjacent stranding as well in the pelvis including extending pelvic sidewall and presacral spaces. Stomach/Bowel: Oral contrast was  administered. The stomach is mildly distended with a air and debris. Small bowel is nondilated. Large bowel has a normal course and caliber with moderate stool. Vascular/Lymphatic: Normal caliber aorta and IVC with scattered vascular calcifications. Several abnormal lymph nodes are once again identified along the retroperitoneum and pelvis. Specific lesions will follow up for continuity. These include left common iliac chain node which has a short axis previously of 2.3 cm and today 1.8 cm on series 2, image 85. Retrocaval node which previously had a short axis of 10 mm, today on series 2, image 67 11 mm. Aortocaval node short axis of 18 mm and today when measured in a similar fashion 14 mm on series 2, image 79. Left obturator node short axis of 21 mm on the prior and today series 2, image 96 of 12 mm. No new nodal enlargement. Increasing retroperitoneal stranding. Reproductive: Absence of the uterus. Soft tissue node in the surgical bed which previously measured 2.6 x 2.2 cm, today is smaller on series 2, image 99 at 2.1 by 1.0 cm. Other: Small umbilical fat containing hernia. Musculoskeletal: Curvature and degenerative changes along the spine. IMPRESSION: Multifocal abnormal lymph node enlargement in the abdomen and pelvis. Many of these smaller. There is increasing adjacent stranding. Decreasing soft tissue nodule in the surgical bed in the low pelvis. No developing new mass lesion, fluid collection or lymph node enlargement. Stable small lung nodules. Electronically Signed   By: Karen Kays M.D.   On: 01/04/2023 11:43      03/29/2023 Imaging   CT CHEST ABDOMEN PELVIS W CONTRAST Result Date: 03/29/2023 CLINICAL DATA:  Endometrial cancer, monitor, high-risk. * Tracking Code: BO * EXAM: CT CHEST, ABDOMEN, AND PELVIS WITH CONTRAST TECHNIQUE: Multidetector CT imaging of the chest, abdomen and pelvis was performed following the standard protocol during bolus administration of intravenous contrast. RADIATION DOSE  REDUCTION: This exam was performed according to the departmental dose-optimization program which includes automated exposure control, adjustment of the mA and/or kV according to patient size and/or use of iterative reconstruction technique. CONTRAST:  OMNIPAQUE IOHEXOL 300 MG/ML  SOLN COMPARISON:  Multiple priors including most recent CT January 04, 2023. FINDINGS: CT CHEST FINDINGS Cardiovascular: Accessed right chest Port-A-Cath with tip near the superior cavoatrial junction. Normal caliber thoracic aorta. Normal size heart. No significant pericardial effusion/thickening. Mediastinum/Nodes: No suspicious thyroid nodule. No pathologically enlarged mediastinal, hilar or axillary lymph nodes. The esophagus is grossly unremarkable. Lungs/Pleura: New subsolid 6 mm subpleural right lower lobe pulmonary nodule on image 95/6. Other scattered tiny pulmonary nodules are stable.  For reference: -left upper lobe pulmonary nodule measures 5 mm on image 39/6, unchanged. -right middle lobe pulmonary nodule measures 3 mm on image 95/6. Musculoskeletal: No aggressive lytic or blastic lesion of bone. Multilevel degenerative change of the spine. CT ABDOMEN PELVIS FINDINGS Hepatobiliary: No suspicious hepatic lesion. Cholelithiasis without findings of acute cholecystitis. No biliary ductal dilation. Pancreas: No pancreatic ductal dilation or evidence of acute inflammation. Spleen: No splenomegaly. Adrenals/Urinary Tract: No suspicious adrenal nodule/mass. No hydronephrosis. Kidneys demonstrate symmetric enhancement. Symmetric wall thickening with urothelial hyperenhancement of the urinary bladder. Stomach/Bowel: No evidence  of bowel obstruction or acute bowel inflammation. Scattered colonic diverticulosis. Vascular/Lymphatic: Aortic atherosclerosis. Portal, splenic and superior mesenteric veins are patent. Overall increased size of the abdominopelvic lymph nodes. For reference: -left common iliac lymph node measures 19 mm on  image 84/2 previously 18 mm -retrocaval lymph node measures 2 cm on image 67/2 previously 11 mm. -aortocaval lymph node measures 15 mm in short axis on image 75/2 previously 14 mm. -left obturator lymph node measures 12 mm in short axis on image 97/2, unchanged. Reproductive: Increased size of the soft tissue nodule in the surgical bed now measuring 17 x 17 mm on image 100/2 previously 2.1 x 1.0 cm Other: Increased abdominopelvic free fluid. Small fat and fluid containing ventral/umbilical hernia. Nonspecific subcutaneous edema. Musculoskeletal: No aggressive lytic or blastic lesion of bone. Decreased density in the sacrum commonly reflects postradiation change. IMPRESSION: 1. Increased size of the soft tissue nodule in the surgical bed now measuring 17 x 17 mm. 2. Overall increased size of the abdominopelvic lymph nodes. 3. New subsolid 6 mm subpleural right lower lobe pulmonary nodule, nonspecific but favored to reflect an infectious or inflammatory etiology. However this warrants attention on short-term interval follow-up imaging. Other small pulmonary nodules are stable. 4. 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 5. Increased abdominopelvic free fluid. 6.  Aortic Atherosclerosis (ICD10-I70.0). Electronically Signed   By: Maudry Mayhew M.D.   On: 03/29/2023 11:26        REVIEW OF SYSTEMS:   Constitutional: Denies fevers, chills or abnormal weight loss Eyes: Denies blurriness of vision Ears, nose, mouth, throat, and face: Denies mucositis or sore throat Respiratory: Denies cough, dyspnea or wheezes Cardiovascular: Denies palpitation, chest discomfort Gastrointestinal:  Denies nausea, heartburn or change in bowel habits Skin: Denies abnormal skin rashes Lymphatics: Denies new lymphadenopathy or easy bruising Neurological:Denies numbness, tingling or new weaknesses Behavioral/Psych: Mood is stable, no new changes   Extremities: No lower extremity edema All other systems were reviewed with the patient and are negative.  I have reviewed the past medical history, past surgical history, social history and family history with the patient and they are unchanged from previous note.  ALLERGIES:  is allergic to hydrocodone-acetaminophen and oxycodone-acetaminophen.  MEDICATIONS:  Current Outpatient Medications  Medication Sig Dispense Refill   estradiol (ESTRACE VAGINAL) 0.1 MG/GM vaginal cream Place 1 Applicatorful vaginally at bedtime. 42.5 g 12   allopurinol (ZYLOPRIM) 300 MG tablet Take 300 mg by mouth daily.     amiodarone (PACERONE) 200 MG tablet Take 1 tablet (200 mg total) by mouth daily. 30 tablet 3   ampicillin (PRINCIPEN) 500 MG capsule Take 1 capsule (500 mg total) by mouth 3 (three) times daily. 21 capsule 0   apixaban (ELIQUIS) 5 MG TABS tablet Take 1 tablet (5 mg total) by mouth 2 (two) times daily. 14 tablet 0   ascorbic acid (VITAMIN C) 250 MG tablet Take 250 mg by mouth daily.     Biotin (BIOTIN 5000) 5 MG CAPS Take 5 mg by mouth daily.     dexamethasone (DECADRON) 4 MG tablet Take 2 tabs at the night before and 2 tab the morning of chemotherapy, every 3 weeks, by mouth x 6 cycles 24 tablet 6   furosemide (LASIX) 20 MG tablet TAKE 1 TABLET(20 MG) BY MOUTH DAILY 30 tablet 3   lidocaine-prilocaine (EMLA) cream Apply to affected area once 30 g 3   metoprolol succinate (TOPROL-XL) 25 MG 24 hr tablet Take 25 mg  by mouth daily.     Multiple Vitamins-Minerals (MULTIVITAMIN WITH MINERALS) tablet Take 1 tablet by mouth at bedtime. Centrum     ondansetron (ZOFRAN) 8 MG tablet Take 1 tablet (8 mg total) by mouth every 8 (eight) hours as needed for nausea or vomiting. Start on the third day after chemotherapy. 30 tablet 1   polyethylene glycol (MIRALAX / GLYCOLAX) 17 g packet Take 17 g by mouth daily.     potassium chloride (KLOR-CON) 10 MEQ tablet Take 10 mEq by mouth daily.     prochlorperazine  (COMPAZINE) 10 MG tablet Take 1 tablet (10 mg total) by mouth every 6 (six) hours as needed for nausea or vomiting. 30 tablet 1   simvastatin (ZOCOR) 10 MG tablet Take 10 mg by mouth daily at 6 PM.     No current facility-administered medications for this visit.    PHYSICAL EXAMINATION: ECOG PERFORMANCE STATUS: 2 - Symptomatic, <50% confined to bed  LABORATORY DATA:  I have reviewed the data as listed    Latest Ref Rng & Units 03/15/2023    8:57 AM 02/22/2023    8:33 AM 02/01/2023    9:11 AM  CMP  Glucose 70 - 99 mg/dL 161  096  045   BUN 8 - 23 mg/dL 15  13  21    Creatinine 0.44 - 1.00 mg/dL 4.09  8.11  9.14   Sodium 135 - 145 mmol/L 139  137  139   Potassium 3.5 - 5.1 mmol/L 3.6  4.1  3.7   Chloride 98 - 111 mmol/L 102  102  100   CO2 22 - 32 mmol/L 27  25  29    Calcium 8.9 - 10.3 mg/dL 9.1  9.5  9.5   Total Protein 6.5 - 8.1 g/dL 7.5  7.4  7.5   Total Bilirubin 0.0 - 1.2 mg/dL 0.5  0.5  0.5   Alkaline Phos 38 - 126 U/L 44  48  48   AST 15 - 41 U/L 19  21  21    ALT 0 - 44 U/L 7  9  9      Lab Results  Component Value Date   WBC 5.4 03/15/2023   HGB 8.9 (L) 03/15/2023   HCT 28.2 (L) 03/15/2023   MCV 91.6 03/15/2023   PLT 265 03/15/2023   NEUTROABS 4.3 03/15/2023     RADIOGRAPHIC STUDIES: I have personally reviewed the radiological images as listed and agreed with the findings in the report. 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 * Tracking Code: BO * EXAM: CT CHEST, ABDOMEN, AND PELVIS WITH CONTRAST TECHNIQUE: Multidetector CT imaging of the chest, abdomen and pelvis was performed following the standard protocol during bolus administration of intravenous contrast. RADIATION DOSE REDUCTION: This exam was performed according to the departmental dose-optimization program which includes automated exposure control, adjustment of the mA and/or kV according to patient size and/or use of iterative  reconstruction technique. CONTRAST:  OMNIPAQUE IOHEXOL 300 MG/ML  SOLN COMPARISON:  Multiple priors including most recent CT January 04, 2023. FINDINGS: CT CHEST FINDINGS Cardiovascular: Accessed right chest Port-A-Cath with tip near the superior cavoatrial junction. Normal caliber thoracic aorta. Normal size heart. No significant pericardial effusion/thickening. Mediastinum/Nodes: No suspicious thyroid nodule. No pathologically enlarged mediastinal, hilar or axillary lymph nodes. The esophagus is grossly unremarkable. Lungs/Pleura: New subsolid 6 mm subpleural right lower lobe pulmonary nodule on image 95/6. Other scattered tiny pulmonary nodules are stable.  For  reference: -left upper lobe pulmonary nodule measures 5 mm on image 39/6, unchanged. -right middle lobe pulmonary nodule measures 3 mm on image 95/6. Musculoskeletal: No aggressive lytic or blastic lesion of bone. Multilevel degenerative change of the spine. CT ABDOMEN PELVIS FINDINGS Hepatobiliary: No suspicious hepatic lesion. Cholelithiasis without findings of acute cholecystitis. No biliary ductal dilation. Pancreas: No pancreatic ductal dilation or evidence of acute inflammation. Spleen: No splenomegaly. Adrenals/Urinary Tract: No suspicious adrenal nodule/mass. No hydronephrosis. Kidneys demonstrate symmetric enhancement. Symmetric wall thickening with urothelial hyperenhancement of the urinary bladder. Stomach/Bowel: No evidence of bowel obstruction or acute bowel inflammation. Scattered colonic diverticulosis. Vascular/Lymphatic: Aortic atherosclerosis. Portal, splenic and superior mesenteric veins are patent. Overall increased size of the abdominopelvic lymph nodes. For reference: -left common iliac lymph node measures 19 mm on image 84/2 previously 18 mm -retrocaval lymph node measures 2 cm on image 67/2 previously 11 mm. -aortocaval lymph node measures 15 mm in short axis on image 75/2 previously 14 mm. -left obturator lymph node measures  12 mm in short axis on image 97/2, unchanged. Reproductive: Increased size of the soft tissue nodule in the surgical bed now measuring 17 x 17 mm on image 100/2 previously 2.1 x 1.0 cm Other: Increased abdominopelvic free fluid. Small fat and fluid containing ventral/umbilical hernia. Nonspecific subcutaneous edema. Musculoskeletal: No aggressive lytic or blastic lesion of bone. Decreased density in the sacrum commonly reflects postradiation change. IMPRESSION: 1. Increased size of the soft tissue nodule in the surgical bed now measuring 17 x 17 mm. 2. Overall increased size of the abdominopelvic lymph nodes. 3. New subsolid 6 mm subpleural right lower lobe pulmonary nodule, nonspecific but favored to reflect an infectious or inflammatory etiology. However this warrants attention on short-term interval follow-up imaging. Other small pulmonary nodules are stable. 4. 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 5. Increased abdominopelvic free fluid. 6.  Aortic Atherosclerosis (ICD10-I70.0). Electronically Signed   By: Maudry Mayhew M.D.   On: 03/29/2023 11:26    I discussed the assessment and treatment plan with the patient. The patient was provided an opportunity to ask questions and all were answered. The patient agreed with the plan and demonstrated an understanding of the instructions. The patient was advised to call back or seek an in-person evaluation if the symptoms worsen or if the condition fails to improve as anticipated.    I spent 40 minutes for the appointment reviewing test results, discuss management and coordination of care.  Artis Delay, MD 04/02/2023 9:45 AM

## 2023-04-02 NOTE — Progress Notes (Signed)
DISCONTINUE OFF PATHWAY REGIMEN - Uterine   [id number]:Carboplatin AUC=5 IV D1 + Dostarlimab 500 mg IV D1 + Paclitaxel 175 mg/m2 IV D1 x 6 Cycles Followed by Dostarlimab 1,000 mg IV D1 q42 Days:   Cycles 1 through 6: A cycle is every 21 days:     Dostarlimab-gxly      Paclitaxel      Carboplatin    Cycles 7 and beyond: A cycle is every 42 days:     Dostarlimab-gxly   **Always confirm dose/schedule in your pharmacy ordering system**  PRIOR TREATMENT: Off Pathway: Carboplatin AUC=5 IV D1 + Dostarlimab 500 mg IV D1 + Paclitaxel 175 mg/m2 IV D1 x 6 Cycles Followed by Dostarlimab 1,000 mg IV D1 q42 Days  START OFF PATHWAY REGIMEN - Uterine   [id number]:Doxorubicin 60 mg/m2 IV D1 q21 Days:   A cycle is every 21 days:     Doxorubicin   **Always confirm dose/schedule in your pharmacy ordering system**  Patient Characteristics: Carcinosarcoma, Recurrent/Progressive Disease, Second Line, 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 2+) Histology: Carcinosarcoma Therapeutic Status: Recurrent or Progressive Disease Line of Therapy: Second Line Time to Recurrence: Relapse < 12 Months From Prior Therapy HER2 Expression Status by IHC: Negative (IHC 0, 1+) Intent of Therapy: Non-Curative / Palliative Intent, Discussed with Patient

## 2023-04-02 NOTE — Telephone Encounter (Signed)
Family can only bring her in on Mondays Please help schedule echo to be done on 3/3 I will send LOS to start chemo on 3/10 & 3/31. I will do virtual visit on 3/28

## 2023-04-02 NOTE — Telephone Encounter (Signed)
Called daughter back. Given message from Dr. Bertis Ruddy. The earliest echo appt available is 3/7 at [facility] at 1 pm, arrive 15 mins early. Given address, daughter verbalized understanding.

## 2023-04-02 NOTE — Progress Notes (Signed)
Order requisition send to [facility] on accession [id number] per Dr. Bertis Ruddy.

## 2023-04-03 ENCOUNTER — Other Ambulatory Visit: Payer: Self-pay

## 2023-04-04 ENCOUNTER — Other Ambulatory Visit: Payer: Self-pay

## 2023-04-05 ENCOUNTER — Encounter: Payer: Self-pay | Admitting: Hematology and Oncology

## 2023-04-05 ENCOUNTER — Other Ambulatory Visit: Payer: Medicare HMO

## 2023-04-05 ENCOUNTER — Ambulatory Visit: Payer: Medicare HMO

## 2023-04-07 ENCOUNTER — Other Ambulatory Visit: Payer: Self-pay | Admitting: Hematology and Oncology

## 2023-04-07 NOTE — Telephone Encounter (Signed)
 [place]orsche,  Please call her daughter, Orlie Pollen and let her know I do not recommend to wait that long I recommend patient and family to consider treatment closer to home If she insists on the time line: echo on 3/31 and 4/7, let me know and I will send scheduling msg Delaying treatment that long will jeopardize her health, I do not recommend, if they insist, let me know, as long as as family is aware of the risks

## 2023-04-07 NOTE — Telephone Encounter (Signed)
 Sent scheduling msg

## 2023-04-07 NOTE — Telephone Encounter (Signed)
 If she gets echo on 3/7, she can get chemo on 3/10, ask if daughter ok, I will send scheduling msg

## 2023-04-14 ENCOUNTER — Other Ambulatory Visit: Payer: Self-pay

## 2023-04-14 NOTE — Telephone Encounter (Deleted)
Prescription refill request for Eliquis received. Indication: Last office visit: Scr: Age:  Weight:

## 2023-04-15 ENCOUNTER — Telehealth: Payer: Self-pay | Admitting: Cardiology

## 2023-04-15 DIAGNOSIS — I4891 Unspecified atrial fibrillation: Secondary | ICD-10-CM

## 2023-04-15 MED ORDER — APIXABAN 5 MG PO TABS: 5.0000 mg | ORAL_TABLET | Freq: Two times a day (BID) | ORAL | 0 refills | Status: DC

## 2023-04-15 NOTE — Telephone Encounter (Signed)
*  STAT* If patient is at the pharmacy, call can be transferred to refill team.   1. Which medications need to be refilled? (please list name of each medication and dose if known)  apixaban (ELIQUIS) 5 MG TABS tablet  2. Which pharmacy/location (including street and city if local pharmacy) is medication to be sent to? [facility]  3. Do they need a 30 day or 90 day supply?   90 day supply  Phone#: [phone] (opttion 2, ref#: [phone])

## 2023-04-15 NOTE — Telephone Encounter (Signed)
 Prescription refill request for Eliquis received. Indication: AF Last office visit: 4/24 Scr:  0.83 Age:  73 Weight:  78.9  5 mg bid is appropriate dose.

## 2023-04-16 ENCOUNTER — Other Ambulatory Visit (HOSPITAL_COMMUNITY): Payer: Medicare HMO

## 2023-04-16 ENCOUNTER — Ambulatory Visit (HOSPITAL_COMMUNITY)
Admission: RE | Admit: 2023-04-16 | Discharge: 2023-04-16 | Disposition: A | Discharge status: Home / Self Care | Payer: Medicare HMO | Source: Ambulatory Visit | Attending: Hematology and Oncology | Admitting: Hematology and Oncology

## 2023-04-16 DIAGNOSIS — I4891 Unspecified atrial fibrillation: Secondary | ICD-10-CM | POA: Insufficient documentation

## 2023-04-16 DIAGNOSIS — I10 Essential (primary) hypertension: Secondary | ICD-10-CM | POA: Insufficient documentation

## 2023-04-16 DIAGNOSIS — I351 Nonrheumatic aortic (valve) insufficiency: Secondary | ICD-10-CM | POA: Insufficient documentation

## 2023-04-16 DIAGNOSIS — C549 Malignant neoplasm of corpus uteri, unspecified: Secondary | ICD-10-CM | POA: Diagnosis present

## 2023-04-16 LAB — ECHOCARDIOGRAM COMPLETE
AR max vel: 1.95 cm2
AV Area VTI: 2.15 cm2
AV Area mean vel: 1.86 cm2
AV Mean grad: 5 mmHg
AV Peak grad: 9.5 mmHg
Ao pk vel: 1.54 m/s
Area-P 1/2: 3.4 cm2
MV VTI: 2.03 cm2
P 1/2 time: 1002 ms
S' Lateral: 2.7 cm

## 2023-04-16 NOTE — Progress Notes (Signed)
*  PRELIMINARY RESULTS* Echocardiogram 2D Echocardiogram has been performed.  Sarah Wu 04/16/2023, 10:34 AM

## 2023-04-19 ENCOUNTER — Inpatient Hospital Stay: Payer: Medicare HMO | Attending: Hematology and Oncology

## 2023-04-19 ENCOUNTER — Telehealth: Payer: Self-pay | Admitting: Cardiology

## 2023-04-19 ENCOUNTER — Inpatient Hospital Stay: Payer: Medicare HMO

## 2023-04-19 ENCOUNTER — Telehealth: Payer: Self-pay | Admitting: *Deleted

## 2023-04-19 ENCOUNTER — Encounter: Payer: Self-pay | Admitting: Hematology and Oncology

## 2023-04-19 ENCOUNTER — Other Ambulatory Visit (HOSPITAL_COMMUNITY): Payer: Self-pay

## 2023-04-19 VITALS — BP 109/68 | HR 63 | Temp 98.1°F | Resp 18 | Wt 167.1 lb

## 2023-04-19 DIAGNOSIS — Z5111 Encounter for antineoplastic chemotherapy: Secondary | ICD-10-CM | POA: Diagnosis present

## 2023-04-19 DIAGNOSIS — Z5112 Encounter for antineoplastic immunotherapy: Secondary | ICD-10-CM | POA: Insufficient documentation

## 2023-04-19 DIAGNOSIS — Z79899 Other long term (current) drug therapy: Secondary | ICD-10-CM | POA: Insufficient documentation

## 2023-04-19 DIAGNOSIS — E876 Hypokalemia: Secondary | ICD-10-CM

## 2023-04-19 DIAGNOSIS — D61818 Other pancytopenia: Secondary | ICD-10-CM | POA: Diagnosis not present

## 2023-04-19 DIAGNOSIS — C541 Malignant neoplasm of endometrium: Secondary | ICD-10-CM | POA: Insufficient documentation

## 2023-04-19 DIAGNOSIS — C549 Malignant neoplasm of corpus uteri, unspecified: Secondary | ICD-10-CM

## 2023-04-19 LAB — CBC WITH DIFFERENTIAL (CANCER CENTER ONLY)
Abs Immature Granulocytes: 0.07 10*3/uL (ref 0.00–0.07)
Basophils Absolute: 0 10*3/uL (ref 0.0–0.1)
Basophils Relative: 0 %
Eosinophils Absolute: 0 10*3/uL (ref 0.0–0.5)
Eosinophils Relative: 0 %
HCT: 28.7 % — ABNORMAL LOW (ref 36.0–46.0)
Hemoglobin: 8.9 g/dL — ABNORMAL LOW (ref 12.0–15.0)
Immature Granulocytes: 1 %
Lymphocytes Relative: 12 %
Lymphs Abs: 1 10*3/uL (ref 0.7–4.0)
MCH: 28.5 pg (ref 26.0–34.0)
MCHC: 31 g/dL (ref 30.0–36.0)
MCV: 92 fL (ref 80.0–100.0)
Monocytes Absolute: 0.2 10*3/uL (ref 0.1–1.0)
Monocytes Relative: 3 %
Neutro Abs: 6.7 10*3/uL (ref 1.7–7.7)
Neutrophils Relative %: 84 %
Platelet Count: 351 10*3/uL (ref 150–400)
RBC: 3.12 MIL/uL — ABNORMAL LOW (ref 3.87–5.11)
RDW: 18.7 % — ABNORMAL HIGH (ref 11.5–15.5)
WBC Count: 8 10*3/uL (ref 4.0–10.5)
nRBC: 0 % (ref 0.0–0.2)

## 2023-04-19 LAB — CMP (CANCER CENTER ONLY)
ALT: 17 U/L (ref 0–44)
AST: 32 U/L (ref 15–41)
Albumin: 3.8 g/dL (ref 3.5–5.0)
Alkaline Phosphatase: 52 U/L (ref 38–126)
Anion gap: 12 (ref 5–15)
BUN: 16 mg/dL (ref 8–23)
CO2: 35 mmol/L — ABNORMAL HIGH (ref 22–32)
Calcium: 8.4 mg/dL — ABNORMAL LOW (ref 8.9–10.3)
Chloride: 91 mmol/L — ABNORMAL LOW (ref 98–111)
Creatinine: 0.88 mg/dL (ref 0.44–1.00)
GFR, Estimated: >60 mL/min (ref >60)
Glucose, Bld: 143 mg/dL — ABNORMAL HIGH (ref 70–99)
Potassium: 2.3 mmol/L — CL (ref 3.5–5.1)
Sodium: 138 mmol/L (ref 135–145)
Total Bilirubin: 1.2 mg/dL (ref 0.0–1.2)
Total Protein: 7.8 g/dL (ref 6.5–8.1)

## 2023-04-19 MED ORDER — POTASSIUM CHLORIDE 10 MEQ/100ML IV SOLN
10.0000 meq | INTRAVENOUS | Status: AC
  Administered 2023-04-19 (×2): 10 meq via INTRAVENOUS
  Filled 2023-04-19 (×2): qty 100

## 2023-04-19 MED ORDER — DEXAMETHASONE SODIUM PHOSPHATE 10 MG/ML IJ SOLN
10.0000 mg | Freq: Once | INTRAMUSCULAR | Status: AC
  Administered 2023-04-19: 10 mg via INTRAVENOUS
  Filled 2023-04-19: qty 1

## 2023-04-19 MED ORDER — FOSAPREPITANT DIMEGLUMINE INJECTION 150 MG
150.0000 mg | Freq: Once | INTRAVENOUS | Status: AC
  Administered 2023-04-19: 150 mg via INTRAVENOUS
  Filled 2023-04-19: qty 150

## 2023-04-19 MED ORDER — POTASSIUM CHLORIDE ER 10 MEQ PO TBCR
10.0000 meq | EXTENDED_RELEASE_TABLET | Freq: Three times a day (TID) | ORAL | 0 refills | Status: DC
  Filled 2023-04-19: qty 60, 20d supply, fill #0

## 2023-04-19 MED ORDER — APIXABAN 5 MG PO TABS: 5.0000 mg | ORAL_TABLET | Freq: Two times a day (BID) | ORAL | 5 refills | Status: DC

## 2023-04-19 MED ORDER — DOXORUBICIN HCL CHEMO IV INJECTION 2 MG/ML
60.0000 mg/m2 | Freq: Once | INTRAVENOUS | Status: AC
  Administered 2023-04-19: 116 mg via INTRAVENOUS
  Filled 2023-04-19: qty 58

## 2023-04-19 MED ORDER — SODIUM CHLORIDE 0.9% FLUSH
10.0000 mL | INTRAVENOUS | Status: DC | PRN
  Administered 2023-04-19: 10 mL

## 2023-04-19 MED ORDER — PALONOSETRON HCL INJECTION 0.25 MG/5ML
0.2500 mg | Freq: Once | INTRAVENOUS | Status: AC
  Administered 2023-04-19: 0.25 mg via INTRAVENOUS
  Filled 2023-04-19: qty 5

## 2023-04-19 MED ORDER — HEPARIN SOD (PORK) LOCK FLUSH 100 UNIT/ML IV SOLN
500.0000 [IU] | Freq: Once | INTRAVENOUS | Status: AC | PRN
Start: 2023-04-19 — End: 2023-04-19
  Administered 2023-04-19: 500 [IU]

## 2023-04-19 MED ORDER — SODIUM CHLORIDE 0.9 % IV SOLN: INTRAVENOUS | Status: DC

## 2023-04-19 MED ORDER — SODIUM CHLORIDE 0.9% FLUSH
10.0000 mL | Freq: Once | INTRAVENOUS | Status: AC
  Administered 2023-04-19: 10 mL

## 2023-04-19 NOTE — Patient Instructions (Addendum)
 [facility] - A DEPT OF [facility]  Discharge Instructions: Thank you for choosing [facility] to provide your oncology and hematology care.   If you have a lab appointment with the Cancer Center, please go directly to the Cancer Center and check in at the registration area.   Wear comfortable clothing and clothing appropriate for easy access to any Portacath or PICC line.   We strive to give you quality time with your provider. You may need to reschedule your appointment if you arrive late (15 or more minutes).  Arriving late affects you and other patients whose appointments are after yours.  Also, if you miss three or more appointments without notifying the office, you may be dismissed from the clinic at the provider's discretion.      For prescription refill requests, have your pharmacy contact our office and allow 72 hours for refills to be completed.    Today you received the following chemotherapy and/or immunotherapy agents: Doxorubicin.       To help prevent nausea and vomiting after your treatment, we encourage you to take your nausea medication as directed.  BELOW ARE SYMPTOMS THAT SHOULD BE REPORTED IMMEDIATELY: *FEVER GREATER THAN 100.4 F (38 C) OR HIGHER *CHILLS OR SWEATING *NAUSEA AND VOMITING THAT IS NOT CONTROLLED WITH YOUR NAUSEA MEDICATION *UNUSUAL SHORTNESS OF BREATH *UNUSUAL BRUISING OR BLEEDING *URINARY PROBLEMS (pain or burning when urinating, or frequent urination) *BOWEL PROBLEMS (unusual diarrhea, constipation, pain near the anus) TENDERNESS IN MOUTH AND THROAT WITH OR WITHOUT PRESENCE OF ULCERS (sore throat, sores in mouth, or a toothache) UNUSUAL RASH, SWELLING OR PAIN  UNUSUAL VAGINAL DISCHARGE OR ITCHING   Items with * indicate a potential emergency and should be followed up as soon as possible or go to the Emergency Department if any problems should occur.  Please show the CHEMOTHERAPY ALERT CARD or  IMMUNOTHERAPY ALERT CARD at check-in to the Emergency Department and triage nurse.  Should you have questions after your visit or need to cancel or reschedule your appointment, please contact [facility] - A DEPT OF [facility]  Dept: [phone]  and follow the prompts.  Office hours are 8:00 a.m. to 4:30 p.m. Monday - Friday. Please note that voicemails left after 4:00 p.m. may not be returned until the following business day.  We are closed weekends and major holidays. You have access to a nurse at all times for urgent questions. Please call the main number to the clinic Dept: [phone] and follow the prompts.   For any non-urgent questions, you may also contact your provider using MyChart. We now offer e-Visits for anyone 33 and older to request care online for non-urgent symptoms. For details visit [email or web address].   Also download the MyChart app! Go to the app store, search "MyChart", open the app, select [place], and log in with your MyChart username and password.  Doxorubicin Injection What is this medication? DOXORUBICIN (dox oh ROO bi sin) treats some types of cancer. It works by slowing down the growth of cancer cells. This medicine may be used for other purposes; ask your health care provider or pharmacist if you have questions. COMMON BRAND NAME(S): Adriamycin, Adriamycin PFS, Adriamycin RDF, Rubex What should I tell my care team before I take this medication? They need to know if you have any of these conditions: Heart disease History of low blood cell levels caused by a medication Liver  disease Recent or ongoing radiation An unusual or allergic reaction to doxorubicin, other medications, foods, dyes, or preservatives If you or your partner are pregnant or trying to get pregnant Breast-feeding How should I use this medication? This medication is injected into a vein. It is given by your care team in a hospital or clinic  setting. Talk to your care team about the use of this medication in children. Special care may be needed. Overdosage: If you think you have taken too much of this medicine contact a poison control center or emergency room at once. NOTE: This medicine is only for you. Do not share this medicine with others. What if I miss a dose? Keep appointments for follow-up doses. It is important not to miss your dose. Call your care team if you are unable to keep an appointment. What may interact with this medication? 6-mercaptopurine Paclitaxel Phenytoin St. John's wort Trastuzumab Verapamil This list may not describe all possible interactions. Give your health care provider a list of all the medicines, herbs, non-prescription drugs, or dietary supplements you use. Also tell them if you smoke, drink alcohol, or use illegal drugs. Some items may interact with your medicine. What should I watch for while using this medication? Your condition will be monitored carefully while you are receiving this medication. You may need blood work while taking this medication. This medication may make you feel generally unwell. This is not uncommon as chemotherapy can affect healthy cells as well as cancer cells. Report any side effects. Continue your course of treatment even though you feel ill unless your care team tells you to stop. There is a maximum amount of this medication you should receive throughout your life. The amount depends on the medical condition being treated and your overall health. Your care team will watch how much of this medication you receive. Tell your care team if you have taken this medication before. Your urine may turn red for a few days after your dose. This is not blood. If your urine is dark or brown, call your care team. In some cases, you may be given additional medications to help with side effects. Follow all directions for their use. This medication may increase your risk of getting an  infection. Call your care team for advice if you get a fever, chills, sore throat, or other symptoms of a cold or flu. Do not treat yourself. Try to avoid being around people who are sick. This medication may increase your risk to bruise or bleed. Call your care team if you notice any unusual bleeding. Talk to your care team about your risk of cancer. You may be more at risk for certain types of cancers if you take this medication. Talk to your care team if you or your partner may be pregnant. Serious birth defects can occur if you take this medication during pregnancy and for 6 months after the last dose. Contraception is recommended while taking this medication and for 6 months after the last dose. Your care team can help you find the option that works for you. If your partner can get pregnant, use a condom while taking this medication and for 6 months after the last dose. Do not breastfeed while taking this medication. This medication may cause infertility. Talk to your care team if you are concerned about your fertility. What side effects may I notice from receiving this medication? Side effects that you should report to your care team as soon as possible: Allergic reactions--skin rash,  itching, hives, swelling of the face, lips, tongue, or throat Heart failure--shortness of breath, swelling of the ankles, feet, or hands, sudden weight gain, unusual weakness or fatigue Heart rhythm changes--fast or irregular heartbeat, dizziness, feeling faint or lightheaded, chest pain, trouble breathing Infection--fever, chills, cough, sore throat, wounds that don't heal, pain or trouble when passing urine, general feeling of discomfort or being unwell Low red blood cell level--unusual weakness or fatigue, dizziness, headache, trouble breathing Painful swelling, warmth, or redness of the skin, blisters or sores at the infusion site Unusual bruising or bleeding Side effects that usually do not require medical  attention (report to your care team if they continue or are bothersome): Diarrhea Hair loss Nausea Pain, redness, or swelling with sores inside the mouth or throat Red urine This list may not describe all possible side effects. Call your doctor for medical advice about side effects. You may report side effects to FDA at 1-800-FDA-1088. Where should I keep my medication? This medication is given in a hospital or clinic. It will not be stored at home. NOTE: This sheet is a summary. It may not cover all possible information. If you have questions about this medicine, talk to your doctor, pharmacist, or health care provider.  2024 Elsevier/Gold Standard (2022-04-30 00:00:00) Potassium Chloride Injection What is this medication? POTASSIUM CHLORIDE (poe TASS i um KLOOR ide) prevents and treats low levels of potassium in your body. Potassium plays an important role in maintaining the health of your kidneys, heart, muscles, and nervous system. This medicine may be used for other purposes; ask your health care provider or pharmacist if you have questions. COMMON BRAND NAME(S): PROAMP What should I tell my care team before I take this medication? They need to know if you have any of these conditions: Addison disease Dehydration Diabetes (high blood sugar) Heart disease High levels of potassium in the blood Irregular heartbeat or rhythm Kidney disease Large areas of burned skin An unusual or allergic reaction to potassium, other medications, foods, dyes, or preservatives Pregnant or trying to get pregnant Breast-feeding How should I use this medication? This medication is injected into a vein. It is given in a hospital or clinic setting. Talk to your care team about the use of this medication in children. Special care may be needed. Overdosage: If you think you have taken too much of this medicine contact a poison control center or emergency room at once. NOTE: This medicine is only for you.  Do not share this medicine with others. What if I miss a dose? This does not apply. This medication is not for regular use. What may interact with this medication? Do not take this medication with any of the following: Certain diuretics, such as spironolactone, triamterene Eplerenone Sodium polystyrene sulfonate This medication may also interact with the following: Certain medications for blood pressure or heart disease, such as lisinopril, losartan, quinapril, valsartan Medications that lower your chance of fighting infection, such as cyclosporine, tacrolimus NSAIDs, medications for pain and inflammation, such as ibuprofen or naproxen Other potassium supplements Salt substitutes This list may not describe all possible interactions. Give your health care provider a list of all the medicines, herbs, non-prescription drugs, or dietary supplements you use. Also tell them if you smoke, drink alcohol, or use illegal drugs. Some items may interact with your medicine. What should I watch for while using this medication? Visit your care team for regular checks on your progress. Tell your care team if your symptoms do not start to get  better or if they get worse. You may need blood work while you are taking this medication. Avoid salt substitutes unless you are told otherwise by your care team. What side effects may I notice from receiving this medication? Side effects that you should report to your care team as soon as possible: Allergic reactions--skin rash, itching, hives, swelling of the face, lips, tongue, or throat High potassium level--muscle weakness, fast or irregular heartbeat Side effects that usually do not require medical attention (report to your care team if they continue or are bothersome): Diarrhea Nausea Stomach pain Vomiting This list may not describe all possible side effects. Call your doctor for medical advice about side effects. You may report side effects to FDA at  1-800-FDA-1088. Where should I keep my medication? This medication is given in a hospital or clinic. It will not be stored at home. NOTE: This sheet is a summary. It may not cover all possible information. If you have questions about this medicine, talk to your doctor, pharmacist, or health care provider.  2024 Elsevier/Gold Standard (2021-08-08 00:00:00)

## 2023-04-19 NOTE — Telephone Encounter (Addendum)
 Dr. Mosetta Putt received/reviewed critical lab results as noted below. IV potassium ordered to be given during infusion encounter.  Received request from patient for refill of PO Potassium via infusion RN.    Per Dr. Mosetta Putt - send prescription for PO Potassium, 10 meq, TID and return for labs later this week. RX sent to [facility] per patient request. Lab appt scheduled for 04/22/23 at 0900

## 2023-04-19 NOTE — Telephone Encounter (Signed)
 CRITICAL VALUE STICKER  CRITICAL VALUE: K+  2.3  RECEIVER (on-site recipient of call):Sandi, RN  DATE & TIME NOTIFIED: 04/19/23; 2841  MESSENGER (representative from lab):Mindi Junker  MD NOTIFIED: Dr. Mosetta Putt  TIME OF NOTIFICATION:0938  RESPONSE: Info acknowledged

## 2023-04-19 NOTE — Telephone Encounter (Signed)
 Sarah Wu from [facility] requesting refill on Eliquis. The refill that they process in Feb was the last one from the previous prescription and that mail order got lost so they are looking for new prescription for Eliquis  Indication: PAF 5 mg twice daily dose is appropriate. Prescription sent for 1 month with 5 refills

## 2023-04-19 NOTE — Progress Notes (Signed)
 Pt potassium 2.3 today. Per Mosetta Putt MD, give 2 runs in clinic today and increase oral potassium intake to 3xday. Pt expressed understanding of change in oral potassium prescription.

## 2023-04-19 NOTE — Addendum Note (Signed)
 Addended by: Tonny Bollman on: 04/19/2023 01:25 PM   Modules accepted: Orders

## 2023-04-19 NOTE — Telephone Encounter (Signed)
*  STAT* If patient is at the pharmacy, call can be transferred to refill team.   1. Which medications need to be refilled? (please list name of each medication and dose if known)   apixaban (ELIQUIS) 5 MG TABS tablet   2. Would you like to learn more about the convenience, safety, & potential cost savings by using the [facility]?   3. Are you open to using the [facility] (Type [facility]. ).   4. Which pharmacy/location (including street and city if local pharmacy) is medication to be sent to?  [facility] - [place], [place] - [address] [facility] AT Portal to Reg[facility] Sites   5. Do they need a 30 day or 90 day supply?   45 day  Caller Misty Stanley) stated patient's medication was lost in transit and they will need a prescription to have medication re-sent.

## 2023-04-20 ENCOUNTER — Telehealth: Payer: Self-pay | Admitting: *Deleted

## 2023-04-20 NOTE — Telephone Encounter (Signed)
 Called pt to see how she did with her treatment yest.  She reports doing well & denies any problems.  She knows her next appts & how to reach [place] if needed.

## 2023-04-20 NOTE — Telephone Encounter (Signed)
-----   Message from Nurse Threasa Beards sent at 04/19/2023  2:05 PM EDT ----- Regarding: Dr Bertis Ruddy pt, first time Doxorubicin Dr Bertis Ruddy pt came in 04/19/23 for first time Doxorubicin. Tolerated infusion well. Needs call back.   Supposed to come back for labs on 3/13 to recheck low potassium.

## 2023-04-22 ENCOUNTER — Telehealth: Payer: Self-pay

## 2023-04-22 ENCOUNTER — Other Ambulatory Visit

## 2023-04-22 ENCOUNTER — Other Ambulatory Visit: Payer: Self-pay | Admitting: Hematology and Oncology

## 2023-04-22 ENCOUNTER — Inpatient Hospital Stay

## 2023-04-22 ENCOUNTER — Encounter: Payer: Self-pay | Admitting: Hematology and Oncology

## 2023-04-22 ENCOUNTER — Other Ambulatory Visit: Payer: Self-pay

## 2023-04-22 DIAGNOSIS — Z5112 Encounter for antineoplastic immunotherapy: Secondary | ICD-10-CM | POA: Diagnosis not present

## 2023-04-22 DIAGNOSIS — C549 Malignant neoplasm of corpus uteri, unspecified: Secondary | ICD-10-CM

## 2023-04-22 DIAGNOSIS — E876 Hypokalemia: Secondary | ICD-10-CM

## 2023-04-22 LAB — COMPREHENSIVE METABOLIC PANEL
ALT: 21 U/L (ref 0–44)
AST: 30 U/L (ref 15–41)
Albumin: 3.4 g/dL — ABNORMAL LOW (ref 3.5–5.0)
Alkaline Phosphatase: 47 U/L (ref 38–126)
Anion gap: 8 (ref 5–15)
BUN: 22 mg/dL (ref 8–23)
CO2: 34 mmol/L — ABNORMAL HIGH (ref 22–32)
Calcium: 8.3 mg/dL — ABNORMAL LOW (ref 8.9–10.3)
Chloride: 96 mmol/L — ABNORMAL LOW (ref 98–111)
Creatinine, Ser: 0.75 mg/dL (ref 0.44–1.00)
GFR, Estimated: >60 mL/min (ref >60)
Glucose, Bld: 153 mg/dL — ABNORMAL HIGH (ref 70–99)
Potassium: 3.2 mmol/L — ABNORMAL LOW (ref 3.5–5.1)
Sodium: 138 mmol/L (ref 135–145)
Total Bilirubin: 0.7 mg/dL (ref 0.0–1.2)
Total Protein: 6.9 g/dL (ref 6.5–8.1)

## 2023-04-22 LAB — CBC WITH DIFFERENTIAL/PLATELET
Abs Immature Granulocytes: 0.03 10*3/uL (ref 0.00–0.07)
Basophils Absolute: 0 10*3/uL (ref 0.0–0.1)
Basophils Relative: 0 %
Eosinophils Absolute: 0 10*3/uL (ref 0.0–0.5)
Eosinophils Relative: 0 %
HCT: 26.1 % — ABNORMAL LOW (ref 36.0–46.0)
Hemoglobin: 8.1 g/dL — ABNORMAL LOW (ref 12.0–15.0)
Immature Granulocytes: 1 %
Lymphocytes Relative: 7 %
Lymphs Abs: 0.5 10*3/uL — ABNORMAL LOW (ref 0.7–4.0)
MCH: 28.9 pg (ref 26.0–34.0)
MCHC: 31 g/dL (ref 30.0–36.0)
MCV: 93.2 fL (ref 80.0–100.0)
Monocytes Absolute: 0 10*3/uL — ABNORMAL LOW (ref 0.1–1.0)
Monocytes Relative: 1 %
Neutro Abs: 5.8 10*3/uL (ref 1.7–7.7)
Neutrophils Relative %: 91 %
Platelets: 288 10*3/uL (ref 150–400)
RBC: 2.8 MIL/uL — ABNORMAL LOW (ref 3.87–5.11)
RDW: 19.1 % — ABNORMAL HIGH (ref 11.5–15.5)
WBC: 6.3 10*3/uL (ref 4.0–10.5)
nRBC: 0 % (ref 0.0–0.2)

## 2023-04-22 LAB — MAGNESIUM: Magnesium: 1.1 mg/dL — ABNORMAL LOW (ref 1.7–2.4)

## 2023-04-22 MED ORDER — MAGNESIUM OXIDE -MG SUPPLEMENT 400 (240 MG) MG PO TABS: 400.0000 mg | ORAL_TABLET | Freq: Every day | ORAL | 1 refills | Status: DC

## 2023-04-22 MED ORDER — HEPARIN SOD (PORK) LOCK FLUSH 100 UNIT/ML IV SOLN
500.0000 [IU] | Freq: Once | INTRAVENOUS | Status: AC
Start: 2023-04-22 — End: 2023-04-22
  Administered 2023-04-22: 500 [IU]

## 2023-04-22 MED ORDER — SODIUM CHLORIDE 0.9% FLUSH
10.0000 mL | Freq: Once | INTRAVENOUS | Status: AC
  Administered 2023-04-22: 10 mL

## 2023-04-22 NOTE — Telephone Encounter (Signed)
 Called her Dr. Bertis Ruddy and spoke with daughter. Instructed to stop potassium tabs and start eating high potassium foods daily. Reviewed high potassium foods list and daughter will have Maelle eat hight potassium foods. Sent magnesium oxide Rx to preferred pharmacy. Loneta will start today.

## 2023-04-25 ENCOUNTER — Other Ambulatory Visit: Payer: Self-pay

## 2023-05-07 ENCOUNTER — Inpatient Hospital Stay
Admission: RE | Admit: 2023-05-07 | Discharge: 2023-05-07 | Disposition: A | Discharge status: Home / Self Care | Payer: Self-pay | Source: Ambulatory Visit | Attending: Hematology and Oncology | Admitting: Hematology and Oncology

## 2023-05-07 ENCOUNTER — Encounter: Payer: Self-pay | Admitting: Oncology

## 2023-05-07 ENCOUNTER — Encounter: Payer: Self-pay | Admitting: Hematology and Oncology

## 2023-05-07 ENCOUNTER — Inpatient Hospital Stay: Payer: Medicare HMO | Admitting: Hematology and Oncology

## 2023-05-07 DIAGNOSIS — Z79899 Other long term (current) drug therapy: Secondary | ICD-10-CM | POA: Diagnosis not present

## 2023-05-07 DIAGNOSIS — D61818 Other pancytopenia: Secondary | ICD-10-CM | POA: Diagnosis not present

## 2023-05-07 DIAGNOSIS — C55 Malignant neoplasm of uterus, part unspecified: Secondary | ICD-10-CM

## 2023-05-07 DIAGNOSIS — C541 Malignant neoplasm of endometrium: Secondary | ICD-10-CM | POA: Diagnosis not present

## 2023-05-07 DIAGNOSIS — Z5112 Encounter for antineoplastic immunotherapy: Secondary | ICD-10-CM | POA: Diagnosis not present

## 2023-05-07 DIAGNOSIS — C549 Malignant neoplasm of corpus uteri, unspecified: Secondary | ICD-10-CM

## 2023-05-07 MED FILL — Fosaprepitant Dimeglumine For IV Infusion 150 MG (Base Eq): INTRAVENOUS | Qty: 5 | Status: AC

## 2023-05-07 NOTE — Assessment & Plan Note (Signed)
 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

## 2023-05-07 NOTE — Assessment & Plan Note (Signed)
 We will recheck CBC next week I will decide based on her hemoglobin whether she will get blood on Wednesday next week when she returns for G-CSF support over the following week

## 2023-05-07 NOTE — Progress Notes (Signed)
 HEMATOLOGY-ONCOLOGY ELECTRONIC VISIT PROGRESS NOTE  Patient Care Team: [name], Algis Greenhouse, DO as PCP - General (Family Medicine)  I connected with the patient via telephone conference and verified that I am speaking with the correct person using two identifiers. The patient's location is at home and I am providing care from the [facility] I discussed the limitations, risks, security and privacy concerns of performing an evaluation and management service by e-visits and the availability of in person appointments.  I also discussed with the patient that there may be a patient responsible charge related to this service. The patient expressed understanding and agreed to proceed.   ASSESSMENT & PLAN:  Uterine cancer (HCC) 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 MSI stable, IHC proficient MMR, Her2/neu 2+, PR 10%, p53 mutated  Initially, she has good response to neoadjuvant chemotherapy with carboplatin, paclitaxel and dostarlimab leading to successful surgical debulking surgery Unfortunately, she has disease relapse in February 2025 with pulmonary metastasis, recurrent pelvic lymphadenopathy and ascites Progressed on carboplatin, taxol and Dostarlimab   She received cycle 1 of doxorubicin 3 weeks ago, complicated by recent hospitalization, neutropenic fever and severe pancytopenia requiring transfusion support and local hospital She continues to desire treatment I will arrange for dose reduction next week along with G-CSF support I will also arrange for blood transfusion support in the future Plan to repeat imaging study after cycle 3 of therapy  Pancytopenia, acquired (HCC) We will recheck CBC next week I will decide based on her hemoglobin whether she will get blood on Wednesday next week when she returns for G-CSF support over the following week  Orders Placed  This Encounter  Procedures   Sample to Blood Bank    Standing Status:   Standing    Number of Occurrences:   33    Expiration Date:   05/06/2024    INTERVAL HISTORY: Please see below for problem oriented charting. The purpose of today's discussion is discussed toxicity related to recent treatment and future follow-up She was hospitalized a week ago with neutropenic fever and severe anemia requiring transfusion support She felt better now She will complete her antibiotic this weekend Denies recent mouth sores or nausea We discussed dose reduction, G-CSF support as well as transfusion support in the future  SUMMARY OF ONCOLOGIC HISTORY: Oncology History Overview Note  Outside path showed carcinosarcoma, PD-L1 positive, MSI stable Progressed on carboplatin, taxol and Dostarlimab CARIS on sample 12/31/21 MSI stable, IHC proficient MMR, Her2/neu 2+, PR 10%, p53 mutated   Uterine cancer (HCC)  07/30/2021 Initial Diagnosis   She was evaluated for post-menopausal bleeding. She reported having been seen a year previously and having a biopsy that was negative.  On exam in clinic, she was noted to have a mass present within the vagina.  This was thought to be a prolapsing fibroid and plan was made for exam under anesthesia, removal prolapsing fibroid with hysteroscopy and D&C   08/11/2021 Pathology Results   PD-L1 [phone]):                                             Does meet FDA-approval, Combined Positive Score (CPS): >=1   Pan-TRK IHC:  Tumor cells are Focally Positive for TRK protein expression.   MICROSATELLITE INSTABILITY:             MS-Stable   TUMOR MUTATION BURDEN:                10.8 Muts/Mb TMB-Low   OTHER BIOMARKERS:                              MET amplification PIK3CA amplification MDM2 amplification CCNE1 amplification   Pertinent Negative Biomarkers Evaluated by NGS: ALK ATM BARD1 BRAF BRCA1 BRCA2 CDK12 CHECK1 CHEK2 EGFR  ERBB2  ESR1 FANCL FGFR2 FGFR3 IDH1 IDH2 KIT KRAS NRAS NTRK1 NTRK2 NTRK3 PALB2 PDGFRA RAD51B RAD51C  RAD51D RAD54L RET ROS1   Comment:  The above studies are performed and reported by [facility], [place]) as part of the [employer] (NGS) panel.  Pertinent results are summarized above.  Please see their separate reports for more detailed information  Addendum electronically signed by Dalbert Mayotte, MD on 09/03/2021 at  7:37 AM  Final Diagnosis    A.  CERVICAL MASS, EXCISION:       - CONSISTENT WITH CARCINOSARCOMA (HIGH GRADE MALIGNANT BIPHASIC TUMOR)  Electronically signed by Dalbert Mayotte, MD on 08/15/2021 at  9:53 AM  Comment    The malignant epithelial component exhibits high-grade cytomorphology and immunostaining pattern consistent with serous carcinoma to include strong positive diffuse reactivity for p53, p16, and vimentin, focal subset CK7 and CK AE1/AE3 expression, with high Ki-67 staining (approximately 60%).  Neuroendocrine differentiation is present (CD56 negative but synaptophysin positive).  The stromal component is composed of malignant mitotically active cells and heterologous elements with focal chondroid differentiation (S100 positive).  SMA appears to decorate rare tumor cells and highlights vascular structures.  The tumor cells are non-reactive for CK20, WT1, GATA3, p40 and desmin.  Block A5 is referred to [facility] [place], [place]) to perform molecular characterization.  These findings are shared with Dr. Jamie Kato on [id number] at [phone] hours by telephone conversation. This case received prospective intradepartmental quality assurance review.   Clinical Information    Cervical mass  Gross Description    A. Cervix Received fresh labeled with the patient's name and date of birth and "cervical mass frozen section" per container is an aggregate of red-tan friable soft tissue measuring approximately 5 x 5 x 3 cm in toto.  Representative sections are frozen for an intraoperative  consultation.  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   Results rendered by Dr. Gasper Lloyd and reported to Dr. Paralee Cancel at [facility] on 08/11/2021 at 8:06 AM.  Microscopic Description    Microscopic examination supports the above diagnosis.  Single antibody immunostains are performed to include: S100, p40, p16, p53, PAX8, CK7, CK20, vimentin, CK AE1/3, GATA3, WT1, desmin, SMA, synaptophysin, CD56, and Ki-67.  All controls are appropriately reactive. [id number], [id number], [id number], [id number], [id number], [id number] (15)          08/22/2021 Initial Diagnosis   Carcinosarcoma (HCC)   09/01/2021 Imaging   CT abdomen and pelvis There is marked enlargement of the uterus measuring 13.7 x 10 x 8 x 10.1 cm. There is inhomogeneous enhancement in the uterus consistent with malignant neoplasm.   There are enlarged lymph nodes in the  retroperitoneum in the para-and paracaval regions measuring up to 12 mm in short axis. There are a few enlarged lymph nodes adjacent to the iliac vessels largest measuring 2.6 x 1.4 cm adjacent to the right external iliac vessels. Findings suggest possible metastatic lymphadenopathy. There is mild prominence of the pelvocaliceal system in the left kidney and proximal left ureter. Possibility of extrinsic compression or infiltrative process related to the uterine neoplasm in the distal course of left ureter causing mild obstruction is not excluded.   Gallbladder stone.  Diverticulosis of colon.   Other findings as described in the body of the report.   09/01/2021 Imaging   US pelvis Large central uterine ill-defined mass approximately 7.7 cm diameter enlarging uterus likely representing the biopsy-proven neoplasm, likely of endometrial origin.   Questionable intramural  leiomyoma posterior mid uterus 3.1 cm diameter.   The mass extend and the adnexal regions are poorly characterized but may potentially be better evaluated by MR imaging with and without contrast.   09/10/2021 Surgery   Pre-operative Diagnosis: carcinosarcoma of the uterus, adenopathy on imaging   Post-operative Diagnosis: same, stage IVB carcinosarcoma of the uterus   Operation: Diagnostic laparoscopy    Surgeon: Eugene Garnet MD Operative Findings: On EUA, cervix 2 cm dilated with tumor noted within the os. Cervix otherwise normal appearing. Uterus enlarged, 12 cm, with limited mobility secondary to weight. Compression of the rectum from uterus although no direct invasion appreciated. On intra-abdominal inspection, adhesions noted between the liver and the anterior abdominal wall. Evidence of carcinomatosis involving liver surface, diaphragm, omentum, anterior abdominal wall. Surface of the uterus with obvious tumor infiltration. Small bowel free from uterus. Posteriorly, sigmoid densely adherent to the posterior uterus. Even with manipulator, unable to move the uterus anteriorly. Minimal ascites.    Given findings of stage IV disease and what would require large bowel resection and end ostomy, decision made to abort surgery in favor of neoadjuvant chemotherapy. I had discussed this as a distinct possibility when we had gotten her CT results as well as this morning prior to the surgery.    09/15/2021 Cancer Staging   Staging form: Corpus Uteri - Carcinoma and Carcinosarcoma, AJCC 8th Edition - Clinical stage from 09/15/2021: FIGO Stage IVB (cT3, cN2a, pM1) - Signed by Artis Delay, MD on 09/15/2021 Stage prefix: Initial diagnosis   09/24/2021 - 03/12/2022 Chemotherapy   Patient is on Treatment Plan : UTERINE ENDOMETRIAL Dostarlimab-gxly (500 mg) + Carboplatin (AUC 5) + Paclitaxel (175 mg/m2) q21d x 6 cycles / Dostarlimab-gxly (1000 mg) q42d x 6 cycles      10/17/2021 Procedure   Status post revision  of nonfunctional right IJ port catheter, with new right IJ port catheter placed. Port is ready for use.     11/24/2021 Imaging   1. Today's study demonstrates a positive response to therapy with partial involution of large malignant appearing uterine mass. 2. Multiple prominent borderline enlarged and mildly enlarged retroperitoneal and pelvic lymph nodes are stable to minimally decreased in size compared to the prior study, potentially metastatic. No new lymphadenopathy is noted elsewhere in the chest, abdomen or pelvis. 3. Multiple small pulmonary nodules measuring 5 mm or less in the lungs. These were incidentally imaged on the prior CT of the abdomen and pelvis 09/01/2021, and at this time are stable. The possibility of metastatic disease is not excluded, and close attention on follow-up studies is recommended. 4. Colonic diverticulosis without evidence of acute diverticulitis at this time. 5. Additional incidental findings, as above.  12/31/2021 Surgery   Robotic-assisted laparoscopic total hysterectomy with bilateral salpingo-oophorectomy, lysis of adhesions for approximately 40 minutes, cystoscopy   Findings: On EUA, moderately mobile enlarged uterus.  On intra-abdominal entry, filmy adhesions between the anterior liver and the diaphragm bilaterally.  No carcinomatosis noted on the diaphragm or liver.  Normal-appearing stomach, small bowel and omentum.  No peritoneal lesions in the lower abdominal cavity or pelvis.  No ascites.  Uterus enlarged measuring approximately 12-14 cm, bulbous at the top.  Posterior 2 cm fibroid near the fundus.  Posterior aspect of the uterus densely adherent to the colon mesentery.  Somewhat dilated fluid-filled left fallopian tube.  Normal-appearing right adnexa.  Left ovary adherent to the broad ligament and sigmoid mesentery but otherwise normal-appearing.  Significant fibrosis bilaterally of the retroperitoneum.  No obvious adenopathy appreciated. On  cystoscopy, bladder dome intact, good efflux noted from bilateral ureteral orifices.   12/31/2021 Pathology Results   A. CULDESAC, POSTERIOR ADHESIONS, EXCISION: -  Serosa and subserosal fibrous soft tissue with focal acute inflammation, reactive mesothelial hyperplasia and adhesions. -  Negative for malignancy.  B. UTERUS, CERVIX, BILATERAL TUBE AND OVAARIES, HYSTERECTOMY: - -  Carcinosarcoma (malignant mixed Mullerian tumor) with full-thickness posterior myometrial involvement with focal serosal present and extension into lower uterine segment and extensively in the cervical stroma. 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 Not currently on therapy. * Tracking Code: BO * EXAM: CT CHEST, ABDOMEN, AND PELVIS WITH CONTRAST TECHNIQUE: Multidetector CT  imaging of the chest, abdomen and pelvis was performed following the standard protocol during bolus administration of intravenous contrast. RADIATION DOSE REDUCTION: This exam was performed according to the departmental dose-optimization program which includes automated exposure control, adjustment of the mA and/or kV according to patient size and/or use of iterative reconstruction technique. CONTRAST:  OMNIPAQUE IOHEXOL 300 MG/ML  SOLN COMPARISON:  Multiple priors including CT April 03, 2022 and November 21, 2021 FINDINGS: CT CHEST FINDINGS Cardiovascular: Accessed right chest Port-A-Cath with tip at the superior cavoatrial junction. Normal caliber thoracic aorta. No central pulmonary embolus on this nondedicated study. Normal size heart. No significant pericardial effusion/thickening. Mediastinum/Nodes: No suspicious thyroid nodule. No pathologically enlarged mediastinal, hilar or axillary lymph nodes. Patulous esophagus. Lungs/Pleura: Scattered bilateral pulmonary nodules are stable from prior examination. No new suspicious pulmonary nodules or masses. For reference: -pulmonary nodule in the medial right lower lobe measuring 5 mm on image 74/4 is unchanged. -3 mm right middle lobe pulmonary nodule on image 89/4 is unchanged. Musculoskeletal: No aggressive lytic or  blastic lesion of bone. Multilevel degenerative change of the spine. CT ABDOMEN PELVIS FINDINGS Hepatobiliary: No suspicious hepatic lesion. Gallbladder is unremarkable. No biliary ductal dilation. Pancreas: No pancreatic ductal dilation or evidence of acute inflammation. Spleen: No splenomegaly. Adrenals/Urinary Tract: Bilateral adrenal glands appear normal. No hydronephrosis. Kidneys demonstrate symmetric enhancement. Urinary bladder is unremarkable for degree of distension. Stomach/Bowel: Radiopaque enteric contrast material traverses the splenic flexure. Stomach is unremarkable for degree of distension. No pathologic dilation of small or  large bowel. No evidence of acute bowel inflammation. Vascular/Lymphatic: Normal caliber abdominal aorta. The portal, splenic and superior mesenteric veins are patent. New retroperitoneal, iliac side chain and pelvic sidewall adenopathy. For reference: -retrocaval right upper quadrant lymph node measures 10 mm in short axis on image 65/2 -aortocaval lymph node measures 18 mm in short axis on image 77/2 -left common iliac lymph node measures 2.3 cm in short axis on image 83/2 -left obturator lymph node measures 2.1 cm in short axis on image 94/2. Reproductive: Uterus is surgically absent with a new 2.6 x 2.2 cm nodule near the left vaginal cuff on image 98/2. Other: Mild mesenteric stranding similar prior. No discrete peritoneal or omental nodularity. Nonspecific subcutaneous edema. Musculoskeletal: Round sclerotic osseous lesion in the L1 vertebral body is unchanged. No new aggressive lytic or blastic lesion of bone. Multilevel degenerative changes spine. IMPRESSION: 1. New abdominopelvic adenopathy, compatible with recurrent nodal metastatic disease. 2. New 2.6 cm nodule near the left vaginal cuff, concerning for recurrent local disease versus and adjacent nodal metastasis. 3. Stable round sclerotic osseous lesion in the L1 vertebral body. 4. Scattered bilateral pulmonary nodules are stable from prior examination. No new suspicious pulmonary nodules or masses. These results will be called to the ordering clinician or representative by the Radiologist Assistant, and communication documented in the PACS or [employer]. Electronically Signed   By: Maudry Mayhew M.D.   On: 10/22/2022 11:37      11/09/2022 - 03/15/2023 Chemotherapy   Patient is on Treatment Plan : UTERINE ENDOMETRIAL Dostarlimab-gxly (500 mg) + Carboplatin (AUC 5) + Paclitaxel (175 mg/m2) q21d x 6 cycles / Dostarlimab-gxly (1000 mg) q42d x 6 cycles      01/04/2023 Imaging   CT CHEST ABDOMEN PELVIS W CONTRAST  Result Date:  01/04/2023 CLINICAL DATA:  High-grade tumor assessment endometrial cancer. Assess response to the chemotherapy. * Tracking Code: BO * EXAM: CT CHEST, ABDOMEN, AND PELVIS WITH CONTRAST TECHNIQUE: Multidetector CT imaging of the chest, abdomen and pelvis was performed following the standard protocol during bolus administration of intravenous contrast. RADIATION DOSE REDUCTION: This exam was performed according to the departmental dose-optimization program which includes automated exposure control, adjustment of the mA and/or kV according to patient size and/or use of iterative reconstruction technique. CONTRAST:  OMNIPAQUE IOHEXOL 300 MG/ML  SOLN COMPARISON:  CT 10/22/2022. FINDINGS: CT CHEST FINDINGS Cardiovascular: Right upper chest port is accessed. Tip seen as far as the central SVC. Heart is nonenlarged. No pericardial effusion. Mild atherosclerotic changes along the aorta. Separate origin of the left vertebral artery directly from the aortic arch, normal variant. Mediastinum/Nodes: Preserved thyroid gland. Normal caliber thoracic esophagus. No specific abnormal lymph node enlargement identified in the axillary regions, hilum or mediastinum. Small nodes identified including pre cardiac are stable and not pathologic by size criteria. Lungs/Pleura: No consolidation, pneumothorax or effusion. Stable tiny nodules identified. These include 5 mm nodule left upper lobe series 6, image 40, 3 mm nodule middle lobe series 6, image 97, medial right lower  lobe nodule series 6, image 80 measuring 5 mm. No new lung nodularity clearly identified. There is also some nodular which are calcified such as left lower lobe superior segment series 6, image 61. Dependent areas of atelectasis and scarring. Musculoskeletal: Curvature of the spine with some degenerative changes. CT ABDOMEN PELVIS FINDINGS Hepatobiliary: Stones in the gallbladder. Gallbladder is nondilated. Patent portal vein. No space-occupying liver lesion.  Pancreas: Unremarkable. No pancreatic ductal dilatation or surrounding inflammatory changes. Spleen: Normal in size without focal abnormality. Adrenals/Urinary Tract: Adrenal glands are preserved. No enhancing renal mass or collecting system dilatation. The ureters have normal course and caliber extending down to the bladder. Slight wall thickening of the urinary bladder, new from previous. There is increasing adjacent stranding as well in the pelvis including extending pelvic sidewall and presacral spaces. Stomach/Bowel: Oral contrast was administered. The stomach is mildly distended with a air and debris. Small bowel is nondilated. Large bowel has a normal course and caliber with moderate stool. Vascular/Lymphatic: Normal caliber aorta and IVC with scattered vascular calcifications. Several abnormal lymph nodes are once again identified along the retroperitoneum and pelvis. Specific lesions will follow up for continuity. These include left common iliac chain node which has a short axis previously of 2.3 cm and today 1.8 cm on series 2, image 85. Retrocaval node which previously had a short axis of 10 mm, today on series 2, image 67 11 mm. Aortocaval node short axis of 18 mm and today when measured in a similar fashion 14 mm on series 2, image 79. Left obturator node short axis of 21 mm on the prior and today series 2, image 96 of 12 mm. No new nodal enlargement. Increasing retroperitoneal stranding. Reproductive: Absence of the uterus. Soft tissue node in the surgical bed which previously measured 2.6 x 2.2 cm, today is smaller on series 2, image 99 at 2.1 by 1.0 cm. Other: Small umbilical fat containing hernia. Musculoskeletal: Curvature and degenerative changes along the spine. IMPRESSION: Multifocal abnormal lymph node enlargement in the abdomen and pelvis. Many of these smaller. There is increasing adjacent stranding. Decreasing soft tissue nodule in the surgical bed in the low pelvis. No developing new mass  lesion, fluid collection or lymph node enlargement. Stable small lung nodules. Electronically Signed   By: Karen Kays M.D.   On: 01/04/2023 11:43      03/29/2023 Imaging   CT CHEST ABDOMEN PELVIS W CONTRAST Result Date: 03/29/2023 CLINICAL DATA:  Endometrial cancer, monitor, high-risk. * Tracking Code: BO * EXAM: CT CHEST, ABDOMEN, AND PELVIS WITH CONTRAST TECHNIQUE: Multidetector CT imaging of the chest, abdomen and pelvis was performed following the standard protocol during bolus administration of intravenous contrast. RADIATION DOSE REDUCTION: This exam was performed according to the departmental dose-optimization program which includes automated exposure control, adjustment of the mA and/or kV according to patient size and/or use of iterative reconstruction technique. CONTRAST:  OMNIPAQUE IOHEXOL 300 MG/ML  SOLN COMPARISON:  Multiple priors including most recent CT January 04, 2023. FINDINGS: CT CHEST FINDINGS Cardiovascular: Accessed right chest Port-A-Cath with tip near the superior cavoatrial junction. Normal caliber thoracic aorta. Normal size heart. No significant pericardial effusion/thickening. Mediastinum/Nodes: No suspicious thyroid nodule. No pathologically enlarged mediastinal, hilar or axillary lymph nodes. The esophagus is grossly unremarkable. Lungs/Pleura: New subsolid 6 mm subpleural right lower lobe pulmonary nodule on image 95/6. Other scattered tiny pulmonary nodules are stable.  For reference: -left upper lobe pulmonary nodule measures 5 mm on image 39/6, unchanged. -right middle lobe pulmonary  nodule measures 3 mm on image 95/6. Musculoskeletal: No aggressive lytic or blastic lesion of bone. Multilevel degenerative change of the spine. CT ABDOMEN PELVIS FINDINGS Hepatobiliary: No suspicious hepatic lesion. Cholelithiasis without findings of acute cholecystitis. No biliary ductal dilation. Pancreas: No pancreatic ductal dilation or evidence of acute inflammation. Spleen: No  splenomegaly. Adrenals/Urinary Tract: No suspicious adrenal nodule/mass. No hydronephrosis. Kidneys demonstrate symmetric enhancement. Symmetric wall thickening with urothelial hyperenhancement of the urinary bladder. Stomach/Bowel: No evidence of bowel obstruction or acute bowel inflammation. Scattered colonic diverticulosis. Vascular/Lymphatic: Aortic atherosclerosis. Portal, splenic and superior mesenteric veins are patent. Overall increased size of the abdominopelvic lymph nodes. For reference: -left common iliac lymph node measures 19 mm on image 84/2 previously 18 mm -retrocaval lymph node measures 2 cm on image 67/2 previously 11 mm. -aortocaval lymph node measures 15 mm in short axis on image 75/2 previously 14 mm. -left obturator lymph node measures 12 mm in short axis on image 97/2, unchanged. Reproductive: Increased size of the soft tissue nodule in the surgical bed now measuring 17 x 17 mm on image 100/2 previously 2.1 x 1.0 cm Other: Increased abdominopelvic free fluid. Small fat and fluid containing ventral/umbilical hernia. Nonspecific subcutaneous edema. Musculoskeletal: No aggressive lytic or blastic lesion of bone. Decreased density in the sacrum commonly reflects postradiation change. IMPRESSION: 1. Increased size of the soft tissue nodule in the surgical bed now measuring 17 x 17 mm. 2. Overall increased size of the abdominopelvic lymph nodes. 3. New subsolid 6 mm subpleural right lower lobe pulmonary nodule, nonspecific but favored to reflect an infectious or inflammatory etiology. However this warrants attention on short-term interval follow-up imaging. Other small pulmonary nodules are stable. 4. 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 5. Increased abdominopelvic free fluid. 6.  Aortic Atherosclerosis (ICD10-I70.0). Electronically Signed   By: Maudry Mayhew M.D.   On: 03/29/2023 11:26      04/16/2023  Echocardiogram    1. Left ventricular ejection fraction, by estimation, is 60 to 65%. The left ventricle has normal function. The left ventricle has no regional wall motion abnormalities. Left ventricular diastolic parameters are consistent with Grade I diastolic  dysfunction (impaired relaxation).  2. Right ventricular systolic function is normal. The right ventricular size is normal.  3. The mitral valve is normal in structure. No evidence of mitral valve regurgitation. No evidence of mitral stenosis.  4. The aortic valve is normal in structure. Aortic valve regurgitation is mild. No aortic stenosis is present.  5. The inferior vena cava is normal in size with greater than 50% respiratory variability, suggesting right atrial pressure of 3 mmHg.   04/19/2023 -  Chemotherapy   Patient is on Treatment Plan : BREAST Doxorubicin (60) q21d     04/29/2023 Imaging   1.  Cholelithiasis without evidence of acute cholecystitis.  2.  Fluid in nondistended loops of small bowel and colon, suggesting diarrhea.  3.  Thick-walled urinary bladder may be inflammatory or related to treatment.  4.  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      I discussed the assessment and treatment plan with the patient. The patient was provided an opportunity to ask questions and all were answered. The patient agreed with the plan and demonstrated an understanding of the instructions. The patient was advised to call back or seek an in-person evaluation if the symptoms worsen or if the condition fails to improve as anticipated.  I spent 40 minutes for the appointment reviewing test results, discuss management and coordination of care.  Artis Delay, MD 05/07/2023 9:41 AM

## 2023-05-07 NOTE — Progress Notes (Signed)
 Outside order placed to powershare CT abdomen/pelvis done at [facility] on 04/29/2023.

## 2023-05-09 ENCOUNTER — Other Ambulatory Visit: Payer: Self-pay

## 2023-05-10 ENCOUNTER — Other Ambulatory Visit (HOSPITAL_COMMUNITY): Payer: Medicare HMO

## 2023-05-10 ENCOUNTER — Inpatient Hospital Stay: Payer: Medicare HMO

## 2023-05-10 VITALS — BP 108/74 | HR 71 | Temp 98.6°F | Resp 20 | Wt 155.6 lb

## 2023-05-10 DIAGNOSIS — C549 Malignant neoplasm of corpus uteri, unspecified: Secondary | ICD-10-CM

## 2023-05-10 DIAGNOSIS — Z5112 Encounter for antineoplastic immunotherapy: Secondary | ICD-10-CM | POA: Diagnosis not present

## 2023-05-10 LAB — CBC WITH DIFFERENTIAL (CANCER CENTER ONLY)
Abs Immature Granulocytes: 0.1 10*3/uL — ABNORMAL HIGH (ref 0.00–0.07)
Basophils Absolute: 0 10*3/uL (ref 0.0–0.1)
Basophils Relative: 1 %
Eosinophils Absolute: 0 10*3/uL (ref 0.0–0.5)
Eosinophils Relative: 0 %
HCT: 31.6 % — ABNORMAL LOW (ref 36.0–46.0)
Hemoglobin: 9.9 g/dL — ABNORMAL LOW (ref 12.0–15.0)
Immature Granulocytes: 1 %
Lymphocytes Relative: 14 %
Lymphs Abs: 1 10*3/uL (ref 0.7–4.0)
MCH: 29.1 pg (ref 26.0–34.0)
MCHC: 31.3 g/dL (ref 30.0–36.0)
MCV: 92.9 fL (ref 80.0–100.0)
Monocytes Absolute: 1 10*3/uL (ref 0.1–1.0)
Monocytes Relative: 13 %
Neutro Abs: 5.4 10*3/uL (ref 1.7–7.7)
Neutrophils Relative %: 71 %
Platelet Count: 440 10*3/uL — ABNORMAL HIGH (ref 150–400)
RBC: 3.4 MIL/uL — ABNORMAL LOW (ref 3.87–5.11)
RDW: 17.9 % — ABNORMAL HIGH (ref 11.5–15.5)
WBC Count: 7.5 10*3/uL (ref 4.0–10.5)
nRBC: 0 % (ref 0.0–0.2)

## 2023-05-10 LAB — SAMPLE TO BLOOD BANK

## 2023-05-10 LAB — CMP (CANCER CENTER ONLY)
ALT: 10 U/L (ref 0–44)
AST: 24 U/L (ref 15–41)
Albumin: 3.4 g/dL — ABNORMAL LOW (ref 3.5–5.0)
Alkaline Phosphatase: 52 U/L (ref 38–126)
Anion gap: 9 (ref 5–15)
BUN: 7 mg/dL — ABNORMAL LOW (ref 8–23)
CO2: 31 mmol/L (ref 22–32)
Calcium: 8.8 mg/dL — ABNORMAL LOW (ref 8.9–10.3)
Chloride: 98 mmol/L (ref 98–111)
Creatinine: 0.77 mg/dL (ref 0.44–1.00)
GFR, Estimated: >60 mL/min (ref >60)
Glucose, Bld: 94 mg/dL (ref 70–99)
Potassium: 3.4 mmol/L — ABNORMAL LOW (ref 3.5–5.1)
Sodium: 138 mmol/L (ref 135–145)
Total Bilirubin: 0.9 mg/dL (ref 0.0–1.2)
Total Protein: 6.5 g/dL (ref 6.5–8.1)

## 2023-05-10 MED ORDER — HEPARIN SOD (PORK) LOCK FLUSH 100 UNIT/ML IV SOLN
500.0000 [IU] | Freq: Once | INTRAVENOUS | Status: AC | PRN
  Administered 2023-05-10: 500 [IU]

## 2023-05-10 MED ORDER — PALONOSETRON HCL INJECTION 0.25 MG/5ML
0.2500 mg | Freq: Once | INTRAVENOUS | Status: AC
  Administered 2023-05-10: 0.25 mg via INTRAVENOUS
  Filled 2023-05-10: qty 5

## 2023-05-10 MED ORDER — DEXAMETHASONE SODIUM PHOSPHATE 10 MG/ML IJ SOLN
10.0000 mg | Freq: Once | INTRAMUSCULAR | Status: AC
  Administered 2023-05-10: 10 mg via INTRAVENOUS
  Filled 2023-05-10: qty 1

## 2023-05-10 MED ORDER — DOXORUBICIN HCL CHEMO IV INJECTION 2 MG/ML
45.0000 mg/m2 | Freq: Once | INTRAVENOUS | Status: AC
  Administered 2023-05-10: 86 mg via INTRAVENOUS
  Filled 2023-05-10: qty 43

## 2023-05-10 MED ORDER — SODIUM CHLORIDE 0.9 % IV SOLN
150.0000 mg | Freq: Once | INTRAVENOUS | Status: AC
  Administered 2023-05-10: 150 mg via INTRAVENOUS
  Filled 2023-05-10: qty 150

## 2023-05-10 MED ORDER — SODIUM CHLORIDE 0.9% FLUSH
10.0000 mL | Freq: Once | INTRAVENOUS | Status: AC
  Administered 2023-05-10: 10 mL

## 2023-05-10 MED ORDER — SODIUM CHLORIDE 0.9% FLUSH
10.0000 mL | INTRAVENOUS | Status: DC | PRN
  Administered 2023-05-10: 10 mL

## 2023-05-10 MED ORDER — SODIUM CHLORIDE 0.9 % IV SOLN: INTRAVENOUS | Status: DC

## 2023-05-10 NOTE — Progress Notes (Signed)
 Patient tolerated Adriamycin, blood return noted before, during and after administration. Oral cryotherapy offered/used.

## 2023-05-10 NOTE — Patient Instructions (Signed)
 [facility] - A DEPT OF [facility]  Discharge Instructions: Thank you for choosing [facility] to provide your oncology and hematology care.   If you have a lab appointment with the Cancer Center, please go directly to the Cancer Center and check in at the registration area.   Wear comfortable clothing and clothing appropriate for easy access to any Portacath or PICC line.   We strive to give you quality time with your provider. You may need to reschedule your appointment if you arrive late (15 or more minutes).  Arriving late affects you and other patients whose appointments are after yours.  Also, if you miss three or more appointments without notifying the office, you may be dismissed from the clinic at the provider's discretion.      For prescription refill requests, have your pharmacy contact our office and allow 72 hours for refills to be completed.    Today you received the following chemotherapy and/or immunotherapy agents: DOXOrubicin (ADRIAMYCIN)   To help prevent nausea and vomiting after your treatment, we encourage you to take your nausea medication as directed.  BELOW ARE SYMPTOMS THAT SHOULD BE REPORTED IMMEDIATELY: *FEVER GREATER THAN 100.4 F (38 C) OR HIGHER *CHILLS OR SWEATING *NAUSEA AND VOMITING THAT IS NOT CONTROLLED WITH YOUR NAUSEA MEDICATION *UNUSUAL SHORTNESS OF BREATH *UNUSUAL BRUISING OR BLEEDING *URINARY PROBLEMS (pain or burning when urinating, or frequent urination) *BOWEL PROBLEMS (unusual diarrhea, constipation, pain near the anus) TENDERNESS IN MOUTH AND THROAT WITH OR WITHOUT PRESENCE OF ULCERS (sore throat, sores in mouth, or a toothache) UNUSUAL RASH, SWELLING OR PAIN  UNUSUAL VAGINAL DISCHARGE OR ITCHING   Items with * indicate a potential emergency and should be followed up as soon as possible or go to the Emergency Department if any problems should occur.  Please show the CHEMOTHERAPY ALERT CARD or  IMMUNOTHERAPY ALERT CARD at check-in to the Emergency Department and triage nurse.  Should you have questions after your visit or need to cancel or reschedule your appointment, please contact [facility] - A DEPT OF [facility]  Dept: [phone]  and follow the prompts.  Office hours are 8:00 a.m. to 4:30 p.m. Monday - Friday. Please note that voicemails left after 4:00 p.m. may not be returned until the following business day.  We are closed weekends and major holidays. You have access to a nurse at all times for urgent questions. Please call the main number to the clinic Dept: [phone] and follow the prompts.   For any non-urgent questions, you may also contact your provider using MyChart. We now offer e-Visits for anyone 27 and older to request care online for non-urgent symptoms. For details visit [email or web address].   Also download the MyChart app! Go to the app store, search "MyChart", open the app, select [facility], and log in with your MyChart username and password.  Doxorubicin Injection What is this medication? DOXORUBICIN (dox oh ROO bi sin) treats some types of cancer. It works by slowing down the growth of cancer cells. This medicine may be used for other purposes; ask your health care provider or pharmacist if you have questions. COMMON BRAND NAME(S): Adriamycin, Adriamycin PFS, Adriamycin RDF, Rubex What should I tell my care team before I take this medication? They need to know if you have any of these conditions: Heart disease History of low blood cell levels caused by a medication Liver disease Recent or  ongoing radiation An unusual or allergic reaction to doxorubicin, other medications, foods, dyes, or preservatives If you or your partner are pregnant or trying to get pregnant Breast-feeding How should I use this medication? This medication is injected into a vein. It is given by your care team in a hospital or clinic  setting. Talk to your care team about the use of this medication in children. Special care may be needed. Overdosage: If you think you have taken too much of this medicine contact a poison control center or emergency room at once. NOTE: This medicine is only for you. Do not share this medicine with others. What if I miss a dose? Keep appointments for follow-up doses. It is important not to miss your dose. Call your care team if you are unable to keep an appointment. What may interact with this medication? 6-mercaptopurine Paclitaxel Phenytoin St. John's wort Trastuzumab Verapamil This list may not describe all possible interactions. Give your health care provider a list of all the medicines, herbs, non-prescription drugs, or dietary supplements you use. Also tell them if you smoke, drink alcohol, or use illegal drugs. Some items may interact with your medicine. What should I watch for while using this medication? Your condition will be monitored carefully while you are receiving this medication. You may need blood work while taking this medication. This medication may make you feel generally unwell. This is not uncommon as chemotherapy can affect healthy cells as well as cancer cells. Report any side effects. Continue your course of treatment even though you feel ill unless your care team tells you to stop. There is a maximum amount of this medication you should receive throughout your life. The amount depends on the medical condition being treated and your overall health. Your care team will watch how much of this medication you receive. Tell your care team if you have taken this medication before. Your urine may turn red for a few days after your dose. This is not blood. If your urine is dark or brown, call your care team. In some cases, you may be given additional medications to help with side effects. Follow all directions for their use. This medication may increase your risk of getting an  infection. Call your care team for advice if you get a fever, chills, sore throat, or other symptoms of a cold or flu. Do not treat yourself. Try to avoid being around people who are sick. This medication may increase your risk to bruise or bleed. Call your care team if you notice any unusual bleeding. Talk to your care team about your risk of cancer. You may be more at risk for certain types of cancers if you take this medication. Talk to your care team if you or your partner may be pregnant. Serious birth defects can occur if you take this medication during pregnancy and for 6 months after the last dose. Contraception is recommended while taking this medication and for 6 months after the last dose. Your care team can help you find the option that works for you. If your partner can get pregnant, use a condom while taking this medication and for 6 months after the last dose. Do not breastfeed while taking this medication. This medication may cause infertility. Talk to your care team if you are concerned about your fertility. What side effects may I notice from receiving this medication? Side effects that you should report to your care team as soon as possible: Allergic reactions--skin rash, itching, hives, swelling  of the face, lips, tongue, or throat Heart failure--shortness of breath, swelling of the ankles, feet, or hands, sudden weight gain, unusual weakness or fatigue Heart rhythm changes--fast or irregular heartbeat, dizziness, feeling faint or lightheaded, chest pain, trouble breathing Infection--fever, chills, cough, sore throat, wounds that don't heal, pain or trouble when passing urine, general feeling of discomfort or being unwell Low red blood cell level--unusual weakness or fatigue, dizziness, headache, trouble breathing Painful swelling, warmth, or redness of the skin, blisters or sores at the infusion site Unusual bruising or bleeding Side effects that usually do not require medical  attention (report to your care team if they continue or are bothersome): Diarrhea Hair loss Nausea Pain, redness, or swelling with sores inside the mouth or throat Red urine This list may not describe all possible side effects. Call your doctor for medical advice about side effects. You may report side effects to FDA at 1-800-FDA-1088. Where should I keep my medication? This medication is given in a hospital or clinic. It will not be stored at home. NOTE: This sheet is a summary. It may not cover all possible information. If you have questions about this medicine, talk to your doctor, pharmacist, or health care provider.  2024 Elsevier/Gold Standard (2022-04-30 00:00:00)

## 2023-05-11 ENCOUNTER — Encounter: Payer: Self-pay | Admitting: Hematology and Oncology

## 2023-05-12 ENCOUNTER — Inpatient Hospital Stay: Attending: Hematology and Oncology

## 2023-05-12 VITALS — BP 90/70 | HR 65 | Temp 98.2°F | Resp 14

## 2023-05-12 DIAGNOSIS — R188 Other ascites: Secondary | ICD-10-CM | POA: Diagnosis not present

## 2023-05-12 DIAGNOSIS — Z5112 Encounter for antineoplastic immunotherapy: Secondary | ICD-10-CM | POA: Diagnosis present

## 2023-05-12 DIAGNOSIS — Z79899 Other long term (current) drug therapy: Secondary | ICD-10-CM | POA: Diagnosis not present

## 2023-05-12 DIAGNOSIS — C7800 Secondary malignant neoplasm of unspecified lung: Secondary | ICD-10-CM | POA: Insufficient documentation

## 2023-05-12 DIAGNOSIS — Z5111 Encounter for antineoplastic chemotherapy: Secondary | ICD-10-CM | POA: Diagnosis present

## 2023-05-12 DIAGNOSIS — R590 Localized enlarged lymph nodes: Secondary | ICD-10-CM | POA: Diagnosis not present

## 2023-05-12 DIAGNOSIS — C541 Malignant neoplasm of endometrium: Secondary | ICD-10-CM | POA: Insufficient documentation

## 2023-05-12 DIAGNOSIS — C549 Malignant neoplasm of corpus uteri, unspecified: Secondary | ICD-10-CM

## 2023-05-12 MED ORDER — PEGFILGRASTIM INJECTION 6 MG/0.6ML ~~LOC~~
6.0000 mg | PREFILLED_SYRINGE | Freq: Once | SUBCUTANEOUS | Status: AC
  Administered 2023-05-12: 6 mg via SUBCUTANEOUS
  Filled 2023-05-12: qty 0.6

## 2023-05-17 ENCOUNTER — Encounter: Payer: Self-pay | Admitting: Hematology and Oncology

## 2023-05-17 ENCOUNTER — Other Ambulatory Visit: Payer: Self-pay | Admitting: Hematology and Oncology

## 2023-05-17 ENCOUNTER — Telehealth: Payer: Self-pay

## 2023-05-17 MED ORDER — MIRTAZAPINE 15 MG PO TABS: 15.0000 mg | ORAL_TABLET | Freq: Every day | ORAL | 1 refills | Status: DC

## 2023-05-17 NOTE — Telephone Encounter (Signed)
 Called and given below message. Daughter verbalized understanding and will pick up Rx.

## 2023-05-17 NOTE — Telephone Encounter (Signed)
 Returned daughters call. Sarah Wu is complaining of lack of appetite for about 3 weeks. Her weight is down to 154 lbs. She is only eating breakfast and then refuses food at the other meals. She says she is not hungry and will no eat. Family is trying to give protein drinks but Sarah Wu will not drink them.  Daughter is asking for something to stimulate appetite. Or what does Dr. Bertis Wu suggest. They use [facility] pharmacy in [place].

## 2023-05-17 NOTE — Telephone Encounter (Signed)
 Every medication has side-effects It is possible she does not tolerate treatment well We can try remeron but in my experience it rarely works I will send prescription to local pharmacy, take at night

## 2023-05-20 ENCOUNTER — Encounter: Payer: Self-pay | Admitting: Hematology and Oncology

## 2023-05-20 ENCOUNTER — Telehealth: Payer: Self-pay

## 2023-05-20 ENCOUNTER — Other Ambulatory Visit: Payer: Self-pay

## 2023-05-20 NOTE — Telephone Encounter (Signed)
 Called daughter regarding mychart message complaint of sores in mouth. Instructed per Dr. Bertis Ruddy to gargle with warm salt water with baking soda.  Called [facility] multiple times to see if they can mix magic mouth wash. Will attempt to call the pharmacy again.

## 2023-05-21 ENCOUNTER — Encounter: Payer: Self-pay | Admitting: Hematology and Oncology

## 2023-05-21 ENCOUNTER — Other Ambulatory Visit: Payer: Self-pay

## 2023-05-21 MED ORDER — MAGIC MOUTHWASH W/LIDOCAINE: 5.0000 mL | Freq: Four times a day (QID) | ORAL | 0 refills | Status: DC

## 2023-05-21 NOTE — Telephone Encounter (Signed)
 Called daughter back. Per [facility] they do mix magic mouth wash. Rx would not escribe. Faxed Rx to [facility] in [place] at [phone], received a fax confirmation. Daughter will pick up magic mouth wash.

## 2023-05-24 ENCOUNTER — Other Ambulatory Visit: Payer: Self-pay

## 2023-05-24 MED ORDER — MAGIC MOUTHWASH W/LIDOCAINE: 5.0000 mL | Freq: Four times a day (QID) | ORAL | 0 refills | Status: DC

## 2023-05-25 ENCOUNTER — Telehealth: Payer: Self-pay

## 2023-05-25 ENCOUNTER — Other Ambulatory Visit: Payer: Self-pay

## 2023-05-25 MED ORDER — MAGIC MOUTHWASH W/LIDOCAINE: 5.0000 mL | Freq: Four times a day (QID) | ORAL | 0 refills | Status: DC

## 2023-05-25 NOTE — Telephone Encounter (Signed)
 Returned call to daughter. [facility] is unable to fill magic wash Rx. Called in magic mouth wash Rx to [facility] in [place], [place]  and spoke with daughter Cheyenne Cotta. She is aware Rx sent and will call the pharmacy.

## 2023-05-28 MED FILL — Fosaprepitant Dimeglumine For IV Infusion 150 MG (Base Eq): INTRAVENOUS | Qty: 5 | Status: AC

## 2023-05-31 ENCOUNTER — Inpatient Hospital Stay

## 2023-05-31 ENCOUNTER — Inpatient Hospital Stay (HOSPITAL_BASED_OUTPATIENT_CLINIC_OR_DEPARTMENT_OTHER): Admitting: Hematology and Oncology

## 2023-05-31 ENCOUNTER — Other Ambulatory Visit: Payer: Self-pay

## 2023-05-31 ENCOUNTER — Encounter: Payer: Self-pay | Admitting: Hematology and Oncology

## 2023-05-31 ENCOUNTER — Other Ambulatory Visit: Payer: Self-pay | Admitting: *Deleted

## 2023-05-31 ENCOUNTER — Other Ambulatory Visit (HOSPITAL_COMMUNITY): Payer: Self-pay

## 2023-05-31 VITALS — BP 96/67 | HR 78 | Temp 99.2°F | Resp 16 | Ht 66.0 in | Wt 148.6 lb

## 2023-05-31 DIAGNOSIS — R634 Abnormal weight loss: Secondary | ICD-10-CM | POA: Insufficient documentation

## 2023-05-31 DIAGNOSIS — C549 Malignant neoplasm of corpus uteri, unspecified: Secondary | ICD-10-CM | POA: Diagnosis not present

## 2023-05-31 DIAGNOSIS — Z5112 Encounter for antineoplastic immunotherapy: Secondary | ICD-10-CM | POA: Diagnosis not present

## 2023-05-31 DIAGNOSIS — D638 Anemia in other chronic diseases classified elsewhere: Secondary | ICD-10-CM | POA: Diagnosis not present

## 2023-05-31 DIAGNOSIS — K1231 Oral mucositis (ulcerative) due to antineoplastic therapy: Secondary | ICD-10-CM | POA: Insufficient documentation

## 2023-05-31 DIAGNOSIS — E876 Hypokalemia: Secondary | ICD-10-CM

## 2023-05-31 LAB — CBC WITH DIFFERENTIAL/PLATELET
Abs Immature Granulocytes: 0.23 10*3/uL — ABNORMAL HIGH (ref 0.00–0.07)
Basophils Absolute: 0.1 10*3/uL (ref 0.0–0.1)
Basophils Relative: 1 %
Eosinophils Absolute: 0 10*3/uL (ref 0.0–0.5)
Eosinophils Relative: 0 %
HCT: 29 % — ABNORMAL LOW (ref 36.0–46.0)
Hemoglobin: 9.2 g/dL — ABNORMAL LOW (ref 12.0–15.0)
Immature Granulocytes: 2 %
Lymphocytes Relative: 9 %
Lymphs Abs: 1.1 10*3/uL (ref 0.7–4.0)
MCH: 29.3 pg (ref 26.0–34.0)
MCHC: 31.7 g/dL (ref 30.0–36.0)
MCV: 92.4 fL (ref 80.0–100.0)
Monocytes Absolute: 1.9 10*3/uL — ABNORMAL HIGH (ref 0.1–1.0)
Monocytes Relative: 16 %
Neutro Abs: 8.7 10*3/uL — ABNORMAL HIGH (ref 1.7–7.7)
Neutrophils Relative %: 72 %
Platelets: 479 10*3/uL — ABNORMAL HIGH (ref 150–400)
RBC: 3.14 MIL/uL — ABNORMAL LOW (ref 3.87–5.11)
RDW: 19.2 % — ABNORMAL HIGH (ref 11.5–15.5)
WBC: 11.9 10*3/uL — ABNORMAL HIGH (ref 4.0–10.5)
nRBC: 0.5 % — ABNORMAL HIGH (ref 0.0–0.2)

## 2023-05-31 LAB — MAGNESIUM: Magnesium: 0.9 mg/dL — CL (ref 1.7–2.4)

## 2023-05-31 LAB — COMPREHENSIVE METABOLIC PANEL WITH GFR
ALT: 8 U/L (ref 0–44)
AST: 26 U/L (ref 15–41)
Albumin: 3.5 g/dL (ref 3.5–5.0)
Alkaline Phosphatase: 48 U/L (ref 38–126)
Anion gap: 11 (ref 5–15)
BUN: 16 mg/dL (ref 8–23)
CO2: 35 mmol/L — ABNORMAL HIGH (ref 22–32)
Calcium: 8.6 mg/dL — ABNORMAL LOW (ref 8.9–10.3)
Chloride: 92 mmol/L — ABNORMAL LOW (ref 98–111)
Creatinine, Ser: 0.99 mg/dL (ref 0.44–1.00)
GFR, Estimated: 60 mL/min — ABNORMAL LOW (ref >60)
Glucose, Bld: 109 mg/dL — ABNORMAL HIGH (ref 70–99)
Potassium: 2.5 mmol/L — CL (ref 3.5–5.1)
Sodium: 138 mmol/L (ref 135–145)
Total Bilirubin: 1.3 mg/dL — ABNORMAL HIGH (ref 0.0–1.2)
Total Protein: 6.7 g/dL (ref 6.5–8.1)

## 2023-05-31 LAB — SAMPLE TO BLOOD BANK

## 2023-05-31 MED ORDER — MAGNESIUM SULFATE 2 GM/50ML IV SOLN
2.0000 g | Freq: Once | INTRAVENOUS | Status: AC
  Administered 2023-05-31: 2 g via INTRAVENOUS
  Filled 2023-05-31: qty 50

## 2023-05-31 MED ORDER — HEPARIN SOD (PORK) LOCK FLUSH 100 UNIT/ML IV SOLN: 500.0000 [IU] | Freq: Once | INTRAVENOUS | Status: DC | PRN

## 2023-05-31 MED ORDER — POTASSIUM CHLORIDE 20 MEQ/15ML (10%) PO SOLN: 20.0000 meq | Freq: Two times a day (BID) | ORAL | 1 refills | Status: DC

## 2023-05-31 MED ORDER — DOXORUBICIN HCL CHEMO IV INJECTION 2 MG/ML
45.0000 mg/m2 | Freq: Once | INTRAVENOUS | Status: AC
  Administered 2023-05-31: 80 mg via INTRAVENOUS
  Filled 2023-05-31: qty 40

## 2023-05-31 MED ORDER — MAGNESIUM OXIDE -MG SUPPLEMENT 400 (240 MG) MG PO TABS
400.0000 mg | ORAL_TABLET | Freq: Two times a day (BID) | ORAL | 1 refills | Status: DC
Start: 2023-05-31 — End: 2023-09-02

## 2023-05-31 MED ORDER — POTASSIUM CHLORIDE CRYS ER 20 MEQ PO TBCR: 20.0000 meq | EXTENDED_RELEASE_TABLET | Freq: Two times a day (BID) | ORAL | 1 refills | Status: DC

## 2023-05-31 MED ORDER — MAGNESIUM OXIDE -MG SUPPLEMENT 400 (240 MG) MG PO TABS: 400.0000 mg | ORAL_TABLET | Freq: Two times a day (BID) | ORAL | 1 refills | Status: DC

## 2023-05-31 MED ORDER — SODIUM CHLORIDE 0.9% FLUSH: 10.0000 mL | INTRAVENOUS | Status: DC | PRN

## 2023-05-31 MED ORDER — SODIUM CHLORIDE 0.9 % IV SOLN
150.0000 mg | Freq: Once | INTRAVENOUS | Status: AC
  Administered 2023-05-31: 150 mg via INTRAVENOUS
  Filled 2023-05-31: qty 150

## 2023-05-31 MED ORDER — NYSTATIN 100000 UNIT/ML MT SUSP
5.0000 mL | Freq: Four times a day (QID) | OROMUCOSAL | 0 refills | Status: DC
  Filled 2023-05-31: qty 240, 12d supply, fill #0

## 2023-05-31 MED ORDER — SODIUM CHLORIDE 0.9 % IV SOLN
INTRAVENOUS | Status: DC
Start: 2023-05-31 — End: 2023-05-31

## 2023-05-31 MED ORDER — DEXAMETHASONE SODIUM PHOSPHATE 10 MG/ML IJ SOLN
10.0000 mg | Freq: Once | INTRAMUSCULAR | Status: AC
  Administered 2023-05-31: 10 mg via INTRAVENOUS
  Filled 2023-05-31: qty 1

## 2023-05-31 MED ORDER — PALONOSETRON HCL INJECTION 0.25 MG/5ML
0.2500 mg | Freq: Once | INTRAVENOUS | Status: AC
  Administered 2023-05-31: 0.25 mg via INTRAVENOUS
  Filled 2023-05-31: qty 5

## 2023-05-31 NOTE — Assessment & Plan Note (Addendum)
 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, recurrent pelvic lymphadenopathy and ascites Progressed on carboplatin , taxol  and Dostarlimab    She is doing very poorly with chemotherapy, complicated by by recent hospitalization, neutropenic fever and severe pancytopenia requiring transfusion support, mucositis and progressive weight loss She continues to desire treatment We will continue treatment with reduced dose and G-CSF support I plan to reduce the dose of her chemotherapy further due to recent significant weight loss I will order CT imaging to be done in 2 weeks for objective assessment of response to therapy and if she has no clinical benefit from chemo, I would recommend transitioning her care to comfort measures at that point in time

## 2023-05-31 NOTE — Assessment & Plan Note (Addendum)
 I fefilled prescription of Magic mouthwash

## 2023-05-31 NOTE — Assessment & Plan Note (Addendum)
 This is due to poor oral intake She will get IV magnesium  and oral replacement therapy

## 2023-05-31 NOTE — Progress Notes (Deleted)
cri 

## 2023-05-31 NOTE — Progress Notes (Signed)
 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, recurrent pelvic lymphadenopathy and ascites Progressed on carboplatin , taxol  and Dostarlimab    She is doing very poorly with chemotherapy, complicated by by recent hospitalization, neutropenic fever and severe pancytopenia requiring transfusion support, mucositis and progressive weight loss She continues to desire treatment We will continue treatment with reduced dose and G-CSF support I plan to reduce the dose of her chemotherapy further due to recent significant weight loss I will order CT imaging to be done in 2 weeks for objective assessment of response to therapy and if she has no clinical benefit from chemo, I would recommend transitioning her care to comfort measures at that point in time Anemia, chronic disease This is likely anemia of chronic disease. The patient denies recent history of bleeding such as epistaxis, hematuria or hematochezia. She is asymptomatic from the anemia. We will observe for now.  She does not require transfusion now. I do not recommend any further work-up at this time.   Weight loss, non-intentional She has lost 40 pounds in the last 3 months due to poor oral intake We discussed risk and benefits of discontinuation of treatment as I suspect the side effects of chemo contributed to her weight loss  but the patient wants to continue on treatment We discussed importance of frequent small meals Hypomagnesemia This is due to poor oral intake She will get IV magnesium  and oral replacement therapy Hypokalemia This is due to poor oral intake I recommend magnesium  and potassium replacement Mucositis due to chemotherapy I fefilled prescription of Magic mouthwash  Orders Placed This Encounter  Procedures   CT ABDOMEN PELVIS W CONTRAST    Standing Status:   Future    Expected Date:   06/14/2023    Expiration Date:   05/30/2024    Scheduling Instructions:     No oral contrast    If indicated for the ordered procedure, I authorize the administration of contrast media per Radiology protocol:   Yes    Does the patient have a contrast media/X-ray dye allergy?:   No    Preferred imaging location?:   [facility]    If indicated for the ordered procedure, I authorize the administration of oral contrast media per Radiology protocol:   No    Reason for no oral contrast::   No need oral contrast   Magnesium     Standing Status:   Future    Number of Occurrences:   1    Expiration Date:   05/30/2024   Magnesium     Standing Status:   Standing    Number of Occurrences:   3    Expiration Date:   05/30/2024     Almeda Jacobs, MD  INTERVAL HISTORY: she returns for treatment follow-up Complications related to previous cycle of chemotherapy included anemia,, mucositis,, weight loss,, and electrolyte imbalance Her oral intake is very poor She continues on progressive weight loss Mucositis is resolving  PHYSICAL EXAMINATION: ECOG PERFORMANCE STATUS:  2 - Symptomatic, <50% confined to bed  Vitals:   05/31/23 1157 05/31/23 1158  BP: (!) 84/63 96/67  Pulse: 78   Resp: 16   Temp: 99.2 F (37.3 C)   SpO2: 95%    Filed Weights   05/31/23 1157  Weight: 148 lb 9.6 oz (67.4 kg)   Oral examination revealed thrush Relevant data reviewed during this visit included CBC, CMP and magnesium 

## 2023-05-31 NOTE — Patient Instructions (Signed)
 [facility] - A DEPT OF [facility]  Discharge Instructions: Thank you for choosing [facility] to provide your oncology and hematology care.   If you have a lab appointment with the Cancer Center, please go directly to the Cancer Center and check in at the registration area.   Wear comfortable clothing and clothing appropriate for easy access to any Portacath or PICC line.   We strive to give you quality time with your provider. You may need to reschedule your appointment if you arrive late (15 or more minutes).  Arriving late affects you and other patients whose appointments are after yours.  Also, if you miss three or more appointments without notifying the office, you may be dismissed from the clinic at the provider's discretion.      For prescription refill requests, have your pharmacy contact our office and allow 72 hours for refills to be completed.    Today you received the following chemotherapy and/or immunotherapy agents: Doxorubicin.       To help prevent nausea and vomiting after your treatment, we encourage you to take your nausea medication as directed.  BELOW ARE SYMPTOMS THAT SHOULD BE REPORTED IMMEDIATELY: *FEVER GREATER THAN 100.4 F (38 C) OR HIGHER *CHILLS OR SWEATING *NAUSEA AND VOMITING THAT IS NOT CONTROLLED WITH YOUR NAUSEA MEDICATION *UNUSUAL SHORTNESS OF BREATH *UNUSUAL BRUISING OR BLEEDING *URINARY PROBLEMS (pain or burning when urinating, or frequent urination) *BOWEL PROBLEMS (unusual diarrhea, constipation, pain near the anus) TENDERNESS IN MOUTH AND THROAT WITH OR WITHOUT PRESENCE OF ULCERS (sore throat, sores in mouth, or a toothache) UNUSUAL RASH, SWELLING OR PAIN  UNUSUAL VAGINAL DISCHARGE OR ITCHING   Items with * indicate a potential emergency and should be followed up as soon as possible or go to the Emergency Department if any problems should occur.  Please show the CHEMOTHERAPY ALERT CARD or  IMMUNOTHERAPY ALERT CARD at check-in to the Emergency Department and triage nurse.  Should you have questions after your visit or need to cancel or reschedule your appointment, please contact [facility] - A DEPT OF [facility]  Dept: [phone]  and follow the prompts.  Office hours are 8:00 a.m. to 4:30 p.m. Monday - Friday. Please note that voicemails left after 4:00 p.m. may not be returned until the following business day.  We are closed weekends and major holidays. You have access to a nurse at all times for urgent questions. Please call the main number to the clinic Dept: [phone] and follow the prompts.   For any non-urgent questions, you may also contact your provider using MyChart. We now offer e-Visits for anyone 33 and older to request care online for non-urgent symptoms. For details visit [email or web address].   Also download the MyChart app! Go to the app store, search "MyChart", open the app, select [place], and log in with your MyChart username and password.  Doxorubicin Injection What is this medication? DOXORUBICIN (dox oh ROO bi sin) treats some types of cancer. It works by slowing down the growth of cancer cells. This medicine may be used for other purposes; ask your health care provider or pharmacist if you have questions. COMMON BRAND NAME(S): Adriamycin, Adriamycin PFS, Adriamycin RDF, Rubex What should I tell my care team before I take this medication? They need to know if you have any of these conditions: Heart disease History of low blood cell levels caused by a medication Liver  disease Recent or ongoing radiation An unusual or allergic reaction to doxorubicin, other medications, foods, dyes, or preservatives If you or your partner are pregnant or trying to get pregnant Breast-feeding How should I use this medication? This medication is injected into a vein. It is given by your care team in a hospital or clinic  setting. Talk to your care team about the use of this medication in children. Special care may be needed. Overdosage: If you think you have taken too much of this medicine contact a poison control center or emergency room at once. NOTE: This medicine is only for you. Do not share this medicine with others. What if I miss a dose? Keep appointments for follow-up doses. It is important not to miss your dose. Call your care team if you are unable to keep an appointment. What may interact with this medication? 6-mercaptopurine Paclitaxel Phenytoin St. John's wort Trastuzumab Verapamil This list may not describe all possible interactions. Give your health care provider a list of all the medicines, herbs, non-prescription drugs, or dietary supplements you use. Also tell them if you smoke, drink alcohol, or use illegal drugs. Some items may interact with your medicine. What should I watch for while using this medication? Your condition will be monitored carefully while you are receiving this medication. You may need blood work while taking this medication. This medication may make you feel generally unwell. This is not uncommon as chemotherapy can affect healthy cells as well as cancer cells. Report any side effects. Continue your course of treatment even though you feel ill unless your care team tells you to stop. There is a maximum amount of this medication you should receive throughout your life. The amount depends on the medical condition being treated and your overall health. Your care team will watch how much of this medication you receive. Tell your care team if you have taken this medication before. Your urine may turn red for a few days after your dose. This is not blood. If your urine is dark or brown, call your care team. In some cases, you may be given additional medications to help with side effects. Follow all directions for their use. This medication may increase your risk of getting an  infection. Call your care team for advice if you get a fever, chills, sore throat, or other symptoms of a cold or flu. Do not treat yourself. Try to avoid being around people who are sick. This medication may increase your risk to bruise or bleed. Call your care team if you notice any unusual bleeding. Talk to your care team about your risk of cancer. You may be more at risk for certain types of cancers if you take this medication. Talk to your care team if you or your partner may be pregnant. Serious birth defects can occur if you take this medication during pregnancy and for 6 months after the last dose. Contraception is recommended while taking this medication and for 6 months after the last dose. Your care team can help you find the option that works for you. If your partner can get pregnant, use a condom while taking this medication and for 6 months after the last dose. Do not breastfeed while taking this medication. This medication may cause infertility. Talk to your care team if you are concerned about your fertility. What side effects may I notice from receiving this medication? Side effects that you should report to your care team as soon as possible: Allergic reactions--skin rash,  itching, hives, swelling of the face, lips, tongue, or throat Heart failure--shortness of breath, swelling of the ankles, feet, or hands, sudden weight gain, unusual weakness or fatigue Heart rhythm changes--fast or irregular heartbeat, dizziness, feeling faint or lightheaded, chest pain, trouble breathing Infection--fever, chills, cough, sore throat, wounds that don't heal, pain or trouble when passing urine, general feeling of discomfort or being unwell Low red blood cell level--unusual weakness or fatigue, dizziness, headache, trouble breathing Painful swelling, warmth, or redness of the skin, blisters or sores at the infusion site Unusual bruising or bleeding Side effects that usually do not require medical  attention (report to your care team if they continue or are bothersome): Diarrhea Hair loss Nausea Pain, redness, or swelling with sores inside the mouth or throat Red urine This list may not describe all possible side effects. Call your doctor for medical advice about side effects. You may report side effects to FDA at 1-800-FDA-1088. Where should I keep my medication? This medication is given in a hospital or clinic. It will not be stored at home. NOTE: This sheet is a summary. It may not cover all possible information. If you have questions about this medicine, talk to your doctor, pharmacist, or health care provider.  2024 Elsevier/Gold Standard (2022-04-30 00:00:00) Potassium Chloride Injection What is this medication? POTASSIUM CHLORIDE (poe TASS i um KLOOR ide) prevents and treats low levels of potassium in your body. Potassium plays an important role in maintaining the health of your kidneys, heart, muscles, and nervous system. This medicine may be used for other purposes; ask your health care provider or pharmacist if you have questions. COMMON BRAND NAME(S): PROAMP What should I tell my care team before I take this medication? They need to know if you have any of these conditions: Addison disease Dehydration Diabetes (high blood sugar) Heart disease High levels of potassium in the blood Irregular heartbeat or rhythm Kidney disease Large areas of burned skin An unusual or allergic reaction to potassium, other medications, foods, dyes, or preservatives Pregnant or trying to get pregnant Breast-feeding How should I use this medication? This medication is injected into a vein. It is given in a hospital or clinic setting. Talk to your care team about the use of this medication in children. Special care may be needed. Overdosage: If you think you have taken too much of this medicine contact a poison control center or emergency room at once. NOTE: This medicine is only for you.  Do not share this medicine with others. What if I miss a dose? This does not apply. This medication is not for regular use. What may interact with this medication? Do not take this medication with any of the following: Certain diuretics, such as spironolactone, triamterene Eplerenone Sodium polystyrene sulfonate This medication may also interact with the following: Certain medications for blood pressure or heart disease, such as lisinopril, losartan, quinapril, valsartan Medications that lower your chance of fighting infection, such as cyclosporine, tacrolimus NSAIDs, medications for pain and inflammation, such as ibuprofen or naproxen Other potassium supplements Salt substitutes This list may not describe all possible interactions. Give your health care provider a list of all the medicines, herbs, non-prescription drugs, or dietary supplements you use. Also tell them if you smoke, drink alcohol, or use illegal drugs. Some items may interact with your medicine. What should I watch for while using this medication? Visit your care team for regular checks on your progress. Tell your care team if your symptoms do not start to get  better or if they get worse. You may need blood work while you are taking this medication. Avoid salt substitutes unless you are told otherwise by your care team. What side effects may I notice from receiving this medication? Side effects that you should report to your care team as soon as possible: Allergic reactions--skin rash, itching, hives, swelling of the face, lips, tongue, or throat High potassium level--muscle weakness, fast or irregular heartbeat Side effects that usually do not require medical attention (report to your care team if they continue or are bothersome): Diarrhea Nausea Stomach pain Vomiting This list may not describe all possible side effects. Call your doctor for medical advice about side effects. You may report side effects to FDA at  1-800-FDA-1088. Where should I keep my medication? This medication is given in a hospital or clinic. It will not be stored at home. NOTE: This sheet is a summary. It may not cover all possible information. If you have questions about this medicine, talk to your doctor, pharmacist, or health care provider.  2024 Elsevier/Gold Standard (2021-08-08 00:00:00)

## 2023-05-31 NOTE — Assessment & Plan Note (Addendum)
 She has lost 40 pounds in the last 3 months due to poor oral intake We discussed risk and benefits of discontinuation of treatment as I suspect the side effects of chemo contributed to her weight loss but the patient wants to continue on treatment We discussed importance of frequent small meals

## 2023-05-31 NOTE — Assessment & Plan Note (Addendum)
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 The patient denies recent history of bleeding such as epistaxis, hematuria or hematochezia. She is asymptomatic from the anemia. We will observe for now.  She does not require transfusion now. I do not recommend any further work-up at this time.   

## 2023-05-31 NOTE — Progress Notes (Signed)
.  CRITICAL VALUE STICKER  CRITICAL VALUE: Magnesium  0.9  RECEIVER (on-site recipient of call): Prentiss Brocks, RN  DATE & TIME NOTIFIED: 05/31/23 at 1307  MESSENGER (representative from lab):Hubbard Mad   MD NOTIFIED: Dr. Marton Sleeper  TIME OF NOTIFICATION: 1309 05/31/23  RESPONSE:  Will order IV magnesium  and increase oral magnesium  to BID.

## 2023-05-31 NOTE — Progress Notes (Signed)
 CRITICAL VALUE STICKER  CRITICAL VALUE: Potassium 2.5  RECEIVER (on-site recipient of call): Prentiss Brocks, RN  DATE & TIME NOTIFIED: 05/31/23 at 1222  MESSENGER (representative from lab): Brenita Callow  MD NOTIFIED: Dr. Marton Sleeper  TIME OF NOTIFICATION:05/31/23 at 1228  RESPONSE: Will review labs, magnesium  added.

## 2023-05-31 NOTE — Progress Notes (Signed)
 Patient potassium 2.5 today and Magnesium  .9. Per Dr Marton Sleeper, 4g of Magnesium  given and ok to proceed with treatment. Prescription for PO magnesium  and potassium sent to patient's preferred pharmacy.

## 2023-05-31 NOTE — Assessment & Plan Note (Addendum)
 This is due to poor oral intake I recommend magnesium  and potassium replacement

## 2023-06-08 ENCOUNTER — Other Ambulatory Visit: Payer: Self-pay | Admitting: Hematology and Oncology

## 2023-06-08 ENCOUNTER — Telehealth: Payer: Self-pay

## 2023-06-08 NOTE — Telephone Encounter (Signed)
 Returned call to daughter. Her Mom is admitted to ICU in [place] at [facility]. Told her Dr. Marton Sleeper canceled her CT scan and all of her appts at [facility] for now. Ask her to call the office when her Mom is d/ced home. Jamal verbalized understanding and appreciated the call.

## 2023-06-14 ENCOUNTER — Ambulatory Visit (HOSPITAL_COMMUNITY)

## 2023-06-15 ENCOUNTER — Other Ambulatory Visit: Payer: Self-pay | Admitting: Hematology and Oncology

## 2023-06-15 ENCOUNTER — Telehealth: Payer: Self-pay

## 2023-06-15 NOTE — Telephone Encounter (Signed)
 Sarah Wu, daughter called and left a message. Sarah Wu is home now. She had a CT while she admitted to [facility].

## 2023-06-15 NOTE — Telephone Encounter (Signed)
 I do not recommend further chemo due to significant side-effects and disease progression seen on recent CT I can do a phone visit with her and her daughter at her convenience I recommend palliative care with hospice

## 2023-06-15 NOTE — Telephone Encounter (Signed)
 Called daughter and given below message. She verbalized understanding and declined appt with Dr. Marton Sleeper. Home health is at the home now and they will follow up with PCP as needed. Daughter said that they read the CT report and will call the office back if appt needed.

## 2023-06-16 ENCOUNTER — Telehealth: Payer: Self-pay

## 2023-06-16 NOTE — Telephone Encounter (Signed)
 Received call from daughter, Sarah Wu asking about mother's prognosis given results that she received yesterday. Advised Sarah Wu this is a conversation she would need to have with Dr Marton Sleeper, and she was offered phone visit once again for 06/18/23 at 0820. She accepted this appt.

## 2023-06-18 ENCOUNTER — Telehealth: Payer: Self-pay

## 2023-06-18 ENCOUNTER — Encounter: Payer: Self-pay | Admitting: Hematology and Oncology

## 2023-06-18 ENCOUNTER — Inpatient Hospital Stay: Attending: Hematology and Oncology | Admitting: Hematology and Oncology

## 2023-06-18 DIAGNOSIS — K5909 Other constipation: Secondary | ICD-10-CM

## 2023-06-18 DIAGNOSIS — Z7189 Other specified counseling: Secondary | ICD-10-CM

## 2023-06-18 DIAGNOSIS — C541 Malignant neoplasm of endometrium: Secondary | ICD-10-CM | POA: Diagnosis present

## 2023-06-18 DIAGNOSIS — C549 Malignant neoplasm of corpus uteri, unspecified: Secondary | ICD-10-CM

## 2023-06-18 NOTE — Assessment & Plan Note (Signed)
 She has hypomagnesemia and hypokalemia due to poor oral intake Recommend she continues on magnesium  and potassium replacement therapy

## 2023-06-18 NOTE — Assessment & Plan Note (Signed)
 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

## 2023-06-18 NOTE — Telephone Encounter (Signed)
 Returned called [facility]. They contacted daughter to admit and they declined hospice due to having home health. Daughter told hospice that she will contact them when they are ready for hospice. FYI

## 2023-06-18 NOTE — Assessment & Plan Note (Signed)
 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, recurrent pelvic lymphadenopathy and ascites Progressed on carboplatin , taxol  and Dostarlimab  and doxorubicin   She is doing very poorly with chemotherapy, complicated by by recent hospitalization, neutropenic fever and severe pancytopenia requiring transfusion support, mucositis and progressive weight loss Unfortunately, she continues to have recurrent admissions to the hospital last month with sepsis and severe electrolyte abnormalities CT imaging done in the outside facility from end of April 2025 show significant disease progression Ultimately, the patient and family agrees to transition her care to palliative care with comfort measures only I recommend referral to home-based palliative care with hospice and they agreed

## 2023-06-18 NOTE — Telephone Encounter (Signed)
 Called daughter regarding referral to hospice. She agreed to referral to [facility]. Called Hospice at [phone] and faxed referral to [phone], received fax confirmation.

## 2023-06-18 NOTE — Progress Notes (Signed)
 HEMATOLOGY-ONCOLOGY ELECTRONIC VISIT PROGRESS NOTE  Patient Care Team: [name], Dayne Even, DO as PCP - General (Family Medicine)  I connected with the patient via telephone conference and verified that I am speaking with the correct person using two identifiers. The patient's location is at home and I am providing care from the [facility] I discussed the limitations, risks, security and privacy concerns of performing an evaluation and management service by e-visits and the availability of in person appointments.  I also discussed with the patient that there may be a patient responsible charge related to this service. The patient expressed understanding and agreed to proceed.   ASSESSMENT & PLAN:  Uterine cancer (HCC) 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, recurrent pelvic lymphadenopathy and ascites Progressed on carboplatin , taxol  and Dostarlimab  and doxorubicin   She is doing very poorly with chemotherapy, complicated by by recent hospitalization, neutropenic fever and severe pancytopenia requiring transfusion support, mucositis and progressive weight loss Unfortunately, she continues to have recurrent admissions to the hospital last month with sepsis and severe electrolyte abnormalities CT imaging done in the outside facility from end of April 2025 show significant disease progression Ultimately, the patient and family agrees to transition her care to palliative care with comfort measures only I recommend referral to home-based palliative care with hospice and they  agreed  Other constipation She continues to have constipation, 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 I recommend the patient to continue on aggressive laxative therapy  Hypomagnesemia She has hypomagnesemia and hypokalemia due to poor oral intake Recommend she continues on magnesium  and potassium replacement therapy  Goals of care, counseling/discussion She has numerous goals of care discussions in the past I discussed prognosis with her family I estimated her prognosis to be measured in months, likely less than 6 months We discussed the role of home-based palliative care with hospice services and they agreed Will make a referral  No orders of the defined types were placed in this encounter.   INTERVAL HISTORY: Please see below for problem oriented charting. The purpose of today's discussion is to discuss goals of care and prognosis The patient was hospitalized for several weeks in the local facility after recent chemotherapy with neutropenic fever and sepsis along with severe electrolyte abnormalities and malnutrition She was subsequently discharged home several days ago and appears to be doing better We discussed the rationale of why further chemotherapy is not recommended The patient and family members ultimately agreed to focus on quality of life and transitioning her care to hospice We discussed recent test results and electrolyte results  SUMMARY OF ONCOLOGIC HISTORY: Oncology History Overview Note  Outside path showed carcinosarcoma, PD-L1 positive, MSI stable Progressed on carboplatin , taxol  and Dostarlimab , doxorubicin  CARIS on sample 12/31/21 MSI stable, IHC proficient MMR, Her2/neu 2+, PR 10%, p53 mutated   Uterine cancer (HCC)  07/30/2021 Initial Diagnosis   She was evaluated for post-menopausal bleeding. She reported having been seen a year previously and having a biopsy that was negative.  On exam in clinic, she was noted to have a mass present within the vagina.  This  was thought to be a prolapsing fibroid and plan was made for exam  under anesthesia, removal prolapsing fibroid with hysteroscopy and D&C   08/11/2021 Pathology Results   PD-L1 [phone]):                                             Does meet FDA-approval, Combined Positive Score (CPS): >=1   Pan-TRK IHC:                                              Tumor cells are Focally Positive for TRK protein expression.   MICROSATELLITE INSTABILITY:             MS-Stable   TUMOR MUTATION BURDEN:                10.8 Muts/Mb TMB-Low   OTHER BIOMARKERS:                              MET amplification PIK3CA amplification MDM2 amplification CCNE1 amplification   Pertinent Negative Biomarkers Evaluated by NGS: ALK ATM BARD1 BRAF BRCA1 BRCA2 CDK12 CHECK1 CHEK2 EGFR  ERBB2 ESR1 FANCL FGFR2 FGFR3 IDH1 IDH2 KIT KRAS NRAS NTRK1 NTRK2 NTRK3 PALB2 PDGFRA RAD51B RAD51C  RAD51D RAD54L RET ROS1   Comment:  The above studies are performed and reported by [facility], [place]) as part of the [employer] (NGS) panel.  Pertinent results are summarized above.  Please see their separate reports for more detailed information  Addendum electronically signed by Gracy Law, MD on 09/03/2021 at  7:37 AM  Final Diagnosis    A.  CERVICAL MASS, EXCISION:       - CONSISTENT WITH CARCINOSARCOMA (HIGH GRADE MALIGNANT BIPHASIC TUMOR)  Electronically signed by Gracy Law, MD on 08/15/2021 at  9:53 AM  Comment    The malignant epithelial component exhibits high-grade cytomorphology and immunostaining pattern consistent with serous carcinoma to include strong positive diffuse reactivity for p53, p16, and vimentin, focal subset CK7 and CK AE1/AE3 expression, with high Ki-67 staining (approximately 60%).  Neuroendocrine differentiation is present (CD56 negative but synaptophysin positive).  The stromal component is composed of malignant mitotically active cells and heterologous elements with focal chondroid differentiation (S100  positive).  SMA appears to decorate rare tumor cells and highlights vascular structures.  The tumor cells are non-reactive for CK20, WT1, GATA3, p40 and desmin.  Block A5 is referred to [facility], [place]) to perform molecular characterization.  These findings are shared with Dr. Melvena Stabler on [id number] at [phone] hours by telephone conversation. This case received prospective intradepartmental quality assurance review.   Clinical Information    Cervical mass  Gross Description    A. Cervix Received fresh labeled with the patient's name and date of birth and "cervical mass frozen section" per container is an aggregate of red-tan friable soft tissue measuring approximately 5 x 5 x 3 cm in toto.  Representative sections are frozen for an intraoperative consultation.  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   Results rendered by Dr. Emory Harps and reported to Dr. Bettylou Brunner at [facility]  on 08/11/2021 at 8:06 AM.  Microscopic Description    Microscopic examination supports the above diagnosis.  Single antibody immunostains are performed to include: S100, p40, p16, p53, PAX8, CK7, CK20, vimentin, CK AE1/3, GATA3, WT1, desmin, SMA, synaptophysin, CD56, and Ki-67.  All controls are appropriately reactive. [id number], [id number], [id number], [id number], [id number], [id number] (15)          08/22/2021 Initial Diagnosis   Carcinosarcoma (HCC)   09/01/2021 Imaging   CT abdomen and pelvis There is marked enlargement of the uterus measuring 13.7 x 10 x 8 x 10.1 cm. There is inhomogeneous enhancement in the uterus consistent with malignant neoplasm.   There are enlarged lymph nodes in the retroperitoneum in the para-and paracaval regions measuring up to 12 mm in short axis. There are a few enlarged  lymph nodes adjacent to the iliac vessels largest measuring 2.6 x 1.4 cm adjacent to the right external iliac vessels. Findings suggest possible metastatic lymphadenopathy. There is mild prominence of the pelvocaliceal system in the left kidney and proximal left ureter. Possibility of extrinsic compression or infiltrative process related to the uterine neoplasm in the distal course of left ureter causing mild obstruction is not excluded.   Gallbladder stone.  Diverticulosis of colon.   Other findings as described in the body of the report.   09/01/2021 Imaging   US  pelvis Large central uterine ill-defined mass approximately 7.7 cm diameter enlarging uterus likely representing the biopsy-proven neoplasm, likely of endometrial origin.   Questionable intramural leiomyoma posterior mid uterus 3.1 cm diameter.   The mass extend and the adnexal regions are poorly characterized but may potentially be better evaluated by MR imaging with and without contrast.   09/10/2021 Surgery   Pre-operative Diagnosis: carcinosarcoma of the uterus, adenopathy on imaging   Post-operative Diagnosis: same, stage IVB carcinosarcoma of the uterus   Operation: Diagnostic laparoscopy    Surgeon: Wiley Hanger MD Operative Findings: On EUA, cervix 2 cm dilated with tumor noted within the os. Cervix otherwise normal appearing. Uterus enlarged, 12 cm, with limited mobility secondary to weight. Compression of the rectum from uterus although no direct invasion appreciated. On intra-abdominal inspection, adhesions noted between the liver and the anterior abdominal wall. Evidence of carcinomatosis involving liver surface, diaphragm, omentum, anterior abdominal wall. Surface of the uterus with obvious tumor infiltration. Small bowel free from uterus. Posteriorly, sigmoid densely adherent to the posterior uterus. Even with manipulator, unable to move the uterus anteriorly. Minimal ascites.    Given findings of stage IV disease  and what would require large bowel resection and end ostomy, decision made to abort surgery in favor of neoadjuvant chemotherapy. I had discussed this as a distinct possibility when we had gotten her CT results as well as this morning prior to the surgery.    09/15/2021 Cancer Staging   Staging form: Corpus Uteri - Carcinoma and Carcinosarcoma, AJCC 8th Edition - Clinical stage from 09/15/2021: FIGO Stage IVB (cT3, cN2a, pM1) - Signed by Almeda Jacobs, MD on 09/15/2021 Stage prefix: Initial diagnosis   09/24/2021 - 03/12/2022 Chemotherapy   Patient is on Treatment Plan : UTERINE ENDOMETRIAL Dostarlimab -gxly (500 mg) + Carboplatin  (AUC 5) + Paclitaxel  (175 mg/m2) q21d x 6 cycles / Dostarlimab -gxly (1000 mg) q42d x 6 cycles      10/17/2021 Procedure   Status post revision of nonfunctional right IJ port catheter, with new right IJ port catheter placed. Port is ready for use.     11/24/2021 Imaging   1. Today's study demonstrates a positive  response to therapy with partial involution of large malignant appearing uterine mass. 2. Multiple prominent borderline enlarged and mildly enlarged retroperitoneal and pelvic lymph nodes are stable to minimally decreased in size compared to the prior study, potentially metastatic. No new lymphadenopathy is noted elsewhere in the chest, abdomen or pelvis. 3. Multiple small pulmonary nodules measuring 5 mm or less in the lungs. These were incidentally imaged on the prior CT of the abdomen and pelvis 09/01/2021, and at this time are stable. The possibility of metastatic disease is not excluded, and close attention on follow-up studies is recommended. 4. Colonic diverticulosis without evidence of acute diverticulitis at this time. 5. Additional incidental findings, as above.     12/31/2021 Surgery   Robotic-assisted laparoscopic total hysterectomy with bilateral salpingo-oophorectomy, lysis of adhesions for approximately 40 minutes, cystoscopy   Findings: On EUA, moderately  mobile enlarged uterus.  On intra-abdominal entry, filmy adhesions between the anterior liver and the diaphragm bilaterally.  No carcinomatosis noted on the diaphragm or liver.  Normal-appearing stomach, small bowel and omentum.  No peritoneal lesions in the lower abdominal cavity or pelvis.  No ascites.  Uterus enlarged measuring approximately 12-14 cm, bulbous at the top.  Posterior 2 cm fibroid near the fundus.  Posterior aspect of the uterus densely adherent to the colon mesentery.  Somewhat dilated fluid-filled left fallopian tube.  Normal-appearing right adnexa.  Left ovary adherent to the broad ligament and sigmoid mesentery but otherwise normal-appearing.  Significant fibrosis bilaterally of the retroperitoneum.  No obvious adenopathy appreciated. On cystoscopy, bladder dome intact, good efflux noted from bilateral ureteral orifices.   12/31/2021 Pathology Results   A. CULDESAC, POSTERIOR ADHESIONS, EXCISION: -  Serosa and subserosal fibrous soft tissue with focal acute inflammation, reactive mesothelial hyperplasia and adhesions. -  Negative for malignancy.  B. UTERUS, CERVIX, BILATERAL TUBE AND OVAARIES, HYSTERECTOMY: - -  Carcinosarcoma (malignant mixed Mullerian tumor) with full-thickness posterior myometrial involvement with focal serosal present and extension into lower uterine segment and extensively in the cervical stroma. 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 Not currently on therapy. * Tracking Code: BO * EXAM: CT CHEST, ABDOMEN, AND PELVIS WITH CONTRAST TECHNIQUE: Multidetector CT imaging of the chest, abdomen and pelvis was performed following the standard protocol during bolus administration of intravenous contrast. RADIATION DOSE REDUCTION: This exam was performed according to the departmental dose-optimization program which includes automated exposure control, adjustment of the mA and/or kV according to patient size and/or use of iterative reconstruction technique. CONTRAST:  100mL OMNIPAQUE   IOHEXOL  300 MG/ML  SOLN COMPARISON:  Multiple priors including CT April 03, 2022 and November 21, 2021 FINDINGS: CT CHEST FINDINGS Cardiovascular: Accessed right chest Port-A-Cath with tip at the superior cavoatrial junction. Normal caliber thoracic aorta. No central pulmonary embolus on this nondedicated study. Normal size heart. No significant pericardial effusion/thickening. Mediastinum/Nodes: No suspicious thyroid  nodule. No pathologically enlarged mediastinal, hilar or axillary lymph nodes. Patulous esophagus. Lungs/Pleura: Scattered bilateral pulmonary nodules are stable from prior examination. No new suspicious pulmonary nodules or masses. For reference: -pulmonary nodule in the medial right lower lobe measuring 5 mm on image 74/4 is  unchanged. -3 mm right middle lobe pulmonary nodule on image 89/4 is unchanged. Musculoskeletal: No aggressive lytic or blastic lesion of bone. Multilevel degenerative change of the spine. CT ABDOMEN PELVIS FINDINGS Hepatobiliary: No suspicious hepatic lesion. Gallbladder is unremarkable. No biliary ductal dilation. Pancreas: No pancreatic ductal dilation or evidence of acute inflammation. Spleen: No splenomegaly. Adrenals/Urinary Tract: Bilateral adrenal glands appear normal. No hydronephrosis. Kidneys demonstrate symmetric enhancement. Urinary bladder is unremarkable for degree of distension. Stomach/Bowel: Radiopaque enteric contrast material traverses the splenic flexure. Stomach is unremarkable for degree of distension. No pathologic dilation of small or large bowel. No evidence of acute bowel inflammation. Vascular/Lymphatic: Normal caliber abdominal aorta. The portal, splenic and superior mesenteric veins are patent. New retroperitoneal, iliac side chain and pelvic sidewall adenopathy. For reference: -retrocaval right upper quadrant lymph node measures 10 mm in short axis on image 65/2 -aortocaval lymph node measures 18 mm in short axis on image 77/2 -left common iliac lymph node measures 2.3 cm in short axis on image 83/2 -left obturator lymph node measures 2.1 cm in short axis on image 94/2. Reproductive: Uterus is surgically absent with a new 2.6 x 2.2 cm nodule near the left vaginal cuff on image 98/2. Other: Mild mesenteric stranding similar prior. No discrete peritoneal or omental nodularity. Nonspecific subcutaneous edema. Musculoskeletal: Round sclerotic osseous lesion in the L1 vertebral body is unchanged. No new aggressive lytic or blastic lesion of bone. Multilevel degenerative changes spine. IMPRESSION: 1. New abdominopelvic adenopathy, compatible with recurrent nodal metastatic disease. 2. New 2.6 cm nodule near the left vaginal cuff, concerning for recurrent local disease versus and adjacent  nodal metastasis. 3. Stable round sclerotic osseous lesion in the L1 vertebral body. 4. Scattered bilateral pulmonary nodules are stable from prior examination. No new suspicious pulmonary nodules or masses. These results will be called to the ordering clinician or representative by the Radiologist Assistant, and communication documented in the PACS or [employer]. Electronically Signed   By: Tama Fails M.D.   On: 10/22/2022 11:37      11/09/2022 - 03/15/2023 Chemotherapy   Patient is on Treatment Plan : UTERINE ENDOMETRIAL Dostarlimab -gxly (500 mg) + Carboplatin  (AUC 5) + Paclitaxel  (175 mg/m2) q21d x 6 cycles / Dostarlimab -gxly (1000 mg) q42d x 6 cycles      01/04/2023 Imaging   CT CHEST ABDOMEN PELVIS W CONTRAST  Result Date: 01/04/2023 CLINICAL DATA:  High-grade tumor assessment endometrial cancer. Assess response to the chemotherapy. * Tracking Code: BO * EXAM: CT CHEST, ABDOMEN, AND PELVIS WITH CONTRAST TECHNIQUE: Multidetector CT imaging of the chest, abdomen and pelvis was performed following the standard protocol during bolus administration of intravenous contrast. RADIATION DOSE REDUCTION: This exam was performed according to the departmental dose-optimization program which includes automated exposure control, adjustment of the mA and/or kV according to patient size and/or use of iterative reconstruction technique. CONTRAST:  100mL OMNIPAQUE  IOHEXOL  300 MG/ML  SOLN COMPARISON:  CT 10/22/2022. FINDINGS: CT CHEST FINDINGS Cardiovascular: Right upper chest port is accessed. Tip seen as far as the central SVC. Heart is nonenlarged. No pericardial effusion. Mild atherosclerotic changes along the aorta. Separate origin of the left vertebral artery directly from the aortic arch, normal variant. Mediastinum/Nodes: Preserved thyroid  gland. Normal caliber thoracic esophagus. No specific abnormal lymph node enlargement identified in the axillary regions, hilum or mediastinum. Small nodes identified  including pre cardiac are stable and not pathologic by size criteria. Lungs/Pleura: No consolidation, pneumothorax or effusion. Stable tiny nodules identified. These include 5 mm nodule left upper lobe series 6, image 40, 3 mm nodule middle lobe series 6, image 97, medial right lower lobe nodule series 6, image 80 measuring 5 mm. No new lung nodularity clearly identified. There is also some nodular which are calcified such as left lower lobe superior segment series 6, image 61. Dependent areas of atelectasis and scarring. Musculoskeletal: Curvature of the spine with some degenerative changes. CT ABDOMEN PELVIS FINDINGS Hepatobiliary: Stones in the gallbladder. Gallbladder is nondilated. Patent portal vein. No space-occupying liver lesion. Pancreas: Unremarkable. No pancreatic ductal dilatation or surrounding inflammatory changes. Spleen: Normal in size without focal abnormality. Adrenals/Urinary Tract: Adrenal glands are preserved. No enhancing renal mass or collecting system dilatation. The ureters have normal course and caliber extending down to the bladder. Slight wall thickening of the urinary bladder, new from previous. There is increasing adjacent stranding as well in the pelvis including extending pelvic sidewall and presacral spaces. Stomach/Bowel: Oral contrast was administered. The stomach is mildly distended with a air and debris. Small bowel is nondilated. Large bowel has a normal course and caliber with moderate stool. Vascular/Lymphatic: Normal caliber aorta and IVC with scattered vascular calcifications. Several abnormal lymph nodes are once again identified along the retroperitoneum and pelvis. Specific lesions will follow up for continuity. These include left common iliac chain node which has a short axis previously of 2.3 cm and today 1.8 cm on series 2, image 85. Retrocaval node which previously had a short axis of 10 mm, today on series 2, image 67 11 mm. Aortocaval node short axis of 18 mm and  today when measured in a similar fashion 14 mm on series 2, image 79. Left obturator node short axis of 21 mm on the prior and today series 2, image 96 of 12 mm. No new nodal enlargement. Increasing retroperitoneal stranding. Reproductive: Absence of the uterus. Soft tissue node in the surgical bed which previously measured 2.6 x 2.2 cm, today is smaller on series 2, image 99 at 2.1 by 1.0 cm. Other: Small umbilical fat containing hernia. Musculoskeletal: Curvature and degenerative changes along the spine. IMPRESSION: Multifocal abnormal lymph node enlargement in the abdomen and pelvis. Many of these smaller. There is increasing adjacent stranding. Decreasing soft tissue nodule in the surgical bed in the low pelvis. No developing new mass lesion, fluid collection or lymph node enlargement. Stable small lung nodules. Electronically Signed   By: Adrianna Horde M.D.   On: 01/04/2023 11:43      03/29/2023 Imaging   CT CHEST ABDOMEN PELVIS W CONTRAST Result Date: 03/29/2023 CLINICAL DATA:  Endometrial cancer, monitor, high-risk. * Tracking Code: BO * EXAM: CT CHEST, ABDOMEN, AND PELVIS WITH CONTRAST TECHNIQUE: Multidetector CT imaging of the chest, abdomen and pelvis was performed following the standard protocol during bolus administration of intravenous contrast. RADIATION DOSE REDUCTION: This exam was performed according to the departmental dose-optimization program which includes automated exposure control, adjustment of  the mA and/or kV according to patient size and/or use of iterative reconstruction technique. CONTRAST:  OMNIPAQUE  IOHEXOL  300 MG/ML  SOLN COMPARISON:  Multiple priors including most recent CT January 04, 2023. FINDINGS: CT CHEST FINDINGS Cardiovascular: Accessed right chest Port-A-Cath with tip near the superior cavoatrial junction. Normal caliber thoracic aorta. Normal size heart. No significant pericardial effusion/thickening. Mediastinum/Nodes: No suspicious thyroid  nodule. No  pathologically enlarged mediastinal, hilar or axillary lymph nodes. The esophagus is grossly unremarkable. Lungs/Pleura: New subsolid 6 mm subpleural right lower lobe pulmonary nodule on image 95/6. Other scattered tiny pulmonary nodules are stable.  For reference: -left upper lobe pulmonary nodule measures 5 mm on image 39/6, unchanged. -right middle lobe pulmonary nodule measures 3 mm on image 95/6. Musculoskeletal: No aggressive lytic or blastic lesion of bone. Multilevel degenerative change of the spine. CT ABDOMEN PELVIS FINDINGS Hepatobiliary: No suspicious hepatic lesion. Cholelithiasis without findings of acute cholecystitis. No biliary ductal dilation. Pancreas: No pancreatic ductal dilation or evidence of acute inflammation. Spleen: No splenomegaly. Adrenals/Urinary Tract: No suspicious adrenal nodule/mass. No hydronephrosis. Kidneys demonstrate symmetric enhancement. Symmetric wall thickening with urothelial hyperenhancement of the urinary bladder. Stomach/Bowel: No evidence of bowel obstruction or acute bowel inflammation. Scattered colonic diverticulosis. Vascular/Lymphatic: Aortic atherosclerosis. Portal, splenic and superior mesenteric veins are patent. Overall increased size of the abdominopelvic lymph nodes. For reference: -left common iliac lymph node measures 19 mm on image 84/2 previously 18 mm -retrocaval lymph node measures 2 cm on image 67/2 previously 11 mm. -aortocaval lymph node measures 15 mm in short axis on image 75/2 previously 14 mm. -left obturator lymph node measures 12 mm in short axis on image 97/2, unchanged. Reproductive: Increased size of the soft tissue nodule in the surgical bed now measuring 17 x 17 mm on image 100/2 previously 2.1 x 1.0 cm Other: Increased abdominopelvic free fluid. Small fat and fluid containing ventral/umbilical hernia. Nonspecific subcutaneous edema. Musculoskeletal: No aggressive lytic or blastic lesion of bone. Decreased density in the sacrum commonly  reflects postradiation change. IMPRESSION: 1. Increased size of the soft tissue nodule in the surgical bed now measuring 17 x 17 mm. 2. Overall increased size of the abdominopelvic lymph nodes. 3. New subsolid 6 mm subpleural right lower lobe pulmonary nodule, nonspecific but favored to reflect an infectious or inflammatory etiology. However this warrants attention on short-term interval follow-up imaging. Other small pulmonary nodules are stable. 4. 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 5. Increased abdominopelvic free fluid. 6.  Aortic Atherosclerosis (ICD10-I70.0). Electronically Signed   By: Tama Fails M.D.   On: 03/29/2023 11:26      04/16/2023 Echocardiogram    1. Left ventricular ejection fraction, by estimation, is 60 to 65%. The left ventricle has normal function. The left ventricle has no regional wall motion abnormalities. Left ventricular diastolic parameters are consistent with Grade I diastolic  dysfunction (impaired relaxation).  2. Right ventricular systolic function is normal. The right ventricular size is normal.  3. The mitral valve is normal in structure. No evidence of mitral valve regurgitation. No evidence of mitral stenosis.  4. The aortic valve is normal in structure. Aortic valve regurgitation is mild. No aortic stenosis is present.  5. The inferior vena cava is normal in size with greater than 50% respiratory variability, suggesting right atrial pressure of 3 mmHg.   04/19/2023 - 05/31/2023 Chemotherapy   Patient is on Treatment Plan : BREAST Doxorubicin  (60) q21d     04/29/2023 Imaging  1.  Cholelithiasis without evidence of acute cholecystitis.  2.  Fluid in nondistended loops of small bowel and colon, suggesting diarrhea.  3.  Thick-walled urinary bladder may be inflammatory or related to treatment.  4.  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     06/05/2023 Imaging   Sigmoid colon wall thickening with fluid mixture contents throughout the colon indicative of an underlying colitis.   Significant progression of disease of retroperitoneal lymphadenopathy with bulky nodal masses throughout the retroperitoneum in the periaortic and aortocaval region. The largest masses measure 4.2 cm. Previous nodal masses measured up to 9-10 mm.   Interval development of 2.3 cm mass along the leftward aspect of the rectum. This is likely a metastatic implant although a primary colonic neoplasm is not excluded. Correlate with clinical history given patient's history of cancer   12 mm filling defect within the gallbladder likely cholelithiasis.     I discussed the assessment and treatment plan with the patient. The patient was provided an opportunity to ask questions and all were answered. The patient agreed with the plan and demonstrated an understanding of the instructions. The patient was advised to call back or seek an in-person evaluation if the symptoms worsen or if the condition fails to improve as anticipated.    I spent 40 minutes for the appointment reviewing test results, discuss management and coordination of care.  Almeda Jacobs, MD 06/18/2023 8:44 AM

## 2023-06-18 NOTE — Assessment & Plan Note (Signed)
 She has numerous goals of care discussions in the past I discussed prognosis with her family I estimated her prognosis to be measured in months, likely less than 6 months We discussed the role of home-based palliative care with hospice services and they agreed Will make a referral

## 2023-06-21 ENCOUNTER — Other Ambulatory Visit

## 2023-06-21 ENCOUNTER — Ambulatory Visit

## 2023-06-21 ENCOUNTER — Ambulatory Visit: Admitting: Hematology and Oncology

## 2023-07-30 ENCOUNTER — Other Ambulatory Visit: Payer: Self-pay | Admitting: Hematology and Oncology

## 2023-09-02 ENCOUNTER — Telehealth: Payer: Self-pay | Admitting: *Deleted

## 2023-09-02 NOTE — Telephone Encounter (Signed)
 Daughter called to let Dr Lonn and Dr Viktoria know that Sarah Wu passed last night .

## 2023-09-10 DEATH — deceased
